# Patient Record
Sex: Female | Born: 1935 | Race: White | Hispanic: No | Marital: Married | Smoking: Never smoker
Health system: Southern US, Community
[De-identification: ages and names within clinical notes are randomized; demographics above are authoritative.]

## PROBLEM LIST (undated history)

## (undated) DIAGNOSIS — I82409 Acute embolism and thrombosis of unspecified deep veins of unspecified lower extremity: Secondary | ICD-10-CM

## (undated) DIAGNOSIS — D649 Anemia, unspecified: Secondary | ICD-10-CM

## (undated) DIAGNOSIS — I509 Heart failure, unspecified: Secondary | ICD-10-CM

## (undated) DIAGNOSIS — I10 Essential (primary) hypertension: Secondary | ICD-10-CM

## (undated) DIAGNOSIS — I4891 Unspecified atrial fibrillation: Secondary | ICD-10-CM

## (undated) DIAGNOSIS — I499 Cardiac arrhythmia, unspecified: Secondary | ICD-10-CM

## (undated) DIAGNOSIS — E785 Hyperlipidemia, unspecified: Secondary | ICD-10-CM

## (undated) DIAGNOSIS — E079 Disorder of thyroid, unspecified: Secondary | ICD-10-CM

## (undated) DIAGNOSIS — E039 Hypothyroidism, unspecified: Secondary | ICD-10-CM

## (undated) HISTORY — DX: Essential (primary) hypertension: I10

## (undated) HISTORY — PX: VAGINAL HYSTERECTOMY: SUR661

## (undated) HISTORY — DX: Acute embolism and thrombosis of unspecified deep veins of unspecified lower extremity: I82.409

## (undated) HISTORY — PX: APPENDECTOMY: SHX54

## (undated) HISTORY — DX: Hyperlipidemia, unspecified: E78.5

## (undated) HISTORY — DX: Anemia, unspecified: D64.9

## (undated) HISTORY — DX: Unspecified atrial fibrillation: I48.91

---

## 1997-08-03 ENCOUNTER — Ambulatory Visit (HOSPITAL_COMMUNITY): Admission: RE | Admit: 1997-08-03 | Discharge: 1997-08-03 | Payer: Self-pay | Admitting: Internal Medicine

## 1998-08-09 ENCOUNTER — Encounter: Payer: Self-pay | Admitting: Internal Medicine

## 1998-08-09 ENCOUNTER — Ambulatory Visit (HOSPITAL_COMMUNITY): Admission: RE | Admit: 1998-08-09 | Discharge: 1998-08-09 | Payer: Self-pay | Admitting: Internal Medicine

## 1999-08-10 ENCOUNTER — Encounter: Payer: Self-pay | Admitting: Internal Medicine

## 1999-08-10 ENCOUNTER — Ambulatory Visit (HOSPITAL_COMMUNITY): Admission: RE | Admit: 1999-08-10 | Discharge: 1999-08-10 | Payer: Self-pay | Admitting: Internal Medicine

## 1999-09-10 ENCOUNTER — Other Ambulatory Visit: Admission: RE | Admit: 1999-09-10 | Discharge: 1999-09-10 | Payer: Self-pay | Admitting: Otolaryngology

## 2000-08-22 ENCOUNTER — Ambulatory Visit (HOSPITAL_COMMUNITY): Admission: RE | Admit: 2000-08-22 | Discharge: 2000-08-22 | Payer: Self-pay | Admitting: Internal Medicine

## 2000-08-22 ENCOUNTER — Encounter: Payer: Self-pay | Admitting: Internal Medicine

## 2001-08-28 ENCOUNTER — Ambulatory Visit (HOSPITAL_COMMUNITY): Admission: RE | Admit: 2001-08-28 | Discharge: 2001-08-28 | Payer: Self-pay | Admitting: Internal Medicine

## 2001-08-28 ENCOUNTER — Encounter: Payer: Self-pay | Admitting: Internal Medicine

## 2002-09-14 ENCOUNTER — Ambulatory Visit (HOSPITAL_COMMUNITY): Admission: RE | Admit: 2002-09-14 | Discharge: 2002-09-14 | Payer: Self-pay | Admitting: Internal Medicine

## 2002-09-14 ENCOUNTER — Encounter: Payer: Self-pay | Admitting: Internal Medicine

## 2002-12-31 ENCOUNTER — Ambulatory Visit (HOSPITAL_COMMUNITY): Admission: RE | Admit: 2002-12-31 | Discharge: 2002-12-31 | Payer: Self-pay | Admitting: Gastroenterology

## 2003-09-16 ENCOUNTER — Ambulatory Visit (HOSPITAL_COMMUNITY): Admission: RE | Admit: 2003-09-16 | Discharge: 2003-09-16 | Payer: Self-pay | Admitting: Internal Medicine

## 2003-11-10 ENCOUNTER — Ambulatory Visit (HOSPITAL_COMMUNITY): Admission: RE | Admit: 2003-11-10 | Discharge: 2003-11-10 | Payer: Self-pay | Admitting: Internal Medicine

## 2004-09-21 ENCOUNTER — Ambulatory Visit (HOSPITAL_COMMUNITY): Admission: RE | Admit: 2004-09-21 | Discharge: 2004-09-21 | Payer: Self-pay | Admitting: Geriatric Medicine

## 2005-09-11 ENCOUNTER — Ambulatory Visit: Payer: Self-pay | Admitting: Cardiology

## 2005-09-13 ENCOUNTER — Ambulatory Visit: Payer: Self-pay | Admitting: Cardiology

## 2005-09-27 ENCOUNTER — Ambulatory Visit (HOSPITAL_COMMUNITY): Admission: RE | Admit: 2005-09-27 | Discharge: 2005-09-27 | Payer: Self-pay | Admitting: Urology

## 2005-10-09 ENCOUNTER — Ambulatory Visit: Payer: Self-pay | Admitting: Cardiology

## 2006-10-03 ENCOUNTER — Ambulatory Visit (HOSPITAL_COMMUNITY): Admission: RE | Admit: 2006-10-03 | Discharge: 2006-10-03 | Payer: Self-pay | Admitting: Geriatric Medicine

## 2007-10-09 ENCOUNTER — Ambulatory Visit (HOSPITAL_COMMUNITY): Admission: RE | Admit: 2007-10-09 | Discharge: 2007-10-09 | Payer: Self-pay | Admitting: Geriatric Medicine

## 2007-10-26 ENCOUNTER — Other Ambulatory Visit: Admission: RE | Admit: 2007-10-26 | Discharge: 2007-10-26 | Payer: Self-pay | Admitting: Obstetrics and Gynecology

## 2008-10-14 ENCOUNTER — Ambulatory Visit (HOSPITAL_COMMUNITY): Admission: RE | Admit: 2008-10-14 | Discharge: 2008-10-14 | Payer: Self-pay | Admitting: Geriatric Medicine

## 2008-10-27 ENCOUNTER — Other Ambulatory Visit: Admission: RE | Admit: 2008-10-27 | Discharge: 2008-10-27 | Payer: Self-pay | Admitting: Obstetrics and Gynecology

## 2009-10-20 ENCOUNTER — Ambulatory Visit (HOSPITAL_COMMUNITY): Admission: RE | Admit: 2009-10-20 | Discharge: 2009-10-20 | Payer: Self-pay | Admitting: Geriatric Medicine

## 2010-01-21 HISTORY — PX: OTHER SURGICAL HISTORY: SHX169

## 2010-06-08 NOTE — Assessment & Plan Note (Signed)
[facility]                              CARDIOLOGY OFFICE NOTE   Paula [name], Paula [name]                    MRN:          [id number]  DATE:09/11/2005                            DOB:          [birth year]    PRIMARY CARE PHYSICIAN:  Dr. Merlene Laughter.   REASON FOR PRESENTATION:  Patient with hypertension.   HISTORY OF PRESENT ILLNESS:  Patient called to add herself to the schedule  as a new patient today.  She is 75 years old.  She has not had any cardiac  problems in the past.  However, she just did not feel well yesterday.  She  did not go to the [facility] to exercise because she was fatigued.  She felt like  maybe she had a head cold.  She has had a cough for 2 or 3 days, it had been  non-productive.  She has not had any new shortness of breath.  She has had  no PND, or orthopnea.  She has not noticed any palpitations.  She has no  chest discomfort, neck discomfort, arm discomfort, activity induced nausea,  vomiting, excessive diaphoresis.   However, because she did not feel well she took her blood pressure.  She  noted it to be in the 190s/100 range.  She came in today with her mother-in-  law who I see frequently.  She had her blood pressure verified by our  nurses.  She is now added to my schedule.  She had no other symptoms as  above.   PAST MEDICAL HISTORY:  Hypothyroidism.   PAST SURGICAL HISTORY:  Appendectomy, hysterectomy.   ALLERGIES:  ADHESIVES.   CURRENT MEDICATIONS:  1. Synthroid 0.05 mg q. day.  2. Fish oil 1200 mg q. day.  3. Flax-seed oil.  4. Calcium.  5. Aspirin 81 mg q. day.  6. Omeprazole.   SOCIAL HISTORY:  This patient is retired.  She is married with just one  child.  She does not drink alcohol except for a glass of wine occasionally.  She does not smoke cigarettes.   FAMILY HISTORY:  Non-contributory for early coronary artery disease.  Her  mother did die of a probable myocardial infarction at age 49.   REVIEW OF  SYSTEMS:  As stated in the HPI.  Positive for reflux, ulcer,  hiatal hernia.  Negative for other systems.   PHYSICAL EXAMINATION:  GENERAL:  The patient is in no distress.  VITAL SIGNS:  Blood pressure 192/92, heart beat 89 and regular.  Weight 148  pounds.  Body mass index 25.  HEENT:  Eyes unremarkable, pupils equal, round, reactive to light.  Fundi  within normal limits.  Oral mucosa unremarkable.  NECK:  No jugular venous distention.  Waveform within normal limits, carotid  upstroke brisk and symmetric.  No bruits, thyromegaly.  LYMPHATICS:  No cervical, axillary, or inguinal adenopathy.  LUNGS:  Clear to auscultation bilaterally.  BACK:  No costovertebral midline tenderness.  CHEST:  Unremarkable.  HEART:  PMI not displaced or sustained.  S1 and S2 within normal limits.  No  S3, no S4.  No  murmurs.  ABDOMEN:  Flat, positive bowel sounds, normal in frequency and pitch, no  bruits, no rebound, guarding or midline pulsatile mass.  No hepatomegaly or  splenomegaly.  SKIN:  No rashes, no nodules.  EXTREMITIES:  2+ pulses throughout.  No edema.  No cyanosis, no clubbing.  NEURO:  Oriented to person, place, time.  Cranial nerves II-XII grossly  intact, motor grossly intact.   EKG:  Sinus rhythm, rate 89, short P-R interval, nonspecific T-wave  flattening.   ASSESSMENT/PLAN:  1. Hypertension.  Patient has new-onset hypertension.  She is otherwise      doing relatively well except for some mild congestion in her head and a      very slight cough.  Her physical exam is unremarkable.  She does not      appear to have any acute secondary causes.  Her blood pressure is equal      in both arms.  However, it has been verified several times today as she      came here this morning with her mother-in-law.  It is still elevated.      She might be somewhat anxious about this which may be driving the      pressure.  However, I do think that at this point I am obligated to      treat it even  though it is a first time reading.  I am going to add      Norvasc 2.5 mg q. day.  She is going to keep a blood pressure diary and      let me know how this responds.  I am also going to get blood work to      include a CMET, TSH and urinalysis.  2. Followup.  I would like to see her back in four weeks.                               Rollene Rotunda, MD, [facility]    JH/MedQ  DD:  09/11/2005  DT:  09/12/2005  Job #:  [id number]   cc:   Hal T. Pete Glatter, MD

## 2010-06-08 NOTE — Op Note (Signed)
NAME:  Paula [name], ARRAMBIDE                       ACCOUNT NO.:  [id number]   MEDICAL RECORD NO.:  [id number]                   PATIENT TYPE:  AMB   LOCATION:  ENDO                                 FACILITY:  [facility]   PHYSICIAN:  Danise Edge, M.D.                DATE OF BIRTH:  23-Jun-1935   DATE OF PROCEDURE:  12/31/2002  DATE OF DISCHARGE:                                 OPERATIVE REPORT   PROCEDURE:  Screening colonoscopy.   INDICATIONS FOR PROCEDURE:  [name] is a 75 year old female born  01-21-1936.  Ms. Chauca is scheduled to undergo her first screening  colonoscopy with polypectomy to prevent colon cancer.   ENDOSCOPIST:  Danise Edge, M.D.   PREMEDICATION:  Versed 5 mg, Demerol 50 mg.   DESCRIPTION OF PROCEDURE:  After obtaining informed consent, Mr. Eagles was  placed in the left lateral decubitus position. I administered intravenous  Demerol and intravenous Versed to achieve conscious sedation for the  procedure. The patient's blood pressure, oxygen saturation and cardiac  rhythm were monitored throughout the procedure and documented in the medical  record.   Anal inspection was normal. Digital rectal exam was normal. The Olympus  adjustable pediatric colonoscope was introduced into the rectum and advanced  to the cecum. Colonic preparation for the exam today was satisfactory.   RECTUM:  Normal.   SIGMOID COLON AND DESCENDING COLON:  Normal.   SPLENIC FLEXURE:  Normal.   TRANSVERSE COLON:  Normal.   HEPATIC FLEXURE:  Normal.   ASCENDING COLON:  Normal.   CECUM AND ILEOCECAL VALVE:  Normal.   ASSESSMENT:  Normal screening proctocolonoscopy to the cecum.  No endoscopic  evidence for the presence of colorectal neoplasia.                                               Danise Edge, M.D.    MJ/MEDQ  D:  12/31/2002  T:  01/01/2003  Job:  [id number]   cc:   Ike Bene, M.D.  [address]  [place]  [place] [address]  Fax:  [phone]

## 2010-06-08 NOTE — Assessment & Plan Note (Signed)
[facility]                              CARDIOLOGY OFFICE NOTE   Paula, [name]                    MRN:          [id number]  DATE:10/09/2005                            DOB:          [birth year]    PRIMARY CARE PHYSICIAN:  Hal T. Stoneking, M.D.   REASON FOR PRESENTATION:  Evaluate patient with hypertension.   HISTORY OF PRESENT ILLNESS:  Patient presents for followup.  I saw her with  exacerbation of blood pressure that was new to her.  This was on August  22nd.  I started her on Norvasc 2.5 mg daily.  She has tolerated this  medicine.  She has done well with this and has had no recurrent symptoms.  In particular, she has had no headaches or dizziness.  She did have blood  work that included a normal chemistry and urinalysis.  Her TSH was also  normal.   PAST MEDICAL HISTORY:  1. Hypothyroidism.  2. New hypertension.  3. Appendectomy.  4. Hysterectomy.   ALLERGIES:  ADHESIVES.   MEDICATIONS:  1. Synthroid 0.05 mg daily.  2. Fish oil.  3. Flaxseed oil.  4. Calcium plus.  5. Aspirin 81 mg daily.  6. Norvasc 2.5 mg daily.  7. Omeprazole.   REVIEW OF SYSTEMS:  As stated in the HPI, otherwise negative for other  systems.   PHYSICAL EXAMINATION:  GENERAL:  Patient is in no distress.  VITAL SIGNS:  Blood pressure 140/78, heart rate 67 and regular.  HEENT:  Eyelids unremarkable.  Pupils are equal, round and reactive to  light.  Fundi no visualized.  Oral mucosa unremarkable.  NECK:  No jugular venous distention.  Wave form within normal limits.  Carotid upstroke brisk and symmetric.  No bruits, no thyromegaly.  LYMPHATICS:  No cervical, axillary, or inguinal adenopathy.  LUNGS:  Clear to auscultation.  HEART:  PMI not displaced or sustained.  S1 and S2 within normal limits.  No  S3, no S4, no murmurs.  ABDOMEN:  Flat, positive bowel sounds.  Normal in frequency and pitch.  No  bruits, rebound, guarding.  There are no midline  pulsatile masses.  No  organomegaly.  SKIN:  No rashes, no nodules.  EXTREMITIES:  Pulses 2+ throughout.  No cyanosis, no clubbing, no edema.  NEUROLOGIC:  Grossly intact.   ASSESSMENT/PLAN:  1. Hypertension:  The patient does not appear to have a secondary cause.      Her blood pressure seems to be well controlled, though at the upper      limits of normal today.  She is tolerating the Norvasc.  At this point,      she will continue this with no further cardiovascular testing.  We      discussed therapeutic lifestyle changes (TLC) that can aid with blood      pressure control.  2. Followup:  She is due to see Dr. Pete Glatter soon.  She can come back to      this clinic as needed.  Rollene Rotunda, MD, [facility]    JH/MedQ  DD:  10/09/2005  DT:  10/10/2005  Job #:  [id number]   cc:   Hal T. Stoneking, M.D.

## 2010-09-27 ENCOUNTER — Other Ambulatory Visit (HOSPITAL_COMMUNITY): Payer: Self-pay | Admitting: Geriatric Medicine

## 2010-09-27 DIAGNOSIS — Z1231 Encounter for screening mammogram for malignant neoplasm of breast: Secondary | ICD-10-CM

## 2010-09-28 ENCOUNTER — Other Ambulatory Visit (HOSPITAL_COMMUNITY)
Admission: RE | Admit: 2010-09-28 | Discharge: 2010-09-28 | Disposition: A | Discharge status: Home / Self Care | Payer: PRIVATE HEALTH INSURANCE | Source: Ambulatory Visit | Attending: Otolaryngology | Admitting: Otolaryngology

## 2010-09-28 DIAGNOSIS — R220 Localized swelling, mass and lump, head: Secondary | ICD-10-CM | POA: Insufficient documentation

## 2010-10-11 ENCOUNTER — Other Ambulatory Visit: Payer: Self-pay | Admitting: Otolaryngology

## 2010-10-11 DIAGNOSIS — D37039 Neoplasm of uncertain behavior of the major salivary glands, unspecified: Secondary | ICD-10-CM

## 2010-10-15 ENCOUNTER — Ambulatory Visit
Admission: RE | Admit: 2010-10-15 | Discharge: 2010-10-15 | Disposition: A | Discharge status: Home / Self Care | Payer: PRIVATE HEALTH INSURANCE | Source: Ambulatory Visit | Attending: Otolaryngology | Admitting: Otolaryngology

## 2010-10-15 DIAGNOSIS — D37039 Neoplasm of uncertain behavior of the major salivary glands, unspecified: Secondary | ICD-10-CM

## 2010-10-15 MED ORDER — IOHEXOL 300 MG/ML  SOLN
75.0000 mL | Freq: Once | INTRAMUSCULAR | Status: AC | PRN
  Administered 2010-10-15: 75 mL via INTRAVENOUS

## 2010-10-22 ENCOUNTER — Ambulatory Visit (HOSPITAL_COMMUNITY)
Admission: RE | Admit: 2010-10-22 | Discharge: 2010-10-22 | Disposition: A | Discharge status: Home / Self Care | Payer: PRIVATE HEALTH INSURANCE | Source: Ambulatory Visit | Attending: Geriatric Medicine | Admitting: Geriatric Medicine

## 2010-10-22 DIAGNOSIS — Z1231 Encounter for screening mammogram for malignant neoplasm of breast: Secondary | ICD-10-CM | POA: Insufficient documentation

## 2011-01-31 ENCOUNTER — Other Ambulatory Visit: Payer: Self-pay | Admitting: Orthopedic Surgery

## 2011-01-31 DIAGNOSIS — M25562 Pain in left knee: Secondary | ICD-10-CM

## 2011-02-01 ENCOUNTER — Ambulatory Visit
Admission: RE | Admit: 2011-02-01 | Discharge: 2011-02-01 | Disposition: A | Discharge status: Home / Self Care | Payer: PRIVATE HEALTH INSURANCE | Source: Ambulatory Visit | Attending: Orthopedic Surgery | Admitting: Orthopedic Surgery

## 2011-02-01 DIAGNOSIS — M25562 Pain in left knee: Secondary | ICD-10-CM

## 2011-09-26 ENCOUNTER — Other Ambulatory Visit (HOSPITAL_COMMUNITY): Payer: Self-pay | Admitting: Geriatric Medicine

## 2011-09-26 DIAGNOSIS — Z1231 Encounter for screening mammogram for malignant neoplasm of breast: Secondary | ICD-10-CM

## 2011-10-24 ENCOUNTER — Ambulatory Visit (HOSPITAL_COMMUNITY)
Admission: RE | Admit: 2011-10-24 | Discharge: 2011-10-24 | Disposition: A | Discharge status: Home / Self Care | Payer: Medicare Other | Source: Ambulatory Visit | Attending: Geriatric Medicine | Admitting: Geriatric Medicine

## 2011-10-24 DIAGNOSIS — Z1231 Encounter for screening mammogram for malignant neoplasm of breast: Secondary | ICD-10-CM | POA: Insufficient documentation

## 2012-09-23 ENCOUNTER — Other Ambulatory Visit (HOSPITAL_COMMUNITY): Payer: Self-pay | Admitting: Geriatric Medicine

## 2012-09-23 DIAGNOSIS — Z1231 Encounter for screening mammogram for malignant neoplasm of breast: Secondary | ICD-10-CM

## 2012-10-28 ENCOUNTER — Ambulatory Visit (HOSPITAL_COMMUNITY)
Admission: RE | Admit: 2012-10-28 | Discharge: 2012-10-28 | Disposition: A | Discharge status: Home / Self Care | Payer: Medicare Other | Source: Ambulatory Visit | Attending: Geriatric Medicine | Admitting: Geriatric Medicine

## 2012-10-28 DIAGNOSIS — Z1231 Encounter for screening mammogram for malignant neoplasm of breast: Secondary | ICD-10-CM | POA: Insufficient documentation

## 2013-09-21 ENCOUNTER — Other Ambulatory Visit (HOSPITAL_COMMUNITY): Payer: Self-pay | Admitting: Geriatric Medicine

## 2013-09-21 DIAGNOSIS — Z803 Family history of malignant neoplasm of breast: Secondary | ICD-10-CM

## 2013-11-08 ENCOUNTER — Other Ambulatory Visit (HOSPITAL_COMMUNITY): Payer: Self-pay | Admitting: Geriatric Medicine

## 2013-11-08 ENCOUNTER — Ambulatory Visit (HOSPITAL_COMMUNITY)
Admission: RE | Admit: 2013-11-08 | Discharge: 2013-11-08 | Disposition: A | Discharge status: Home / Self Care | Payer: Medicare HMO | Source: Ambulatory Visit | Attending: Geriatric Medicine | Admitting: Geriatric Medicine

## 2013-11-08 DIAGNOSIS — Z1231 Encounter for screening mammogram for malignant neoplasm of breast: Secondary | ICD-10-CM

## 2013-11-08 DIAGNOSIS — Z803 Family history of malignant neoplasm of breast: Secondary | ICD-10-CM

## 2014-05-16 DIAGNOSIS — Z79899 Other long term (current) drug therapy: Secondary | ICD-10-CM | POA: Diagnosis not present

## 2014-05-16 DIAGNOSIS — E780 Pure hypercholesterolemia: Secondary | ICD-10-CM | POA: Diagnosis not present

## 2014-07-15 DIAGNOSIS — I10 Essential (primary) hypertension: Secondary | ICD-10-CM | POA: Diagnosis not present

## 2014-07-15 DIAGNOSIS — M8589 Other specified disorders of bone density and structure, multiple sites: Secondary | ICD-10-CM | POA: Diagnosis not present

## 2014-07-15 DIAGNOSIS — M720 Palmar fascial fibromatosis [Dupuytren]: Secondary | ICD-10-CM | POA: Diagnosis not present

## 2014-07-18 DIAGNOSIS — Z961 Presence of intraocular lens: Secondary | ICD-10-CM | POA: Diagnosis not present

## 2014-07-18 DIAGNOSIS — H52222 Regular astigmatism, left eye: Secondary | ICD-10-CM | POA: Diagnosis not present

## 2014-07-18 DIAGNOSIS — H524 Presbyopia: Secondary | ICD-10-CM | POA: Diagnosis not present

## 2014-07-18 DIAGNOSIS — H5210 Myopia, unspecified eye: Secondary | ICD-10-CM | POA: Diagnosis not present

## 2014-08-09 DIAGNOSIS — L570 Actinic keratosis: Secondary | ICD-10-CM | POA: Diagnosis not present

## 2014-08-15 DIAGNOSIS — M8589 Other specified disorders of bone density and structure, multiple sites: Secondary | ICD-10-CM | POA: Diagnosis not present

## 2014-08-15 DIAGNOSIS — M899 Disorder of bone, unspecified: Secondary | ICD-10-CM | POA: Diagnosis not present

## 2014-08-22 DIAGNOSIS — M65331 Trigger finger, right middle finger: Secondary | ICD-10-CM | POA: Diagnosis not present

## 2014-08-22 DIAGNOSIS — M65332 Trigger finger, left middle finger: Secondary | ICD-10-CM | POA: Diagnosis not present

## 2014-09-22 DIAGNOSIS — M65331 Trigger finger, right middle finger: Secondary | ICD-10-CM | POA: Diagnosis not present

## 2014-09-22 DIAGNOSIS — M65332 Trigger finger, left middle finger: Secondary | ICD-10-CM | POA: Diagnosis not present

## 2014-10-17 ENCOUNTER — Other Ambulatory Visit: Payer: Self-pay

## 2014-10-17 DIAGNOSIS — Z1231 Encounter for screening mammogram for malignant neoplasm of breast: Secondary | ICD-10-CM

## 2014-11-10 DIAGNOSIS — L900 Lichen sclerosus et atrophicus: Secondary | ICD-10-CM | POA: Diagnosis not present

## 2014-11-17 ENCOUNTER — Ambulatory Visit
Admission: RE | Admit: 2014-11-17 | Discharge: 2014-11-17 | Disposition: A | Discharge status: Home / Self Care | Payer: Commercial Managed Care - HMO | Source: Ambulatory Visit

## 2014-11-17 DIAGNOSIS — Z1231 Encounter for screening mammogram for malignant neoplasm of breast: Secondary | ICD-10-CM | POA: Diagnosis not present

## 2015-01-25 DIAGNOSIS — M65331 Trigger finger, right middle finger: Secondary | ICD-10-CM | POA: Diagnosis not present

## 2015-01-25 DIAGNOSIS — M65332 Trigger finger, left middle finger: Secondary | ICD-10-CM | POA: Diagnosis not present

## 2015-01-30 DIAGNOSIS — E039 Hypothyroidism, unspecified: Secondary | ICD-10-CM | POA: Diagnosis not present

## 2015-01-30 DIAGNOSIS — Z0000 Encounter for general adult medical examination without abnormal findings: Secondary | ICD-10-CM | POA: Diagnosis not present

## 2015-01-30 DIAGNOSIS — Z79899 Other long term (current) drug therapy: Secondary | ICD-10-CM | POA: Diagnosis not present

## 2015-01-30 DIAGNOSIS — Z23 Encounter for immunization: Secondary | ICD-10-CM | POA: Diagnosis not present

## 2015-01-30 DIAGNOSIS — Z1389 Encounter for screening for other disorder: Secondary | ICD-10-CM | POA: Diagnosis not present

## 2015-01-30 DIAGNOSIS — I10 Essential (primary) hypertension: Secondary | ICD-10-CM | POA: Diagnosis not present

## 2015-01-30 DIAGNOSIS — E7800 Pure hypercholesterolemia, unspecified: Secondary | ICD-10-CM | POA: Diagnosis not present

## 2015-02-07 DIAGNOSIS — E039 Hypothyroidism, unspecified: Secondary | ICD-10-CM | POA: Diagnosis not present

## 2015-02-28 DIAGNOSIS — M65332 Trigger finger, left middle finger: Secondary | ICD-10-CM | POA: Diagnosis not present

## 2015-02-28 DIAGNOSIS — M65331 Trigger finger, right middle finger: Secondary | ICD-10-CM | POA: Diagnosis not present

## 2015-03-06 ENCOUNTER — Encounter (HOSPITAL_COMMUNITY): Payer: Self-pay | Admitting: Emergency Medicine

## 2015-03-06 ENCOUNTER — Emergency Department (HOSPITAL_COMMUNITY)
Admission: EM | Admit: 2015-03-06 | Discharge: 2015-03-06 | Disposition: A | Discharge status: Home / Self Care | Payer: Commercial Managed Care - HMO | Attending: Emergency Medicine | Admitting: Emergency Medicine

## 2015-03-06 ENCOUNTER — Emergency Department (HOSPITAL_COMMUNITY): Payer: Commercial Managed Care - HMO

## 2015-03-06 DIAGNOSIS — E079 Disorder of thyroid, unspecified: Secondary | ICD-10-CM | POA: Insufficient documentation

## 2015-03-06 DIAGNOSIS — Z7982 Long term (current) use of aspirin: Secondary | ICD-10-CM | POA: Insufficient documentation

## 2015-03-06 DIAGNOSIS — Z79899 Other long term (current) drug therapy: Secondary | ICD-10-CM | POA: Insufficient documentation

## 2015-03-06 DIAGNOSIS — R55 Syncope and collapse: Secondary | ICD-10-CM | POA: Insufficient documentation

## 2015-03-06 DIAGNOSIS — R61 Generalized hyperhidrosis: Secondary | ICD-10-CM | POA: Insufficient documentation

## 2015-03-06 DIAGNOSIS — R531 Weakness: Secondary | ICD-10-CM | POA: Diagnosis not present

## 2015-03-06 DIAGNOSIS — Z8679 Personal history of other diseases of the circulatory system: Secondary | ICD-10-CM | POA: Insufficient documentation

## 2015-03-06 DIAGNOSIS — R05 Cough: Secondary | ICD-10-CM | POA: Diagnosis not present

## 2015-03-06 HISTORY — DX: Disorder of thyroid, unspecified: E07.9

## 2015-03-06 HISTORY — DX: Cardiac arrhythmia, unspecified: I49.9

## 2015-03-06 LAB — URINE MICROSCOPIC-ADD ON

## 2015-03-06 LAB — BASIC METABOLIC PANEL
Anion gap: 13 (ref 5–15)
BUN: 11 mg/dL (ref 6–20)
CALCIUM: 8.7 mg/dL — AB (ref 8.9–10.3)
CHLORIDE: 96 mmol/L — AB (ref 101–111)
CO2: 22 mmol/L (ref 22–32)
CREATININE: 0.9 mg/dL (ref 0.44–1.00)
GFR calc Af Amer: >60 mL/min (ref >60)
GFR calc non Af Amer: >60 mL/min (ref >60)
GLUCOSE: 120 mg/dL — AB (ref 65–99)
Potassium: 4.4 mmol/L (ref 3.5–5.1)
Sodium: 131 mmol/L — ABNORMAL LOW (ref 135–145)

## 2015-03-06 LAB — URINALYSIS, ROUTINE W REFLEX MICROSCOPIC
BILIRUBIN URINE: NEGATIVE
GLUCOSE, UA: NEGATIVE mg/dL
HGB URINE DIPSTICK: NEGATIVE
Ketones, ur: 15 mg/dL — AB
Leukocytes, UA: NEGATIVE
Nitrite: NEGATIVE
Protein, ur: 30 mg/dL — AB
SPECIFIC GRAVITY, URINE: 1.023 (ref 1.005–1.030)
pH: 7 (ref 5.0–8.0)

## 2015-03-06 LAB — CBC
HCT: 42.8 % (ref 36.0–46.0)
HEMOGLOBIN: 13.8 g/dL (ref 12.0–15.0)
MCH: 30.9 pg (ref 26.0–34.0)
MCHC: 32.2 g/dL (ref 30.0–36.0)
MCV: 95.7 fL (ref 78.0–100.0)
PLATELETS: 201 10*3/uL (ref 150–400)
RBC: 4.47 MIL/uL (ref 3.87–5.11)
RDW: 13.3 % (ref 11.5–15.5)
WBC: 10.2 10*3/uL (ref 4.0–10.5)

## 2015-03-06 LAB — CBG MONITORING, ED: GLUCOSE-CAPILLARY: 106 mg/dL — AB (ref 65–99)

## 2015-03-06 LAB — I-STAT TROPONIN, ED: Troponin i, poc: 0 ng/mL (ref 0.00–0.08)

## 2015-03-06 MED ORDER — SODIUM CHLORIDE 0.9 % IV BOLUS (SEPSIS)
1000.0000 mL | Freq: Once | INTRAVENOUS | Status: AC
  Administered 2015-03-06: 1000 mL via INTRAVENOUS

## 2015-03-06 NOTE — ED Notes (Signed)
EKG completed given to EDP.  

## 2015-03-06 NOTE — ED Notes (Signed)
Onset today syncope x2 standing both times witnessed by husband syncope lasting 1 minute each time reported by husband to EMS. Denies any trauma alert answering and following commands appropriate upon arrival.

## 2015-03-06 NOTE — ED Provider Notes (Signed)
CSN: [id number]     Arrival date & time 03/06/15  0930 History   First MD Initiated Contact with Patient 03/06/15 1006     Chief Complaint  Patient presents with  . Loss of Consciousness     (Consider location/radiation/quality/duration/timing/severity/associated sxs/prior Treatment) Patient is a 80 y.o. female presenting with syncope.  Loss of Consciousness  Paula [name] is a 80 y.o. female with PMH significant for thyroid disease who presents with 2 syncopal episodes this morning each lasting approximately 1 minute. Asian states that she was standing in her kitchen this morning when she suddenly came dizzy, diaphoretic, and generally weak and then remembers waking up on the kitchen floor. The first episode was unwitnessed. The second episode occurred when she was standing and she became dizzy, diaphoretic and generally weak again and her husband assisted her to the floor. He reports she was slightly disoriented upon wakening. Denies bowel or bladder incontinence or shaking. She denies any recent medication changes. No history of seizures. She states that she has been sick with a "bug" this past weekend with symptoms including sore throat, cough, low-grade fever, generalized headache, chest tightness, and nausea. She reports decreased by mouth intake. She denies shortness of breath, vomiting, urinary symptoms, or bloody stools.   Past Medical History  Diagnosis Date  . Irregular heart rate   . Thyroid disease    History reviewed. No pertinent past surgical history. No family history on file. Social History  Substance Use Topics  . Smoking status: Never Smoker   . Smokeless tobacco: None  . Alcohol Use: Yes   OB History    No data available     Review of Systems  Cardiovascular: Positive for syncope.  All other systems negative unless otherwise stated in HPI     Allergies  Review of patient's allergies indicates no known allergies.  Home Medications   Prior to  Admission medications   Medication Sig Start Date End Date Taking? Authorizing Provider  amLODipine (NORVASC) 5 MG tablet Take 5 mg by mouth daily.   Yes Historical Provider, MD  aspirin EC 81 MG tablet Take 81 mg by mouth daily.   Yes Historical Provider, MD  atorvastatin (LIPITOR) 10 MG tablet Take 10 mg by mouth daily at 6 PM.   Yes Historical Provider, MD  calcium carbonate (OS-CAL - DOSED IN MG OF ELEMENTAL CALCIUM) 1250 (500 Ca) MG tablet Take 1 tablet by mouth daily with breakfast.   Yes Historical Provider, MD  levothyroxine (SYNTHROID, LEVOTHROID) 88 MCG tablet Take 88 mcg by mouth daily before breakfast.   Yes Historical Provider, MD  Multiple Vitamin (MULTIVITAMIN WITH MINERALS) TABS tablet Take 1 tablet by mouth daily.   Yes Historical Provider, MD   BP 90/62 mmHg  Pulse 90  Temp(Src) 98.8 F (37.1 C) (Oral)  Resp 18  Ht 5\' 4"  (1.626 m)  Wt 63.05 kg  BMI 23.85 kg/m2  SpO2 98% Physical Exam  Constitutional: She is oriented to person, place, and time. She appears well-developed and well-nourished.  Non-toxic appearance. She does not have a sickly appearance. She does not appear ill.  HENT:  Head: Normocephalic and atraumatic.  Mouth/Throat: Oropharynx is clear and moist.  Eyes: Conjunctivae are normal. Pupils are equal, round, and reactive to light.  Neck: Normal range of motion. Neck supple.  No cervical midline tenderness.   Cardiovascular: Normal rate, regular rhythm and normal heart sounds.   No murmur heard. Pulmonary/Chest: Effort normal and breath sounds normal. No accessory muscle  usage or stridor. No respiratory distress. She has no wheezes. She has no rhonchi. She has no rales.  Abdominal: Soft. Bowel sounds are normal. She exhibits no distension. There is no tenderness.  Musculoskeletal: Normal range of motion.  Lymphadenopathy:    She has no cervical adenopathy.  Neurological: She is alert and oriented to person, place, and time.  Mental Status:   AOx3.   Speech clear without dysarthria. Cranial Nerves:  I-not tested  II-PERRLA  III, IV, VI-EOMs intact  V-temporal and masseter strength intact  VII-symmetrical facial movements intact, no facial droop  VIII-hearing grossly intact bilaterally  IX, X-gag intact  XI-strength of sternomastoid and trapezius muscles 5/5  XII-tongue midline Motor:   Good muscle bulk and tone  Strength 5/5 bilaterally in upper and lower extremities   Cerebellar--intact RAMs, finger to nose intact bilaterally.  Gait normal  No pronator drift Sensory:  Intact in upper and lower extremities   Skin: Skin is warm and dry.  No signs of trauma.   Psychiatric: She has a normal mood and affect. Her behavior is normal.    ED Course  Procedures (including critical care time) Labs Review Labs Reviewed  BASIC METABOLIC PANEL - Abnormal; Notable for the following:    Sodium 131 (*)    Chloride 96 (*)    Glucose, Bld 120 (*)    Calcium 8.7 (*)    All other components within normal limits  CBG MONITORING, ED - Abnormal; Notable for the following:    Glucose-Capillary 106 (*)    All other components within normal limits  CBC  URINALYSIS, ROUTINE W REFLEX MICROSCOPIC (NOT AT [facility]  Randolm Idol, ED    Imaging Review Dg Chest 2 View  03/06/2015  CLINICAL DATA:  Cough and syncope EXAM: CHEST  2 VIEW COMPARISON:  None. FINDINGS: Normal heart size and mediastinal contours. No acute infiltrate or edema. No effusion or pneumothorax. Upper thoracic compression fracture with moderate height loss, likely T5. History favors chronicity. IMPRESSION: No evidence of acute cardiopulmonary disease. Electronically Signed   By: Monte Fantasia M.D.   On: 03/06/2015 10:59   I have personally reviewed and evaluated these images and lab results as part of my medical decision-making.   EKG Interpretation None      MDM   Final diagnoses:  Syncope, unspecified syncope type    Patient presents with 2 syncopal episodes this  morning. VSS, NAD. Normal neuro exam. Lantus obtain chest x-ray, troponin, BNP, CBC, UA, CBG, and EKG.  CXR normal. EKG without acute changes.  Labs without acute abnormalities. I suspect this is related to decreased PO intake over the past couple of days.  Doubt ACS, arrhythmia, or aortic dissection.  Doubt stroke/TIA.  Doubt PE.  Low risk syncope per [facility] Syncope Rule. Will administer IVF and ambualte.  Anticipate discharge home with PCP follow up.  Discussed return precautions.  Patient agrees and acknowledges the above plan for discharge.  Case has been discussed with and seen Dr. Venora Maples who agrees with the above plan for discharge.        Gloriann Loan, PA-C 03/06/15 1342  Gloriann Loan, PA-C 03/06/15 [place], MD 03/06/15 [phone]

## 2015-03-06 NOTE — Discharge Instructions (Signed)

## 2015-03-06 NOTE — ED Notes (Signed)
While ambulating pt, pt's O2 was between 98% and 100%. Pt stated that "she feels much better". Informed Hannah - RN.

## 2015-03-09 DIAGNOSIS — I10 Essential (primary) hypertension: Secondary | ICD-10-CM | POA: Diagnosis not present

## 2015-03-09 DIAGNOSIS — R55 Syncope and collapse: Secondary | ICD-10-CM | POA: Diagnosis not present

## 2015-03-09 DIAGNOSIS — E871 Hypo-osmolality and hyponatremia: Secondary | ICD-10-CM | POA: Diagnosis not present

## 2015-07-28 DIAGNOSIS — R69 Illness, unspecified: Secondary | ICD-10-CM | POA: Diagnosis not present

## 2015-07-28 DIAGNOSIS — H3589 Other specified retinal disorders: Secondary | ICD-10-CM | POA: Diagnosis not present

## 2015-07-28 DIAGNOSIS — Z961 Presence of intraocular lens: Secondary | ICD-10-CM | POA: Diagnosis not present

## 2015-07-31 DIAGNOSIS — I10 Essential (primary) hypertension: Secondary | ICD-10-CM | POA: Diagnosis not present

## 2015-08-08 DIAGNOSIS — L570 Actinic keratosis: Secondary | ICD-10-CM | POA: Diagnosis not present

## 2015-08-08 DIAGNOSIS — Z85828 Personal history of other malignant neoplasm of skin: Secondary | ICD-10-CM | POA: Diagnosis not present

## 2015-10-23 ENCOUNTER — Other Ambulatory Visit: Payer: Self-pay | Admitting: Geriatric Medicine

## 2015-10-23 DIAGNOSIS — Z1231 Encounter for screening mammogram for malignant neoplasm of breast: Secondary | ICD-10-CM

## 2015-10-23 DIAGNOSIS — Z803 Family history of malignant neoplasm of breast: Secondary | ICD-10-CM

## 2015-11-14 DIAGNOSIS — L900 Lichen sclerosus et atrophicus: Secondary | ICD-10-CM | POA: Diagnosis not present

## 2015-11-14 DIAGNOSIS — Z01419 Encounter for gynecological examination (general) (routine) without abnormal findings: Secondary | ICD-10-CM | POA: Diagnosis not present

## 2015-11-20 ENCOUNTER — Ambulatory Visit
Admission: RE | Admit: 2015-11-20 | Discharge: 2015-11-20 | Disposition: A | Discharge status: Home / Self Care | Payer: Commercial Managed Care - HMO | Source: Ambulatory Visit | Attending: Geriatric Medicine | Admitting: Geriatric Medicine

## 2015-11-20 DIAGNOSIS — Z1231 Encounter for screening mammogram for malignant neoplasm of breast: Secondary | ICD-10-CM | POA: Diagnosis not present

## 2015-11-20 DIAGNOSIS — Z803 Family history of malignant neoplasm of breast: Secondary | ICD-10-CM

## 2016-03-14 DIAGNOSIS — Z0000 Encounter for general adult medical examination without abnormal findings: Secondary | ICD-10-CM | POA: Diagnosis not present

## 2016-03-14 DIAGNOSIS — E7800 Pure hypercholesterolemia, unspecified: Secondary | ICD-10-CM | POA: Diagnosis not present

## 2016-03-14 DIAGNOSIS — Z1389 Encounter for screening for other disorder: Secondary | ICD-10-CM | POA: Diagnosis not present

## 2016-03-14 DIAGNOSIS — Z23 Encounter for immunization: Secondary | ICD-10-CM | POA: Diagnosis not present

## 2016-03-14 DIAGNOSIS — K219 Gastro-esophageal reflux disease without esophagitis: Secondary | ICD-10-CM | POA: Diagnosis not present

## 2016-03-14 DIAGNOSIS — I10 Essential (primary) hypertension: Secondary | ICD-10-CM | POA: Diagnosis not present

## 2016-03-14 DIAGNOSIS — M8580 Other specified disorders of bone density and structure, unspecified site: Secondary | ICD-10-CM | POA: Diagnosis not present

## 2016-03-14 DIAGNOSIS — E039 Hypothyroidism, unspecified: Secondary | ICD-10-CM | POA: Diagnosis not present

## 2016-03-15 DIAGNOSIS — I10 Essential (primary) hypertension: Secondary | ICD-10-CM | POA: Diagnosis not present

## 2016-03-15 DIAGNOSIS — E039 Hypothyroidism, unspecified: Secondary | ICD-10-CM | POA: Diagnosis not present

## 2016-03-15 DIAGNOSIS — E7800 Pure hypercholesterolemia, unspecified: Secondary | ICD-10-CM | POA: Diagnosis not present

## 2016-03-15 DIAGNOSIS — Z79899 Other long term (current) drug therapy: Secondary | ICD-10-CM | POA: Diagnosis not present

## 2016-07-05 DIAGNOSIS — R0789 Other chest pain: Secondary | ICD-10-CM | POA: Diagnosis not present

## 2016-07-05 DIAGNOSIS — K219 Gastro-esophageal reflux disease without esophagitis: Secondary | ICD-10-CM | POA: Diagnosis not present

## 2016-07-05 DIAGNOSIS — I10 Essential (primary) hypertension: Secondary | ICD-10-CM | POA: Diagnosis not present

## 2016-07-08 ENCOUNTER — Other Ambulatory Visit: Payer: Self-pay | Admitting: Geriatric Medicine

## 2016-07-08 DIAGNOSIS — R0789 Other chest pain: Secondary | ICD-10-CM

## 2016-07-10 ENCOUNTER — Telehealth (HOSPITAL_COMMUNITY): Payer: Self-pay | Admitting: *Deleted

## 2016-07-10 NOTE — Telephone Encounter (Signed)
Left message on voicemail in reference to upcoming appointment scheduled for 07/12/16. Phone number given for a call back so details instructions can be given.  Paula [name]

## 2016-07-12 ENCOUNTER — Ambulatory Visit (HOSPITAL_COMMUNITY): Payer: Medicare HMO | Attending: Cardiovascular Disease

## 2016-07-12 DIAGNOSIS — R0789 Other chest pain: Secondary | ICD-10-CM | POA: Diagnosis not present

## 2016-07-12 DIAGNOSIS — I2510 Atherosclerotic heart disease of native coronary artery without angina pectoris: Secondary | ICD-10-CM | POA: Insufficient documentation

## 2016-07-12 DIAGNOSIS — R079 Chest pain, unspecified: Secondary | ICD-10-CM | POA: Insufficient documentation

## 2016-07-12 DIAGNOSIS — I10 Essential (primary) hypertension: Secondary | ICD-10-CM | POA: Diagnosis not present

## 2016-07-12 LAB — MYOCARDIAL PERFUSION IMAGING
CHL CUP NUCLEAR SRS: 2
CHL CUP NUCLEAR SSS: 2
CSEPEDS: 0 s
CSEPEW: 7 METS
CSEPHR: 94 %
CSEPPHR: 134 {beats}/min
Exercise duration (min): 6 min
LV dias vol: 80 mL (ref 46–106)
LV sys vol: 29 mL
MPHR: 142 {beats}/min
RATE: 0.25
Rest HR: 73 {beats}/min
SDS: 0
TID: 1.19

## 2016-07-12 MED ORDER — TECHNETIUM TC 99M TETROFOSMIN IV KIT
10.2000 | PACK | Freq: Once | INTRAVENOUS | Status: AC | PRN
  Administered 2016-07-12: 10.2 via INTRAVENOUS
  Filled 2016-07-12: qty 11

## 2016-07-12 MED ORDER — TECHNETIUM TC 99M TETROFOSMIN IV KIT
30.8000 | PACK | Freq: Once | INTRAVENOUS | Status: AC | PRN
  Administered 2016-07-12: 30.8 via INTRAVENOUS
  Filled 2016-07-12: qty 31

## 2016-08-06 DIAGNOSIS — Z85828 Personal history of other malignant neoplasm of skin: Secondary | ICD-10-CM | POA: Diagnosis not present

## 2016-08-06 DIAGNOSIS — L570 Actinic keratosis: Secondary | ICD-10-CM | POA: Diagnosis not present

## 2016-08-08 DIAGNOSIS — H5210 Myopia, unspecified eye: Secondary | ICD-10-CM | POA: Diagnosis not present

## 2016-08-08 DIAGNOSIS — I10 Essential (primary) hypertension: Secondary | ICD-10-CM | POA: Diagnosis not present

## 2016-09-10 DIAGNOSIS — K219 Gastro-esophageal reflux disease without esophagitis: Secondary | ICD-10-CM | POA: Diagnosis not present

## 2016-09-10 DIAGNOSIS — I10 Essential (primary) hypertension: Secondary | ICD-10-CM | POA: Diagnosis not present

## 2016-09-12 DIAGNOSIS — M8588 Other specified disorders of bone density and structure, other site: Secondary | ICD-10-CM | POA: Diagnosis not present

## 2016-10-07 DIAGNOSIS — Z23 Encounter for immunization: Secondary | ICD-10-CM | POA: Diagnosis not present

## 2016-10-07 DIAGNOSIS — I10 Essential (primary) hypertension: Secondary | ICD-10-CM | POA: Diagnosis not present

## 2016-10-07 DIAGNOSIS — M810 Age-related osteoporosis without current pathological fracture: Secondary | ICD-10-CM | POA: Diagnosis not present

## 2016-10-21 ENCOUNTER — Other Ambulatory Visit: Payer: Self-pay | Admitting: Geriatric Medicine

## 2016-10-21 DIAGNOSIS — Z1231 Encounter for screening mammogram for malignant neoplasm of breast: Secondary | ICD-10-CM

## 2016-11-14 DIAGNOSIS — L900 Lichen sclerosus et atrophicus: Secondary | ICD-10-CM | POA: Diagnosis not present

## 2016-11-22 ENCOUNTER — Ambulatory Visit
Admission: RE | Admit: 2016-11-22 | Discharge: 2016-11-22 | Disposition: A | Discharge status: Home / Self Care | Payer: Medicare HMO | Source: Ambulatory Visit | Attending: Geriatric Medicine | Admitting: Geriatric Medicine

## 2016-11-22 DIAGNOSIS — Z1231 Encounter for screening mammogram for malignant neoplasm of breast: Secondary | ICD-10-CM

## 2016-12-06 DIAGNOSIS — L299 Pruritus, unspecified: Secondary | ICD-10-CM | POA: Diagnosis not present

## 2016-12-06 DIAGNOSIS — M8580 Other specified disorders of bone density and structure, unspecified site: Secondary | ICD-10-CM | POA: Diagnosis not present

## 2016-12-06 DIAGNOSIS — I10 Essential (primary) hypertension: Secondary | ICD-10-CM | POA: Diagnosis not present

## 2017-03-19 DIAGNOSIS — J309 Allergic rhinitis, unspecified: Secondary | ICD-10-CM | POA: Diagnosis not present

## 2017-03-19 DIAGNOSIS — Z0000 Encounter for general adult medical examination without abnormal findings: Secondary | ICD-10-CM | POA: Diagnosis not present

## 2017-03-19 DIAGNOSIS — Z1389 Encounter for screening for other disorder: Secondary | ICD-10-CM | POA: Diagnosis not present

## 2017-03-19 DIAGNOSIS — E7800 Pure hypercholesterolemia, unspecified: Secondary | ICD-10-CM | POA: Diagnosis not present

## 2017-03-19 DIAGNOSIS — E039 Hypothyroidism, unspecified: Secondary | ICD-10-CM | POA: Diagnosis not present

## 2017-03-19 DIAGNOSIS — M8580 Other specified disorders of bone density and structure, unspecified site: Secondary | ICD-10-CM | POA: Diagnosis not present

## 2017-03-19 DIAGNOSIS — Z1211 Encounter for screening for malignant neoplasm of colon: Secondary | ICD-10-CM | POA: Diagnosis not present

## 2017-03-19 DIAGNOSIS — I10 Essential (primary) hypertension: Secondary | ICD-10-CM | POA: Diagnosis not present

## 2017-03-19 DIAGNOSIS — Z79899 Other long term (current) drug therapy: Secondary | ICD-10-CM | POA: Diagnosis not present

## 2017-04-29 DIAGNOSIS — M1711 Unilateral primary osteoarthritis, right knee: Secondary | ICD-10-CM | POA: Diagnosis not present

## 2017-05-27 DIAGNOSIS — M25561 Pain in right knee: Secondary | ICD-10-CM | POA: Diagnosis not present

## 2017-05-27 DIAGNOSIS — M1711 Unilateral primary osteoarthritis, right knee: Secondary | ICD-10-CM | POA: Diagnosis not present

## 2017-07-30 DIAGNOSIS — M25561 Pain in right knee: Secondary | ICD-10-CM | POA: Diagnosis not present

## 2017-08-04 DIAGNOSIS — D1801 Hemangioma of skin and subcutaneous tissue: Secondary | ICD-10-CM | POA: Diagnosis not present

## 2017-08-04 DIAGNOSIS — Z85828 Personal history of other malignant neoplasm of skin: Secondary | ICD-10-CM | POA: Diagnosis not present

## 2017-08-04 DIAGNOSIS — L570 Actinic keratosis: Secondary | ICD-10-CM | POA: Diagnosis not present

## 2017-08-12 DIAGNOSIS — Z961 Presence of intraocular lens: Secondary | ICD-10-CM | POA: Diagnosis not present

## 2017-08-12 DIAGNOSIS — H35363 Drusen (degenerative) of macula, bilateral: Secondary | ICD-10-CM | POA: Diagnosis not present

## 2017-09-17 DIAGNOSIS — Z79899 Other long term (current) drug therapy: Secondary | ICD-10-CM | POA: Diagnosis not present

## 2017-09-17 DIAGNOSIS — I10 Essential (primary) hypertension: Secondary | ICD-10-CM | POA: Diagnosis not present

## 2017-10-16 ENCOUNTER — Other Ambulatory Visit: Payer: Self-pay | Admitting: Geriatric Medicine

## 2017-10-16 DIAGNOSIS — Z1231 Encounter for screening mammogram for malignant neoplasm of breast: Secondary | ICD-10-CM

## 2017-10-31 ENCOUNTER — Ambulatory Visit: Payer: Medicare HMO

## 2017-12-02 ENCOUNTER — Ambulatory Visit
Admission: RE | Admit: 2017-12-02 | Discharge: 2017-12-02 | Disposition: A | Discharge status: Home / Self Care | Payer: Medicare HMO | Source: Ambulatory Visit | Attending: Geriatric Medicine | Admitting: Geriatric Medicine

## 2017-12-02 DIAGNOSIS — Z1231 Encounter for screening mammogram for malignant neoplasm of breast: Secondary | ICD-10-CM

## 2017-12-24 DIAGNOSIS — Z01419 Encounter for gynecological examination (general) (routine) without abnormal findings: Secondary | ICD-10-CM | POA: Diagnosis not present

## 2017-12-24 DIAGNOSIS — L900 Lichen sclerosus et atrophicus: Secondary | ICD-10-CM | POA: Diagnosis not present

## 2018-01-03 DIAGNOSIS — N390 Urinary tract infection, site not specified: Secondary | ICD-10-CM | POA: Diagnosis not present

## 2018-01-03 DIAGNOSIS — R319 Hematuria, unspecified: Secondary | ICD-10-CM | POA: Diagnosis not present

## 2018-01-03 DIAGNOSIS — E860 Dehydration: Secondary | ICD-10-CM | POA: Diagnosis not present

## 2018-01-03 DIAGNOSIS — R42 Dizziness and giddiness: Secondary | ICD-10-CM | POA: Diagnosis not present

## 2018-03-23 ENCOUNTER — Other Ambulatory Visit: Payer: Self-pay | Admitting: Geriatric Medicine

## 2018-03-23 ENCOUNTER — Ambulatory Visit
Admission: RE | Admit: 2018-03-23 | Discharge: 2018-03-23 | Disposition: A | Discharge status: Home / Self Care | Payer: Medicare HMO | Source: Ambulatory Visit | Attending: Geriatric Medicine | Admitting: Geriatric Medicine

## 2018-03-23 DIAGNOSIS — Z1389 Encounter for screening for other disorder: Secondary | ICD-10-CM | POA: Diagnosis not present

## 2018-03-23 DIAGNOSIS — K219 Gastro-esophageal reflux disease without esophagitis: Secondary | ICD-10-CM | POA: Diagnosis not present

## 2018-03-23 DIAGNOSIS — R062 Wheezing: Secondary | ICD-10-CM | POA: Diagnosis not present

## 2018-03-23 DIAGNOSIS — I700 Atherosclerosis of aorta: Secondary | ICD-10-CM | POA: Diagnosis not present

## 2018-03-23 DIAGNOSIS — E039 Hypothyroidism, unspecified: Secondary | ICD-10-CM | POA: Diagnosis not present

## 2018-03-23 DIAGNOSIS — Z79899 Other long term (current) drug therapy: Secondary | ICD-10-CM | POA: Diagnosis not present

## 2018-03-23 DIAGNOSIS — R05 Cough: Secondary | ICD-10-CM | POA: Diagnosis not present

## 2018-03-23 DIAGNOSIS — Z0000 Encounter for general adult medical examination without abnormal findings: Secondary | ICD-10-CM | POA: Diagnosis not present

## 2018-03-23 DIAGNOSIS — I10 Essential (primary) hypertension: Secondary | ICD-10-CM | POA: Diagnosis not present

## 2018-03-23 DIAGNOSIS — E7800 Pure hypercholesterolemia, unspecified: Secondary | ICD-10-CM | POA: Diagnosis not present

## 2018-06-09 DIAGNOSIS — M1711 Unilateral primary osteoarthritis, right knee: Secondary | ICD-10-CM | POA: Diagnosis not present

## 2018-08-05 DIAGNOSIS — Z85828 Personal history of other malignant neoplasm of skin: Secondary | ICD-10-CM | POA: Diagnosis not present

## 2018-08-05 DIAGNOSIS — L570 Actinic keratosis: Secondary | ICD-10-CM | POA: Diagnosis not present

## 2018-08-05 DIAGNOSIS — D1801 Hemangioma of skin and subcutaneous tissue: Secondary | ICD-10-CM | POA: Diagnosis not present

## 2018-08-21 DIAGNOSIS — R05 Cough: Secondary | ICD-10-CM | POA: Diagnosis not present

## 2018-09-08 DIAGNOSIS — R55 Syncope and collapse: Secondary | ICD-10-CM | POA: Diagnosis not present

## 2018-09-08 DIAGNOSIS — I10 Essential (primary) hypertension: Secondary | ICD-10-CM | POA: Diagnosis not present

## 2018-09-16 DIAGNOSIS — Z79899 Other long term (current) drug therapy: Secondary | ICD-10-CM | POA: Diagnosis not present

## 2018-09-16 DIAGNOSIS — R42 Dizziness and giddiness: Secondary | ICD-10-CM | POA: Diagnosis not present

## 2018-09-16 DIAGNOSIS — R55 Syncope and collapse: Secondary | ICD-10-CM | POA: Diagnosis not present

## 2018-09-16 DIAGNOSIS — I10 Essential (primary) hypertension: Secondary | ICD-10-CM | POA: Diagnosis not present

## 2018-10-12 DIAGNOSIS — R55 Syncope and collapse: Secondary | ICD-10-CM | POA: Diagnosis not present

## 2018-10-23 ENCOUNTER — Other Ambulatory Visit: Payer: Self-pay | Admitting: Geriatric Medicine

## 2018-10-23 DIAGNOSIS — Z1231 Encounter for screening mammogram for malignant neoplasm of breast: Secondary | ICD-10-CM

## 2018-10-28 DIAGNOSIS — I472 Ventricular tachycardia: Secondary | ICD-10-CM | POA: Diagnosis not present

## 2018-10-28 DIAGNOSIS — R55 Syncope and collapse: Secondary | ICD-10-CM | POA: Diagnosis not present

## 2018-12-09 ENCOUNTER — Ambulatory Visit
Admission: RE | Admit: 2018-12-09 | Discharge: 2018-12-09 | Disposition: A | Discharge status: Home / Self Care | Payer: Medicare HMO | Source: Ambulatory Visit | Attending: Geriatric Medicine | Admitting: Geriatric Medicine

## 2018-12-09 ENCOUNTER — Other Ambulatory Visit: Payer: Self-pay

## 2018-12-09 DIAGNOSIS — Z1231 Encounter for screening mammogram for malignant neoplasm of breast: Secondary | ICD-10-CM

## 2018-12-29 DIAGNOSIS — L900 Lichen sclerosus et atrophicus: Secondary | ICD-10-CM | POA: Diagnosis not present

## 2019-01-26 DIAGNOSIS — L570 Actinic keratosis: Secondary | ICD-10-CM | POA: Diagnosis not present

## 2019-01-26 DIAGNOSIS — Z85828 Personal history of other malignant neoplasm of skin: Secondary | ICD-10-CM | POA: Diagnosis not present

## 2019-05-05 DIAGNOSIS — K219 Gastro-esophageal reflux disease without esophagitis: Secondary | ICD-10-CM | POA: Diagnosis not present

## 2019-05-05 DIAGNOSIS — Z0000 Encounter for general adult medical examination without abnormal findings: Secondary | ICD-10-CM | POA: Diagnosis not present

## 2019-05-05 DIAGNOSIS — M8580 Other specified disorders of bone density and structure, unspecified site: Secondary | ICD-10-CM | POA: Diagnosis not present

## 2019-05-05 DIAGNOSIS — I480 Paroxysmal atrial fibrillation: Secondary | ICD-10-CM | POA: Diagnosis not present

## 2019-05-05 DIAGNOSIS — E7800 Pure hypercholesterolemia, unspecified: Secondary | ICD-10-CM | POA: Diagnosis not present

## 2019-05-05 DIAGNOSIS — Z1389 Encounter for screening for other disorder: Secondary | ICD-10-CM | POA: Diagnosis not present

## 2019-05-05 DIAGNOSIS — Z79899 Other long term (current) drug therapy: Secondary | ICD-10-CM | POA: Diagnosis not present

## 2019-05-05 DIAGNOSIS — I10 Essential (primary) hypertension: Secondary | ICD-10-CM | POA: Diagnosis not present

## 2019-05-05 DIAGNOSIS — E039 Hypothyroidism, unspecified: Secondary | ICD-10-CM | POA: Diagnosis not present

## 2019-05-05 DIAGNOSIS — I498 Other specified cardiac arrhythmias: Secondary | ICD-10-CM | POA: Diagnosis not present

## 2019-05-11 ENCOUNTER — Encounter (HOSPITAL_COMMUNITY): Payer: Self-pay | Admitting: Physician Assistant

## 2019-05-11 ENCOUNTER — Other Ambulatory Visit: Payer: Self-pay

## 2019-05-11 ENCOUNTER — Ambulatory Visit (HOSPITAL_COMMUNITY)
Admission: RE | Admit: 2019-05-11 | Discharge: 2019-05-11 | Disposition: A | Discharge status: Home / Self Care | Payer: Medicare HMO | Source: Ambulatory Visit | Attending: Physician Assistant | Admitting: Physician Assistant

## 2019-05-11 VITALS — BP 130/70 | HR 74 | Ht 64.0 in | Wt 140.8 lb

## 2019-05-11 DIAGNOSIS — I480 Paroxysmal atrial fibrillation: Secondary | ICD-10-CM | POA: Diagnosis not present

## 2019-05-11 DIAGNOSIS — I10 Essential (primary) hypertension: Secondary | ICD-10-CM | POA: Diagnosis not present

## 2019-05-11 DIAGNOSIS — D6869 Other thrombophilia: Secondary | ICD-10-CM

## 2019-05-11 DIAGNOSIS — I700 Atherosclerosis of aorta: Secondary | ICD-10-CM | POA: Insufficient documentation

## 2019-05-11 DIAGNOSIS — E039 Hypothyroidism, unspecified: Secondary | ICD-10-CM | POA: Diagnosis not present

## 2019-05-11 DIAGNOSIS — Z79899 Other long term (current) drug therapy: Secondary | ICD-10-CM | POA: Diagnosis not present

## 2019-05-11 NOTE — Progress Notes (Signed)
Primary Care Physician: Lajean Manes, MD Primary Cardiologist: none Primary Electrophysiologist: none Referring Physician: Dr Elmer Picker Paula [name] is a 84 y.o. female with a history of HTN, hypothyroidism, aortic atherosclerosis, and paroxysmal atrial fibrillation who presents for consultation in the [facility].  The patient was initially diagnosed with atrial fibrillation 05/05/19 after presenting with symptoms of palpitations to her PCP office. ECG showed afib with RVR. Patient was started on Eliquis for a CHADS2VASC score of 5 and diltiazem for rate control. Patient has reported palpitations in the past and has worn cardiac monitors but this was the first captured afib episode. She has not had any further heart racing since starting diltiazem. She denies bleeding issues on anticoagulation.   Today, she denies symptoms of palpitations, chest pain, shortness of breath, orthopnea, PND, lower extremity edema, dizziness, presyncope, syncope, snoring, daytime somnolence, bleeding, or neurologic sequela. The patient is tolerating medications without difficulties and is otherwise without complaint today.    Atrial Fibrillation Risk Factors:  she does not have symptoms or diagnosis of sleep apnea. she does not have a history of rheumatic fever. she does not have a history of alcohol use. The patient does not have a history of early familial atrial fibrillation or other arrhythmias.  she has a BMI of Body mass index is 24.17 kg/m.Marland Kitchen Filed Weights   05/11/19 1005  Weight: 63.9 kg    Family History  Problem Relation Age of Onset  . Breast cancer Sister 44     Atrial Fibrillation Management history:  Previous antiarrhythmic drugs: none Previous cardioversions: none Previous ablations: none CHADS2VASC score: 5 Anticoagulation history: Eliquis   Past Medical History:  Diagnosis Date  . Irregular heart rate   . Thyroid disease    No past  surgical history on file.  Current Outpatient Medications  Medication Sig Dispense Refill  . alendronate (FOSAMAX) 70 MG tablet     . atorvastatin (LIPITOR) 10 MG tablet Take 10 mg by mouth daily at 6 PM.    . calcium carbonate (OS-CAL - DOSED IN MG OF ELEMENTAL CALCIUM) 1250 (500 Ca) MG tablet Take 1 tablet by mouth daily with breakfast.    . diltiazem (CARDIZEM CD) 180 MG 24 hr capsule Take 180 mg by mouth daily.    . famotidine (PEPCID) 10 MG tablet Take 10 mg by mouth at bedtime.    . hydrochlorothiazide (MICROZIDE) 12.5 MG capsule     . levothyroxine (SYNTHROID, LEVOTHROID) 88 MCG tablet Take 88 mcg by mouth daily before breakfast.    . Multiple Vitamin (MULTIVITAMIN WITH MINERALS) TABS tablet Take 1 tablet by mouth daily.    . sodium chloride (OCEAN) 0.65 % SOLN nasal spray Place 1 spray into both nostrils as needed for congestion.    . triamcinolone cream (KENALOG) 0.5 %      No current facility-administered medications for this encounter.    No Known Allergies  Social History   Socioeconomic History  . Marital status: Married    Spouse name: Not on file  . Number of children: Not on file  . Years of education: Not on file  . Highest education level: Not on file  Occupational History  . Not on file  Tobacco Use  . Smoking status: Never Smoker  . Smokeless tobacco: Never Used  Substance and Sexual Activity  . Alcohol use: Yes    Alcohol/week: 1.0 standard drinks    Types: 1 Glasses of wine per week  . Drug  use: No  . Sexual activity: Not on file  Other Topics Concern  . Not on file  Social History Narrative  . Not on file   Social Determinants of Health   Financial Resource Strain:   . Difficulty of Paying Living Expenses:   Food Insecurity:   . Worried About Charity fundraiser in the Last Year:   . Arboriculturist in the Last Year:   Transportation Needs:   . Film/video editor (Medical):   Marland Kitchen Lack of Transportation (Non-Medical):   Physical Activity:    . Days of Exercise per Week:   . Minutes of Exercise per Session:   Stress:   . Feeling of Stress :   Social Connections:   . Frequency of Communication with Friends and Family:   . Frequency of Social Gatherings with Friends and Family:   . Attends Religious Services:   . Active Member of Clubs or Organizations:   . Attends Archivist Meetings:   Marland Kitchen Marital Status:   Intimate Partner Violence:   . Fear of Current or Ex-Partner:   . Emotionally Abused:   Marland Kitchen Physically Abused:   . Sexually Abused:      ROS- All systems are reviewed and negative except as per the HPI above.  Physical Exam: Vitals:   05/11/19 1005  BP: 130/70  Pulse: 74  Weight: 63.9 kg  Height: 5\' 4"  (1.626 m)    GEN- The patient is well appearing elderly female, alert and oriented x 3 today.   Head- normocephalic, atraumatic Eyes-  Sclera clear, conjunctiva pink Ears- hearing intact Oropharynx- clear Neck- supple  Lungs- Clear to ausculation bilaterally, normal work of breathing Heart- Regular rate and rhythm, no murmurs, rubs or gallops  GI- soft, NT, ND, + BS Extremities- no clubbing, cyanosis, or edema MS- no significant deformity or atrophy Skin- no rash or lesion Psych- euthymic mood, full affect Neuro- strength and sensation are intact  Wt Readings from Last 3 Encounters:  05/11/19 63.9 kg  03/06/15 63 kg    EKG today demonstrates SR HR 74, NST (baseline), PR 148, QRS 78, QTc 468  Epic records are reviewed at length today  Labs from PCP office 05/05/19 TSH 0.59 Cr 0.90, K+ 4.0, AST 25, ALT 20 WBC 7.7, Hgb 14.2, HCT 42.0  CHA2DS2-VASc Score = 5 The patient's score is based upon: CHF History: No HTN History: Yes Age : 84 + Diabetes History: No Stroke History: No Vascular Disease History: Yes Gender: Female      ASSESSMENT AND PLAN: 1. Paroxysmal Atrial Fibrillation (ICD10:  I48.0) The patient's CHA2DS2-VASc score is 5, indicating a 7.2% annual risk of stroke.     General education about afib provided and questions answered. We also discussed her stroke risk and the risks and benefits of anticoagulation.  Check echocardiogram Continue Eliquis 5 mg BID. Check bmet/CBC at follow up. Continue diltiazem 180 mg daily  2. Secondary Hypercoagulable State (ICD10:  D68.69) The patient is at significant risk for stroke/thromboembolism based upon her CHA2DS2-VASc Score of 5.  Continue Apixaban (Eliquis).   3. HTN Stable, no changes today.   Follow up in the AF clinic in one month.    [facility] [address] [place], [place] [address] [phone] 05/11/2019 10:38 AM

## 2019-05-20 DIAGNOSIS — Z0100 Encounter for examination of eyes and vision without abnormal findings: Secondary | ICD-10-CM | POA: Diagnosis not present

## 2019-05-20 DIAGNOSIS — H35363 Drusen (degenerative) of macula, bilateral: Secondary | ICD-10-CM | POA: Diagnosis not present

## 2019-05-20 DIAGNOSIS — Z961 Presence of intraocular lens: Secondary | ICD-10-CM | POA: Diagnosis not present

## 2019-05-20 DIAGNOSIS — H26491 Other secondary cataract, right eye: Secondary | ICD-10-CM | POA: Diagnosis not present

## 2019-05-20 DIAGNOSIS — H524 Presbyopia: Secondary | ICD-10-CM | POA: Diagnosis not present

## 2019-05-20 DIAGNOSIS — H35031 Hypertensive retinopathy, right eye: Secondary | ICD-10-CM | POA: Diagnosis not present

## 2019-05-20 DIAGNOSIS — E7800 Pure hypercholesterolemia, unspecified: Secondary | ICD-10-CM | POA: Diagnosis not present

## 2019-05-27 ENCOUNTER — Other Ambulatory Visit: Payer: Self-pay

## 2019-05-27 ENCOUNTER — Other Ambulatory Visit (HOSPITAL_COMMUNITY): Payer: Self-pay | Admitting: *Deleted

## 2019-05-27 ENCOUNTER — Ambulatory Visit (HOSPITAL_COMMUNITY)
Admission: RE | Admit: 2019-05-27 | Discharge: 2019-05-27 | Disposition: A | Discharge status: Home / Self Care | Payer: Medicare HMO | Source: Ambulatory Visit | Attending: Physician Assistant | Admitting: Physician Assistant

## 2019-05-27 DIAGNOSIS — I083 Combined rheumatic disorders of mitral, aortic and tricuspid valves: Secondary | ICD-10-CM | POA: Diagnosis not present

## 2019-05-27 DIAGNOSIS — I422 Other hypertrophic cardiomyopathy: Secondary | ICD-10-CM | POA: Insufficient documentation

## 2019-05-27 DIAGNOSIS — I480 Paroxysmal atrial fibrillation: Secondary | ICD-10-CM | POA: Insufficient documentation

## 2019-05-27 MED ORDER — PERFLUTREN LIPID MICROSPHERE
1.0000 mL | INTRAVENOUS | Status: AC | PRN
  Administered 2019-05-27: 3 mL via INTRAVENOUS
  Filled 2019-05-27: qty 10

## 2019-05-27 NOTE — Progress Notes (Signed)
  Echocardiogram 2D Echocardiogram has been performed.  Paula [name] 05/27/2019, 12:12 PM

## 2019-06-08 ENCOUNTER — Ambulatory Visit (HOSPITAL_COMMUNITY): Payer: Medicare HMO | Admitting: Physician Assistant

## 2019-06-11 ENCOUNTER — Telehealth: Payer: Self-pay | Admitting: Cardiology

## 2019-06-11 NOTE — Telephone Encounter (Signed)
Pt states that her husband has accompany her to her appointment in order to assist her with mobility especially since they are driving from home which is about 40 miles away. I let her know that I would make a note in the chart that Paula [name] would be able to attend.

## 2019-06-11 NOTE — Telephone Encounter (Signed)
Patient states that she lives out of town and is requesting to have her husband come with her to her appt on 06/14/19 with Dr. Gardiner Rhyme.

## 2019-06-13 NOTE — Progress Notes (Signed)
Cardiology Office Note:    Date:  06/14/2019   ID:  Paula [name], DOB 11/25/1935, MRN [id number]  PCP:  Lajean Manes, MD  Cardiologist:  No primary care provider on file.  Electrophysiologist:  None   Referring MD: Oliver Barre, PA   Chief Complaint  Patient presents with  . Cardiomyopathy    History of Present Illness:    Paula [name] is a 84 y.o. female with a hx of paroxysmal atrial fibrillation, hypertension, hypothyroidism who is referred by [name], PA for evaluation of hypertrophic cardiomyopathy.    Diagnosed with atrial fibrillation after presenting with palpitations to PCP office in April. EKG showed A. fib with RVR. Started on Eliquis given CHA2DS2-VASc score 5.  Also started on diltiazem for rate control. She was seen in AF clinic on 05/11/2019, she was noted to be in normal sinus rhythm. She was continued on Eliquis 5 mg twice daily and diltiazem 180 mg daily. TTE on 05/27/19 was concerning for apical variant hypertrophic cardiomyopathy, EF 60 to 65%, normal RV function, moderate elevation in RVSP (48 mmHg), mild mitral regurgitation, mild to moderate tricuspid regurgitation.  Reports symptoms were vague when in atrial fibrillation but felt like she could not get comfortable and felt fatigued.  She does report intermittent palpitations.  Also states that he has had intermittent episodes of lightheadedness.  Had 2 syncopal episodes that occurred 3 years ago, she went to the hospital and was told she was dehydrated.  No syncopal episodes since that time.  She denies any chest pain.  Reports over the last few days has noticed she has had resting heart rates up to the 110s, but states it will also be in the 60s to 70s.  She has been taking Eliquis 5 mg twice daily, denies any bleeding issues.  Mother died suddenly in 40s, unclear cause.  No other family history.  No smoking history.    Past Medical History:  Diagnosis Date  . Irregular heart rate   .  Thyroid disease     No past surgical history on file.  Current Medications: Current Meds  Medication Sig  . alendronate (FOSAMAX) 70 MG tablet   . atorvastatin (LIPITOR) 10 MG tablet Take 10 mg by mouth daily at 6 PM.  . calcium carbonate (OS-CAL - DOSED IN MG OF ELEMENTAL CALCIUM) 1250 (500 Ca) MG tablet Take 1 tablet by mouth daily with breakfast.  . diltiazem (CARDIZEM CD) 240 MG 24 hr capsule Take 1 capsule (240 mg total) by mouth daily.  . famotidine (PEPCID) 10 MG tablet Take 10 mg by mouth at bedtime.  . hydrochlorothiazide (MICROZIDE) 12.5 MG capsule   . levothyroxine (SYNTHROID, LEVOTHROID) 88 MCG tablet Take 88 mcg by mouth daily before breakfast.  . Multiple Vitamin (MULTIVITAMIN WITH MINERALS) TABS tablet Take 1 tablet by mouth daily.  . sodium chloride (OCEAN) 0.65 % SOLN nasal spray Place 1 spray into both nostrils as needed for congestion.  . triamcinolone cream (KENALOG) 0.5 %   . [DISCONTINUED] diltiazem (CARDIZEM CD) 180 MG 24 hr capsule Take 180 mg by mouth daily.     Allergies:   Patient has no known allergies.   Social History   Socioeconomic History  . Marital status: Married    Spouse name: Not on file  . Number of children: Not on file  . Years of education: Not on file  . Highest education level: Not on file  Occupational History  . Not on file  Tobacco Use  .  Smoking status: Never Smoker  . Smokeless tobacco: Never Used  Substance and Sexual Activity  . Alcohol use: Yes    Alcohol/week: 1.0 standard drinks    Types: 1 Glasses of wine per week  . Drug use: No  . Sexual activity: Not on file  Other Topics Concern  . Not on file  Social History Narrative  . Not on file   Social Determinants of Health   Financial Resource Strain:   . Difficulty of Paying Living Expenses:   Food Insecurity:   . Worried About Charity fundraiser in the Last Year:   . Arboriculturist in the Last Year:   Transportation Needs:   . Film/video editor  (Medical):   Marland Kitchen Lack of Transportation (Non-Medical):   Physical Activity:   . Days of Exercise per Week:   . Minutes of Exercise per Session:   Stress:   . Feeling of Stress :   Social Connections:   . Frequency of Communication with Friends and Family:   . Frequency of Social Gatherings with Friends and Family:   . Attends Religious Services:   . Active Member of Clubs or Organizations:   . Attends Archivist Meetings:   Marland Kitchen Marital Status:      Family History: The patient's family history includes Breast cancer (age of onset: 54) in her sister.  ROS:   Please see the history of present illness.     All other systems reviewed and are negative.  EKGs/Labs/Other Studies Reviewed:    The following studies were reviewed today:   EKG:  EKG is ordered today.  The ekg ordered today demonstrates atrial fibrillation, rate 114, T wave inversions in leads V4-6  Recent Labs: No results found for requested labs within last 8760 hours.  Recent Lipid Panel No results found for: CHOL, TRIG, HDL, CHOLHDL, VLDL, LDLCALC, LDLDIRECT  Physical Exam:    VS:  BP 124/90   Pulse (!) 114   Ht 5\' 4"  (1.626 m)   Wt 142 lb (64.4 kg)   SpO2 94%   BMI 24.37 kg/m     Wt Readings from Last 3 Encounters:  06/14/19 142 lb (64.4 kg)  05/11/19 140 lb 12.8 oz (63.9 kg)  03/06/15 139 lb (63 kg)     GEN: in no acute distress HEENT: Normal NECK: No JVD; No carotid bruits CARDIAC: tachycardic, irregular, no murmurs, rubs, gallops RESPIRATORY:  Clear to auscultation without rales, wheezing or rhonchi  ABDOMEN: Soft, non-tender, non-distended MUSCULOSKELETAL:  No edema; No deformity  SKIN: Warm and dry NEUROLOGIC:  Alert and oriented x 3 PSYCHIATRIC:  Normal affect   ASSESSMENT:    1. Hypertrophic cardiomyopathy (HCC)   2. Paroxysmal atrial fibrillation ([place])   3. Essential hypertension   4. Hyperlipidemia, unspecified hyperlipidemia type    PLAN:     LVH: Echocardiogram  concerning for apical variant HCM -Recommend cardiac MRI -If HCM confirmed on MRI, would recommend her son be screened for HCM  Paroxysmal atrial fibrillation: Diagnosed 04/2019 after presenting to PCPs office with palpitations. CHA2DS2-VASc score 5 -Continue Eliquis 5 mg twice daily -In AF with RVR in clinic today, and reports heart rates have been intermittently up to 110s at home.  Will increase diltiazem to 240 mg daily.  Will check Zio patch x7 days to monitor rate control and determine AF burden  Hypertension: On hydrochlorothiazide 12.5 mg daily and diltiazem 180 mg daily.  Appears controlled  Hyperlipidemia: On atorvastatin 10 mg daily  RTC in 2 months  Medication Adjustments/Labs and Tests Ordered: Current medicines are reviewed at length with the patient today.  Concerns regarding medicines are outlined above.  Orders Placed This Encounter  Procedures  . MR CARDIAC MORPHOLOGY W WO CONTRAST  . LONG TERM MONITOR (3-14 DAYS)  . EKG 12-Lead   Meds ordered this encounter  Medications  . diltiazem (CARDIZEM CD) 240 MG 24 hr capsule    Sig: Take 1 capsule (240 mg total) by mouth daily.    Dispense:  90 capsule    Refill:  3    Dose increase    Patient Instructions  Medication Instructions:  INCREASE diltiazem (Cardizem CD) to 240 mg daily  *If you need a refill on your cardiac medications before your next appointment, please call your pharmacy*  Testing/Procedures: Your physician has requested that you have a cardiac MRI. Cardiac MRI uses a computer to create images of your heart as its beating, producing both still and moving pictures of your heart and major blood vessels. For further information please visit [email or web address] Please follow the instruction sheet given to you today for more information.   ZIO XT- Long Term Monitor Instructions   Your physician has requested you wear your ZIO patch monitor 7 days.   This is a single patch monitor.  [employer] supplies one  patch monitor per enrollment.  Additional stickers are not available.   Please do not apply patch if you will be having a Nuclear Stress Test, Echocardiogram, Cardiac CT, MRI, or Chest Xray during the time frame you would be wearing the monitor. The patch cannot be worn during these tests.  You cannot remove and re-apply the ZIO XT patch monitor.   Your ZIO patch monitor will be sent USPS Priority mail from [facility] directly to your home address. The monitor may also be mailed to a PO BOX if home delivery is not available.   It may take 3-5 days to receive your monitor after you have been enrolled.   Once you have received you monitor, please review enclosed instructions.  Your monitor has already been registered assigning a specific monitor serial # to you.   Applying the monitor   Shave hair from upper left chest.   Hold abrader disc by orange tab.  Rub abrader in 40 strokes over left upper chest as indicated in your monitor instructions.   Clean area with 4 enclosed alcohol pads .  Use all pads to assure are is cleaned thoroughly.  Let dry.   Apply patch as indicated in monitor instructions.  Patch will be place under collarbone on left side of chest with arrow pointing upward.   Rub patch adhesive wings for 2 minutes.Remove white label marked "1".  Remove white label marked "2".  Rub patch adhesive wings for 2 additional minutes.   While looking in a mirror, press and release button in center of patch.  A small green light will flash 3-4 times .  This will be your only indicator the monitor has been turned on.     Do not shower for the first 24 hours.  You may shower after the first 24 hours.   Press button if you feel a symptom. You will hear a small click.  Record Date, Time and Symptom in the Patient Log Book.   When you are ready to remove patch, follow instructions on last 2 pages of Patient Log Book.  Stick patch monitor onto last page of Patient Log Book.  Place  Patient Log Book in [place] box.  Use locking tab on box and tape box closed securely.  The [employer] has [employer] on it.  Please place in mailbox as soon as possible.  Your physician should have your test results approximately 7 days after the monitor has been mailed back to [facility].   Call [place] at [phone] if you have questions regarding your ZIO XT patch monitor.  Call them immediately if you see an orange light blinking on your monitor.   If your monitor falls off in less than 4 days contact our Monitor department at [phone].  If your monitor becomes loose or falls off after 4 days call [employer] at [phone] for suggestions on securing your monitor.    Follow-Up: At [facility], you and your health needs are our priority.  As part of our continuing mission to provide you with exceptional heart care, we have created designated Provider Care Teams.  These Care Teams include your primary Cardiologist (physician) and Advanced Practice Providers (APPs -  Physician Assistants and Nurse Practitioners) who all work together to provide you with the care you need, when you need it.  We recommend signing up for the patient portal called "MyChart".  Sign up information is provided on this After Visit Summary.  MyChart is used to connect with patients for Virtual Visits (Telemedicine).  Patients are able to view lab/test results, encounter notes, upcoming appointments, etc.  Non-urgent messages can be sent to your provider as well.   To learn more about what you can do with MyChart, go to [email or web address].    Your next appointment:   2 month(s)  The format for your next appointment:   In Person  Provider:   Oswaldo Milian, MD   Other Instructions  Hypertrophic Cardiomyopathy  Hypertrophic cardiomyopathy (HCM) is a heart condition in which part of the heart muscle gets too thick. The condition can cause a dangerous and  abnormal heart rhythm. It can also weaken the heart over time. The heart is divided into four chambers. The thickening usually affects the pumping chamber on the lower left (left ventricle). HCM may also affect the valve that lets blood flow into the left ventricle (mitral valve). Symptoms often begin when a person is about 82 years old. What are the causes? This condition is usually caused by abnormal genes that control heart muscle growth. These abnormal genes are passed down through families (are inherited). What increases the risk? You are more likely to develop this condition if you have a family history of HCM. What are the signs or symptoms? Symptoms of this condition include:  Shortness of breath, especially after exercising or lying down.  Chest pain.  Dizziness.  Fatigue.  Irregular or fast heart rate.  Fainting, especially after physical activity. How is this diagnosed? This condition is diagnosed based on:  A physical exam that involves checking for an abnormal heart sound (heart murmur).  Tests, such as: ? An electrocardiogram (ECG). This is a test that records your heart's electrical activity. ? An echocardiogram. This test can show whether your left ventricle is enlarged and whether it fills slowly. ? An exercise stress test. This test is done to collect information about how your heart functions during exercise. ? A Doppler test. This is a test that shows irregular blood flow and pressure differences inside the heart. ? A chest X-ray to see whether your heart is enlarged. ? An MRI. ? Genetic testing done  on a blood sample. How is this treated? Treatment for HCM depends on how severe your symptoms are. There are several options for treatment, including:  Medicine. Medicines can be given to: ? Reduce the workload of your heart. ? Lower your blood pressure. ? Thin your blood and prevent clots.  A device. Devices that can be used to treat this condition  include: ? A pacemaker. This device helps to control your heartbeat. ? A defibrillator. This device restores a normal heart rhythm.  Surgery. This may include a procedure to: ? Inject alcohol into the small blood vessels that supply your heart muscle (alcohol septal ablation).This is done during a procedure called cardiac catheterization. The goal is to cause the muscle to become thinner. ? Remove part of the wall that divides the right and left sides of the heart (septum) with a procedure called surgical myectomy. ? Replace the mitral valve. Follow these instructions at home: Activity  Avoid strenuous exercise and activities, such as shoveling snow. Do not participate in high intensity activities, such as competitive sports.  Get regular physical activity. Most patients can participate in low to moderate intensity exercise such as walking. Ask your health care provider what activity level is safe for you.  Do not lift anything that is heavier than 10 lb (4.5 kg), or the limit that you are told, until your health care provider says that it is safe. Lifestyle  Maintain a healthy weight. If you need help with losing weight, ask your health care provider.  Eat a heart-healthy diet.  Do not drink alcohol if: ? Your health care provider tells you not to drink. ? You are pregnant, may be pregnant, or are planning to become pregnant.  If you drink alcohol: ? Limit how much you use to:  0-1 drink a day for women.  0-2 drinks a day for men. ? Be aware of how much alcohol is in your drink. In the [place] one drink equals one 12 oz bottle of beer (355 mL), one 5 oz glass of wine (148 mL), or one 1 oz glass of hard liquor (44 mL).  Do not use any products that contain nicotine or tobacco, such as cigarettes, e-cigarettes, and chewing tobacco. If you need help quitting, ask your health care provider. General instructions  Make sure the members of your household know how to do CPR in case of an  emergency.  Take over-the-counter and prescription medicines only as told by your health care provider.  Keep all follow-up visits as told by your health care provider. This is important. Contact a health care provider if:  You have new symptoms.  Your symptoms get worse. Get help right away if:  You have chest pain or shortness of breath, especially during or after sports.  You feel faint or you pass out.  You have trouble breathing even at rest.  Your feet or ankles swell.  Your heartbeat seems irregular or seems faster than normal (palpitations). These symptoms may represent a serious problem that is an emergency. Do not wait to see if the symptoms will go away. Get medical help right away. Call your local emergency services (911 in the [place] Do not drive yourself to the hospital. Summary  Hypertrophic cardiomyopathy (HCM) is a heart condition in which part of the heart muscle gets too thick.  The condition can cause a dangerous and abnormal heart rhythm, and it can weaken the heart over time.  Avoid strenuous exercise and activities, such as shoveling snow.  Make sure the members of your household know how to do CPR in case of an emergency.  Keep all follow-up visits as told by your health care provider. This is important. This information is not intended to replace advice given to you by your health care provider. Make sure you discuss any questions you have with your health care provider. Document Revised: 09/30/2017 Document Reviewed: 09/30/2017 Elsevier Patient Education  2020 [place], Donato Heinz, MD  06/14/2019 12:52 PM    [place]

## 2019-06-14 ENCOUNTER — Other Ambulatory Visit: Payer: Self-pay

## 2019-06-14 ENCOUNTER — Ambulatory Visit: Payer: Medicare HMO | Admitting: Cardiology

## 2019-06-14 ENCOUNTER — Encounter: Payer: Self-pay | Admitting: *Deleted

## 2019-06-14 VITALS — BP 124/90 | HR 114 | Ht 64.0 in | Wt 142.0 lb

## 2019-06-14 DIAGNOSIS — I10 Essential (primary) hypertension: Secondary | ICD-10-CM | POA: Diagnosis not present

## 2019-06-14 DIAGNOSIS — I422 Other hypertrophic cardiomyopathy: Secondary | ICD-10-CM | POA: Diagnosis not present

## 2019-06-14 DIAGNOSIS — E785 Hyperlipidemia, unspecified: Secondary | ICD-10-CM

## 2019-06-14 DIAGNOSIS — I480 Paroxysmal atrial fibrillation: Secondary | ICD-10-CM | POA: Diagnosis not present

## 2019-06-14 MED ORDER — DILTIAZEM HCL ER COATED BEADS 240 MG PO CP24: 240.0000 mg | ORAL_CAPSULE | Freq: Every day | ORAL | 3 refills | Status: DC

## 2019-06-14 NOTE — Progress Notes (Signed)
Patient ID: Paula [name], female   DOB: [birth year], 84 y.o.   MRN: [id number] 7 day ZIO XT long term holter monitor shipped to patients home.

## 2019-06-14 NOTE — Patient Instructions (Signed)
Medication Instructions:  INCREASE diltiazem (Cardizem CD) to 240 mg daily  *If you need a refill on your cardiac medications before your next appointment, please call your pharmacy*  Testing/Procedures: Your physician has requested that you have a cardiac MRI. Cardiac MRI uses a computer to create images of your heart as its beating, producing both still and moving pictures of your heart and major blood vessels. For further information please visit [email or web address] Please follow the instruction sheet given to you today for more information.   ZIO XT- Long Term Monitor Instructions   Your physician has requested you wear your ZIO patch monitor 7 days.   This is a single patch monitor.  [employer] supplies one patch monitor per enrollment.  Additional stickers are not available.   Please do not apply patch if you will be having a Nuclear Stress Test, Echocardiogram, Cardiac CT, MRI, or Chest Xray during the time frame you would be wearing the monitor. The patch cannot be worn during these tests.  You cannot remove and re-apply the ZIO XT patch monitor.   Your ZIO patch monitor will be sent USPS Priority mail from [facility] directly to your home address. The monitor may also be mailed to a PO BOX if home delivery is not available.   It may take 3-5 days to receive your monitor after you have been enrolled.   Once you have received you monitor, please review enclosed instructions.  Your monitor has already been registered assigning a specific monitor serial # to you.   Applying the monitor   Shave hair from upper left chest.   Hold abrader disc by orange tab.  Rub abrader in 40 strokes over left upper chest as indicated in your monitor instructions.   Clean area with 4 enclosed alcohol pads .  Use all pads to assure are is cleaned thoroughly.  Let dry.   Apply patch as indicated in monitor instructions.  Patch will be place under collarbone on left side of chest with arrow  pointing upward.   Rub patch adhesive wings for 2 minutes.Remove white label marked "1".  Remove white label marked "2".  Rub patch adhesive wings for 2 additional minutes.   While looking in a mirror, press and release button in center of patch.  A small green light will flash 3-4 times .  This will be your only indicator the monitor has been turned on.     Do not shower for the first 24 hours.  You may shower after the first 24 hours.   Press button if you feel a symptom. You will hear a small click.  Record Date, Time and Symptom in the Patient Log Book.   When you are ready to remove patch, follow instructions on last 2 pages of Patient Log Book.  Stick patch monitor onto last page of Patient Log Book.   Place Patient Log Book in [place] box.  Use locking tab on box and tape box closed securely.  The [employer] has [employer] on it.  Please place in mailbox as soon as possible.  Your physician should have your test results approximately 7 days after the monitor has been mailed back to [facility].   Call [place] at [phone] if you have questions regarding your ZIO XT patch monitor.  Call them immediately if you see an orange light blinking on your monitor.   If your monitor falls off in less than 4 days contact our Monitor department at  [phone].  If your monitor becomes loose or falls off after 4 days call [employer] at [phone] for suggestions on securing your monitor.    Follow-Up: At [facility], you and your health needs are our priority.  As part of our continuing mission to provide you with exceptional heart care, we have created designated Provider Care Teams.  These Care Teams include your primary Cardiologist (physician) and Advanced Practice Providers (APPs -  Physician Assistants and Nurse Practitioners) who all work together to provide you with the care you need, when you need it.  We recommend signing up for the patient portal  called "MyChart".  Sign up information is provided on this After Visit Summary.  MyChart is used to connect with patients for Virtual Visits (Telemedicine).  Patients are able to view lab/test results, encounter notes, upcoming appointments, etc.  Non-urgent messages can be sent to your provider as well.   To learn more about what you can do with MyChart, go to [email or web address].    Your next appointment:   2 month(s)  The format for your next appointment:   In Person  Provider:   Oswaldo Milian, MD   Other Instructions  Hypertrophic Cardiomyopathy  Hypertrophic cardiomyopathy (HCM) is a heart condition in which part of the heart muscle gets too thick. The condition can cause a dangerous and abnormal heart rhythm. It can also weaken the heart over time. The heart is divided into four chambers. The thickening usually affects the pumping chamber on the lower left (left ventricle). HCM may also affect the valve that lets blood flow into the left ventricle (mitral valve). Symptoms often begin when a person is about 82 years old. What are the causes? This condition is usually caused by abnormal genes that control heart muscle growth. These abnormal genes are passed down through families (are inherited). What increases the risk? You are more likely to develop this condition if you have a family history of HCM. What are the signs or symptoms? Symptoms of this condition include:  Shortness of breath, especially after exercising or lying down.  Chest pain.  Dizziness.  Fatigue.  Irregular or fast heart rate.  Fainting, especially after physical activity. How is this diagnosed? This condition is diagnosed based on:  A physical exam that involves checking for an abnormal heart sound (heart murmur).  Tests, such as: ? An electrocardiogram (ECG). This is a test that records your heart's electrical activity. ? An echocardiogram. This test can show whether your left ventricle  is enlarged and whether it fills slowly. ? An exercise stress test. This test is done to collect information about how your heart functions during exercise. ? A Doppler test. This is a test that shows irregular blood flow and pressure differences inside the heart. ? A chest X-ray to see whether your heart is enlarged. ? An MRI. ? Genetic testing done on a blood sample. How is this treated? Treatment for HCM depends on how severe your symptoms are. There are several options for treatment, including:  Medicine. Medicines can be given to: ? Reduce the workload of your heart. ? Lower your blood pressure. ? Thin your blood and prevent clots.  A device. Devices that can be used to treat this condition include: ? A pacemaker. This device helps to control your heartbeat. ? A defibrillator. This device restores a normal heart rhythm.  Surgery. This may include a procedure to: ? Inject alcohol into the small blood vessels that supply your heart muscle (alcohol septal ablation).This  is done during a procedure called cardiac catheterization. The goal is to cause the muscle to become thinner. ? Remove part of the wall that divides the right and left sides of the heart (septum) with a procedure called surgical myectomy. ? Replace the mitral valve. Follow these instructions at home: Activity  Avoid strenuous exercise and activities, such as shoveling snow. Do not participate in high intensity activities, such as competitive sports.  Get regular physical activity. Most patients can participate in low to moderate intensity exercise such as walking. Ask your health care provider what activity level is safe for you.  Do not lift anything that is heavier than 10 lb (4.5 kg), or the limit that you are told, until your health care provider says that it is safe. Lifestyle  Maintain a healthy weight. If you need help with losing weight, ask your health care provider.  Eat a heart-healthy diet.  Do not  drink alcohol if: ? Your health care provider tells you not to drink. ? You are pregnant, may be pregnant, or are planning to become pregnant.  If you drink alcohol: ? Limit how much you use to:  0-1 drink a day for women.  0-2 drinks a day for men. ? Be aware of how much alcohol is in your drink. In the [place] one drink equals one 12 oz bottle of beer (355 mL), one 5 oz glass of wine (148 mL), or one 1 oz glass of hard liquor (44 mL).  Do not use any products that contain nicotine or tobacco, such as cigarettes, e-cigarettes, and chewing tobacco. If you need help quitting, ask your health care provider. General instructions  Make sure the members of your household know how to do CPR in case of an emergency.  Take over-the-counter and prescription medicines only as told by your health care provider.  Keep all follow-up visits as told by your health care provider. This is important. Contact a health care provider if:  You have new symptoms.  Your symptoms get worse. Get help right away if:  You have chest pain or shortness of breath, especially during or after sports.  You feel faint or you pass out.  You have trouble breathing even at rest.  Your feet or ankles swell.  Your heartbeat seems irregular or seems faster than normal (palpitations). These symptoms may represent a serious problem that is an emergency. Do not wait to see if the symptoms will go away. Get medical help right away. Call your local emergency services (911 in the [place] Do not drive yourself to the hospital. Summary  Hypertrophic cardiomyopathy (HCM) is a heart condition in which part of the heart muscle gets too thick.  The condition can cause a dangerous and abnormal heart rhythm, and it can weaken the heart over time.  Avoid strenuous exercise and activities, such as shoveling snow.  Make sure the members of your household know how to do CPR in case of an emergency.  Keep all follow-up visits as  told by your health care provider. This is important. This information is not intended to replace advice given to you by your health care provider. Make sure you discuss any questions you have with your health care provider. Document Revised: 09/30/2017 Document Reviewed: 09/30/2017 Elsevier Patient Education  [place].

## 2019-06-15 ENCOUNTER — Ambulatory Visit (HOSPITAL_COMMUNITY): Payer: Medicare HMO | Admitting: Physician Assistant

## 2019-06-18 ENCOUNTER — Ambulatory Visit (INDEPENDENT_AMBULATORY_CARE_PROVIDER_SITE_OTHER): Payer: Medicare HMO

## 2019-06-18 DIAGNOSIS — I480 Paroxysmal atrial fibrillation: Secondary | ICD-10-CM

## 2019-06-25 ENCOUNTER — Encounter: Payer: Self-pay | Admitting: Cardiology

## 2019-06-25 ENCOUNTER — Telehealth: Payer: Self-pay | Admitting: Cardiology

## 2019-06-25 NOTE — Telephone Encounter (Signed)
Spoke with patient regarding appointment for Cardiac MRI scheduled Monday 07/05/19 at 11:00 am at [facility]---arrival time is 10:15 am 1st floor admissions office----will mail informatoin to patient and it is also available in My Chart.  Patient voiced her understanding.

## 2019-07-02 ENCOUNTER — Telehealth (HOSPITAL_COMMUNITY): Payer: Self-pay | Admitting: *Deleted

## 2019-07-02 NOTE — Telephone Encounter (Signed)
Reaching out to patient to offer assistance regarding upcoming cardiac imaging study; pt verbalizes understanding of appt date/time, parking situation and where to check in, and verified current allergies; name and call back number provided for further questions should they arise  Findley Vi Tai RN Navigator Cardiac Imaging [facility] [phone] office [phone] cell 

## 2019-07-05 ENCOUNTER — Ambulatory Visit (HOSPITAL_COMMUNITY)
Admission: RE | Admit: 2019-07-05 | Discharge: 2019-07-05 | Disposition: A | Discharge status: Home / Self Care | Payer: Medicare HMO | Source: Ambulatory Visit | Attending: Cardiology | Admitting: Cardiology

## 2019-07-05 ENCOUNTER — Other Ambulatory Visit: Payer: Self-pay

## 2019-07-05 DIAGNOSIS — I422 Other hypertrophic cardiomyopathy: Secondary | ICD-10-CM | POA: Insufficient documentation

## 2019-07-05 MED ORDER — GADOBUTROL 1 MMOL/ML IV SOLN
10.0000 mL | Freq: Once | INTRAVENOUS | Status: AC | PRN
  Administered 2019-07-05: 10 mL via INTRAVENOUS

## 2019-07-13 DIAGNOSIS — I480 Paroxysmal atrial fibrillation: Secondary | ICD-10-CM | POA: Diagnosis not present

## 2019-07-19 ENCOUNTER — Telehealth: Payer: Self-pay | Admitting: Cardiology

## 2019-07-19 ENCOUNTER — Other Ambulatory Visit: Payer: Self-pay | Admitting: *Deleted

## 2019-07-19 MED ORDER — DILTIAZEM HCL ER COATED BEADS 300 MG PO CP24: 300.0000 mg | ORAL_CAPSULE | Freq: Every day | ORAL | 0 refills | Status: DC

## 2019-07-19 NOTE — Telephone Encounter (Signed)
Pt c/o medication issue:  1. Name of Medication: diltiazem (CARDIZEM CD) 240 MG 24 hr capsule  2. How are you currently taking this medication (dosage and times per day)? 240 mg  3. Are you having a reaction (difficulty breathing--STAT)?  no 4. What is your medication issue? PT is OK with the change in her medications. She got a message from [place] earlier and wanted to call to let her know

## 2019-07-19 NOTE — Telephone Encounter (Signed)
Returned call to patient, she is aware of results and medication recommendations.  rx sent to pharmacy.

## 2019-07-27 DIAGNOSIS — Z85828 Personal history of other malignant neoplasm of skin: Secondary | ICD-10-CM | POA: Diagnosis not present

## 2019-07-27 DIAGNOSIS — L814 Other melanin hyperpigmentation: Secondary | ICD-10-CM | POA: Diagnosis not present

## 2019-07-27 DIAGNOSIS — D1801 Hemangioma of skin and subcutaneous tissue: Secondary | ICD-10-CM | POA: Diagnosis not present

## 2019-07-27 DIAGNOSIS — B078 Other viral warts: Secondary | ICD-10-CM | POA: Diagnosis not present

## 2019-07-27 DIAGNOSIS — L570 Actinic keratosis: Secondary | ICD-10-CM | POA: Diagnosis not present

## 2019-07-27 DIAGNOSIS — D485 Neoplasm of uncertain behavior of skin: Secondary | ICD-10-CM | POA: Diagnosis not present

## 2019-08-11 ENCOUNTER — Ambulatory Visit: Payer: Medicare HMO | Admitting: Cardiology

## 2019-08-11 ENCOUNTER — Other Ambulatory Visit: Payer: Self-pay

## 2019-08-11 ENCOUNTER — Encounter: Payer: Self-pay | Admitting: Cardiology

## 2019-08-11 VITALS — BP 171/90 | HR 80 | Ht 64.0 in | Wt 143.4 lb

## 2019-08-11 DIAGNOSIS — I480 Paroxysmal atrial fibrillation: Secondary | ICD-10-CM | POA: Diagnosis not present

## 2019-08-11 DIAGNOSIS — I422 Other hypertrophic cardiomyopathy: Secondary | ICD-10-CM | POA: Diagnosis not present

## 2019-08-11 DIAGNOSIS — I471 Supraventricular tachycardia: Secondary | ICD-10-CM | POA: Diagnosis not present

## 2019-08-11 DIAGNOSIS — E785 Hyperlipidemia, unspecified: Secondary | ICD-10-CM

## 2019-08-11 DIAGNOSIS — I10 Essential (primary) hypertension: Secondary | ICD-10-CM

## 2019-08-11 MED ORDER — DILTIAZEM HCL ER COATED BEADS 360 MG PO CP24: 360.0000 mg | ORAL_CAPSULE | Freq: Every day | ORAL | 3 refills | Status: DC

## 2019-08-11 NOTE — Patient Instructions (Signed)
Medication Instructions:  INCREASE diltiazem to 360 mg daily   *If you need a refill on your cardiac medications before your next appointment, please call your pharmacy*  Follow-Up: At [facility], you and your health needs are our priority.  As part of our continuing mission to provide you with exceptional heart care, we have created designated Provider Care Teams.  These Care Teams include your primary Cardiologist (physician) and Advanced Practice Providers (APPs -  Physician Assistants and Nurse Practitioners) who all work together to provide you with the care you need, when you need it.  We recommend signing up for the patient portal called "MyChart".  Sign up information is provided on this After Visit Summary.  MyChart is used to connect with patients for Virtual Visits (Telemedicine).  Patients are able to view lab/test results, encounter notes, upcoming appointments, etc.  Non-urgent messages can be sent to your provider as well.   To learn more about what you can do with MyChart, go to [email or web address].    Your next appointment:    Atrial fib clinic in 2-3 weeks 3 months with Dr. Gardiner Rhyme  Other Instructions Please check your blood pressure at home daily, write it down.  Call the office or send message via Mychart with the readings in 2 weeks for Dr. Gardiner Rhyme to review.

## 2019-08-11 NOTE — Progress Notes (Signed)
Cardiology Office Note:    Date:  08/14/2019   ID:  Paula [name], DOB 1936/01/21, MRN [id number]  PCP:  Lajean Manes, MD  Cardiologist:  No primary care provider on file.  Electrophysiologist:  None   Referring MD: Lajean Manes, MD   Chief Complaint  Patient presents with  . Cardiomyopathy    History of Present Illness:    Paula [name] is a 84 y.o. female with a hx of paroxysmal atrial fibrillation, hypertension, hypothyroidism who is referred by [name], PA for evaluation of hypertrophic cardiomyopathy.    Diagnosed with atrial fibrillation after presenting with palpitations to PCP office in April. EKG showed A. fib with RVR. Started on Eliquis given CHA2DS2-VASc score 5.  Also started on diltiazem for rate control. She was seen in AF clinic on 05/11/2019, she was noted to be in normal sinus rhythm. She was continued on Eliquis 5 mg twice daily and diltiazem 180 mg daily. TTE on 05/27/19 was concerning for apical variant hypertrophic cardiomyopathy, EF 60 to 65%, normal RV function, moderate elevation in RVSP (48 mmHg), mild mitral regurgitation, mild to moderate tricuspid regurgitation.  Cardiac MRI on 07/05/2019 showed LV apical hypertrophy measuring up to 15 mm, consistent with apical hypertrophic cardiomyopathy, small apical aneurysm, patchy apical LGE accounting for 1% of total myocardial mass, hyperdynamic LV systolic function, normal RV function, mild MR (regurgitant fraction 24%).  Zio patch x7 days on 07/14/2019 showed AF burden 47%, with episode lasting 3 days and average rate 118 bpm, no NSVT, frequent episodes of SVT, longest lasting 19 seconds.  Since last clinic visit, she reports that she continues to have intermittent atrial fibrillation.  States that she was in AF from Thursday through Sunday last week, with rates 120s to 140s.  She felt fatigued but otherwise no symptoms.  Denies any recent syncope, does report intermittent lightheadedness.  Denies  any chest pain or dyspnea.  Reports on 7/11 she woke up feeling weak and diaphoretic.  States that vital signs were normal.  Lasted for hours that day and resolved.   Past Medical History:  Diagnosis Date  . Irregular heart rate   . Thyroid disease     No past surgical history on file.  Current Medications: Current Meds  Medication Sig  . alendronate (FOSAMAX) 70 MG tablet   . atorvastatin (LIPITOR) 10 MG tablet Take 10 mg by mouth daily at 6 PM.  . calcium carbonate (OS-CAL - DOSED IN MG OF ELEMENTAL CALCIUM) 1250 (500 Ca) MG tablet Take 1 tablet by mouth daily with breakfast.  . diltiazem (CARDIZEM CD) 360 MG 24 hr capsule Take 1 capsule (360 mg total) by mouth daily.  Marland Kitchen ELIQUIS 5 MG TABS tablet Take 5 mg by mouth in the morning and at bedtime.  . famotidine (PEPCID) 10 MG tablet Take 10 mg by mouth at bedtime.  . hydrochlorothiazide (MICROZIDE) 12.5 MG capsule   . levothyroxine (SYNTHROID, LEVOTHROID) 88 MCG tablet Take 88 mcg by mouth daily before breakfast.  . Multiple Vitamin (MULTIVITAMIN WITH MINERALS) TABS tablet Take 1 tablet by mouth daily.  . sodium chloride (OCEAN) 0.65 % SOLN nasal spray Place 1 spray into both nostrils as needed for congestion.  . [DISCONTINUED] diltiazem (CARDIZEM CD) 300 MG 24 hr capsule Take 1 capsule (300 mg total) by mouth daily.     Allergies:   Patient has no known allergies.   Social History   Socioeconomic History  . Marital status: Married    Spouse name: Not  on file  . Number of children: Not on file  . Years of education: Not on file  . Highest education level: Not on file  Occupational History  . Not on file  Tobacco Use  . Smoking status: Never Smoker  . Smokeless tobacco: Never Used  Substance and Sexual Activity  . Alcohol use: Yes    Alcohol/week: 1.0 standard drink    Types: 1 Glasses of wine per week  . Drug use: No  . Sexual activity: Not on file  Other Topics Concern  . Not on file  Social History Narrative  . Not  on file   Social Determinants of Health   Financial Resource Strain:   . Difficulty of Paying Living Expenses:   Food Insecurity:   . Worried About Charity fundraiser in the Last Year:   . Arboriculturist in the Last Year:   Transportation Needs:   . Film/video editor (Medical):   Marland Kitchen Lack of Transportation (Non-Medical):   Physical Activity:   . Days of Exercise per Week:   . Minutes of Exercise per Session:   Stress:   . Feeling of Stress :   Social Connections:   . Frequency of Communication with Friends and Family:   . Frequency of Social Gatherings with Friends and Family:   . Attends Religious Services:   . Active Member of Clubs or Organizations:   . Attends Archivist Meetings:   Marland Kitchen Marital Status:      Family History: The patient's family history includes Breast cancer (age of onset: 50) in her sister.  ROS:   Please see the history of present illness.     All other systems reviewed and are negative.  EKGs/Labs/Other Studies Reviewed:    The following studies were reviewed today:   EKG:  EKG is ordered today.  The ekg ordered today demonstrates sinus rhythm, rate 80, LVH with repolarization abnormalities  Recent Labs: No results found for requested labs within last 8760 hours.  Recent Lipid Panel No results found for: CHOL, TRIG, HDL, CHOLHDL, VLDL, LDLCALC, LDLDIRECT  Physical Exam:    VS:  BP (!) 171/90   Pulse 80   Ht 5\' 4"  (1.626 m)   Wt 143 lb 6.4 oz (65 kg)   SpO2 98%   BMI 24.61 kg/m     Wt Readings from Last 3 Encounters:  08/11/19 143 lb 6.4 oz (65 kg)  06/14/19 142 lb (64.4 kg)  05/11/19 140 lb 12.8 oz (63.9 kg)     GEN: in no acute distress HEENT: Normal NECK: No JVD; No carotid bruits CARDIAC: tachycardic, irregular, no murmurs, rubs, gallops RESPIRATORY:  Clear to auscultation without rales, wheezing or rhonchi  ABDOMEN: Soft, non-tender, non-distended MUSCULOSKELETAL:  No edema; No deformity  SKIN: Warm and  dry NEUROLOGIC:  Alert and oriented x 3 PSYCHIATRIC:  Normal affect   ASSESSMENT:    1. Hypertrophic cardiomyopathy (HCC)   2. Paroxysmal atrial fibrillation ([place])   3. Essential hypertension   4. Hyperlipidemia, unspecified hyperlipidemia type   5. SVT (supraventricular tachycardia) (HCC)    PLAN:    Hypertrophic cardiomyopathy: cardiac MRI on 07/05/2019 showed LV apical hypertrophy measuring up to 15 mm, consistent with apical hypertrophic cardiomyopathy, small apical aneurysm, patchy apical LGE accounting for 1% of total myocardial mass.  No NSVT on Zio patch x 7 days -Would recommend her son and sister be screened for HCM  Paroxysmal atrial fibrillation: Diagnosed 04/2019 after presenting to PCPs  office with palpitations. CHA2DS2-VASc score 5 -Continue Eliquis 5 mg twice daily -Zio patch x7 days on 07/14/2019 showed AF burden 47%, with episode lasting 3 days and average rate 118 bpm.  Diltiazem increased to 300 mg daily.  Reports rates continue to be elevated, will increase to 360 mg daily.  Follow-up with AF clinic  SVT: Frequent episodes on recent ZIO monitoring, longest lasting 19 seconds.  Continue diltiazem  Hypertension: On hydrochlorothiazide 12.5 mg daily and diltiazem 300 mg daily.  Elevated in clinic today.  Increase diltiazem to 360 mg daily as above.  Asked patient to check BP daily for next 2 weeks and call with results.  Hyperlipidemia: On atorvastatin 10 mg daily  RTC in 3 months  Medication Adjustments/Labs and Tests Ordered: Current medicines are reviewed at length with the patient today.  Concerns regarding medicines are outlined above.  Orders Placed This Encounter  Procedures  . EKG 12-Lead   Meds ordered this encounter  Medications  . diltiazem (CARDIZEM CD) 360 MG 24 hr capsule    Sig: Take 1 capsule (360 mg total) by mouth daily.    Dispense:  90 capsule    Refill:  3    Dose increase    Patient Instructions  Medication Instructions:  INCREASE  diltiazem to 360 mg daily   *If you need a refill on your cardiac medications before your next appointment, please call your pharmacy*  Follow-Up: At [facility], you and your health needs are our priority.  As part of our continuing mission to provide you with exceptional heart care, we have created designated Provider Care Teams.  These Care Teams include your primary Cardiologist (physician) and Advanced Practice Providers (APPs -  Physician Assistants and Nurse Practitioners) who all work together to provide you with the care you need, when you need it.  We recommend signing up for the patient portal called "MyChart".  Sign up information is provided on this After Visit Summary.  MyChart is used to connect with patients for Virtual Visits (Telemedicine).  Patients are able to view lab/test results, encounter notes, upcoming appointments, etc.  Non-urgent messages can be sent to your provider as well.   To learn more about what you can do with MyChart, go to [email or web address].    Your next appointment:    Atrial fib clinic in 2-3 weeks 3 months with Dr. Gardiner Rhyme  Other Instructions Please check your blood pressure at home daily, write it down.  Call the office or send message via Mychart with the readings in 2 weeks for Dr. Gardiner Rhyme to review.      Signed, Donato Heinz, MD  08/14/2019 11:40 AM    [place]

## 2019-08-13 ENCOUNTER — Other Ambulatory Visit: Payer: Self-pay | Admitting: Cardiology

## 2019-08-18 DIAGNOSIS — I10 Essential (primary) hypertension: Secondary | ICD-10-CM

## 2019-08-31 ENCOUNTER — Encounter (HOSPITAL_COMMUNITY): Payer: Self-pay | Admitting: Physician Assistant

## 2019-08-31 ENCOUNTER — Ambulatory Visit (HOSPITAL_COMMUNITY)
Admission: RE | Admit: 2019-08-31 | Discharge: 2019-08-31 | Disposition: A | Discharge status: Home / Self Care | Payer: Medicare HMO | Source: Ambulatory Visit | Attending: Physician Assistant | Admitting: Physician Assistant

## 2019-08-31 ENCOUNTER — Other Ambulatory Visit: Payer: Self-pay

## 2019-08-31 VITALS — BP 132/80 | HR 110 | Ht 64.0 in | Wt 142.4 lb

## 2019-08-31 DIAGNOSIS — Z79890 Hormone replacement therapy (postmenopausal): Secondary | ICD-10-CM | POA: Diagnosis not present

## 2019-08-31 DIAGNOSIS — I422 Other hypertrophic cardiomyopathy: Secondary | ICD-10-CM | POA: Diagnosis not present

## 2019-08-31 DIAGNOSIS — I10 Essential (primary) hypertension: Secondary | ICD-10-CM | POA: Insufficient documentation

## 2019-08-31 DIAGNOSIS — Z7901 Long term (current) use of anticoagulants: Secondary | ICD-10-CM | POA: Diagnosis not present

## 2019-08-31 DIAGNOSIS — D6869 Other thrombophilia: Secondary | ICD-10-CM | POA: Diagnosis not present

## 2019-08-31 DIAGNOSIS — I700 Atherosclerosis of aorta: Secondary | ICD-10-CM | POA: Insufficient documentation

## 2019-08-31 DIAGNOSIS — E039 Hypothyroidism, unspecified: Secondary | ICD-10-CM | POA: Insufficient documentation

## 2019-08-31 DIAGNOSIS — I4819 Other persistent atrial fibrillation: Secondary | ICD-10-CM | POA: Insufficient documentation

## 2019-08-31 DIAGNOSIS — Z79899 Other long term (current) drug therapy: Secondary | ICD-10-CM | POA: Diagnosis not present

## 2019-08-31 MED ORDER — AMIODARONE HCL 200 MG PO TABS: ORAL_TABLET | ORAL | 0 refills | Status: DC

## 2019-08-31 NOTE — Progress Notes (Signed)
Primary Care Physician: Lajean Manes, MD Primary Cardiologist: Dr Gardiner Rhyme Primary Electrophysiologist: none Referring Physician: Dr Elmer Picker Paula [name] is a 84 y.o. female with a history of HTN, hypothyroidism, aortic atherosclerosis, and paroxysmal atrial fibrillation who presents for follow up in the [facility].  The patient was initially diagnosed with atrial fibrillation 05/05/19 after presenting with symptoms of palpitations to her PCP office. ECG showed afib with RVR. Patient was started on Eliquis for a CHADS2VASC score of 5 and diltiazem for rate control. Patient has reported palpitations in the past and has worn cardiac monitors but this was the first captured afib episode. Echocardiogram showed EF 60-65% with LV hypertrophy of apical segment. MRI 07/05/19 confirmed HCM.  On follow up today, patient has continued to have very frequent episodes of afib, she appears persistent now. Her diltiazem has been up titrated. She does report dizziness with elevated heart rates. She denies bleeding issues on anticoagulation.   Today, she denies symptoms of palpitations, chest pain, shortness of breath, orthopnea, PND, lower extremity edema, dizziness, presyncope, syncope, snoring, daytime somnolence, bleeding, or neurologic sequela. The patient is tolerating medications without difficulties and is otherwise without complaint today.    Atrial Fibrillation Risk Factors:  she does not have symptoms or diagnosis of sleep apnea. she does not have a history of rheumatic fever. she does not have a history of alcohol use. The patient does not have a history of early familial atrial fibrillation or other arrhythmias.  she has a BMI of Body mass index is 24.44 kg/m.Marland Kitchen Filed Weights   08/31/19 1003  Weight: 64.6 kg    Family History  Problem Relation Age of Onset  . Breast cancer Sister 56     Atrial Fibrillation Management history:  Previous  antiarrhythmic drugs: none Previous cardioversions: none Previous ablations: none CHADS2VASC score: 5 Anticoagulation history: Eliquis   Past Medical History:  Diagnosis Date  . Irregular heart rate   . Thyroid disease    No past surgical history on file.  Current Outpatient Medications  Medication Sig Dispense Refill  . alendronate (FOSAMAX) 70 MG tablet     . atorvastatin (LIPITOR) 10 MG tablet Take 10 mg by mouth daily at 6 PM.    . calcium carbonate (OS-CAL - DOSED IN MG OF ELEMENTAL CALCIUM) 1250 (500 Ca) MG tablet Take 1 tablet by mouth daily with breakfast.    . diltiazem (CARDIZEM CD) 360 MG 24 hr capsule Take 1 capsule (360 mg total) by mouth daily. 90 capsule 3  . ELIQUIS 5 MG TABS tablet Take 5 mg by mouth in the morning and at bedtime.    . famotidine (PEPCID) 10 MG tablet Take 10 mg by mouth at bedtime.    . hydrochlorothiazide (MICROZIDE) 12.5 MG capsule Take 25 mg by mouth daily.    Marland Kitchen levothyroxine (SYNTHROID, LEVOTHROID) 88 MCG tablet Take 88 mcg by mouth daily before breakfast.    . Multiple Vitamin (MULTIVITAMIN WITH MINERALS) TABS tablet Take 1 tablet by mouth daily.    . sodium chloride (OCEAN) 0.65 % SOLN nasal spray Place 1 spray into both nostrils as needed for congestion.    Marland Kitchen amiodarone (PACERONE) 200 MG tablet Take one tablet twice daily for 1 month and then decrease to 1 tablet by mouth daily 60 tablet 0   No current facility-administered medications for this encounter.    No Known Allergies  Social History   Socioeconomic History  . Marital status: Married  Spouse name: Not on file  . Number of children: Not on file  . Years of education: Not on file  . Highest education level: Not on file  Occupational History  . Not on file  Tobacco Use  . Smoking status: Never Smoker  . Smokeless tobacco: Never Used  Substance and Sexual Activity  . Alcohol use: Yes    Alcohol/week: 1.0 standard drink    Types: 1 Glasses of wine per week  . Drug use:  No  . Sexual activity: Not on file  Other Topics Concern  . Not on file  Social History Narrative  . Not on file   Social Determinants of Health   Financial Resource Strain:   . Difficulty of Paying Living Expenses:   Food Insecurity:   . Worried About Charity fundraiser in the Last Year:   . Arboriculturist in the Last Year:   Transportation Needs:   . Film/video editor (Medical):   Marland Kitchen Lack of Transportation (Non-Medical):   Physical Activity:   . Days of Exercise per Week:   . Minutes of Exercise per Session:   Stress:   . Feeling of Stress :   Social Connections:   . Frequency of Communication with Friends and Family:   . Frequency of Social Gatherings with Friends and Family:   . Attends Religious Services:   . Active Member of Clubs or Organizations:   . Attends Archivist Meetings:   Marland Kitchen Marital Status:   Intimate Partner Violence:   . Fear of Current or Ex-Partner:   . Emotionally Abused:   Marland Kitchen Physically Abused:   . Sexually Abused:      ROS- All systems are reviewed and negative except as per the HPI above.  Physical Exam: Vitals:   08/31/19 1003  BP: 132/80  Pulse: (!) 110  Weight: 64.6 kg  Height: 5\' 4"  (1.626 m)    GEN- The patient is well appearing elderly female, alert and oriented x 3 today.   HEENT-head normocephalic, atraumatic, sclera clear, conjunctiva pink, hearing intact, trachea midline. Lungs- Clear to ausculation bilaterally, normal work of breathing Heart- irregular rate and rhythm, no murmurs, rubs or gallops  GI- soft, NT, ND, + BS Extremities- no clubbing, cyanosis, or edema MS- no significant deformity or atrophy Skin- no rash or lesion Psych- euthymic mood, full affect Neuro- strength and sensation are intact   Wt Readings from Last 3 Encounters:  08/31/19 64.6 kg  08/11/19 65 kg  06/14/19 64.4 kg    EKG today demonstrates afib HR 110, QRS 88, QTc 508  Echo 05/27/19 demonstrated 1. Apical variant hypertrophic  cardiomyopathy with no evidence of apical  thrombus. Left ventricular ejection fraction, by estimation, is 60 to 65%.  The left ventricle has normal function. The left ventricle demonstrates  regional wall motion  abnormalities (see scoring diagram/findings for description). There is  severe left ventricular hypertrophy of the apical segment. Left  ventricular diastolic parameters are consistent with Grade I diastolic  dysfunction (impaired relaxation).  2. Right ventricular systolic function is normal. The right ventricular  size is normal. There is moderately elevated pulmonary artery systolic  pressure. The estimated right ventricular systolic pressure is 81.4 mmHg.  3. Left atrial size was mildly dilated.  4. The mitral valve is normal in structure. Mild mitral valve  regurgitation. No evidence of mitral stenosis.  5. Tricuspid valve regurgitation is mild to moderate.  6. Possible fusion of right and non coronary cusp.  The aortic valve is  normal in structure. Aortic valve regurgitation is not visualized. Mild to  moderate aortic valve sclerosis/calcification is present, without any  evidence of aortic stenosis.  7. The inferior vena cava is normal in size with greater than 50%  respiratory variability, suggesting right atrial pressure of 3 mmHg.   Epic records are reviewed at length today  Labs from PCP office 05/05/19 TSH 0.59 Cr 0.90, K+ 4.0, AST 25, ALT 20 WBC 7.7, Hgb 14.2, HCT 42.0   CHA2DS2-VASc Score = 5  The patient's score is based upon: CHF History: 0 HTN History: 1 Age : 2 Diabetes History: 0 Stroke History: 0 Vascular Disease History: 1 Gender: 1      ASSESSMENT AND PLAN: 1. Persistent Atrial Fibrillation  The patient's CHA2DS2-VASc score is 5, indicating a 7.2% annual risk of stroke.   Patient having frequent episodes of afib with elevated heart rates at times. We discussed therapeutic options today including AAD therapy. Will plan to start  amiodarone 200 mg BID for 4 weeks then decrease to 200 mg daily. If she is persistently out of rhythm will consider DCCV. Recheck TSH/Cmet at follow up. Continue diltiazem 360 mg daily.  2. Secondary Hypercoagulable State (ICD10:  D68.69) The patient is at significant risk for stroke/thromboembolism based upon her CHA2DS2-VASc Score of 5.  Continue Apixaban (Eliquis).   3. HTN Stable, no changes today.   Follow up in the AF clinic in 3 weeks.    [facility] [address] [place], [place] [address] [phone] 08/31/2019 6:04 PM

## 2019-08-31 NOTE — Patient Instructions (Signed)
Start Amiodarone 200mg  - take 1 tablet twice daily for one month and then decrease to one tablet by mouth daily

## 2019-09-08 ENCOUNTER — Other Ambulatory Visit: Payer: Self-pay

## 2019-09-08 DIAGNOSIS — I480 Paroxysmal atrial fibrillation: Secondary | ICD-10-CM | POA: Diagnosis not present

## 2019-09-08 DIAGNOSIS — E039 Hypothyroidism, unspecified: Secondary | ICD-10-CM | POA: Diagnosis not present

## 2019-09-08 DIAGNOSIS — I10 Essential (primary) hypertension: Secondary | ICD-10-CM

## 2019-09-08 DIAGNOSIS — D6869 Other thrombophilia: Secondary | ICD-10-CM | POA: Diagnosis not present

## 2019-09-08 LAB — BASIC METABOLIC PANEL
BUN/Creatinine Ratio: 9 — ABNORMAL LOW (ref 12–28)
BUN: 9 mg/dL (ref 8–27)
CO2: 24 mmol/L (ref 20–29)
Calcium: 9.7 mg/dL (ref 8.7–10.3)
Chloride: 95 mmol/L — ABNORMAL LOW (ref 96–106)
Creatinine, Ser: 1.02 mg/dL — ABNORMAL HIGH (ref 0.57–1.00)
GFR calc Af Amer: 60 mL/min/{1.73_m2} (ref >59)
GFR calc non Af Amer: 52 mL/min/{1.73_m2} — ABNORMAL LOW (ref >59)
Glucose: 79 mg/dL (ref 65–99)
Potassium: 3.9 mmol/L (ref 3.5–5.2)
Sodium: 136 mmol/L (ref 134–144)

## 2019-09-14 ENCOUNTER — Telehealth: Payer: Self-pay | Admitting: Cardiology

## 2019-09-14 NOTE — Telephone Encounter (Signed)
Spoke to patient she stated she sent B/P readings for Dr.Schumaan to review.Advised I will send message to him.

## 2019-09-14 NOTE — Telephone Encounter (Signed)
BP readings:  8/11 am 110/73 HR 99, pm 122/85 HR 114 8/12 am 121/65 HR 70, pm 134/77 HR 81 8/13 am 121/68 HR 67, pm 133/70 HR 81 8/14 am 110/59 HR 63, pm 130/74 HR 77 8/15 am 115/64 HR 64, pm 134/72 HR 80 8/16 am 120/67 HR 67, pm 116/64 HR 62 8/17 am 116/61 HR 61, pm 120/64 HR 63  8/18 am 127/65 HR 60, pm 133/70 HR 69 8/19 am 113/59 HR 57, pm 135/69 HR 66 8/20 am 119/63 HR 60, pm 120/67 HR 73 8/21  am 115/59 HR 57, pm 123/70 HR 68 8/22 am 116/54 HR 56, pm 136/72 HR 63 8/23 am 122/67 HR 57, pm 132/69 HR 72 8/24 am 116/59 HR 52, pm 124/63 HR 76

## 2019-09-14 NOTE — Telephone Encounter (Signed)
Attempted to return call to patient. Left message for patient to call back.  

## 2019-09-14 NOTE — Telephone Encounter (Signed)
    Pt returning call from [place]

## 2019-09-15 ENCOUNTER — Telehealth: Payer: Self-pay | Admitting: Cardiology

## 2019-09-15 ENCOUNTER — Telehealth (HOSPITAL_COMMUNITY): Payer: Self-pay

## 2019-09-15 NOTE — Telephone Encounter (Signed)
BP appears well controlled, continue current meds

## 2019-09-15 NOTE — Telephone Encounter (Signed)
Returned call to patient she stated she ate barbeque for lunch today.Stated she has not felt good,fullness in head she checked B/P and it is elevated.Readings listed below.Stated she is taking medications as prescribed and has not missed any doses.Advised to rest and check B/P again tonight and in the morning.Advised to call back if continues to be elevated.Advised I will send message to [place].

## 2019-09-15 NOTE — Telephone Encounter (Signed)
Pt c/o BP issue: STAT if pt c/o blurred vision, one-sided weakness or slurred speech  1. What are your last 5 BP readings?  8/25 305pm 175/85 HR 74  8/25 400pm 183/90 HR 79  2. Are you having any other symptoms (ex. Dizziness, headache, blurred vision, passed out)? She felt kind of dizzy most of the day, as well as tired.   3. What is your BP issue? BP was high today.

## 2019-09-15 NOTE — Telephone Encounter (Signed)
Patient states that she has not felt good all day. Took her BP at 3:05pm 175/85 then at 3:56pm 183/90 and pulse 79 with no Afib symptoms. I ask Marzetta Board about this she stated that the patient needs to reach out to her Cardiologist he has made adjustments to her HTN medications.

## 2019-09-17 ENCOUNTER — Other Ambulatory Visit: Payer: Self-pay

## 2019-09-17 MED ORDER — DILTIAZEM HCL ER COATED BEADS 360 MG PO CP24: 360.0000 mg | ORAL_CAPSULE | Freq: Every day | ORAL | 3 refills | Status: DC

## 2019-09-17 NOTE — Telephone Encounter (Signed)
See other phone notes.

## 2019-09-17 NOTE — Telephone Encounter (Signed)
Spoke to patient-she states she if feeling much better today.  She states she has not checked her BP today but she can tell it is much better.  She states she ate bbq and corn on the cob with salt.   Advised to monitor salt intake, continue to monitor BP.   OV 8/31 with Afib clinic.  Patient aware.

## 2019-09-17 NOTE — Telephone Encounter (Signed)
Agree with plan, can we check in to see how she is doing today?

## 2019-09-20 ENCOUNTER — Other Ambulatory Visit: Payer: Self-pay

## 2019-09-21 ENCOUNTER — Ambulatory Visit (HOSPITAL_COMMUNITY)
Admission: RE | Admit: 2019-09-21 | Discharge: 2019-09-21 | Disposition: A | Discharge status: Home / Self Care | Payer: Medicare HMO | Source: Ambulatory Visit | Attending: Physician Assistant | Admitting: Physician Assistant

## 2019-09-21 ENCOUNTER — Other Ambulatory Visit: Payer: Self-pay

## 2019-09-21 ENCOUNTER — Encounter (HOSPITAL_COMMUNITY): Payer: Self-pay | Admitting: Physician Assistant

## 2019-09-21 VITALS — BP 146/64 | HR 62 | Ht 64.0 in | Wt 141.8 lb

## 2019-09-21 DIAGNOSIS — I10 Essential (primary) hypertension: Secondary | ICD-10-CM | POA: Diagnosis not present

## 2019-09-21 DIAGNOSIS — E039 Hypothyroidism, unspecified: Secondary | ICD-10-CM | POA: Diagnosis not present

## 2019-09-21 DIAGNOSIS — I700 Atherosclerosis of aorta: Secondary | ICD-10-CM | POA: Insufficient documentation

## 2019-09-21 DIAGNOSIS — Z7901 Long term (current) use of anticoagulants: Secondary | ICD-10-CM | POA: Insufficient documentation

## 2019-09-21 DIAGNOSIS — Z79899 Other long term (current) drug therapy: Secondary | ICD-10-CM | POA: Insufficient documentation

## 2019-09-21 DIAGNOSIS — I4819 Other persistent atrial fibrillation: Secondary | ICD-10-CM | POA: Diagnosis not present

## 2019-09-21 DIAGNOSIS — D6869 Other thrombophilia: Secondary | ICD-10-CM | POA: Diagnosis not present

## 2019-09-21 LAB — COMPREHENSIVE METABOLIC PANEL
ALT: 25 U/L (ref 0–44)
AST: 31 U/L (ref 15–41)
Albumin: 4.3 g/dL (ref 3.5–5.0)
Alkaline Phosphatase: 67 U/L (ref 38–126)
Anion gap: 12 (ref 5–15)
BUN: 10 mg/dL (ref 8–23)
CO2: 28 mmol/L (ref 22–32)
Calcium: 9.9 mg/dL (ref 8.9–10.3)
Chloride: 93 mmol/L — ABNORMAL LOW (ref 98–111)
Creatinine, Ser: 1.1 mg/dL — ABNORMAL HIGH (ref 0.44–1.00)
GFR calc Af Amer: 55 mL/min — ABNORMAL LOW (ref >60)
GFR calc non Af Amer: 47 mL/min — ABNORMAL LOW (ref >60)
Glucose, Bld: 93 mg/dL (ref 70–99)
Potassium: 3.6 mmol/L (ref 3.5–5.1)
Sodium: 133 mmol/L — ABNORMAL LOW (ref 135–145)
Total Bilirubin: 0.9 mg/dL (ref 0.3–1.2)
Total Protein: 7.6 g/dL (ref 6.5–8.1)

## 2019-09-21 LAB — TSH: TSH: 4.624 u[IU]/mL — ABNORMAL HIGH (ref 0.350–4.500)

## 2019-09-21 MED ORDER — AMIODARONE HCL 200 MG PO TABS: 200.0000 mg | ORAL_TABLET | Freq: Every day | ORAL | 1 refills | Status: DC

## 2019-09-21 NOTE — Progress Notes (Signed)
Primary Care Physician: Lajean Manes, MD Primary Cardiologist: Dr Gardiner Rhyme Primary Electrophysiologist: none Referring Physician: Dr Elmer Picker Paula [name] is a 84 y.o. female with a history of HTN, hypothyroidism, aortic atherosclerosis, and paroxysmal atrial fibrillation who presents for follow up in the [facility].  The patient was initially diagnosed with atrial fibrillation 05/05/19 after presenting with symptoms of palpitations to her PCP office. ECG showed afib with RVR. Patient was started on Eliquis for a CHADS2VASC score of 5 and diltiazem for rate control. Patient has reported palpitations in the past and has worn cardiac monitors but this was the first captured afib episode. Echocardiogram showed EF 60-65% with LV hypertrophy of apical segment. MRI 07/05/19 confirmed HCM.  On follow up today, patient reports she had done well since her last visit. She converted to SR on amiodarone. She is tolerating the medication without difficulty. She denies bleeding issues on anticoagulation.   Today, she denies symptoms of palpitations, chest pain, shortness of breath, orthopnea, PND, lower extremity edema, dizziness, presyncope, syncope, snoring, daytime somnolence, bleeding, or neurologic sequela. The patient is tolerating medications without difficulties and is otherwise without complaint today.    Atrial Fibrillation Risk Factors:  she does not have symptoms or diagnosis of sleep apnea. she does not have a history of rheumatic fever. she does not have a history of alcohol use. The patient does not have a history of early familial atrial fibrillation or other arrhythmias.  she has a BMI of Body mass index is 24.34 kg/m.Marland Kitchen Filed Weights   09/21/19 1057  Weight: 64.3 kg    Family History  Problem Relation Age of Onset  . Breast cancer Sister 3     Atrial Fibrillation Management history:  Previous antiarrhythmic drugs:  amiodarone Previous cardioversions: none Previous ablations: none CHADS2VASC score: 5 Anticoagulation history: Eliquis   Past Medical History:  Diagnosis Date  . Irregular heart rate   . Thyroid disease    No past surgical history on file.  Current Outpatient Medications  Medication Sig Dispense Refill  . alendronate (FOSAMAX) 70 MG tablet     . amiodarone (PACERONE) 200 MG tablet Take 1 tablet (200 mg total) by mouth daily. 90 tablet 1  . atorvastatin (LIPITOR) 10 MG tablet Take 10 mg by mouth daily at 6 PM.    . calcium carbonate (OS-CAL - DOSED IN MG OF ELEMENTAL CALCIUM) 1250 (500 Ca) MG tablet Take 1 tablet by mouth daily with breakfast.    . diltiazem (CARDIZEM CD) 360 MG 24 hr capsule Take 1 capsule (360 mg total) by mouth daily. 90 capsule 3  . ELIQUIS 5 MG TABS tablet Take 5 mg by mouth in the morning and at bedtime.    . famotidine (PEPCID) 10 MG tablet Take 10 mg by mouth at bedtime.    . hydrochlorothiazide (HYDRODIURIL) 25 MG tablet Take 1 tablet (25 mg total) by mouth daily. 90 tablet 3  . levothyroxine (SYNTHROID, LEVOTHROID) 88 MCG tablet Take 88 mcg by mouth daily before breakfast.    . Multiple Vitamin (MULTIVITAMIN WITH MINERALS) TABS tablet Take 1 tablet by mouth daily.    . sodium chloride (OCEAN) 0.65 % SOLN nasal spray Place 1 spray into both nostrils as needed for congestion.     No current facility-administered medications for this encounter.    No Known Allergies  Social History   Socioeconomic History  . Marital status: Married    Spouse name: Not on file  .  Number of children: Not on file  . Years of education: Not on file  . Highest education level: Not on file  Occupational History  . Not on file  Tobacco Use  . Smoking status: Never Smoker  . Smokeless tobacco: Never Used  Substance and Sexual Activity  . Alcohol use: Yes    Alcohol/week: 1.0 standard drink    Types: 1 Glasses of wine per week  . Drug use: No  . Sexual activity: Not on  file  Other Topics Concern  . Not on file  Social History Narrative  . Not on file   Social Determinants of Health   Financial Resource Strain:   . Difficulty of Paying Living Expenses: Not on file  Food Insecurity:   . Worried About Charity fundraiser in the Last Year: Not on file  . Ran Out of Food in the Last Year: Not on file  Transportation Needs:   . Lack of Transportation (Medical): Not on file  . Lack of Transportation (Non-Medical): Not on file  Physical Activity:   . Days of Exercise per Week: Not on file  . Minutes of Exercise per Session: Not on file  Stress:   . Feeling of Stress : Not on file  Social Connections:   . Frequency of Communication with Friends and Family: Not on file  . Frequency of Social Gatherings with Friends and Family: Not on file  . Attends Religious Services: Not on file  . Active Member of Clubs or Organizations: Not on file  . Attends Archivist Meetings: Not on file  . Marital Status: Not on file  Intimate Partner Violence:   . Fear of Current or Ex-Partner: Not on file  . Emotionally Abused: Not on file  . Physically Abused: Not on file  . Sexually Abused: Not on file     ROS- All systems are reviewed and negative except as per the HPI above.  Physical Exam: Vitals:   09/21/19 1057  BP: (!) 146/64  Pulse: 62  Weight: 64.3 kg  Height: 5\' 4"  (1.626 m)    GEN- The patient is well appearing elderly female, alert and oriented x 3 today.   HEENT-head normocephalic, atraumatic, sclera clear, conjunctiva pink, hearing intact, trachea midline. Lungs- Clear to ausculation bilaterally, normal work of breathing Heart- Regular rate and rhythm, no murmurs, rubs or gallops  GI- soft, NT, ND, + BS Extremities- no clubbing, cyanosis, or edema MS- no significant deformity or atrophy Skin- no rash or lesion Psych- euthymic mood, full affect Neuro- strength and sensation are intact   Wt Readings from Last 3 Encounters:   09/21/19 64.3 kg  08/31/19 64.6 kg  08/11/19 65 kg    EKG today demonstrates SR HR 62, PR 168, QRS 86, QTc 564 (suspect U wave contributing)  Echo 05/27/19 demonstrated 1. Apical variant hypertrophic cardiomyopathy with no evidence of apical  thrombus. Left ventricular ejection fraction, by estimation, is 60 to 65%.  The left ventricle has normal function. The left ventricle demonstrates  regional wall motion  abnormalities (see scoring diagram/findings for description). There is  severe left ventricular hypertrophy of the apical segment. Left  ventricular diastolic parameters are consistent with Grade I diastolic  dysfunction (impaired relaxation).  2. Right ventricular systolic function is normal. The right ventricular  size is normal. There is moderately elevated pulmonary artery systolic  pressure. The estimated right ventricular systolic pressure is 32.3 mmHg.  3. Left atrial size was mildly dilated.  4. The  mitral valve is normal in structure. Mild mitral valve  regurgitation. No evidence of mitral stenosis.  5. Tricuspid valve regurgitation is mild to moderate.  6. Possible fusion of right and non coronary cusp. The aortic valve is  normal in structure. Aortic valve regurgitation is not visualized. Mild to  moderate aortic valve sclerosis/calcification is present, without any  evidence of aortic stenosis.  7. The inferior vena cava is normal in size with greater than 50%  respiratory variability, suggesting right atrial pressure of 3 mmHg.   Epic records are reviewed at length today  Labs from PCP office 05/05/19 TSH 0.59 Cr 0.90, K+ 4.0, AST 25, ALT 20 WBC 7.7, Hgb 14.2, HCT 42.0   CHA2DS2-VASc Score = 5  The patient's score is based upon: CHF History: 0 HTN History: 1 Age : 2 Diabetes History: 0 Stroke History: 0 Vascular Disease History: 1 Gender: 1      ASSESSMENT AND PLAN: 1. Persistent Atrial Fibrillation  The patient's CHA2DS2-VASc score is 5,  indicating a 7.2% annual risk of stroke. Patient converted to SR on medication.  Decrease amiodarone to 200 mg daily. Recheck TSH/Cmet today. Continue diltiazem 360 mg daily.  2. Secondary Hypercoagulable State (ICD10:  D68.69) The patient is at significant risk for stroke/thromboembolism based upon her CHA2DS2-VASc Score of 5.  Continue Apixaban (Eliquis).   3. HTN Stable, no changes today.   Follow up with Dr Gardiner Rhyme as scheduled. AF clinic in 4 months.    [facility] [address] [place], [place] [address] [phone] 09/21/2019 11:27 AM

## 2019-09-22 ENCOUNTER — Ambulatory Visit (HOSPITAL_COMMUNITY)
Admission: RE | Admit: 2019-09-22 | Discharge: 2019-09-22 | Disposition: A | Discharge status: Home / Self Care | Payer: Medicare HMO | Source: Ambulatory Visit | Attending: Physician Assistant | Admitting: Physician Assistant

## 2019-09-22 ENCOUNTER — Other Ambulatory Visit (HOSPITAL_COMMUNITY): Payer: Self-pay | Admitting: *Deleted

## 2019-09-22 DIAGNOSIS — I4819 Other persistent atrial fibrillation: Secondary | ICD-10-CM

## 2019-09-22 LAB — T4: T4, Total: 10.4 ug/dL (ref 4.5–12.0)

## 2019-10-27 ENCOUNTER — Other Ambulatory Visit: Payer: Self-pay

## 2019-11-03 ENCOUNTER — Other Ambulatory Visit: Payer: Self-pay

## 2019-11-03 MED ORDER — HYDROCHLOROTHIAZIDE 25 MG PO TABS: 25.0000 mg | ORAL_TABLET | Freq: Every day | ORAL | 3 refills | Status: DC

## 2019-11-03 NOTE — Telephone Encounter (Signed)
Rx request sent to pharmacy.  

## 2019-11-03 NOTE — Telephone Encounter (Signed)
This is Dr. Schumann's pt 

## 2019-11-08 NOTE — Progress Notes (Signed)
Cardiology Office Note:    Date:  11/12/2019   ID:  Paula [name], DOB Oct 10, 1935, MRN [id number]  PCP:  Lajean Manes, MD  Cardiologist:  No primary care provider on file.  Electrophysiologist:  None   Referring MD: Lajean Manes, MD   Chief Complaint  Patient presents with  . Cardiomyopathy    History of Present Illness:    Paula [name] is a 84 y.o. female with a hx of paroxysmal atrial fibrillation, hypertension, hypothyroidism who is referred by [name], PA for evaluation of hypertrophic cardiomyopathy.    Diagnosed with atrial fibrillation after presenting with palpitations to PCP office in April. EKG showed A. fib with RVR. Started on Eliquis given CHA2DS2-VASc score 5.  Also started on diltiazem for rate control. She was seen in AF clinic on 05/11/2019, she was noted to be in normal sinus rhythm. She was continued on Eliquis 5 mg twice daily and diltiazem 180 mg daily. TTE on 05/27/19 was concerning for apical variant hypertrophic cardiomyopathy, EF 60 to 65%, normal RV function, moderate elevation in RVSP (48 mmHg), mild mitral regurgitation, mild to moderate tricuspid regurgitation.  Cardiac MRI on 07/05/2019 showed LV apical hypertrophy measuring up to 15 mm, consistent with apical hypertrophic cardiomyopathy, small apical aneurysm, patchy apical LGE accounting for 1% of total myocardial mass, hyperdynamic LV systolic function, normal RV function, mild MR (regurgitant fraction 24%).  Zio patch x7 days on 07/14/2019 showed AF burden 47%, with episode lasting 3 days and average rate 118 bpm, no NSVT, frequent episodes of SVT, longest lasting 19 seconds.  Since last clinic visit, she reports that she has been doing well.  States that she has had no further A. fib since starting amiodarone.  Reports heart rate has been in 50s to 60s.  She denies any chest pain, dyspnea, lightheadedness, syncope.  Has been taking Eliquis, denies any bleeding issues.  Past Medical  History:  Diagnosis Date  . Irregular heart rate   . Thyroid disease     No past surgical history on file.  Current Medications: Current Meds  Medication Sig  . alendronate (FOSAMAX) 70 MG tablet   . amiodarone (PACERONE) 200 MG tablet Take 1 tablet (200 mg total) by mouth daily.  Marland Kitchen atorvastatin (LIPITOR) 10 MG tablet Take 10 mg by mouth daily at 6 PM.  . calcium carbonate (OS-CAL - DOSED IN MG OF ELEMENTAL CALCIUM) 1250 (500 Ca) MG tablet Take 1 tablet by mouth daily with breakfast.  . diltiazem (CARDIZEM CD) 360 MG 24 hr capsule Take 1 capsule (360 mg total) by mouth daily.  Marland Kitchen ELIQUIS 5 MG TABS tablet Take 5 mg by mouth in the morning and at bedtime.  . famotidine (PEPCID) 10 MG tablet Take 10 mg by mouth at bedtime.  Marland Kitchen levothyroxine (SYNTHROID, LEVOTHROID) 88 MCG tablet Take 88 mcg by mouth daily before breakfast.  . Multiple Vitamin (MULTIVITAMIN WITH MINERALS) TABS tablet Take 1 tablet by mouth daily.  . sodium chloride (OCEAN) 0.65 % SOLN nasal spray Place 1 spray into both nostrils as needed for congestion.  . [DISCONTINUED] hydrochlorothiazide (HYDRODIURIL) 25 MG tablet Take 1 tablet (25 mg total) by mouth daily.     Allergies:   Patient has no known allergies.   Social History   Socioeconomic History  . Marital status: Married    Spouse name: Not on file  . Number of children: Not on file  . Years of education: Not on file  . Highest education level: Not on  file  Occupational History  . Not on file  Tobacco Use  . Smoking status: Never Smoker  . Smokeless tobacco: Never Used  Substance and Sexual Activity  . Alcohol use: Yes    Alcohol/week: 1.0 standard drink    Types: 1 Glasses of wine per week  . Drug use: No  . Sexual activity: Not on file  Other Topics Concern  . Not on file  Social History Narrative  . Not on file   Social Determinants of Health   Financial Resource Strain:   . Difficulty of Paying Living Expenses: Not on file  Food Insecurity:    . Worried About Charity fundraiser in the Last Year: Not on file  . Ran Out of Food in the Last Year: Not on file  Transportation Needs:   . Lack of Transportation (Medical): Not on file  . Lack of Transportation (Non-Medical): Not on file  Physical Activity:   . Days of Exercise per Week: Not on file  . Minutes of Exercise per Session: Not on file  Stress:   . Feeling of Stress : Not on file  Social Connections:   . Frequency of Communication with Friends and Family: Not on file  . Frequency of Social Gatherings with Friends and Family: Not on file  . Attends Religious Services: Not on file  . Active Member of Clubs or Organizations: Not on file  . Attends Archivist Meetings: Not on file  . Marital Status: Not on file     Family History: The patient's family history includes Breast cancer (age of onset: 76) in her sister.  ROS:   Please see the history of present illness.     All other systems reviewed and are negative.  EKGs/Labs/Other Studies Reviewed:    The following studies were reviewed today:   EKG:  EKG is ordered today.  The ekg ordered today demonstrates sinus rhythm, rate 60, LVH with repolarization abnormalities  Recent Labs: 09/21/2019: ALT 25; BUN 10; Creatinine, Ser 1.10; Potassium 3.6; Sodium 133; TSH 4.624  Recent Lipid Panel No results found for: CHOL, TRIG, HDL, CHOLHDL, VLDL, LDLCALC, LDLDIRECT  Physical Exam:    VS:  BP 140/76   Pulse 60   Temp (!) 95 F (35 C)   Ht 5\' 4"  (1.626 m)   Wt 142 lb 12.8 oz (64.8 kg)   SpO2 97%   BMI 24.51 kg/m     Wt Readings from Last 3 Encounters:  11/12/19 142 lb 12.8 oz (64.8 kg)  09/21/19 141 lb 12.8 oz (64.3 kg)  08/31/19 142 lb 6.4 oz (64.6 kg)     GEN: in no acute distress HEENT: Normal NECK: No JVD; No carotid bruits CARDIAC: RRR, no murmurs, rubs, gallops RESPIRATORY:  Clear to auscultation without rales, wheezing or rhonchi  ABDOMEN: Soft, non-tender, non-distended MUSCULOSKELETAL:   No edema; No deformity  SKIN: Warm and dry NEUROLOGIC:  Alert and oriented x 3 PSYCHIATRIC:  Normal affect   ASSESSMENT:    1. Hypertrophic cardiomyopathy (HCC)   2. Paroxysmal atrial fibrillation ([place])   3. SVT (supraventricular tachycardia) ([place])   4. Essential hypertension    PLAN:    Hypertrophic cardiomyopathy: cardiac MRI on 07/05/2019 showed LV apical hypertrophy measuring up to 15 mm, consistent with apical hypertrophic cardiomyopathy, small apical aneurysm, patchy apical LGE accounting for 1% of total myocardial mass.  No NSVT on Zio patch x 7 days -Would recommend her son and sister be screened for HCM  Paroxysmal  atrial fibrillation: Diagnosed 04/2019 after presenting to PCPs office with palpitations. CHA2DS2-VASc score 5.  Zio patch x7 days on 07/14/2019 showed AF burden 47%, with episode lasting 3 days and average rate 118 bpm. -Continue Eliquis 5 mg twice daily -Continue diltiazem 360 mg daily -Follows with AF clinic, started on amiodarone 200 mg daily  SVT: Frequent episodes on recent ZIO monitoring, longest lasting 19 seconds.  Continue diltiazem  Hypertension: On hydrochlorothiazide 25 mg daily and diltiazem 360 mg daily.  Given her HCM, would favor avoiding diuretic to avoid dehydration causing cavitary obstruction.  Will switch from HCTZ to losartan 50 mg daily.  Check BMP in 1 week.  Asked patient call with results.  Hyperlipidemia: On atorvastatin 10 mg daily  RTC in 3 months  Medication Adjustments/Labs and Tests Ordered: Current medicines are reviewed at length with the patient today.  Concerns regarding medicines are outlined above.  Orders Placed This Encounter  Procedures  . Basic metabolic panel  . EKG 12-Lead   Meds ordered this encounter  Medications  . DISCONTD: losartan (COZAAR) 50 MG tablet    Sig: Take 1 tablet (50 mg total) by mouth daily.    Dispense:  90 tablet    Refill:  3    STOP HCTZ  . losartan (COZAAR) 50 MG tablet    Sig: Take 1  tablet (50 mg total) by mouth daily.    Dispense:  90 tablet    Refill:  0    STOP HCTZ    Patient Instructions  Medication Instructions:  STOP hydrochlorothiazide (HCTZ) START Losartan 50 mg daily  *If you need a refill on your cardiac medications before your next appointment, please call your pharmacy*   Lab Work: Please return for labs in 1 week (BMET)  Our in office lab hours are Monday-Friday 8:00-4:00, closed for lunch 12:45-1:45 pm.  No appointment needed.  Follow-Up: At [facility], you and your health needs are our priority.  As part of our continuing mission to provide you with exceptional heart care, we have created designated Provider Care Teams.  These Care Teams include your primary Cardiologist (physician) and Advanced Practice Providers (APPs -  Physician Assistants and Nurse Practitioners) who all work together to provide you with the care you need, when you need it.  We recommend signing up for the patient portal called "MyChart".  Sign up information is provided on this After Visit Summary.  MyChart is used to connect with patients for Virtual Visits (Telemedicine).  Patients are able to view lab/test results, encounter notes, upcoming appointments, etc.  Non-urgent messages can be sent to your provider as well.   To learn more about what you can do with MyChart, go to [email or web address].    Your next appointment:   3 month(s)  The format for your next appointment:   In Person  Provider:   Oswaldo Milian, MD   Other Instructions Please check your blood pressure at home daily, write it down.  Call the office or send message via Mychart with the readings in 2 weeks for Dr. Gardiner Rhyme to review.       Signed, Donato Heinz, MD  11/12/2019 5:41 PM    [place]

## 2019-11-12 ENCOUNTER — Other Ambulatory Visit: Payer: Self-pay

## 2019-11-12 ENCOUNTER — Ambulatory Visit: Payer: Medicare HMO | Admitting: Cardiology

## 2019-11-12 ENCOUNTER — Encounter: Payer: Self-pay | Admitting: Cardiology

## 2019-11-12 VITALS — BP 140/76 | HR 60 | Temp 95.0°F | Ht 64.0 in | Wt 142.8 lb

## 2019-11-12 DIAGNOSIS — I471 Supraventricular tachycardia, unspecified: Secondary | ICD-10-CM

## 2019-11-12 DIAGNOSIS — I422 Other hypertrophic cardiomyopathy: Secondary | ICD-10-CM

## 2019-11-12 DIAGNOSIS — I10 Essential (primary) hypertension: Secondary | ICD-10-CM | POA: Diagnosis not present

## 2019-11-12 DIAGNOSIS — I480 Paroxysmal atrial fibrillation: Secondary | ICD-10-CM

## 2019-11-12 MED ORDER — LOSARTAN POTASSIUM 50 MG PO TABS: 50.0000 mg | ORAL_TABLET | Freq: Every day | ORAL | 3 refills | Status: DC

## 2019-11-12 MED ORDER — LOSARTAN POTASSIUM 50 MG PO TABS: 50.0000 mg | ORAL_TABLET | Freq: Every day | ORAL | 0 refills | Status: DC

## 2019-11-12 NOTE — Patient Instructions (Signed)
Medication Instructions:  STOP hydrochlorothiazide (HCTZ) START Losartan 50 mg daily  *If you need a refill on your cardiac medications before your next appointment, please call your pharmacy*   Lab Work: Please return for labs in 1 week (BMET)  Our in office lab hours are Monday-Friday 8:00-4:00, closed for lunch 12:45-1:45 pm.  No appointment needed.  Follow-Up: At [facility], you and your health needs are our priority.  As part of our continuing mission to provide you with exceptional heart care, we have created designated Provider Care Teams.  These Care Teams include your primary Cardiologist (physician) and Advanced Practice Providers (APPs -  Physician Assistants and Nurse Practitioners) who all work together to provide you with the care you need, when you need it.  We recommend signing up for the patient portal called "MyChart".  Sign up information is provided on this After Visit Summary.  MyChart is used to connect with patients for Virtual Visits (Telemedicine).  Patients are able to view lab/test results, encounter notes, upcoming appointments, etc.  Non-urgent messages can be sent to your provider as well.   To learn more about what you can do with MyChart, go to [email or web address].    Your next appointment:   3 month(s)  The format for your next appointment:   In Person  Provider:   Oswaldo Milian, MD   Other Instructions Please check your blood pressure at home daily, write it down.  Call the office or send message via Mychart with the readings in 2 weeks for Dr. Gardiner Rhyme to review.

## 2019-11-19 DIAGNOSIS — I10 Essential (primary) hypertension: Secondary | ICD-10-CM | POA: Diagnosis not present

## 2019-11-19 DIAGNOSIS — I422 Other hypertrophic cardiomyopathy: Secondary | ICD-10-CM | POA: Diagnosis not present

## 2019-11-19 DIAGNOSIS — I471 Supraventricular tachycardia: Secondary | ICD-10-CM | POA: Diagnosis not present

## 2019-11-19 DIAGNOSIS — I480 Paroxysmal atrial fibrillation: Secondary | ICD-10-CM | POA: Diagnosis not present

## 2019-11-19 LAB — BASIC METABOLIC PANEL
BUN/Creatinine Ratio: 10 — ABNORMAL LOW (ref 12–28)
BUN: 11 mg/dL (ref 8–27)
CO2: 25 mmol/L (ref 20–29)
Calcium: 9.6 mg/dL (ref 8.7–10.3)
Chloride: 94 mmol/L — ABNORMAL LOW (ref 96–106)
Creatinine, Ser: 1.08 mg/dL — ABNORMAL HIGH (ref 0.57–1.00)
GFR calc Af Amer: 55 mL/min/{1.73_m2} — ABNORMAL LOW (ref >59)
GFR calc non Af Amer: 48 mL/min/{1.73_m2} — ABNORMAL LOW (ref >59)
Glucose: 91 mg/dL (ref 65–99)
Potassium: 4.7 mmol/L (ref 3.5–5.2)
Sodium: 130 mmol/L — ABNORMAL LOW (ref 134–144)

## 2019-11-29 ENCOUNTER — Telehealth: Payer: Self-pay | Admitting: Cardiology

## 2019-11-29 NOTE — Telephone Encounter (Signed)
Patient calling with BP readings:  11/15/2019 am 114/57 pm 180/78 11/16/2019 am 130/65 pm 120/66 11/17/2019 am 109/53 pm 145/69 11/18/2019 am 139/64  pm 134/64 11/19/2019 am 121/60 pm 147/65 11/20/2019 am 97/49  pm 129/62 11/21/2019 am 124/63 pm 138/73 11/22/2019 am 122/55 pm 128/62 11/23/2019 am 124/53 pm 127/70 11/24/2019 am 114/59 pm 143/70 11/25/2019 am 127/57 pm 174/77 11/26/2019 am 122/62 pm 123/62 11/27/2019 am 120/59 pm 134/64 11/28/2019 am 182/82 pm 131/66

## 2019-11-29 NOTE — Telephone Encounter (Signed)
Pt submitted her blood pressure readings, which note a few isolated hypertensive episodes. Pt describes being unsure of what brings these episodes on. States sometimes they have occurred when she has been sleeping. Denies chest pain or shortness of breath. States she can sense a pounding in her ears when her BP is elevated. Records her heart rates along with her BPs and reports that HR stays in 60s-70s. She denies feeling as though she's in afib at any point since seeing the Afib Clinic. Pt states she is feeling well this morning. Pt states she is compliant with her medications. Will forward to MD for review.

## 2019-12-03 ENCOUNTER — Other Ambulatory Visit: Payer: Self-pay | Admitting: Physician Assistant

## 2019-12-03 DIAGNOSIS — Z79899 Other long term (current) drug therapy: Secondary | ICD-10-CM

## 2019-12-03 MED ORDER — LOSARTAN POTASSIUM 100 MG PO TABS: 100.0000 mg | ORAL_TABLET | Freq: Every day | ORAL | 1 refills | Status: DC

## 2019-12-03 NOTE — Telephone Encounter (Signed)
Recommend increase losartan to 100 mg daily, check BMET in 1 week

## 2019-12-03 NOTE — Telephone Encounter (Signed)
Pt c/o BP issue: STAT if pt c/o blurred vision, one-sided weakness or slurred speech  1. What are your last 5 BP readings?  180/82 Pulse 82  2. Are you having any other symptoms (ex. Dizziness, headache, blurred vision, passed out)? No .  Slightly light headed   3. What is your BP issue? Pt stated she had another episode last night .  She heard her heart beat in ears .  She stated it was about 11:40.  He woke her up.  She woke up and got some water and it lasted about 2 hours.  She stated she took it this morning about 6am 150/71.    She would like see if the nurse could call her   Best number [phone]

## 2019-12-03 NOTE — Telephone Encounter (Signed)
Pt is calling back to report another incident of elevated BP late at night after she has gone to bed. Last night around 11:30 pt was awoken feeling as though she had pounding in her head that she could hear. BP at that time was 180/80. This persisted for about 2 hours and then came back down. After taking her morning medcations at around 6 a.m., pt says her BP has come down to 150/71 and the auditory pounding sensation has abated. Pt states these episodes have happened 3 times since her medications were adjusted on 10/24. It has happened late in the evenings with the pounding sounds each time. Pt monitors her BP during the daytime and is usually well controlled. Will seek provider recommendations.

## 2019-12-03 NOTE — Telephone Encounter (Signed)
I have spoken with Paula [name] and relayed Dr. Newman Nickels recommendation. New rx sent to [facility] mail in pharmacy. She will need a BMET lab either next Thurs or Friday. Will place order.

## 2019-12-07 ENCOUNTER — Telehealth: Payer: Self-pay | Admitting: Cardiology

## 2019-12-07 MED ORDER — LOSARTAN POTASSIUM 100 MG PO TABS: 50.0000 mg | ORAL_TABLET | Freq: Two times a day (BID) | ORAL | 1 refills | Status: DC

## 2019-12-07 NOTE — Telephone Encounter (Signed)
Spoke to patient, she states she takes her PM medication around 6pm.   BP around 430pm was 174/83.  She states she has been going to bed and wakes up around 11-11:30 with a flushed feeling in her face and "pounding" in her ears.  This is worse when she lies down.  She has been sitting up all night due to this.      Spoke with Dr. Gardiner Rhyme, patient will recheck BP in ~1 hour and if >673 systolic will take 50 mg losartan.   Starting tomorrow she will take losartan 50 mg BID and continue to monitor BP.   Aware to call on call provider overnight if needed, if symptoms return and/or worsen to proceed to ER to be evaluated.

## 2019-12-07 NOTE — Telephone Encounter (Signed)
Let's try splitting up losartan into 50 mg BID so takes a dose in the evening, and can see if that helps with these evening episodes

## 2019-12-07 NOTE — Telephone Encounter (Signed)
Patient called and stated that she needs to talk with Dr. Gardiner Rhyme or nurse about last night. BP was 191/82; 87. Seems like each time BP goes up, it is around night time and take even meds at 6:30. At 9:00 this morning, it was 147/66; 67. Please call

## 2019-12-07 NOTE — Addendum Note (Signed)
Addended by: Patria Mane A on: 12/07/2019 06:33 PM   Modules accepted: Orders

## 2019-12-07 NOTE — Telephone Encounter (Signed)
Returned call to patient. Medications changed on 11/14/19 since then, she has had 4 "attacks" in which she goes to bed and suffers pounding in her ears. She gets up and takes her BP. D\uring these attacks, her BP is elevated.  Last night, BP was 191/82 HR 87. She is concerned that BP is elevated at night. She reports headache but thinks this is related to sitting in her recliner. She did not wake up with a headache.  This morning BP was 147/68 after her morning medications. She only takes 360 mg diltiazem in the evening.   She takes amiodarone and 100 mg losartan in the morning.   I will send a message to Dr. Gardiner Rhyme.

## 2019-12-08 NOTE — Telephone Encounter (Signed)
Spoke to patient-she states BP last night on recheck was 165/70, did not take additional losartan.   Was able to rest last night and did not have to sit up or have pounding in her ears    BP this AM 128/63  215 pm 109/53   She is coming in for labs tomorrow and agreed to schedule appt with pharmD at that time.  She will continue to monitor and notify us with any significant change

## 2019-12-08 NOTE — Telephone Encounter (Signed)
Would also recommend we schedule in pharmacy HTN clinic in next few weeks

## 2019-12-09 ENCOUNTER — Other Ambulatory Visit: Payer: Self-pay

## 2019-12-09 DIAGNOSIS — Z79899 Other long term (current) drug therapy: Secondary | ICD-10-CM

## 2019-12-09 LAB — BASIC METABOLIC PANEL
BUN/Creatinine Ratio: 8 — ABNORMAL LOW (ref 12–28)
BUN: 9 mg/dL (ref 8–27)
CO2: 23 mmol/L (ref 20–29)
Calcium: 9.6 mg/dL (ref 8.7–10.3)
Chloride: 96 mmol/L (ref 96–106)
Creatinine, Ser: 1.12 mg/dL — ABNORMAL HIGH (ref 0.57–1.00)
GFR calc Af Amer: 53 mL/min/{1.73_m2} — ABNORMAL LOW (ref >59)
GFR calc non Af Amer: 46 mL/min/{1.73_m2} — ABNORMAL LOW (ref >59)
Glucose: 90 mg/dL (ref 65–99)
Potassium: 4.7 mmol/L (ref 3.5–5.2)
Sodium: 133 mmol/L — ABNORMAL LOW (ref 134–144)

## 2019-12-13 NOTE — Telephone Encounter (Signed)
Spoke with patient. Patient calling to follow up. Patient reports her medications were changed on 11/17 by Dr. Gardiner Rhyme and she's calling to report there have been no changes in her higher BP readings at night.   On 11/19: 180/77 11/20: 192/82 11/21: 151/75 and 161/77  She had to sleep somewhat upright due to a pulsating in her ears. She reports no headache but flushing of the face and head when this occurs.   Blood pressure right now is 131/60, HR 68. She has a Pharm D appointment on 12/23/19 at [facility]. She is taking 50mg  of Losartan BID. Will route to MD and primary nurse [facility] for review.

## 2019-12-13 NOTE — Telephone Encounter (Signed)
Patient states this weekend her BP started going up again this weekend. She states Saturday morning it went up to 191. She states the top number was ranging 154 , 157, 147, 148, 151.

## 2019-12-14 NOTE — Telephone Encounter (Signed)
Recommend switch from losartan to valsartan 160 mg twice daily.  Check BMET in 1 week.  Check BP twice daily and bring log and home BP monitor to hypertension clinic appointment

## 2019-12-14 NOTE — Telephone Encounter (Signed)
Spoke to patient, opening tomorrow with pharmD at [facility]-appt scheduled (ok with Dr. Gardiner Rhyme) to come in to discuss.    Patient will bring BP cuff, medications and BP log.

## 2019-12-15 ENCOUNTER — Ambulatory Visit (INDEPENDENT_AMBULATORY_CARE_PROVIDER_SITE_OTHER): Payer: Medicare HMO | Admitting: Pharmacist Clinician (PhC)/ Clinical Pharmacy Specialist

## 2019-12-15 ENCOUNTER — Other Ambulatory Visit: Payer: Self-pay

## 2019-12-15 DIAGNOSIS — I10 Essential (primary) hypertension: Secondary | ICD-10-CM | POA: Insufficient documentation

## 2019-12-15 MED ORDER — PRAZOSIN HCL 1 MG PO CAPS: 1.0000 mg | ORAL_CAPSULE | Freq: Every day | ORAL | 1 refills | Status: DC

## 2019-12-15 NOTE — Telephone Encounter (Addendum)
Spoke to patient yesterday evening and no med changes were made.    Scheduled to come in today to discuss (ok per Dr. Gardiner Rhyme) before making any additional medication changes. pharmD aware

## 2019-12-15 NOTE — Patient Instructions (Signed)
Call in 2 weeks to let us know how you are doing.  Paula [name]/Raquel at [phone]  Check your blood pressure at home daily and keep record of the readings.  Take your BP meds as follows:  Start prazosin 1 mg once daily at bedtime  Continue with all other medications  Bring all of your meds, your BP cuff and your record of home blood pressures to your next appointment.  Exercise as youre able, try to walk approximately 30 minutes per day.  Keep salt intake to a minimum, especially watch canned and prepared boxed foods.  Eat more fresh fruits and vegetables and fewer canned items.  Avoid eating in fast food restaurants.    HOW TO TAKE YOUR BLOOD PRESSURE:  Rest 5 minutes before taking your blood pressure.   Dont smoke or drink caffeinated beverages for at least 30 minutes before.  Take your blood pressure before (not after) you eat.  Sit comfortably with your back supported and both feet on the floor (dont cross your legs).  Elevate your arm to heart level on a table or a desk.  Use the proper sized cuff. It should fit smoothly and snugly around your bare upper arm. There should be enough room to slip a fingertip under the cuff. The bottom edge of the cuff should be 1 inch above the crease of the elbow.  Ideally, take 3 measurements at one sitting and record the average.

## 2019-12-15 NOTE — Telephone Encounter (Signed)
Will recommend to follow Dr Gardiner Rhyme instructions and schedule follow up with HTN clinic in 1-2 weeks after initing new medication, unless patient wants to see the pharmacist before making any additional medication changes.

## 2019-12-15 NOTE — Progress Notes (Signed)
12/15/2019 [name] 1935/10/23 [id number]   HPI:  Paula [name] is a 84 y.o. female patient of Dr Gardiner Rhyme, with a PMH below who presents today for hypertension clinic evaluation.  She was last seen by Dr. Gardiner Rhyme about a month ago, at which time her pressure was good at 140/76.  At that time she was taking hydrochlorothiazide 25 and diltiazem 360.  Because of the hypertrophic cardiomyopathy he switched her off the diuretic and to losartan 50 mg once daily.  This was then increased to 100 mg after 2 weeks when she first reported higher evening numbers.  Patient then called another week later to report having 4 "attacks" - waking up in the night because of pounding in her ears.  It was recommended that she divide her losartan to 50 mg twice daily.  This has not seemed to resolve the issue, so she was scheduled to see CVRR today.   She is here today with her husband.  They report that she has had 5 or 6 "episodes" in the past several weeks.  They all are very similar in nature.  She goes to bed around 10:30 and falls asleep fairly easily.  Then will wake up about 30-90 minutes later with the following symptoms: facial flushing, pounding sensation in her ears, feeling her heart pound in her chest and tremors.  She will get up from bed, drink some water and put on soothing music.  Within 2-3 hours her pressure is back down and she is feeling better, however she stays  awake most of the night, worried that it will happen again.   Then she will sleep well for 2-3 nights before it happens again.    Past Medical History: Atrial fibrillation CHADS2-VASc score  - on Eliquis; patient cannot tell when she is in AF  Hypertrophic cardiomyopathy   hypothyroidism Last TSH slightly elevated at 4.624; currently on levothyroxine 88 mcg     Blood Pressure Goal:  130/80  Current Medications: losartan 50 mg bid, diltiazem 360 mg qd  Social Hx: no tobacco, rare alcohol, drinks 1 cup of  half-caf coffee most days  Diet: does eat takeout regularly, restaurant up the street is a good place to get steamed vegetables  Exercise: activities of daily living  Home BP readings: am readings range 970-263 systolic, evenings 785-885 systolic; diastolic all WNL.  Episodes in question all happened around 11 pm - midnight with range 175-190.  Intolerances: nkda  Labs: 8/21: Na 133, K 3.6, Glu 93, BUN 10, SCr 1.10 (on hctz 25 mg)  Wt Readings from Last 3 Encounters:  12/15/19 142 lb (64.4 kg)  11/12/19 142 lb 12.8 oz (64.8 kg)  09/21/19 141 lb 12.8 oz (64.3 kg)   BP Readings from Last 3 Encounters:  12/15/19 (!) 152/84  11/12/19 140/76  09/21/19 (!) 146/64   Pulse Readings from Last 3 Encounters:  12/15/19 72  11/12/19 60  09/21/19 62    Current Outpatient Medications  Medication Sig Dispense Refill   alendronate (FOSAMAX) 70 MG tablet      amiodarone (PACERONE) 200 MG tablet Take 1 tablet (200 mg total) by mouth daily. 90 tablet 1   atorvastatin (LIPITOR) 10 MG tablet Take 10 mg by mouth daily at 6 PM.     calcium carbonate (OS-CAL - DOSED IN MG OF ELEMENTAL CALCIUM) 1250 (500 Ca) MG tablet Take 1 tablet by mouth daily with breakfast.     diltiazem (CARDIZEM CD) 360 MG 24 hr  capsule Take 1 capsule (360 mg total) by mouth daily. 90 capsule 3   ELIQUIS 5 MG TABS tablet Take 5 mg by mouth in the morning and at bedtime.     famotidine (PEPCID) 10 MG tablet Take 10 mg by mouth at bedtime.     levothyroxine (SYNTHROID, LEVOTHROID) 88 MCG tablet Take 88 mcg by mouth daily before breakfast.     losartan (COZAAR) 100 MG tablet Take 0.5 tablets (50 mg total) by mouth 2 (two) times daily. 90 tablet 1   Multiple Vitamin (MULTIVITAMIN WITH MINERALS) TABS tablet Take 1 tablet by mouth daily.     sodium chloride (OCEAN) 0.65 % SOLN nasal spray Place 1 spray into both nostrils as needed for congestion.     prazosin (MINIPRESS) 1 MG capsule Take 1 capsule (1 mg total) by mouth  at bedtime. 30 capsule 1   No current facility-administered medications for this visit.    No Known Allergies  Past Medical History:  Diagnosis Date   Irregular heart rate    Thyroid disease     Blood pressure (!) 152/84, pulse 72, resp. rate 15, height 5\' 4"  (1.626 m), weight 142 lb (64.4 kg), SpO2 98 %.  Hypertension Patient with essential hypertension, having problems with isolated spikes in her pressure.  These are all mostly identical in nature and happen only every few nights at the most.  We reviewed her diagnosis of atrial fibrillation, but as her heart rate is not increasing greatly (it goes up 10-20 bpm) and the events are taking place at roughly the same time at night, I doubt this would be related in any way.  She did mention that she will have some vivid dreams on these nights, but can never recall what they are about when she wakes.   For now I will have her continue with the losartan twice daily and add prazosin 1 mg each night at bedtime.  I explained that she may be having some type of night terror or anxiety, and if that is the case, she would be best to see her PCP.  She is going to call back in 2 weeks with some home readings and we can then assess further.  I did stress to patient and husband that if her pressure goes to > 200 or she seems more out of focus (or any other questionable symptoms) to call 911.     Tommy Medal PharmD CPP [facility] [address] [place], [place] [address] [phone]

## 2019-12-15 NOTE — Assessment & Plan Note (Addendum)
Patient with essential hypertension, having problems with isolated spikes in her pressure.  These are all mostly identical in nature and happen only every few nights at the most.  We reviewed her diagnosis of atrial fibrillation, but as her heart rate is not increasing greatly (it goes up 10-20 bpm) and the events are taking place at roughly the same time at night, I doubt this would be related in any way.  She did mention that she will have some vivid dreams on these nights, but can never recall what they are about when she wakes.   For now I will have her continue with the losartan twice daily and add prazosin 1 mg each night at bedtime.  I explained that she may be having some type of night terror or anxiety, and if that is the case, she would be best to see her PCP.  She is going to call back in 2 weeks with some home readings and we can then assess further.  I did stress to patient and husband that if her pressure goes to > 200 or she seems more out of focus (or any other questionable symptoms) to call 911.

## 2019-12-20 ENCOUNTER — Telehealth: Payer: Self-pay | Admitting: Cardiology

## 2019-12-20 MED ORDER — LOSARTAN POTASSIUM 50 MG PO TABS
50.0000 mg | ORAL_TABLET | Freq: Two times a day (BID) | ORAL | 2 refills | Status: DC
Start: 2019-12-20 — End: 2019-12-28

## 2019-12-20 NOTE — Telephone Encounter (Signed)
Spoke with patient who states that she attempted to cut her 100mg  Losartan tablet in half to take her prescribed 50mg  twice daily but it broke. Patient spoke with pharmacy who stated that patient would need new refill with 50mg  tablets so she wont have to split the pill in half.  Advised patient that a refill for Losartan 50mg  (1 tablet) twice daily has been sent in to [facility].   Patient verbalized understanding of all instructions.

## 2019-12-20 NOTE — Telephone Encounter (Signed)
    Pt c/o medication issue:  1. Name of Medication: losartan (COZAAR) 100 MG tablet  2. How are you currently taking this medication (dosage and times per day)? Take 0.5 tablets (50 mg total) by mouth 2 (two) times daily.  3. Are you having a reaction (difficulty breathing--STAT)?   4. What is your medication issue? Pt said, she was told by her pharmacy not to cut this medication because there's no line and not easy to cut. They recommend Dr. Gardiner Rhyme send a new prescription for 50 mg twice a day. To send it to [place], [place]

## 2019-12-23 ENCOUNTER — Ambulatory Visit: Payer: Medicare HMO

## 2019-12-28 ENCOUNTER — Other Ambulatory Visit: Payer: Self-pay

## 2019-12-28 MED ORDER — LOSARTAN POTASSIUM 50 MG PO TABS
50.0000 mg | ORAL_TABLET | Freq: Two times a day (BID) | ORAL | 3 refills | Status: DC
Start: 2019-12-28 — End: 2020-01-27

## 2019-12-30 ENCOUNTER — Other Ambulatory Visit: Payer: Self-pay | Admitting: Geriatric Medicine

## 2019-12-30 DIAGNOSIS — Z1231 Encounter for screening mammogram for malignant neoplasm of breast: Secondary | ICD-10-CM

## 2019-12-30 MED ORDER — PRAZOSIN HCL 1 MG PO CAPS
1.0000 mg | ORAL_CAPSULE | Freq: Every day | ORAL | 1 refills | Status: DC
Start: 2019-12-30 — End: 2020-02-24

## 2019-12-30 MED ORDER — HYDROCHLOROTHIAZIDE 25 MG PO TABS: 25.0000 mg | ORAL_TABLET | Freq: Every day | ORAL | 3 refills | Status: DC

## 2019-12-31 DIAGNOSIS — Z01419 Encounter for gynecological examination (general) (routine) without abnormal findings: Secondary | ICD-10-CM | POA: Diagnosis not present

## 2019-12-31 DIAGNOSIS — L900 Lichen sclerosus et atrophicus: Secondary | ICD-10-CM | POA: Diagnosis not present

## 2020-01-05 ENCOUNTER — Ambulatory Visit
Admission: RE | Admit: 2020-01-05 | Discharge: 2020-01-05 | Disposition: A | Discharge status: Home / Self Care | Payer: Medicare HMO | Source: Ambulatory Visit | Attending: Geriatric Medicine | Admitting: Geriatric Medicine

## 2020-01-05 ENCOUNTER — Other Ambulatory Visit: Payer: Self-pay

## 2020-01-05 DIAGNOSIS — Z1231 Encounter for screening mammogram for malignant neoplasm of breast: Secondary | ICD-10-CM

## 2020-01-19 NOTE — Progress Notes (Signed)
Primary Care Physician: Merlene Laughter, MD Primary Cardiologist: Dr Bjorn Pippin Primary Electrophysiologist: none Referring Physician: Dr Caryl Bis Paula [name] is a 84 y.o. female with a history of HTN, hypothyroidism, aortic atherosclerosis, and paroxysmal atrial fibrillation who presents for follow up in the [facility].  The patient was initially diagnosed with atrial fibrillation 05/05/19 after presenting with symptoms of palpitations to her PCP office. ECG showed afib with RVR. Patient was started on Eliquis for a CHADS2VASC score of 5 and diltiazem for rate control. Patient has reported palpitations in the past and has worn cardiac monitors but this was the first captured afib episode. Echocardiogram showed EF 60-65% with LV hypertrophy of apical segment. MRI 07/05/19 confirmed HCM.  On follow up today, patient reports she has done very well since her last visit. She denies any heart racing or palpitations. She denies any bleeding issues on anticoagulation. Her blood pressure has been well controlled with the recent medication adjustments.   Today, she denies symptoms of palpitations, chest pain, shortness of breath, orthopnea, PND, lower extremity edema, dizziness, presyncope, syncope, snoring, daytime somnolence, bleeding, or neurologic sequela. The patient is tolerating medications without difficulties and is otherwise without complaint today.    Atrial Fibrillation Risk Factors:  she does not have symptoms or diagnosis of sleep apnea. she does not have a history of rheumatic fever. she does not have a history of alcohol use. The patient does not have a history of early familial atrial fibrillation or other arrhythmias.  she has a BMI of Body mass index is 24.41 kg/m.Marland Kitchen Filed Weights   01/20/20 1103  Weight: 64.5 kg    Family History  Problem Relation Age of Onset   Breast cancer Sister 35     Atrial Fibrillation Management  history:  Previous antiarrhythmic drugs: amiodarone Previous cardioversions: none Previous ablations: none CHADS2VASC score: 5 Anticoagulation history: Eliquis   Past Medical History:  Diagnosis Date   Irregular heart rate    Thyroid disease    No past surgical history on file.  Current Outpatient Medications  Medication Sig Dispense Refill   alendronate (FOSAMAX) 70 MG tablet      amiodarone (PACERONE) 200 MG tablet Take 1 tablet (200 mg total) by mouth daily. 90 tablet 1   atorvastatin (LIPITOR) 10 MG tablet Take 10 mg by mouth daily at 6 PM.     calcium carbonate (OS-CAL - DOSED IN MG OF ELEMENTAL CALCIUM) 1250 (500 Ca) MG tablet Take 1 tablet by mouth daily with breakfast.     diltiazem (CARDIZEM CD) 360 MG 24 hr capsule Take 1 capsule (360 mg total) by mouth daily. 90 capsule 3   ELIQUIS 5 MG TABS tablet Take 5 mg by mouth in the morning and at bedtime.     famotidine (PEPCID) 10 MG tablet Take 10 mg by mouth at bedtime.     hydrochlorothiazide (HYDRODIURIL) 25 MG tablet Take 1 tablet (25 mg total) by mouth daily. 90 tablet 3   levothyroxine (SYNTHROID, LEVOTHROID) 88 MCG tablet Take 88 mcg by mouth daily before breakfast.     losartan (COZAAR) 50 MG tablet Take 1 tablet (50 mg total) by mouth 2 (two) times daily. 90 tablet 3   Multiple Vitamin (MULTIVITAMIN WITH MINERALS) TABS tablet Take 1 tablet by mouth daily.     prazosin (MINIPRESS) 1 MG capsule Take 1 capsule (1 mg total) by mouth at bedtime. 90 capsule 1   sodium chloride (OCEAN) 0.65 % SOLN nasal  spray Place 1 spray into both nostrils as needed for congestion.     No current facility-administered medications for this encounter.    Allergies  Allergen Reactions   Wound Dressing Adhesive     Other reaction(s): rash    Social History   Socioeconomic History   Marital status: Married    Spouse name: Not on file   Number of children: Not on file   Years of education: Not on file   Highest  education level: Not on file  Occupational History   Not on file  Tobacco Use   Smoking status: Never Smoker   Smokeless tobacco: Never Used  Substance and Sexual Activity   Alcohol use: Yes    Alcohol/week: 1.0 standard drink    Types: 1 Glasses of wine per week   Drug use: No   Sexual activity: Not on file  Other Topics Concern   Not on file  Social History Narrative   Not on file   Social Determinants of Health   Financial Resource Strain: Not on file  Food Insecurity: Not on file  Transportation Needs: Not on file  Physical Activity: Not on file  Stress: Not on file  Social Connections: Not on file  Intimate Partner Violence: Not on file     ROS- All systems are reviewed and negative except as per the HPI above.  Physical Exam: Vitals:   01/20/20 1103  BP: 138/62  Pulse: 67  Weight: 64.5 kg  Height: 5\' 4"  (1.626 m)    GEN- The patient is well appearing elderly female, alert and oriented x 3 today.   HEENT-head normocephalic, atraumatic, sclera clear, conjunctiva pink, hearing intact, trachea midline. Lungs- Clear to ausculation bilaterally, normal work of breathing Heart- Regular rate and rhythm, no murmurs, rubs or gallops  GI- soft, NT, ND, + BS Extremities- no clubbing, cyanosis, or edema MS- no significant deformity or atrophy Skin- no rash or lesion Psych- euthymic mood, full affect Neuro- strength and sensation are intact   Wt Readings from Last 3 Encounters:  01/20/20 64.5 kg  12/15/19 64.4 kg  11/12/19 64.8 kg    EKG today demonstrates  SR, NST Vent. rate 67 BPM PR interval 176 ms QRS duration 92 ms QT/QTc 318/336 ms  Echo 05/27/19 demonstrated 1. Apical variant hypertrophic cardiomyopathy with no evidence of apical  thrombus. Left ventricular ejection fraction, by estimation, is 60 to 65%.  The left ventricle has normal function. The left ventricle demonstrates  regional wall motion  abnormalities (see scoring diagram/findings  for description). There is  severe left ventricular hypertrophy of the apical segment. Left  ventricular diastolic parameters are consistent with Grade I diastolic  dysfunction (impaired relaxation).  2. Right ventricular systolic function is normal. The right ventricular  size is normal. There is moderately elevated pulmonary artery systolic  pressure. The estimated right ventricular systolic pressure is [id number] mmHg.  3. Left atrial size was mildly dilated.  4. The mitral valve is normal in structure. Mild mitral valve  regurgitation. No evidence of mitral stenosis.  5. Tricuspid valve regurgitation is mild to moderate.  6. Possible fusion of right and non coronary cusp. The aortic valve is  normal in structure. Aortic valve regurgitation is not visualized. Mild to  moderate aortic valve sclerosis/calcification is present, without any  evidence of aortic stenosis.  7. The inferior vena cava is normal in size with greater than 50%  respiratory variability, suggesting right atrial pressure of 3 mmHg.   Epic records are  reviewed at length today  Labs from PCP office 05/05/19 TSH 0.59 Cr 0.90, K+ 4.0, AST 25, ALT 20 WBC 7.7, Hgb 14.2, HCT 42.0   CHA2DS2-VASc Score = 5  The patient's score is based upon: CHF History: No HTN History: Yes Diabetes History: No Stroke History: No Vascular Disease History: Yes      ASSESSMENT AND PLAN: 1. Persistent Atrial Fibrillation  The patient's CHA2DS2-VASc score is 5, indicating a 7.2% annual risk of stroke. Patient appears to be maintaining SR. Continue amiodarone 200 mg daily. Continue diltiazem 360 mg daily.  2. Secondary Hypercoagulable State (ICD10:  D68.69) The patient is at significant risk for stroke/thromboembolism based upon her CHA2DS2-VASc Score of 5.  Continue Apixaban (Eliquis).   3. HTN Followed in HTN Clinic.   Follow up with Dr Bjorn Pippin as scheduled. AF clinic in 6 months.    Jorja Loa PA-C Afib Clinic [facility] [address] [place], [place] [address] [phone] 01/20/2020 11:11 AM

## 2020-01-20 ENCOUNTER — Other Ambulatory Visit: Payer: Self-pay

## 2020-01-20 ENCOUNTER — Other Ambulatory Visit: Payer: Self-pay | Admitting: *Deleted

## 2020-01-20 ENCOUNTER — Ambulatory Visit (HOSPITAL_COMMUNITY)
Admission: RE | Admit: 2020-01-20 | Discharge: 2020-01-20 | Disposition: A | Discharge status: Home / Self Care | Payer: Medicare HMO | Source: Ambulatory Visit | Attending: Physician Assistant | Admitting: Physician Assistant

## 2020-01-20 ENCOUNTER — Encounter (HOSPITAL_COMMUNITY): Payer: Self-pay | Admitting: Physician Assistant

## 2020-01-20 VITALS — BP 138/62 | HR 67 | Ht 64.0 in | Wt 142.2 lb

## 2020-01-20 DIAGNOSIS — I10 Essential (primary) hypertension: Secondary | ICD-10-CM | POA: Diagnosis not present

## 2020-01-20 DIAGNOSIS — D6869 Other thrombophilia: Secondary | ICD-10-CM | POA: Diagnosis not present

## 2020-01-20 DIAGNOSIS — Z7901 Long term (current) use of anticoagulants: Secondary | ICD-10-CM | POA: Diagnosis not present

## 2020-01-20 DIAGNOSIS — I700 Atherosclerosis of aorta: Secondary | ICD-10-CM | POA: Diagnosis not present

## 2020-01-20 DIAGNOSIS — Z79899 Other long term (current) drug therapy: Secondary | ICD-10-CM

## 2020-01-20 DIAGNOSIS — E039 Hypothyroidism, unspecified: Secondary | ICD-10-CM | POA: Diagnosis not present

## 2020-01-20 DIAGNOSIS — I4819 Other persistent atrial fibrillation: Secondary | ICD-10-CM

## 2020-01-20 LAB — BASIC METABOLIC PANEL
BUN/Creatinine Ratio: 9 — ABNORMAL LOW (ref 12–28)
BUN: 11 mg/dL (ref 8–27)
CO2: 26 mmol/L (ref 20–29)
Calcium: 9.5 mg/dL (ref 8.7–10.3)
Chloride: 91 mmol/L — ABNORMAL LOW (ref 96–106)
Creatinine, Ser: 1.18 mg/dL — ABNORMAL HIGH (ref 0.57–1.00)
GFR calc Af Amer: 50 mL/min/{1.73_m2} — ABNORMAL LOW (ref >59)
GFR calc non Af Amer: 43 mL/min/{1.73_m2} — ABNORMAL LOW (ref >59)
Glucose: 95 mg/dL (ref 65–99)
Potassium: 3.8 mmol/L (ref 3.5–5.2)
Sodium: 132 mmol/L — ABNORMAL LOW (ref 134–144)

## 2020-01-27 ENCOUNTER — Other Ambulatory Visit: Payer: Self-pay | Admitting: *Deleted

## 2020-01-27 DIAGNOSIS — I422 Other hypertrophic cardiomyopathy: Secondary | ICD-10-CM

## 2020-01-27 DIAGNOSIS — E871 Hypo-osmolality and hyponatremia: Secondary | ICD-10-CM

## 2020-01-27 DIAGNOSIS — Z79899 Other long term (current) drug therapy: Secondary | ICD-10-CM

## 2020-01-27 MED ORDER — LOSARTAN POTASSIUM 100 MG PO TABS: ORAL_TABLET | ORAL | 3 refills | Status: DC

## 2020-01-27 NOTE — Addendum Note (Signed)
Addended by: Johney Frame A on: 01/27/2020 04:01 PM   Modules accepted: Orders

## 2020-02-03 ENCOUNTER — Other Ambulatory Visit: Payer: Self-pay

## 2020-02-03 DIAGNOSIS — I422 Other hypertrophic cardiomyopathy: Secondary | ICD-10-CM | POA: Diagnosis not present

## 2020-02-03 DIAGNOSIS — Z79899 Other long term (current) drug therapy: Secondary | ICD-10-CM | POA: Diagnosis not present

## 2020-02-03 DIAGNOSIS — E871 Hypo-osmolality and hyponatremia: Secondary | ICD-10-CM | POA: Diagnosis not present

## 2020-02-04 LAB — BASIC METABOLIC PANEL
BUN/Creatinine Ratio: 9 — ABNORMAL LOW (ref 12–28)
BUN: 11 mg/dL (ref 8–27)
CO2: 22 mmol/L (ref 20–29)
Calcium: 9.7 mg/dL (ref 8.7–10.3)
Chloride: 98 mmol/L (ref 96–106)
Creatinine, Ser: 1.27 mg/dL — ABNORMAL HIGH (ref 0.57–1.00)
GFR calc Af Amer: 45 mL/min/{1.73_m2} — ABNORMAL LOW (ref >59)
GFR calc non Af Amer: 39 mL/min/{1.73_m2} — ABNORMAL LOW (ref >59)
Glucose: 93 mg/dL (ref 65–99)
Potassium: 4.9 mmol/L (ref 3.5–5.2)
Sodium: 135 mmol/L (ref 134–144)

## 2020-02-12 NOTE — Progress Notes (Signed)
Cardiology Office Note:    Date:  02/14/2020   ID:  Paula [name], DOB 1935-07-29, MRN [id number]  PCP:  Lajean Manes, MD  Cardiologist:  No primary care provider on file.  Electrophysiologist:  None   Referring MD: Lajean Manes, MD   Chief Complaint  Patient presents with  . Hypertension    History of Present Illness:    Paula [name] is a 85 y.o. female with a hx of paroxysmal atrial fibrillation, hypertension, hypothyroidism who is referred by Paula So, PA for evaluation of hypertrophic cardiomyopathy.    Diagnosed with atrial fibrillation after presenting with palpitations to PCP office in April. EKG showed A. fib with RVR. Started on Eliquis given CHA2DS2-VASc score 5.  Also started on diltiazem for rate control. She was seen in AF clinic on 05/11/2019, she was noted to be in normal sinus rhythm. She was continued on Eliquis 5 mg twice daily and diltiazem 180 mg daily. TTE on 05/27/19 was concerning for apical variant hypertrophic cardiomyopathy, EF 60 to 65%, normal RV function, moderate elevation in RVSP (48 mmHg), mild mitral regurgitation, mild to moderate tricuspid regurgitation.  Cardiac MRI on 07/05/2019 showed LV apical hypertrophy measuring up to 15 mm, consistent with apical hypertrophic cardiomyopathy, small apical aneurysm, patchy apical LGE accounting for 1% of total myocardial mass, hyperdynamic LV systolic function, normal RV function, mild MR (regurgitant fraction 24%).  Zio patch x7 days on 07/14/2019 showed AF burden 47%, with episode lasting 3 days and average rate 118 bpm, no NSVT, frequent episodes of SVT, longest lasting 19 seconds.  Since last clinic visit, she reports that she has been doing well.  She does note some dizziness after taking her medications in the morning.  Has not checked blood pressure during these episodes but has noted heart rate will sometimes be down to 40s.  She denies any chest pain or dyspnea.  Denies any syncope.   Taking Eliquis, denies any bleeding issues.  No recent palpitations.  Brought BP log with her heart rate has been in 50s to 60s,  BP 120s to 130s in AM, but has been up to 150s to 160s in the evening.  Past Medical History:  Diagnosis Date  . Irregular heart rate   . Thyroid disease     No past surgical history on file.  Current Medications: Current Meds  Medication Sig  . alendronate (FOSAMAX) 70 MG tablet   . amiodarone (PACERONE) 200 MG tablet Take 1 tablet (200 mg total) by mouth daily.  Marland Kitchen atorvastatin (LIPITOR) 10 MG tablet Take 10 mg by mouth daily at 6 PM.  . calcium carbonate (OS-CAL - DOSED IN MG OF ELEMENTAL CALCIUM) 1250 (500 Ca) MG tablet Take 1 tablet by mouth daily with breakfast.  . diltiazem (CARDIZEM CD) 360 MG 24 hr capsule Take 1 capsule (360 mg total) by mouth daily.  Marland Kitchen ELIQUIS 5 MG TABS tablet Take 5 mg by mouth in the morning and at bedtime.  . famotidine (PEPCID) 10 MG tablet Take 10 mg by mouth at bedtime.  Marland Kitchen levothyroxine (SYNTHROID, LEVOTHROID) 88 MCG tablet Take 88 mcg by mouth daily before breakfast.  . losartan (COZAAR) 100 MG tablet Take 1 tablet (100 mg total) by mouth every morning AND 0.5 tablets (50 mg total) every evening.  . Multiple Vitamin (MULTIVITAMIN WITH MINERALS) TABS tablet Take 1 tablet by mouth daily.  . prazosin (MINIPRESS) 1 MG capsule Take 1 capsule (1 mg total) by mouth at bedtime.  . sodium chloride (  OCEAN) 0.65 % SOLN nasal spray Place 1 spray into both nostrils as needed for congestion.     Allergies:   Wound dressing adhesive   Social History   Socioeconomic History  . Marital status: Married    Spouse name: Not on file  . Number of children: Not on file  . Years of education: Not on file  . Highest education level: Not on file  Occupational History  . Not on file  Tobacco Use  . Smoking status: Never Smoker  . Smokeless tobacco: Never Used  Substance and Sexual Activity  . Alcohol use: Yes    Alcohol/week: 1.0 standard  drink    Types: 1 Glasses of wine per week  . Drug use: No  . Sexual activity: Not on file  Other Topics Concern  . Not on file  Social History Narrative  . Not on file   Social Determinants of Health   Financial Resource Strain: Not on file  Food Insecurity: Not on file  Transportation Needs: Not on file  Physical Activity: Not on file  Stress: Not on file  Social Connections: Not on file     Family History: The patient's family history includes Breast cancer (age of onset: 36) in her sister.  ROS:   Please see the history of present illness.     All other systems reviewed and are negative.  EKGs/Labs/Other Studies Reviewed:    The following studies were reviewed today:   EKG:  EKG is ordered today.  The ekg ordered today demonstrates sinus rhythm, rate 70, LVH with repolarization abnormalities  Recent Labs: 09/21/2019: ALT 25; TSH 4.624 02/03/2020: BUN 11; Creatinine, Ser 1.27; Potassium 4.9; Sodium 135  Recent Lipid Panel No results found for: CHOL, TRIG, HDL, CHOLHDL, VLDL, LDLCALC, LDLDIRECT  Physical Exam:    VS:  BP 132/70   Pulse 70   Ht 5\' 4"  (1.626 m)   Wt 140 lb 9.6 oz (63.8 kg)   SpO2 98%   BMI 24.13 kg/m     Wt Readings from Last 3 Encounters:  02/14/20 140 lb 9.6 oz (63.8 kg)  01/20/20 142 lb 3.2 oz (64.5 kg)  12/15/19 142 lb (64.4 kg)     GEN: in no acute distress HEENT: Normal NECK: No JVD; No carotid bruits CARDIAC: RRR, 2/6 systolic murmur RESPIRATORY:  Clear to auscultation without rales, wheezing or rhonchi  ABDOMEN: Soft, non-tender, non-distended MUSCULOSKELETAL:  No edema; No deformity  SKIN: Warm and dry NEUROLOGIC:  Alert and oriented x 3 PSYCHIATRIC:  Normal affect   ASSESSMENT:    1. Hypertrophic cardiomyopathy (HCC)   2. Paroxysmal atrial fibrillation ([place])   3. SVT (supraventricular tachycardia) ([place])   4. Essential hypertension   5. Hyperlipidemia, unspecified hyperlipidemia type    PLAN:    Hypertrophic  cardiomyopathy: cardiac MRI on 07/05/2019 showed LV apical hypertrophy measuring up to 15 mm, consistent with apical hypertrophic cardiomyopathy, small apical aneurysm, patchy apical LGE accounting for 1% of total myocardial mass.  No NSVT on Zio patch x 7 days -Would recommend her son be screened for HCM, reports he has an appointment for an echocardiogram  Paroxysmal atrial fibrillation: Diagnosed 04/2019 after presenting to PCPs office with palpitations. CHA2DS2-VASc score 5.  Zio patch x7 days on 07/14/2019 showed AF burden 47%, with episode lasting 3 days and average rate 118 bpm. -Continue Eliquis 5 mg twice daily -Continue diltiazem 360 mg daily -Follows with AF clinic, started on amiodarone 200 mg daily  SVT: Frequent episodes on  recent ZIO monitoring, longest lasting 19 seconds.  Continue diltiazem  Hypertension: Follows in hypertension clinic, on losartan 100 mg every morning/50 mg nightly, diltiazem 360 mg daily, and prazosin 1 mg nightly.  Appears controlled  Hyperlipidemia: On atorvastatin 10 mg daily  RTC in 3 months  Medication Adjustments/Labs and Tests Ordered: Current medicines are reviewed at length with the patient today.  Concerns regarding medicines are outlined above.  Orders Placed This Encounter  Procedures  . EKG 12-Lead   No orders of the defined types were placed in this encounter.   Patient Instructions  Medication Instructions:  Your physician recommends that you continue on your current medications as directed. Please refer to the Current Medication list given to you today.  *If you need a refill on your cardiac medications before your next appointment, please call your pharmacy*  Follow-Up: At [facility], you and your health needs are our priority.  As part of our continuing mission to provide you with exceptional heart care, we have created designated Provider Care Teams.  These Care Teams include your primary Cardiologist (physician) and Advanced  Practice Providers (APPs -  Physician Assistants and Nurse Practitioners) who all work together to provide you with the care you need, when you need it.  We recommend signing up for the patient portal called "MyChart".  Sign up information is provided on this After Visit Summary.  MyChart is used to connect with patients for Virtual Visits (Telemedicine).  Patients are able to view lab/test results, encounter notes, upcoming appointments, etc.  Non-urgent messages can be sent to your provider as well.   To learn more about what you can do with MyChart, go to [email or web address].    Your next appointment:   3 month(s)  The format for your next appointment:   In Person  Provider:   Oswaldo Milian, MD   Other Instructions When you have an episode of lightheadedness, try to check your blood pressure and heart rate and let us know.      Signed, Donato Heinz, MD  02/14/2020 5:56 PM    [place]

## 2020-02-14 ENCOUNTER — Ambulatory Visit: Payer: Medicare HMO | Admitting: Cardiology

## 2020-02-14 ENCOUNTER — Encounter: Payer: Self-pay | Admitting: Cardiology

## 2020-02-14 ENCOUNTER — Other Ambulatory Visit: Payer: Self-pay

## 2020-02-14 VITALS — BP 132/70 | HR 70 | Ht 64.0 in | Wt 140.6 lb

## 2020-02-14 DIAGNOSIS — E785 Hyperlipidemia, unspecified: Secondary | ICD-10-CM

## 2020-02-14 DIAGNOSIS — I480 Paroxysmal atrial fibrillation: Secondary | ICD-10-CM | POA: Diagnosis not present

## 2020-02-14 DIAGNOSIS — I422 Other hypertrophic cardiomyopathy: Secondary | ICD-10-CM | POA: Diagnosis not present

## 2020-02-14 DIAGNOSIS — I10 Essential (primary) hypertension: Secondary | ICD-10-CM | POA: Diagnosis not present

## 2020-02-14 DIAGNOSIS — I471 Supraventricular tachycardia: Secondary | ICD-10-CM | POA: Diagnosis not present

## 2020-02-14 NOTE — Patient Instructions (Signed)
Medication Instructions:  Your physician recommends that you continue on your current medications as directed. Please refer to the Current Medication list given to you today.  *If you need a refill on your cardiac medications before your next appointment, please call your pharmacy*  Follow-Up: At [facility], you and your health needs are our priority.  As part of our continuing mission to provide you with exceptional heart care, we have created designated Provider Care Teams.  These Care Teams include your primary Cardiologist (physician) and Advanced Practice Providers (APPs -  Physician Assistants and Nurse Practitioners) who all work together to provide you with the care you need, when you need it.  We recommend signing up for the patient portal called "MyChart".  Sign up information is provided on this After Visit Summary.  MyChart is used to connect with patients for Virtual Visits (Telemedicine).  Patients are able to view lab/test results, encounter notes, upcoming appointments, etc.  Non-urgent messages can be sent to your provider as well.   To learn more about what you can do with MyChart, go to [email or web address].    Your next appointment:   3 month(s)  The format for your next appointment:   In Person  Provider:   Oswaldo Milian, MD   Other Instructions When you have an episode of lightheadedness, try to check your blood pressure and heart rate and let us know.

## 2020-02-17 ENCOUNTER — Other Ambulatory Visit (HOSPITAL_COMMUNITY): Payer: Self-pay | Admitting: Physician Assistant

## 2020-02-24 MED ORDER — PRAZOSIN HCL 1 MG PO CAPS: 1.0000 mg | ORAL_CAPSULE | Freq: Every day | ORAL | 1 refills | Status: DC

## 2020-02-24 NOTE — Addendum Note (Signed)
Addended by: Cristopher Estimable on: 02/24/2020 02:12 PM   Modules accepted: Orders

## 2020-03-21 NOTE — Addendum Note (Signed)
Addended by: Patria Mane A on: 03/21/2020 08:14 AM   Modules accepted: Orders

## 2020-03-31 ENCOUNTER — Encounter: Payer: Self-pay | Admitting: Cardiology

## 2020-03-31 NOTE — Telephone Encounter (Signed)
Error

## 2020-04-13 ENCOUNTER — Ambulatory Visit (INDEPENDENT_AMBULATORY_CARE_PROVIDER_SITE_OTHER): Payer: Medicare HMO | Admitting: Otolaryngology

## 2020-04-13 ENCOUNTER — Other Ambulatory Visit: Payer: Self-pay

## 2020-04-13 VITALS — Temp 97.3°F

## 2020-04-13 DIAGNOSIS — H6123 Impacted cerumen, bilateral: Secondary | ICD-10-CM | POA: Diagnosis not present

## 2020-04-13 NOTE — Progress Notes (Signed)
HPI: Paula [name] is a 85 y.o. female who presents for evaluation of wax buildup in her ears especially on the left side.  She had a little bit of bleeding and had complaints of crusty wax.  I see her husband for wax buildup.  She is not having any hearing problems.  Past Medical History:  Diagnosis Date  . Irregular heart rate   . Thyroid disease    No past surgical history on file. Social History   Socioeconomic History  . Marital status: Married    Spouse name: Not on file  . Number of children: Not on file  . Years of education: Not on file  . Highest education level: Not on file  Occupational History  . Not on file  Tobacco Use  . Smoking status: Never Smoker  . Smokeless tobacco: Never Used  Substance and Sexual Activity  . Alcohol use: Yes    Alcohol/week: 1.0 standard drink    Types: 1 Glasses of wine per week  . Drug use: No  . Sexual activity: Not on file  Other Topics Concern  . Not on file  Social History Narrative  . Not on file   Social Determinants of Health   Financial Resource Strain: Not on file  Food Insecurity: Not on file  Transportation Needs: Not on file  Physical Activity: Not on file  Stress: Not on file  Social Connections: Not on file   Family History  Problem Relation Age of Onset  . Breast cancer Sister 80   Allergies  Allergen Reactions  . Wound Dressing Adhesive     Other reaction(s): rash   Prior to Admission medications   Medication Sig Start Date End Date Taking? Authorizing Provider  alendronate (FOSAMAX) 70 MG tablet  04/29/19   [provider]  amiodarone (PACERONE) 200 MG tablet TAKE 1 TABLET EVERY DAY 02/17/20   Fenton, Clint R, PA  atorvastatin (LIPITOR) 10 MG tablet Take 10 mg by mouth daily at 6 PM.    [provider]  calcium carbonate (OS-CAL - DOSED IN MG OF ELEMENTAL CALCIUM) 1250 (500 Ca) MG tablet Take 1 tablet by mouth daily with breakfast.    [provider]  diltiazem  (CARDIZEM CD) 360 MG 24 hr capsule Take 1 capsule (360 mg total) by mouth daily. 09/17/19   Donato Heinz, MD  ELIQUIS 5 MG TABS tablet Take 5 mg by mouth in the morning and at bedtime. 05/13/19   [provider]  famotidine (PEPCID) 10 MG tablet Take 10 mg by mouth at bedtime.    [provider]  levothyroxine (SYNTHROID, LEVOTHROID) 88 MCG tablet Take 88 mcg by mouth daily before breakfast.    [provider]  losartan (COZAAR) 100 MG tablet Take 1 tablet (100 mg total) by mouth every morning AND 0.5 tablets (50 mg total) every evening. 01/27/20   Donato Heinz, MD  Multiple Vitamin (MULTIVITAMIN WITH MINERALS) TABS tablet Take 1 tablet by mouth daily.    [provider]  prazosin (MINIPRESS) 1 MG capsule Take 1 capsule (1 mg total) by mouth at bedtime. 02/24/20   Donato Heinz, MD  sodium chloride (OCEAN) 0.65 % SOLN nasal spray Place 1 spray into both nostrils as needed for congestion.    [provider]     Positive ROS: Otherwise negative  All other systems have been reviewed and were otherwise negative with the exception of those mentioned in the HPI and as above.  Physical  Exam: Constitutional: Alert, well-appearing, no acute distress Ears: External ears without lesions or tenderness.  She had minimal wax buildup in both ear canals with some dried blood in the left ear canal that was cleaned with curettes and forceps.  TMs are clear bilaterally.  She is not a significant wax producer. Nasal: External nose without lesions.. Clear nasal passages Oral: Lips and gums without lesions. Tongue and palate mucosa without lesions. Posterior oropharynx clear. Neck: No palpable adenopathy or masses Respiratory: Breathing comfortably  Skin: No facial/neck lesions or rash noted.  Cerumen impaction removal  Date/Time: 04/13/2020 12:59 PM Performed by: Rozetta Nunnery, MD Authorized by: Rozetta Nunnery, MD    Consent:    Consent obtained:  Verbal   Consent given by:  Patient   Risks discussed:  Pain and bleeding Procedure details:    Location:  L ear and R ear   Procedure type: curette and forceps   Post-procedure details:    Inspection:  TM intact and canal normal   Hearing quality:  Improved   Patient tolerance of procedure:  Tolerated well, no immediate complications Comments:     TMs are clear bilaterally.    Assessment: Minimal wax buildup.  Plan: Cautioned her about not using Q-tips.  Suggested use of skin lotion and/or baby oil as needed crusting or scabbing within the ear.  TMs are otherwise clear.  Radene Journey, MD

## 2020-04-14 ENCOUNTER — Other Ambulatory Visit: Payer: Self-pay

## 2020-04-14 ENCOUNTER — Encounter: Payer: Self-pay | Admitting: Cardiology

## 2020-04-14 ENCOUNTER — Ambulatory Visit: Payer: Medicare HMO | Admitting: Cardiology

## 2020-04-14 ENCOUNTER — Ambulatory Visit (INDEPENDENT_AMBULATORY_CARE_PROVIDER_SITE_OTHER): Payer: Medicare HMO

## 2020-04-14 ENCOUNTER — Encounter: Payer: Self-pay | Admitting: Radiology

## 2020-04-14 VITALS — BP 185/79 | HR 74 | Ht 64.0 in | Wt 142.8 lb

## 2020-04-14 DIAGNOSIS — I480 Paroxysmal atrial fibrillation: Secondary | ICD-10-CM

## 2020-04-14 DIAGNOSIS — R062 Wheezing: Secondary | ICD-10-CM | POA: Diagnosis not present

## 2020-04-14 DIAGNOSIS — I422 Other hypertrophic cardiomyopathy: Secondary | ICD-10-CM | POA: Diagnosis not present

## 2020-04-14 DIAGNOSIS — R001 Bradycardia, unspecified: Secondary | ICD-10-CM

## 2020-04-14 DIAGNOSIS — R059 Cough, unspecified: Secondary | ICD-10-CM

## 2020-04-14 DIAGNOSIS — R42 Dizziness and giddiness: Secondary | ICD-10-CM

## 2020-04-14 MED ORDER — DILTIAZEM HCL ER COATED BEADS 300 MG PO CP24: 300.0000 mg | ORAL_CAPSULE | Freq: Every day | ORAL | 0 refills | Status: DC

## 2020-04-14 MED ORDER — AMIODARONE HCL 100 MG PO TABS: 100.0000 mg | ORAL_TABLET | Freq: Every day | ORAL | 3 refills | Status: DC

## 2020-04-14 NOTE — Progress Notes (Signed)
Cardiology Office Note:    Date:  04/16/2020   ID:  Paula [name], DOB 22-Jun-1935, MRN [id number]  PCP:  Lajean Manes, MD  Cardiologist:  No primary care provider on file.  Electrophysiologist:  None   Referring MD: Lajean Manes, MD   Chief Complaint  Patient presents with  . Dizziness    History of Present Illness:    Paula [name] is a 85 y.o. female with a hx of paroxysmal atrial fibrillation, hypertension, hypothyroidism who presents for follow-up.  She was referred by [name], PA for evaluation of hypertrophic cardiomyopathy, initially seen on 06/14/2019.  Diagnosed with atrial fibrillation after presenting with palpitations to PCP office in April. EKG showed A. fib with RVR. Started on Eliquis given CHA2DS2-VASc score 5.  Also started on diltiazem for rate control. She was seen in AF clinic on 05/11/2019, she was noted to be in normal sinus rhythm. She was continued on Eliquis 5 mg twice daily and diltiazem 180 mg daily. TTE on 05/27/19 was concerning for apical variant hypertrophic cardiomyopathy, EF 60 to 65%, normal RV function, moderate elevation in RVSP (48 mmHg), mild mitral regurgitation, mild to moderate tricuspid regurgitation.  Cardiac MRI on 07/05/2019 showed LV apical hypertrophy measuring up to 15 mm, consistent with apical hypertrophic cardiomyopathy, small apical aneurysm, patchy apical LGE accounting for 1% of total myocardial mass, hyperdynamic LV systolic function, normal RV function, mild MR (regurgitant fraction 24%).  Zio patch x7 days on 07/14/2019 showed AF burden 47%, with episode lasting 3 days and average rate 118 bpm, no NSVT, frequent episodes of SVT, longest lasting 19 seconds.  Since last clinic visit, she reports that she has been doing okay.  She does report that she has been having some dizziness and lightheadedness and noted pulse as low as 30s.  She denies any chest pain or dyspnea.  Does report she has been having coughing and  wheezing recently.  Denies any syncope.  She brought her BP log, which shows in the a.m. BP 97- 139/46-60 with heart rate 54-73.  Her p.m. BP has been 133-172/64-83 and heart rate 62-71.   Past Medical History:  Diagnosis Date  . Irregular heart rate   . Thyroid disease     No past surgical history on file.  Current Medications: Current Meds  Medication Sig  . alendronate (FOSAMAX) 70 MG tablet   . atorvastatin (LIPITOR) 10 MG tablet Take 10 mg by mouth daily at 6 PM.  . calcium carbonate (OS-CAL - DOSED IN MG OF ELEMENTAL CALCIUM) 1250 (500 Ca) MG tablet Take 1 tablet by mouth daily with breakfast.  . ELIQUIS 5 MG TABS tablet Take 5 mg by mouth in the morning and at bedtime.  . famotidine (PEPCID) 10 MG tablet Take 10 mg by mouth at bedtime.  Marland Kitchen levothyroxine (SYNTHROID, LEVOTHROID) 88 MCG tablet Take 88 mcg by mouth daily before breakfast.  . losartan (COZAAR) 100 MG tablet Take 1 tablet (100 mg total) by mouth every morning AND 0.5 tablets (50 mg total) every evening.  . Multiple Vitamin (MULTIVITAMIN WITH MINERALS) TABS tablet Take 1 tablet by mouth daily.  . prazosin (MINIPRESS) 1 MG capsule Take 1 capsule (1 mg total) by mouth at bedtime.  . sodium chloride (OCEAN) 0.65 % SOLN nasal spray Place 1 spray into both nostrils as needed for congestion.  . [DISCONTINUED] amiodarone (PACERONE) 200 MG tablet TAKE 1 TABLET EVERY DAY  . [DISCONTINUED] diltiazem (CARDIZEM CD) 360 MG 24 hr capsule Take 1 capsule (  360 mg total) by mouth daily.     Allergies:   Wound dressing adhesive   Social History   Socioeconomic History  . Marital status: Married    Spouse name: Not on file  . Number of children: Not on file  . Years of education: Not on file  . Highest education level: Not on file  Occupational History  . Not on file  Tobacco Use  . Smoking status: Never Smoker  . Smokeless tobacco: Never Used  Substance and Sexual Activity  . Alcohol use: Yes    Alcohol/week: 1.0 standard  drink    Types: 1 Glasses of wine per week  . Drug use: No  . Sexual activity: Not on file  Other Topics Concern  . Not on file  Social History Narrative  . Not on file   Social Determinants of Health   Financial Resource Strain: Not on file  Food Insecurity: Not on file  Transportation Needs: Not on file  Physical Activity: Not on file  Stress: Not on file  Social Connections: Not on file     Family History: The patient's family history includes Breast cancer (age of onset: 32) in her sister.  ROS:   Please see the history of present illness.     All other systems reviewed and are negative.  EKGs/Labs/Other Studies Reviewed:    The following studies were reviewed today:   EKG:  EKG is ordered today.  The ekg ordered today demonstrates sinus rhythm, rate 74, LVH with repolarization abnormalities  Recent Labs: 09/21/2019: ALT 25; TSH 4.624 02/03/2020: BUN 11; Creatinine, Ser 1.27; Potassium 4.9; Sodium 135  Recent Lipid Panel No results found for: CHOL, TRIG, HDL, CHOLHDL, VLDL, LDLCALC, LDLDIRECT  Physical Exam:    VS:  BP (!) 185/79   Pulse 74   Ht 5\' 4"  (1.626 m)   Wt 142 lb 12.8 oz (64.8 kg)   SpO2 97%   BMI 24.51 kg/m     Wt Readings from Last 3 Encounters:  04/14/20 142 lb 12.8 oz (64.8 kg)  02/14/20 140 lb 9.6 oz (63.8 kg)  01/20/20 142 lb 3.2 oz (64.5 kg)     GEN: in no acute distress HEENT: Normal NECK: No JVD; No carotid bruits CARDIAC: RRR, 2/6 systolic murmur RESPIRATORY:  Clear to auscultation without rales, wheezing or rhonchi  ABDOMEN: Soft, non-tender, non-distended MUSCULOSKELETAL:  No edema; No deformity  SKIN: Warm and dry NEUROLOGIC:  Alert and oriented x 3 PSYCHIATRIC:  Normal affect   ASSESSMENT:    1. Hypertrophic cardiomyopathy (HCC)   2. Paroxysmal atrial fibrillation ([place])   3. Dizziness   4. Cough   5. Wheezing   6. Bradycardia    PLAN:    Hypertrophic cardiomyopathy: cardiac MRI on 07/05/2019 showed LV apical  hypertrophy measuring up to 15 mm, consistent with apical hypertrophic cardiomyopathy, small apical aneurysm, patchy apical LGE accounting for 1% of total myocardial mass.  No NSVT on Zio patch x 7 days -Would recommend her son be screened for HCM, reports he has an appointment for an echocardiogram  Paroxysmal atrial fibrillation: Diagnosed 04/2019 after presenting to PCPs office with palpitations. CHA2DS2-VASc score 5.  Zio patch x7 days on 07/14/2019 showed AF burden 47%, with episode lasting 3 days and average rate 118 bpm. -Continue Eliquis 5 mg twice daily -Follows with AF clinic, started on amiodarone 200 mg daily -Currently on diltiazem 360 mg daily.  Heart rate appears okay on her BP log, but she reports intermittent dizziness  and has noted BP as low as 30s when checks at home.  Will decrease amiodarone to 100 mg daily and decrease diltiazem to 300 mg daily.  Will check Zio patch x3 days.  She is also reporting coughing/wheezing, will check chest x-ray and PFTs given amiodarone use  SVT: Frequent episodes on recent ZIO monitoring, longest lasting 19 seconds.  Continue diltiazem  Hypertension: Follows in hypertension clinic, on losartan 100 mg every morning/50 mg nightly, diltiazem 360 mg daily, and prazosin 1 mg nightly.  Appears fairly well controlled on her home BP log with elevated in clinic today.  Will decrease diltiazem due to bradycardia as above, may need to further adjust regimen, last to continue to check BP twice daily at home  Hyperlipidemia: On atorvastatin 10 mg daily  RTC in 3 months  Medication Adjustments/Labs and Tests Ordered: Current medicines are reviewed at length with the patient today.  Concerns regarding medicines are outlined above.  Orders Placed This Encounter  Procedures  . DG Chest 2 View  . LONG TERM MONITOR (3-14 DAYS)  . EKG 12-Lead  . Pulmonary function test   Meds ordered this encounter  Medications  . amiodarone (PACERONE) 100 MG tablet    Sig:  Take 1 tablet (100 mg total) by mouth daily.    Dispense:  90 tablet    Refill:  3    Dose decrease  . DISCONTD: diltiazem (CARDIZEM CD) 300 MG 24 hr capsule    Sig: Take 1 capsule (300 mg total) by mouth daily.    Dispense:  30 capsule    Refill:  0    Dose decrease  . diltiazem (CARDIZEM CD) 300 MG 24 hr capsule    Sig: Take 1 capsule (300 mg total) by mouth daily.    Dispense:  30 capsule    Refill:  0    Dose decrease    Patient Instructions  Medication Instructions:  DECREASE amiodarone to 100 mg daily DECREASE diltiazem 300 daily  *If you need a refill on your cardiac medications before your next appointment, please call your pharmacy*  Testing/Procedures: A chest x-ray takes a picture of the organs and structures inside the chest, including the heart, lungs, and blood vessels. This test can show several things, including, whether the heart is enlarges; whether fluid is building up in the lungs; and whether pacemaker / defibrillator leads are still in place.  Your physician has recommended that you have a pulmonary function test. Pulmonary Function Tests are a group of tests that measure how well air moves in and out of your lungs. --this requires a covid screening 3 days prior  ZIO XT- Long Term Monitor Instructions   Your physician has requested you wear your ZIO patch monitor___3____days.   This is a single patch monitor.  [employer] supplies one patch monitor per enrollment.  Additional stickers are not available.   Please do not apply patch if you will be having a Nuclear Stress Test, Echocardiogram, Cardiac CT, MRI, or Chest Xray during the time frame you would be wearing the monitor. The patch cannot be worn during these tests.  You cannot remove and re-apply the ZIO XT patch monitor.   Your ZIO patch monitor will be sent USPS Priority mail from [facility] directly to your home address. The monitor may also be mailed to a PO BOX if home delivery is not  available.   It may take 3-5 days to receive your monitor after you have been enrolled.   Once  you have received you monitor, please review enclosed instructions.  Your monitor has already been registered assigning a specific monitor serial # to you.   Applying the monitor   Shave hair from upper left chest.   Hold abrader disc by orange tab.  Rub abrader in 40 strokes over left upper chest as indicated in your monitor instructions.   Clean area with 4 enclosed alcohol pads .  Use all pads to assure are is cleaned thoroughly.  Let dry.   Apply patch as indicated in monitor instructions.  Patch will be place under collarbone on left side of chest with arrow pointing upward.   Rub patch adhesive wings for 2 minutes.Remove white label marked "1".  Remove white label marked "2".  Rub patch adhesive wings for 2 additional minutes.   While looking in a mirror, press and release button in center of patch.  A small green light will flash 3-4 times .  This will be your only indicator the monitor has been turned on.     Do not shower for the first 24 hours.  You may shower after the first 24 hours.   Press button if you feel a symptom. You will hear a small click.  Record Date, Time and Symptom in the Patient Log Book.   When you are ready to remove patch, follow instructions on last 2 pages of Patient Log Book.  Stick patch monitor onto last page of Patient Log Book.   Place Patient Log Book in [place] box.  Use locking tab on box and tape box closed securely.  The [employer] and [employer] has [employer] on it.  Please place in mailbox as soon as possible.  Your physician should have your test results approximately 7 days after the monitor has been mailed back to [facility].   Call [place] at [phone] if you have questions regarding your ZIO XT patch monitor.  Call them immediately if you see an orange light blinking on your monitor.   If your monitor falls off in less  than 4 days contact our Monitor department at [phone].  If your monitor becomes loose or falls off after 4 days call [employer] at [phone] for suggestions on securing your monitor.   Follow-Up: At [facility], you and your health needs are our priority.  As part of our continuing mission to provide you with exceptional heart care, we have created designated Provider Care Teams.  These Care Teams include your primary Cardiologist (physician) and Advanced Practice Providers (APPs -  Physician Assistants and Nurse Practitioners) who all work together to provide you with the care you need, when you need it.  We recommend signing up for the patient portal called "MyChart".  Sign up information is provided on this After Visit Summary.  MyChart is used to connect with patients for Virtual Visits (Telemedicine).  Patients are able to view lab/test results, encounter notes, upcoming appointments, etc.  Non-urgent messages can be sent to your provider as well.   To learn more about what you can do with MyChart, go to [email or web address].    Your next appointment:   3 month(s)  The format for your next appointment:   In Person  Provider:   Oswaldo Milian, MD        Signed, Donato Heinz, MD  04/16/2020 1:17 PM    [place]

## 2020-04-14 NOTE — Patient Instructions (Signed)
Medication Instructions:  DECREASE amiodarone to 100 mg daily DECREASE diltiazem 300 daily  *If you need a refill on your cardiac medications before your next appointment, please call your pharmacy*  Testing/Procedures: A chest x-ray takes a picture of the organs and structures inside the chest, including the heart, lungs, and blood vessels. This test can show several things, including, whether the heart is enlarges; whether fluid is building up in the lungs; and whether pacemaker / defibrillator leads are still in place.  Your physician has recommended that you have a pulmonary function test. Pulmonary Function Tests are a group of tests that measure how well air moves in and out of your lungs. --this requires a covid screening 3 days prior  ZIO XT- Long Term Monitor Instructions   Your physician has requested you wear your ZIO patch monitor___3____days.   This is a single patch monitor.  [employer] supplies one patch monitor per enrollment.  Additional stickers are not available.   Please do not apply patch if you will be having a Nuclear Stress Test, Echocardiogram, Cardiac CT, MRI, or Chest Xray during the time frame you would be wearing the monitor. The patch cannot be worn during these tests.  You cannot remove and re-apply the ZIO XT patch monitor.   Your ZIO patch monitor will be sent USPS Priority mail from [facility] directly to your home address. The monitor may also be mailed to a PO BOX if home delivery is not available.   It may take 3-5 days to receive your monitor after you have been enrolled.   Once you have received you monitor, please review enclosed instructions.  Your monitor has already been registered assigning a specific monitor serial # to you.   Applying the monitor   Shave hair from upper left chest.   Hold abrader disc by orange tab.  Rub abrader in 40 strokes over left upper chest as indicated in your monitor instructions.   Clean area with 4 enclosed  alcohol pads .  Use all pads to assure are is cleaned thoroughly.  Let dry.   Apply patch as indicated in monitor instructions.  Patch will be place under collarbone on left side of chest with arrow pointing upward.   Rub patch adhesive wings for 2 minutes.Remove white label marked "1".  Remove white label marked "2".  Rub patch adhesive wings for 2 additional minutes.   While looking in a mirror, press and release button in center of patch.  A small green light will flash 3-4 times .  This will be your only indicator the monitor has been turned on.     Do not shower for the first 24 hours.  You may shower after the first 24 hours.   Press button if you feel a symptom. You will hear a small click.  Record Date, Time and Symptom in the Patient Log Book.   When you are ready to remove patch, follow instructions on last 2 pages of Patient Log Book.  Stick patch monitor onto last page of Patient Log Book.   Place Patient Log Book in [place] box.  Use locking tab on box and tape box closed securely.  The [employer] has [employer] on it.  Please place in mailbox as soon as possible.  Your physician should have your test results approximately 7 days after the monitor has been mailed back to [facility].   Call [place] at [phone] if you have questions regarding your  ZIO XT patch monitor.  Call them immediately if you see an orange light blinking on your monitor.   If your monitor falls off in less than 4 days contact our Monitor department at [phone].  If your monitor becomes loose or falls off after 4 days call [employer] at [phone] for suggestions on securing your monitor.   Follow-Up: At [facility], you and your health needs are our priority.  As part of our continuing mission to provide you with exceptional heart care, we have created designated Provider Care Teams.  These Care Teams include your primary Cardiologist (physician) and Advanced  Practice Providers (APPs -  Physician Assistants and Nurse Practitioners) who all work together to provide you with the care you need, when you need it.  We recommend signing up for the patient portal called "MyChart".  Sign up information is provided on this After Visit Summary.  MyChart is used to connect with patients for Virtual Visits (Telemedicine).  Patients are able to view lab/test results, encounter notes, upcoming appointments, etc.  Non-urgent messages can be sent to your provider as well.   To learn more about what you can do with MyChart, go to [email or web address].    Your next appointment:   3 month(s)  The format for your next appointment:   In Person  Provider:   Oswaldo Milian, MD

## 2020-04-14 NOTE — Progress Notes (Signed)
Enrolled patient for a 3 day Zio XT Monitor to be mailed to patients home.  °

## 2020-04-17 ENCOUNTER — Other Ambulatory Visit: Payer: Self-pay

## 2020-04-17 ENCOUNTER — Telehealth: Payer: Self-pay | Admitting: Cardiology

## 2020-04-17 ENCOUNTER — Ambulatory Visit
Admission: RE | Admit: 2020-04-17 | Discharge: 2020-04-17 | Disposition: A | Discharge status: Home / Self Care | Payer: Medicare HMO | Source: Ambulatory Visit | Attending: Cardiology | Admitting: Cardiology

## 2020-04-17 DIAGNOSIS — R062 Wheezing: Secondary | ICD-10-CM

## 2020-04-17 DIAGNOSIS — R059 Cough, unspecified: Secondary | ICD-10-CM

## 2020-04-17 NOTE — Telephone Encounter (Signed)
Spoke with patient regarding 04/21/20 1:00pm PFT appointment at [facility]---arrival time is 12:30 1st floor admissions office for check in.  Patient scheduled Wednesday 04/19/20 at 11:35 am for COVID prescreening.  Patient voiced her understanding.

## 2020-04-17 NOTE — Telephone Encounter (Signed)
Spoke with patient concerning preferred weekdays and times for scheduling the PFT ordered by Dr. Truett Perna to schedule with respiratory therapy--had to leave voice mail---will continue to call to schedule.

## 2020-04-19 ENCOUNTER — Other Ambulatory Visit (HOSPITAL_COMMUNITY)
Admission: RE | Admit: 2020-04-19 | Discharge: 2020-04-19 | Disposition: A | Discharge status: Home / Self Care | Payer: Medicare HMO | Source: Ambulatory Visit | Attending: Cardiology | Admitting: Cardiology

## 2020-04-19 DIAGNOSIS — Z01812 Encounter for preprocedural laboratory examination: Secondary | ICD-10-CM | POA: Diagnosis not present

## 2020-04-19 DIAGNOSIS — Z20822 Contact with and (suspected) exposure to covid-19: Secondary | ICD-10-CM | POA: Insufficient documentation

## 2020-04-19 LAB — SARS CORONAVIRUS 2 (TAT 6-24 HRS): SARS Coronavirus 2: NEGATIVE

## 2020-04-21 ENCOUNTER — Other Ambulatory Visit: Payer: Self-pay

## 2020-04-21 ENCOUNTER — Ambulatory Visit (HOSPITAL_COMMUNITY)
Admission: RE | Admit: 2020-04-21 | Discharge: 2020-04-21 | Disposition: A | Discharge status: Home / Self Care | Payer: Medicare HMO | Source: Ambulatory Visit | Attending: Cardiology | Admitting: Cardiology

## 2020-04-21 ENCOUNTER — Other Ambulatory Visit: Payer: Self-pay | Admitting: Cardiology

## 2020-04-21 DIAGNOSIS — R001 Bradycardia, unspecified: Secondary | ICD-10-CM

## 2020-04-21 DIAGNOSIS — R062 Wheezing: Secondary | ICD-10-CM | POA: Insufficient documentation

## 2020-04-21 DIAGNOSIS — R059 Cough, unspecified: Secondary | ICD-10-CM | POA: Insufficient documentation

## 2020-04-21 LAB — PULMONARY FUNCTION TEST
DL/VA % pred: 117 %
DL/VA: 4.76 ml/min/mmHg/L
DLCO unc % pred: 89 %
DLCO unc: 16.79 ml/min/mmHg
FEF 25-75 Post: 2.4 L/sec
FEF 25-75 Pre: 1.83 L/sec
FEF2575-%Change-Post: 31 %
FEF2575-%Pred-Post: 183 %
FEF2575-%Pred-Pre: 139 %
FEV1-%Change-Post: 11 %
FEV1-%Pred-Post: 97 %
FEV1-%Pred-Pre: 87 %
FEV1-Post: 1.82 L
FEV1-Pre: 1.64 L
FEV1FVC-%Change-Post: 0 %
FEV1FVC-%Pred-Pre: 112 %
FEV6-%Change-Post: 11 %
FEV6-%Pred-Post: 92 %
FEV6-%Pred-Pre: 83 %
FEV6-Post: 2.21 L
FEV6-Pre: 1.98 L
FEV6FVC-%Change-Post: 0 %
FEV6FVC-%Pred-Post: 105 %
FEV6FVC-%Pred-Pre: 106 %
FVC-%Change-Post: 12 %
FVC-%Pred-Post: 87 %
FVC-%Pred-Pre: 78 %
FVC-Post: 2.22 L
Post FEV1/FVC ratio: 82 %
Post FEV6/FVC ratio: 100 %
Pre FEV1/FVC ratio: 83 %
Pre FEV6/FVC Ratio: 100 %
RV % pred: 73 %
RV: 1.78 L
TLC % pred: 80 %
TLC: 4.08 L

## 2020-04-21 MED ORDER — ALBUTEROL SULFATE (2.5 MG/3ML) 0.083% IN NEBU
2.5000 mg | INHALATION_SOLUTION | Freq: Once | RESPIRATORY_TRACT | Status: AC
  Administered 2020-04-21: 2.5 mg via RESPIRATORY_TRACT

## 2020-04-25 ENCOUNTER — Other Ambulatory Visit: Payer: Self-pay

## 2020-04-25 ENCOUNTER — Ambulatory Visit: Payer: Medicare HMO | Admitting: Genetic Counselor

## 2020-04-28 DIAGNOSIS — R001 Bradycardia, unspecified: Secondary | ICD-10-CM | POA: Diagnosis not present

## 2020-05-04 ENCOUNTER — Encounter: Payer: Medicare HMO | Admitting: Genetic Counselor

## 2020-05-05 NOTE — Progress Notes (Signed)
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 She reports feeling uncoordinated and light headed while driving. Went to her doctor and was referred for cardiac screening. Her cardiac MRI detected apical thickening of 1.5 cm with a small aneurysm and minimal scar burden of 1%.   Traditional Risk Factors Paula [name] was diagnosed with hypertension at age 58 and states that it is well controlled by medication.   Family history Paula [name] (III.1) has a 8 y.o. son who is in good health and has not yet undergone screening for HCM. She is the eldest of three girls, her middle sister (III.2) died of cancer at age 7 and youngest sister, age 71 is currently in good health.  [name]'s father (II.1) died at 92 from cancer. His siblings (II.1-II.2) died of old age. His mother (I.2) died of CHF in her 67s. Her mother died of a cardiac arrest at age 74. Her siblings died of Alzheimer's disease (II.5, II.7) and cancer (II.8). Maternal grandfather (I.3) died of stroke in his 74s and grandmother (I.4) died of ALS.   Genetics [name] was counseled on the genetics of hypertrophic cardiomyopathy (HCM). I explained to her that this is an autosomal dominant condition and hence her son and sister have a 50% chance of inheriting HCM. Clinical screening involves echocardiogram and EKG at regular intervals, frequency is typically determined by age, with those over the age of 13 getting screened every 5 years. She verbalized understanding of this.   I discussed penetrance of HCM being incomplete i.e. not all individuals harboring a HCM mutation will present clinically with HCM, and age-related penetrance where clinical presentation of HCM increases with advanced age. Based on her family  history it is not clear if she inherited this condition as there are no first-degree relatives with HCM or sudden death. It is likely that she is de novo for HCM in her family.  I also reviewed variable expression of HCM and emphasized that this condition can express at any age at level of severity in the family informed her that some patients - about 8-10% can have compound and digenic mutations for HCM. Also briefly discussed the inheritance pattern and treatment /management plans for the infiltrative cardiomyopathies that present as HCM phenocopies.   We walked through the process of genetic testing. I explained to her that genetic testing is a probabilistic test dependent upon her age and severity of presentation, presence of risk factors for HCM and importantly family history of HCM or sudden death in first-degree relatives. She verbalized understanding of this.   The potential outcomes of genetic testing and subsequent management of at-risk family members were discussed so as to manage expectations-  I explained to her that if a mutation is not identified, then all first-degree relatives should regular screening for HCM. I emphasized that even if the genetic test is negative, it does not mean that she does not have HCM. A negative test result can be due to limitations of the genetic test. She verbalized understanding of this.  There is also the likelihood of identifying a "Variant of unknown significance". This result means that the variant has not been detected in a statistically significant number of HCM patients and functional studies have not been performed to verify its pathogenicity. This VUS can be tested in the  family to see if it segregates with disease. If a VUS is found, first-degree relatives should undergo regular clinical screening for HCM.  If a pathogenic variant is reported, then her first-degree family members can get tested for this variant. If they test positive, there is a high  likelihood that they can develop HCM. In light of variable expression and incomplete penetrance associated with HCM, it is not possible to predict when they will manifest clinically with HCM. It is recommended that family members that test positive for the familial pathogenic variant pursue clinical screening for HCM. Family members that test negative for the familial mutation need not pursue periodic screening for HCM, but seek care if the symptoms develop.   Impression and Plan  [name] was diagnosed with HCM at age 25. There is no significant family history of HCM or sudden death amongst her first-degree relatives. Her late age of presentation and apical presentation, in the absence of a family history of HCM is associated with very low yield of detecting a mutation. Additionally, her medical insurance considers genetic testing for HCM investigational and will not cover this test. It is recommended that her son undergo regular screening for HCM. If he is found to have cardiac wall thickness indicative of HCM, then he can pursue genetic testing. She verbalized understanding of this.  Please note that the patient has not been counseled in this visit on personal, cultural or ethical issues that she may face due to her heart condition.   Paula [name], Ph.D, [facility] Clinical Molecular Geneticist

## 2020-05-08 ENCOUNTER — Other Ambulatory Visit: Payer: Self-pay | Admitting: Cardiology

## 2020-05-08 NOTE — Telephone Encounter (Signed)
This is Dr. Schumann's pt 

## 2020-05-08 NOTE — Telephone Encounter (Signed)
Rx has been sent to the pharmacy electronically. ° °

## 2020-05-10 DIAGNOSIS — I700 Atherosclerosis of aorta: Secondary | ICD-10-CM | POA: Diagnosis not present

## 2020-05-10 DIAGNOSIS — Z1389 Encounter for screening for other disorder: Secondary | ICD-10-CM | POA: Diagnosis not present

## 2020-05-10 DIAGNOSIS — Z0000 Encounter for general adult medical examination without abnormal findings: Secondary | ICD-10-CM | POA: Diagnosis not present

## 2020-05-10 DIAGNOSIS — E039 Hypothyroidism, unspecified: Secondary | ICD-10-CM | POA: Diagnosis not present

## 2020-05-10 DIAGNOSIS — I480 Paroxysmal atrial fibrillation: Secondary | ICD-10-CM | POA: Diagnosis not present

## 2020-05-10 DIAGNOSIS — I10 Essential (primary) hypertension: Secondary | ICD-10-CM | POA: Diagnosis not present

## 2020-05-10 DIAGNOSIS — D6869 Other thrombophilia: Secondary | ICD-10-CM | POA: Diagnosis not present

## 2020-05-10 DIAGNOSIS — Z79899 Other long term (current) drug therapy: Secondary | ICD-10-CM | POA: Diagnosis not present

## 2020-05-10 DIAGNOSIS — K219 Gastro-esophageal reflux disease without esophagitis: Secondary | ICD-10-CM | POA: Diagnosis not present

## 2020-05-10 DIAGNOSIS — E7800 Pure hypercholesterolemia, unspecified: Secondary | ICD-10-CM | POA: Diagnosis not present

## 2020-05-23 ENCOUNTER — Ambulatory Visit: Payer: Medicare HMO | Admitting: Cardiology

## 2020-06-28 DIAGNOSIS — Z23 Encounter for immunization: Secondary | ICD-10-CM | POA: Diagnosis not present

## 2020-07-08 NOTE — Progress Notes (Deleted)
Cardiology Office Note:    Date:  07/08/2020   ID:  Paula [name], DOB 29-Sep-1935, MRN [id number]  PCP:  Lajean Manes, MD  Cardiologist:  None  Electrophysiologist:  None   Referring MD: Lajean Manes, MD   No chief complaint on file.   History of Present Illness:    Paula [name] is a 85 y.o. female with a hx of paroxysmal atrial fibrillation, hypertension, hypothyroidism who presents for follow-up.  She was referred by [name], PA for evaluation of hypertrophic cardiomyopathy, initially seen on 06/14/2019.  Diagnosed with atrial fibrillation after presenting with palpitations to PCP office in April. EKG showed A. fib with RVR. Started on Eliquis given CHA2DS2-VASc score 5.  Also started on diltiazem for rate control. She was seen in AF clinic on 05/11/2019, she was noted to be in normal sinus rhythm. She was continued on Eliquis 5 mg twice daily and diltiazem 180 mg daily. TTE on 05/27/19 was concerning for apical variant hypertrophic cardiomyopathy, EF 60 to 65%, normal RV function, moderate elevation in RVSP (48 mmHg), mild mitral regurgitation, mild to moderate tricuspid regurgitation.  Cardiac MRI on 07/05/2019 showed LV apical hypertrophy measuring up to 15 mm, consistent with apical hypertrophic cardiomyopathy, small apical aneurysm, patchy apical LGE accounting for 1% of total myocardial mass, hyperdynamic LV systolic function, normal RV function, mild MR (regurgitant fraction 24%).  Zio patch x7 days on 07/14/2019 showed AF burden 47%, with episode lasting 3 days and average rate 118 bpm, no NSVT, frequent episodes of SVT, longest lasting 19 seconds.  Since last clinic visit, she reports that she has been doing okay.  She does report that she has been having some dizziness and lightheadedness and noted pulse as low as 30s.  She denies any chest pain or dyspnea.  Does report she has been having coughing and wheezing recently.  Denies any syncope.  She brought her BP  log, which shows in the a.m. BP 97- 139/46-60 with heart rate 54-73.  Her p.m. BP has been 133-172/64-83 and heart rate 62-71.   Past Medical History:  Diagnosis Date   Irregular heart rate    Thyroid disease     No past surgical history on file.  Current Medications: No outpatient medications have been marked as taking for the 07/10/20 encounter (Appointment) with Donato Heinz, MD.     Allergies:   Wound dressing adhesive   Social History   Socioeconomic History   Marital status: Married    Spouse name: Not on file   Number of children: Not on file   Years of education: Not on file   Highest education level: Not on file  Occupational History   Not on file  Tobacco Use   Smoking status: Never   Smokeless tobacco: Never  Substance and Sexual Activity   Alcohol use: Yes    Alcohol/week: 1.0 standard drink    Types: 1 Glasses of wine per week   Drug use: No   Sexual activity: Not on file  Other Topics Concern   Not on file  Social History Narrative   Not on file   Social Determinants of Health   Financial Resource Strain: Not on file  Food Insecurity: Not on file  Transportation Needs: Not on file  Physical Activity: Not on file  Stress: Not on file  Social Connections: Not on file     Family History: The patient's family history includes Breast cancer (age of onset: 62) in her sister.  ROS:  Please see the history of present illness.     All other systems reviewed and are negative.  EKGs/Labs/Other Studies Reviewed:    The following studies were reviewed today:   EKG:  EKG is ordered today.  The ekg ordered today demonstrates sinus rhythm, rate 74, LVH with repolarization abnormalities  Recent Labs: 09/21/2019: ALT 25; TSH 4.624 02/03/2020: BUN 11; Creatinine, Ser 1.27; Potassium 4.9; Sodium 135  Recent Lipid Panel No results found for: CHOL, TRIG, HDL, CHOLHDL, VLDL, LDLCALC, LDLDIRECT  Physical Exam:    VS:  There were no vitals taken  for this visit.    Wt Readings from Last 3 Encounters:  04/14/20 142 lb 12.8 oz (64.8 kg)  02/14/20 140 lb 9.6 oz (63.8 kg)  01/20/20 142 lb 3.2 oz (64.5 kg)     GEN: in no acute distress HEENT: Normal NECK: No JVD; No carotid bruits CARDIAC: RRR, 2/6 systolic murmur RESPIRATORY:  Clear to auscultation without rales, wheezing or rhonchi  ABDOMEN: Soft, non-tender, non-distended MUSCULOSKELETAL:  No edema; No deformity  SKIN: Warm and dry NEUROLOGIC:  Alert and oriented x 3 PSYCHIATRIC:  Normal affect   ASSESSMENT:    No diagnosis found.  PLAN:    Hypertrophic cardiomyopathy: cardiac MRI on 07/05/2019 showed LV apical hypertrophy measuring up to 15 mm, consistent with apical hypertrophic cardiomyopathy, small apical aneurysm, patchy apical LGE accounting for 1% of total myocardial mass.  No NSVT on Zio patch x 7 days -Would recommend her son be screened for HCM, reports he has an appointment for an echocardiogram  Paroxysmal atrial fibrillation: Diagnosed 04/2019 after presenting to PCPs office with palpitations. CHA2DS2-VASc score 5.  Zio patch x7 days on 07/14/2019 showed AF burden 47%, with episode lasting 3 days and average rate 118 bpm. -Continue Eliquis 5 mg twice daily -Follows with AF clinic, started on amiodarone 200 mg daily -Amiodarone dose reduced to 100 mg daily and diltiazem dose decreased to 300 mg daily given lightheadedness/bradycardia reported at home -Also reported having coughing/wheezing, PFT showed normal lung function and chest x-ray unremarkable  SVT: Frequent episodes on recent ZIO monitoring, longest lasting 19 seconds.  Continue diltiazem  Hypertension: Follows in hypertension clinic, on losartan 100 mg every morning/50 mg nightly, diltiazem 360 mg daily, and prazosin 1 mg nightly.  Appears fairly well controlled on her home BP log with elevated in clinic today.  Will decrease diltiazem due to bradycardia as above, may need to further adjust regimen, last to  continue to check BP twice daily at home  Hyperlipidemia: On atorvastatin 10 mg daily  RTC in ***  Medication Adjustments/Labs and Tests Ordered: Current medicines are reviewed at length with the patient today.  Concerns regarding medicines are outlined above.  No orders of the defined types were placed in this encounter.  No orders of the defined types were placed in this encounter.   There are no Patient Instructions on file for this visit.   Signed, Donato Heinz, MD  07/08/2020 3:14 PM    Paula [name]

## 2020-07-10 ENCOUNTER — Other Ambulatory Visit: Payer: Self-pay

## 2020-07-10 ENCOUNTER — Ambulatory Visit (INDEPENDENT_AMBULATORY_CARE_PROVIDER_SITE_OTHER): Payer: Medicare HMO | Admitting: Cardiology

## 2020-07-10 VITALS — BP 150/70 | HR 65 | Ht 64.0 in | Wt 140.2 lb

## 2020-07-10 DIAGNOSIS — I471 Supraventricular tachycardia: Secondary | ICD-10-CM

## 2020-07-10 DIAGNOSIS — I480 Paroxysmal atrial fibrillation: Secondary | ICD-10-CM

## 2020-07-10 DIAGNOSIS — I10 Essential (primary) hypertension: Secondary | ICD-10-CM | POA: Diagnosis not present

## 2020-07-10 DIAGNOSIS — I422 Other hypertrophic cardiomyopathy: Secondary | ICD-10-CM | POA: Diagnosis not present

## 2020-07-10 DIAGNOSIS — E785 Hyperlipidemia, unspecified: Secondary | ICD-10-CM | POA: Diagnosis not present

## 2020-07-10 MED ORDER — LOSARTAN POTASSIUM 100 MG PO TABS: 100.0000 mg | ORAL_TABLET | Freq: Every morning | ORAL | 3 refills | Status: DC

## 2020-07-10 NOTE — Patient Instructions (Addendum)
Medication Instructions:  STOP evening dose of Losartan --only taking Losartan 100 mg once in the AM  *If you need a refill on your cardiac medications before your next appointment, please call your pharmacy*  Follow-Up: At [facility], you and your health needs are our priority.  As part of our continuing mission to provide you with exceptional heart care, we have created designated Provider Care Teams.  These Care Teams include your primary Cardiologist (physician) and Advanced Practice Providers (APPs -  Physician Assistants and Nurse Practitioners) who all work together to provide you with the care you need, when you need it.  We recommend signing up for the patient portal called "MyChart".  Sign up information is provided on this After Visit Summary.  MyChart is used to connect with patients for Virtual Visits (Telemedicine).  Patients are able to view lab/test results, encounter notes, upcoming appointments, etc.  Non-urgent messages can be sent to your provider as well.   To learn more about what you can do with MyChart, go to [email or web address].    Your next appointment:   2-3 weeks with pharmacy 6 months with Dr. Gardiner Rhyme  Other Instructions Omron upper arm cuff reocmmended

## 2020-07-10 NOTE — Progress Notes (Signed)
Cardiology Office Note:    Date:  07/10/2020   ID:  Paula [name], DOB October 14, 1935, MRN [id number]  PCP:  Paula Manes, MD  Cardiologist:  None  Electrophysiologist:  None   Referring MD: Paula Manes, MD   Chief Complaint  Patient presents with   Cardiomyopathy     History of Present Illness:    Paula [name] is a 85 y.o. female with a hx of paroxysmal atrial fibrillation, hypertension, hypothyroidism who presents for follow-up.  She was referred by Paula So, PA for evaluation of hypertrophic cardiomyopathy, initially seen on 06/14/2019.  Diagnosed with atrial fibrillation after presenting with palpitations to PCP office in April. EKG showed A. fib with RVR. Started on Eliquis given CHA2DS2-VASc score 5.  Also started on diltiazem for rate control. She was seen in AF clinic on 05/11/2019, she was noted to be in normal sinus rhythm. She was continued on Eliquis 5 mg twice daily and diltiazem 180 mg daily. TTE on 05/27/19 was concerning for apical variant hypertrophic cardiomyopathy, EF 60 to 65%, normal RV function, moderate elevation in RVSP (48 mmHg), mild mitral regurgitation, mild to moderate tricuspid regurgitation.  Cardiac MRI on 07/05/2019 showed LV apical hypertrophy measuring up to 15 mm, consistent with apical hypertrophic cardiomyopathy, small apical aneurysm, patchy apical LGE accounting for 1% of total myocardial mass, hyperdynamic LV systolic function, normal RV function, mild MR (regurgitant fraction 24%).  Zio patch x7 days on 07/14/2019 showed AF burden 47%, with episode lasting 3 days and average rate 118 bpm, no NSVT, frequent episodes of SVT, longest lasting 19 seconds.  Since last clinic visit, her dizzy spells have improved significantly. However, she still does have these episodes of lightheadedness occasionally that require her to sit and rest. She has not had any syncopal episodes. Overall, she is feeling better. At home, her blood pressure has  been fluctuating. On 6/14, her blood pressure was 113/59 in the morning, 133/63 in the afternoon and then 96/49 two hours later. Typically, her blood pressure is ranging in the 90-130s/40-60s per her bp log. She believes her blood pressure machine may not be accurate recently. She denies any chest pain, shortness of breath, palpitations, or exertional symptoms. No headaches, orthopnea or PND.   Past Medical History:  Diagnosis Date   Irregular heart rate    Thyroid disease     No past surgical history on file.  Current Medications: Current Meds  Medication Sig   alendronate (FOSAMAX) 70 MG tablet    amiodarone (PACERONE) 100 MG tablet Take 1 tablet (100 mg total) by mouth daily.   atorvastatin (LIPITOR) 10 MG tablet Take 10 mg by mouth daily at 6 PM.   calcium carbonate (OS-CAL - DOSED IN MG OF ELEMENTAL CALCIUM) 1250 (500 Ca) MG tablet Take 1 tablet by mouth daily with breakfast.   diltiazem (CARDIZEM CD) 300 MG 24 hr capsule Take 1 capsule (300 mg total) by mouth daily.   ELIQUIS 5 MG TABS tablet Take 5 mg by mouth in the morning and at bedtime.   famotidine (PEPCID) 10 MG tablet Take 10 mg by mouth at bedtime.   levothyroxine (SYNTHROID, LEVOTHROID) 88 MCG tablet Take 88 mcg by mouth daily before breakfast.   Multiple Vitamin (MULTIVITAMIN WITH MINERALS) TABS tablet Take 1 tablet by mouth daily.   prazosin (MINIPRESS) 1 MG capsule Take 1 capsule (1 mg total) by mouth at bedtime.   sodium chloride (OCEAN) 0.65 % SOLN nasal spray Place 1 spray into both nostrils as  needed for congestion.   [DISCONTINUED] losartan (COZAAR) 100 MG tablet Take 1 tablet (100 mg total) by mouth every morning AND 0.5 tablets (50 mg total) every evening.     Allergies:   Wound dressing adhesive   Social History   Socioeconomic History   Marital status: Married    Spouse name: Not on file   Number of children: Not on file   Years of education: Not on file   Highest education level: Not on file   Occupational History   Not on file  Tobacco Use   Smoking status: Never   Smokeless tobacco: Never  Substance and Sexual Activity   Alcohol use: Yes    Alcohol/week: 1.0 standard drink    Types: 1 Glasses of wine per week   Drug use: No   Sexual activity: Not on file  Other Topics Concern   Not on file  Social History Narrative   Not on file   Social Determinants of Health   Financial Resource Strain: Not on file  Food Insecurity: Not on file  Transportation Needs: Not on file  Physical Activity: Not on file  Stress: Not on file  Social Connections: Not on file     Family History: The patient's family history includes Breast cancer (age of onset: 23) in her sister.  ROS:   Please see the history of present illness.    (+) Lightheadedness All other systems reviewed and are negative.  EKGs/Labs/Other Studies Reviewed:    The following studies were reviewed today:  Monitor 05/01/2020: No significant abnormalities   Patch Wear Time:  2 days and 23 hours (2022-04-01T16:38:40-0400 to 2022-04-04T16:10:58-0400)   Patient had a min HR of 35 bpm, max HR of 119 bpm, and avg HR of 61 bpm. Predominant underlying rhythm was Sinus Rhythm. 2 Supraventricular Tachycardia runs occurred, the run with the fastest interval lasting 4 beats with a max rate of 119 bpm, the longest lasting 4 beats with an avg rate of 98 bpm. Isolated SVEs were rare (<1.0%), SVE Couplets were rare (<1.0%), and SVE Triplets were rare (<1.0%). Isolated VEs were rare (<1.0%), and no VE Couplets or VE Triplets were present.  No patient triggered events  Monitor 07/14/2019: Episode of atrial fibrillation lasting 3 days 6 hours, average rate 118 bpm. AF burden 47%. 141 episodes of SVT, longest lasting 18.5 seconds.   7 days of data recorded on Zio monitor. Patient had a min HR of 55 bpm, max HR of 207 bpm, and avg HR of 95 bpm. Predominant underlying rhythm was Sinus Rhythm. No VT, high degree block, or pauses  noted.  141 episodes of SVT, longest lasting 18.5 seconds.  Episode of atrial fibrillation lasting 3 days 6 hours, average rate 118 bpm.  AF burden 47%.   Isolated atrial and ventricular ectopy was rare (<1%). There were 0 triggered events.  CMR 07/05/2019: 1. LV hypertrophy at apex measuring up to 65mm, consistent with apical hypertrophic cardiomyopathy   2.  Small apical aneurysm   3. Patchy LGE at apex, consistent with apical HCM. LGE accounts for 1% of total myocardial mass   4.  Normal LV size with hyperdynamic systolic function (EF 37%)   5.  Normal RV size and systolic function (EF 90%)   6. Mild mitral regurgitation (regurgitant volume 16cc, regurgitant fraction 24%)  Echo 05/27/2019: 1. Apical variant hypertrophic cardiomyopathy with no evidence of apical  thrombus. Left ventricular ejection fraction, by estimation, is 60 to 65%.  The left ventricle has  normal function. The left ventricle demonstrates  regional wall motion  abnormalities (see scoring diagram/findings for description). There is  severe left ventricular hypertrophy of the apical segment. Left  ventricular diastolic parameters are consistent with Grade I diastolic  dysfunction (impaired relaxation).   2. Right ventricular systolic function is normal. The right ventricular  size is normal. There is moderately elevated pulmonary artery systolic  pressure. The estimated right ventricular systolic pressure is 02.7 mmHg.   3. Left atrial size was mildly dilated.   4. The mitral valve is normal in structure. Mild mitral valve  regurgitation. No evidence of mitral stenosis.   5. Tricuspid valve regurgitation is mild to moderate.   6. Possible fusion of right and non coronary cusp. The aortic valve is  normal in structure. Aortic valve regurgitation is not visualized. Mild to  moderate aortic valve sclerosis/calcification is present, without any  evidence of aortic stenosis.   7. The inferior vena cava is normal in  size with greater than 50%  respiratory variability, suggesting right atrial pressure of 3 mmHg.  Exercise Myoview 07/12/2016: Nuclear stress EF: 64%. Blood pressure demonstrated a normal response to exercise. Horizontal ST segment depression ST segment depression was noted during stress in the III, aVF, V4, V5 and V6 leads, and returning to baseline after 1-5 minutes of recovery. Findings are nonspecific given baseline T wave inversions. The study is normal. This is a low risk study. The left ventricular ejection fraction is normal (55-65%).   EKG:   07/10/2020: EKG is not ordered today. 04/14/2020: sinus rhythm, rate 74, LVH with repolarization abnormalities  Recent Labs: 09/21/2019: ALT 25; TSH 4.624 02/03/2020: BUN 11; Creatinine, Ser 1.27; Potassium 4.9; Sodium 135  Recent Lipid Panel No results found for: CHOL, TRIG, HDL, CHOLHDL, VLDL, LDLCALC, LDLDIRECT  Physical Exam:    VS:  BP (!) 150/70   Pulse 65   Ht 5\' 4"  (1.626 m)   Wt 140 lb 3.2 oz (63.6 kg)   SpO2 98%   BMI 24.07 kg/m     Wt Readings from Last 3 Encounters:  07/10/20 140 lb 3.2 oz (63.6 kg)  04/14/20 142 lb 12.8 oz (64.8 kg)  02/14/20 140 lb 9.6 oz (63.8 kg)     GEN: in no acute distress HEENT: Normal NECK: No JVD; No carotid bruits CARDIAC: RRR, 2/6 systolic murmur RESPIRATORY:  Clear to auscultation without rales, wheezing or rhonchi  ABDOMEN: Soft, non-tender, non-distended MUSCULOSKELETAL:  No edema; No deformity  SKIN: Warm and dry NEUROLOGIC:  Alert and oriented x 3 PSYCHIATRIC:  Normal affect   ASSESSMENT:    1. Hypertrophic cardiomyopathy (HCC)   2. Paroxysmal atrial fibrillation ([place])   3. SVT (supraventricular tachycardia) ([place])   4. Essential hypertension   5. Hyperlipidemia, unspecified hyperlipidemia type     PLAN:    Hypertrophic cardiomyopathy: cardiac MRI on 07/05/2019 showed LV apical hypertrophy measuring up to 15 mm, consistent with apical hypertrophic cardiomyopathy, small  apical aneurysm, patchy apical LGE accounting for 1% of total myocardial mass.  No NSVT on Zio patch x 7 days -Would recommend her son be screened for HCM, reports he has an appointment for an echocardiogram  Paroxysmal atrial fibrillation: Diagnosed 04/2019 after presenting to PCPs office with palpitations. CHA2DS2-VASc score 5.  Zio patch x7 days on 07/14/2019 showed AF burden 47%, with episode lasting 3 days and average rate 118 bpm. -Continue Eliquis 5 mg twice daily -Follows with AF clinic, started on amiodarone 200 mg daily -Amiodarone dose reduced to  100 mg daily and diltiazem dose decreased to 300 mg daily given lightheadedness/bradycardia.  Reports symptoms have significantly improved. -Also reported having coughing/wheezing, PFT showed normal lung function and chest x-ray unremarkable  SVT: Frequent episodes on recent ZIO monitoring, longest lasting 19 seconds.  Continue diltiazem  Hypertension: Follows in hypertension clinic, on losartan 100 mg every morning/50 mg nightly, diltiazem 300 mg daily, and prazosin 1 mg nightly.  BP mildly elevated in clinic today, but she reports BP as low as 90/48 when she checks at home, and has been symptomatic with lightheadedness.  Recommend stopping evening dose of losartan and continuing 100 mg daily.  Will schedule follow-up in hypertension clinic in 2 weeks.  She is planning to get a new BP monitor, asked to bring to the appointment to calibrate.  Asked to continue to check BP twice daily over the next 2 weeks.  Hyperlipidemia: On atorvastatin 10 mg daily   RTC in 6 months   Medication Adjustments/Labs and Tests Ordered: Current medicines are reviewed at length with the patient today.  Concerns regarding medicines are outlined above.  No orders of the defined types were placed in this encounter.  Meds ordered this encounter  Medications   losartan (COZAAR) 100 MG tablet    Sig: Take 1 tablet (100 mg total) by mouth every morning.    Dispense:   90 tablet    Refill:  3    Stop PM dose     There are no Patient Instructions on file for this visit.   I,Mathew Stumpf,acting as a Education administrator for Donato Heinz, MD.,have documented all relevant documentation on the behalf of Donato Heinz, MD,as directed by  Donato Heinz, MD while in the presence of Donato Heinz, MD.   I, Donato Heinz, MD, have reviewed all documentation for this visit. The documentation on 07/10/20 for the exam, diagnosis, procedures, and orders are all accurate and complete.   Signed, Donato Heinz, MD  07/10/2020 2:39 PM    [place]

## 2020-07-15 ENCOUNTER — Other Ambulatory Visit: Payer: Self-pay

## 2020-07-15 ENCOUNTER — Encounter (HOSPITAL_COMMUNITY): Payer: Self-pay | Admitting: Emergency Medicine

## 2020-07-15 ENCOUNTER — Emergency Department (HOSPITAL_COMMUNITY): Payer: Medicare HMO

## 2020-07-15 ENCOUNTER — Inpatient Hospital Stay (HOSPITAL_COMMUNITY)
Admission: EM | Admit: 2020-07-15 | Discharge: 2020-07-18 | DRG: 243 | Disposition: A | Discharge status: Home / Self Care | Payer: Medicare HMO | Attending: Cardiology | Admitting: Cardiology

## 2020-07-15 DIAGNOSIS — Z7901 Long term (current) use of anticoagulants: Secondary | ICD-10-CM

## 2020-07-15 DIAGNOSIS — Z950 Presence of cardiac pacemaker: Secondary | ICD-10-CM

## 2020-07-15 DIAGNOSIS — I422 Other hypertrophic cardiomyopathy: Secondary | ICD-10-CM | POA: Diagnosis present

## 2020-07-15 DIAGNOSIS — I4891 Unspecified atrial fibrillation: Secondary | ICD-10-CM | POA: Diagnosis not present

## 2020-07-15 DIAGNOSIS — Z91048 Other nonmedicinal substance allergy status: Secondary | ICD-10-CM

## 2020-07-15 DIAGNOSIS — R55 Syncope and collapse: Secondary | ICD-10-CM | POA: Diagnosis not present

## 2020-07-15 DIAGNOSIS — Z7983 Long term (current) use of bisphosphonates: Secondary | ICD-10-CM

## 2020-07-15 DIAGNOSIS — R001 Bradycardia, unspecified: Secondary | ICD-10-CM | POA: Diagnosis present

## 2020-07-15 DIAGNOSIS — Z20822 Contact with and (suspected) exposure to covid-19: Secondary | ICD-10-CM | POA: Diagnosis present

## 2020-07-15 DIAGNOSIS — R531 Weakness: Secondary | ICD-10-CM | POA: Diagnosis not present

## 2020-07-15 DIAGNOSIS — I10 Essential (primary) hypertension: Secondary | ICD-10-CM | POA: Diagnosis not present

## 2020-07-15 DIAGNOSIS — Z79899 Other long term (current) drug therapy: Secondary | ICD-10-CM | POA: Diagnosis not present

## 2020-07-15 DIAGNOSIS — I4819 Other persistent atrial fibrillation: Secondary | ICD-10-CM | POA: Diagnosis present

## 2020-07-15 DIAGNOSIS — E039 Hypothyroidism, unspecified: Secondary | ICD-10-CM | POA: Diagnosis present

## 2020-07-15 DIAGNOSIS — J90 Pleural effusion, not elsewhere classified: Secondary | ICD-10-CM | POA: Diagnosis not present

## 2020-07-15 DIAGNOSIS — R519 Headache, unspecified: Secondary | ICD-10-CM | POA: Diagnosis not present

## 2020-07-15 DIAGNOSIS — I495 Sick sinus syndrome: Secondary | ICD-10-CM | POA: Diagnosis not present

## 2020-07-15 DIAGNOSIS — R112 Nausea with vomiting, unspecified: Secondary | ICD-10-CM | POA: Diagnosis not present

## 2020-07-15 DIAGNOSIS — I517 Cardiomegaly: Secondary | ICD-10-CM | POA: Diagnosis not present

## 2020-07-15 DIAGNOSIS — Z79890 Hormone replacement therapy (postmenopausal): Secondary | ICD-10-CM | POA: Diagnosis not present

## 2020-07-15 HISTORY — DX: Hypothyroidism, unspecified: E03.9

## 2020-07-15 HISTORY — DX: Heart failure, unspecified: I50.9

## 2020-07-15 LAB — BASIC METABOLIC PANEL
Anion gap: 13 (ref 5–15)
BUN: 9 mg/dL (ref 8–23)
CO2: 17 mmol/L — ABNORMAL LOW (ref 22–32)
Calcium: 8.8 mg/dL — ABNORMAL LOW (ref 8.9–10.3)
Chloride: 103 mmol/L (ref 98–111)
Creatinine, Ser: 1.27 mg/dL — ABNORMAL HIGH (ref 0.44–1.00)
GFR, Estimated: 42 mL/min — ABNORMAL LOW (ref >60)
Glucose, Bld: 117 mg/dL — ABNORMAL HIGH (ref 70–99)
Potassium: 4.8 mmol/L (ref 3.5–5.1)
Sodium: 133 mmol/L — ABNORMAL LOW (ref 135–145)

## 2020-07-15 LAB — RESP PANEL BY RT-PCR (FLU A&B, COVID) ARPGX2
Influenza A by PCR: NEGATIVE
Influenza B by PCR: NEGATIVE
SARS Coronavirus 2 by RT PCR: NEGATIVE

## 2020-07-15 LAB — CBC
HCT: 41.2 % (ref 36.0–46.0)
Hemoglobin: 12.5 g/dL (ref 12.0–15.0)
MCH: 31.5 pg (ref 26.0–34.0)
MCHC: 30.3 g/dL (ref 30.0–36.0)
MCV: 103.8 fL — ABNORMAL HIGH (ref 80.0–100.0)
Platelets: 319 10*3/uL (ref 150–400)
RBC: 3.97 MIL/uL (ref 3.87–5.11)
RDW: 14.2 % (ref 11.5–15.5)
WBC: 8.1 10*3/uL (ref 4.0–10.5)
nRBC: 0 % (ref 0.0–0.2)

## 2020-07-15 LAB — HEPATIC FUNCTION PANEL
ALT: 25 U/L (ref 0–44)
AST: 33 U/L (ref 15–41)
Albumin: 3.1 g/dL — ABNORMAL LOW (ref 3.5–5.0)
Alkaline Phosphatase: 103 U/L (ref 38–126)
Bilirubin, Direct: 0.3 mg/dL — ABNORMAL HIGH (ref 0.0–0.2)
Indirect Bilirubin: 0.4 mg/dL (ref 0.3–0.9)
Total Bilirubin: 0.7 mg/dL (ref 0.3–1.2)
Total Protein: 6.7 g/dL (ref 6.5–8.1)

## 2020-07-15 LAB — URINALYSIS, ROUTINE W REFLEX MICROSCOPIC
Bilirubin Urine: NEGATIVE
Glucose, UA: NEGATIVE mg/dL
Hgb urine dipstick: NEGATIVE
Ketones, ur: NEGATIVE mg/dL
Leukocytes,Ua: NEGATIVE
Nitrite: NEGATIVE
Protein, ur: NEGATIVE mg/dL
Specific Gravity, Urine: 1.009 (ref 1.005–1.030)
pH: 7 (ref 5.0–8.0)

## 2020-07-15 LAB — TROPONIN I (HIGH SENSITIVITY)
Troponin I (High Sensitivity): 7 ng/L (ref <18)
Troponin I (High Sensitivity): 8 ng/L (ref <18)

## 2020-07-15 LAB — T4, FREE: Free T4: 1.43 ng/dL — ABNORMAL HIGH (ref 0.61–1.12)

## 2020-07-15 LAB — CBG MONITORING, ED: Glucose-Capillary: 101 mg/dL — ABNORMAL HIGH (ref 70–99)

## 2020-07-15 LAB — TSH: TSH: 3.153 u[IU]/mL (ref 0.350–4.500)

## 2020-07-15 MED ORDER — CALCIUM CARBONATE 1250 (500 CA) MG PO TABS
1.0000 | ORAL_TABLET | Freq: Every day | ORAL | Status: DC
  Administered 2020-07-16 – 2020-07-18 (×3): 500 mg via ORAL
  Filled 2020-07-15 (×3): qty 1

## 2020-07-15 MED ORDER — SODIUM CHLORIDE 0.9 % IV SOLN
250.0000 mL | INTRAVENOUS | Status: DC | PRN
  Administered 2020-07-15: 250 mL via INTRAVENOUS

## 2020-07-15 MED ORDER — SODIUM CHLORIDE 0.9 % IV BOLUS
500.0000 mL | Freq: Once | INTRAVENOUS | Status: AC
  Administered 2020-07-15: 500 mL via INTRAVENOUS

## 2020-07-15 MED ORDER — ACETAMINOPHEN 325 MG PO TABS
650.0000 mg | ORAL_TABLET | ORAL | Status: DC | PRN
  Administered 2020-07-15 – 2020-07-17 (×2): 650 mg via ORAL
  Filled 2020-07-15 (×2): qty 2

## 2020-07-15 MED ORDER — SODIUM CHLORIDE 0.9% FLUSH
3.0000 mL | Freq: Once | INTRAVENOUS | Status: AC
  Administered 2020-07-15: 3 mL via INTRAVENOUS

## 2020-07-15 MED ORDER — HEPARIN (PORCINE) 25000 UT/250ML-% IV SOLN
1100.0000 [IU]/h | INTRAVENOUS | Status: AC
  Administered 2020-07-15: 750 [IU]/h via INTRAVENOUS
  Filled 2020-07-15 (×2): qty 250

## 2020-07-15 MED ORDER — ATORVASTATIN CALCIUM 10 MG PO TABS
10.0000 mg | ORAL_TABLET | Freq: Every day | ORAL | Status: DC
  Administered 2020-07-15 – 2020-07-17 (×3): 10 mg via ORAL
  Filled 2020-07-15 (×3): qty 1

## 2020-07-15 MED ORDER — NITROGLYCERIN 0.4 MG SL SUBL: 0.4000 mg | SUBLINGUAL_TABLET | SUBLINGUAL | Status: DC | PRN

## 2020-07-15 MED ORDER — PRAZOSIN HCL 1 MG PO CAPS
1.0000 mg | ORAL_CAPSULE | Freq: Every day | ORAL | Status: DC
  Administered 2020-07-15 – 2020-07-17 (×3): 1 mg via ORAL
  Filled 2020-07-15 (×3): qty 1

## 2020-07-15 MED ORDER — SALINE SPRAY 0.65 % NA SOLN
1.0000 | NASAL | Status: DC | PRN
  Filled 2020-07-15: qty 44

## 2020-07-15 MED ORDER — LEVOTHYROXINE SODIUM 88 MCG PO TABS
88.0000 ug | ORAL_TABLET | Freq: Every day | ORAL | Status: DC
  Administered 2020-07-16 – 2020-07-18 (×3): 88 ug via ORAL
  Filled 2020-07-15 (×3): qty 1

## 2020-07-15 MED ORDER — ALENDRONATE SODIUM 70 MG PO TABS: 70.0000 mg | ORAL_TABLET | ORAL | Status: DC

## 2020-07-15 MED ORDER — SODIUM CHLORIDE 0.9% FLUSH
3.0000 mL | Freq: Two times a day (BID) | INTRAVENOUS | Status: DC
  Administered 2020-07-17 – 2020-07-18 (×3): 3 mL via INTRAVENOUS

## 2020-07-15 MED ORDER — AMIODARONE HCL 200 MG PO TABS
100.0000 mg | ORAL_TABLET | Freq: Every day | ORAL | Status: DC
  Administered 2020-07-16 – 2020-07-18 (×3): 100 mg via ORAL
  Filled 2020-07-15 (×3): qty 1

## 2020-07-15 MED ORDER — SODIUM CHLORIDE 0.9% FLUSH: 3.0000 mL | INTRAVENOUS | Status: DC | PRN

## 2020-07-15 MED ORDER — LOSARTAN POTASSIUM 50 MG PO TABS
100.0000 mg | ORAL_TABLET | Freq: Every morning | ORAL | Status: DC
  Administered 2020-07-16 – 2020-07-18 (×3): 100 mg via ORAL
  Filled 2020-07-15 (×3): qty 2

## 2020-07-15 MED ORDER — FAMOTIDINE 20 MG PO TABS
10.0000 mg | ORAL_TABLET | Freq: Every day | ORAL | Status: DC
  Administered 2020-07-15 – 2020-07-17 (×3): 10 mg via ORAL
  Filled 2020-07-15 (×3): qty 1

## 2020-07-15 MED ORDER — ONDANSETRON HCL 4 MG/2ML IJ SOLN: 4.0000 mg | Freq: Four times a day (QID) | INTRAMUSCULAR | Status: DC | PRN

## 2020-07-15 MED ORDER — ADULT MULTIVITAMIN W/MINERALS CH
1.0000 | ORAL_TABLET | Freq: Every day | ORAL | Status: DC
  Administered 2020-07-16 – 2020-07-18 (×3): 1 via ORAL
  Filled 2020-07-15 (×3): qty 1

## 2020-07-15 NOTE — ED Provider Notes (Signed)
[facility] EMERGENCY DEPARTMENT Provider Note   CSN: [id number] Arrival date & time: 07/15/20  0844    History Chief Complaint  Patient presents with   Near Syncope    Paula [name] is a 85 y.o. female with past medical history significant for persistent A. fib, hypertension, chronic anticoagulation who presents for evaluation of near syncope.  Patient states she is having recurrent dizzy and near syncopal episodes.  Was seen by cardiology earlier in the week.  Discontinue one of her blood pressure medications and decreased her Cardizem dose.  Patient states she woke up feeling "okay" today.  She went to make breakfast and felt very weak, dizzy like she was going to pass out.  She stated she started sweating and had an episode of NBNB emesis.  She states she felt too weak to even speak.  States her husband did not notice a facial droop.  She denies any chest pain, shortness of breath.  No numbness or tingling.  She did not actually pass out.  States yesterday she had a "bad day" with her A. fib and her heart rate was elevated yesterday. No current HA. Described as a banding across her forehead. She denies any recent traumas or injuries.  She has not missed any doses of her anticoagulation.  She thinks she takes her Cardizem at night. Feels generally weak currently. No recent fever, chill, CP, SOB, abd pain. Denies additional aggravating or alleviating factors.  History obtained from patient and past medical records. No interpretor was used.  Cardiology>>> Dr. Gardiner Rhyme  07/10/20>> Amiodarone decreased to 100mg  daily and Cardizem decreased to 300 mg daily given recurrent bradycardia/ dizziness on 04/14/20  Per EMS patient HR in low 30's on arrival  HPI     Past Medical History:  Diagnosis Date   Irregular heart rate    Thyroid disease     Patient Active Problem List   Diagnosis Date Noted   Symptomatic bradycardia 07/15/2020   Hypertension 12/15/2019    Persistent atrial fibrillation ([place]) 08/31/2019   Paroxysmal atrial fibrillation ([place]) 05/11/2019   Secondary hypercoagulable state ([place]) 05/11/2019    History reviewed. No pertinent surgical history.   OB History   No obstetric history on file.     Family History  Problem Relation Age of Onset   Breast cancer Sister 47    Social History   Tobacco Use   Smoking status: Never   Smokeless tobacco: Never  Substance Use Topics   Alcohol use: Yes    Alcohol/week: 1.0 standard drink    Types: 1 Glasses of wine per week   Drug use: No    Home Medications Prior to Admission medications   Medication Sig Start Date End Date Taking? Authorizing Provider  alendronate (FOSAMAX) 70 MG tablet Take 70 mg by mouth once a week. Sunday 04/29/19  Yes [provider]  amiodarone (PACERONE) 100 MG tablet Take 1 tablet (100 mg total) by mouth daily. 04/14/20  Yes Donato Heinz, MD  atorvastatin (LIPITOR) 10 MG tablet Take 10 mg by mouth daily at 6 PM.   Yes [provider]  calcium carbonate (OS-CAL - DOSED IN MG OF ELEMENTAL CALCIUM) 1250 (500 Ca) MG tablet Take 1 tablet by mouth daily with breakfast.   Yes [provider]  diltiazem (CARDIZEM CD) 300 MG 24 hr capsule Take 1 capsule (300 mg total) by mouth daily. 05/08/20  Yes Donato Heinz, MD  ELIQUIS 5 MG TABS tablet Take 5 mg  by mouth in the morning and at bedtime. 05/13/19  Yes [provider]  famotidine (PEPCID) 10 MG tablet Take 10 mg by mouth at bedtime.   Yes [provider]  levothyroxine (SYNTHROID, LEVOTHROID) 88 MCG tablet Take 88 mcg by mouth daily before breakfast.   Yes [provider]  losartan (COZAAR) 100 MG tablet Take 1 tablet (100 mg total) by mouth every morning. 07/10/20  Yes Donato Heinz, MD  Multiple Vitamin (MULTIVITAMIN WITH MINERALS) TABS tablet Take 1 tablet by mouth daily.   Yes [provider]  prazosin (MINIPRESS) 1 MG capsule  Take 1 capsule (1 mg total) by mouth at bedtime. 02/24/20  Yes Donato Heinz, MD  sodium chloride (OCEAN) 0.65 % SOLN nasal spray Place 1 spray into both nostrils as needed for congestion.   Yes [provider]    Allergies    Wound dressing adhesive  Review of Systems   Review of Systems  Constitutional:  Positive for diaphoresis and fatigue.  HENT: Negative.    Respiratory: Negative.    Cardiovascular: Negative.  Negative for chest pain, palpitations and leg swelling.  Gastrointestinal:  Positive for nausea and vomiting. Negative for abdominal pain, anal bleeding, blood in stool, constipation, diarrhea and rectal pain.  Genitourinary: Negative.   Musculoskeletal: Negative.   Neurological:  Positive for dizziness, syncope (Near syncope), weakness (Generalized) and light-headedness. Negative for tremors, seizures, speech difficulty and numbness.  All other systems reviewed and are negative.  Physical Exam Updated Vital Signs BP (!) 146/74   Pulse 93   Temp 98 F (36.7 C) (Oral)   Resp 20   SpO2 93%   Physical Exam Vitals and nursing note reviewed.  Constitutional:      General: She is not in acute distress.    Appearance: She is well-developed. She is not ill-appearing, toxic-appearing or diaphoretic.  HENT:     Head: Normocephalic and atraumatic.     Nose: Nose normal.     Mouth/Throat:     Mouth: Mucous membranes are moist.  Eyes:     Pupils: Pupils are equal, round, and reactive to light.  Cardiovascular:     Rate and Rhythm: Bradycardia present. Rhythm irregular.     Pulses: Normal pulses.     Heart sounds: Normal heart sounds.     Comments: HR 50's in room, irregular, in slow Afib Pulmonary:     Effort: No respiratory distress.  Abdominal:     General: Bowel sounds are normal. There is no distension.     Palpations: Abdomen is soft.     Tenderness: There is no abdominal tenderness. There is no right CVA tenderness, left CVA tenderness or  guarding.  Musculoskeletal:        General: No swelling, tenderness, deformity or signs of injury. Normal range of motion.     Cervical back: Normal range of motion.     Right lower leg: No edema.     Left lower leg: No edema.  Skin:    General: Skin is warm and dry.     Capillary Refill: Capillary refill takes less than 2 seconds.  Neurological:     General: No focal deficit present.     Mental Status: She is alert and oriented to person, place, and time.  Psychiatric:        Mood and Affect: Mood normal.   ED Results / Procedures / Treatments   Labs (all labs ordered are listed, but only abnormal results are displayed) Labs  Reviewed  BASIC METABOLIC PANEL - Abnormal; Notable for the following components:      Result Value   Sodium 133 (*)    CO2 17 (*)    Glucose, Bld 117 (*)    Creatinine, Ser 1.27 (*)    Calcium 8.8 (*)    GFR, Estimated 42 (*)    All other components within normal limits  CBC - Abnormal; Notable for the following components:   MCV 103.8 (*)    All other components within normal limits  HEPATIC FUNCTION PANEL - Abnormal; Notable for the following components:   Albumin 3.1 (*)    Bilirubin, Direct 0.3 (*)    All other components within normal limits  CBG MONITORING, ED - Abnormal; Notable for the following components:   Glucose-Capillary 101 (*)    All other components within normal limits  RESP PANEL BY RT-PCR (FLU A&B, COVID) [id number]  URINALYSIS, ROUTINE W REFLEX MICROSCOPIC  TROPONIN I (HIGH SENSITIVITY)  TROPONIN I (HIGH SENSITIVITY)    EKG EKG Interpretation  Date/Time:  Saturday July 15 2020 08:51:31 EDT Ventricular Rate:  45 PR Interval:    QRS Duration: 80 QT Interval:  490 QTC Calculation: 423 R Axis:   46 Text Interpretation: Atrial fibrillation with slow ventricular response with a competing junctional pacemaker Septal infarct , age undetermined ST & T wave abnormality, consider lateral ischemia Abnormal ECG Since last tracing rate  slower and she is in afib. Confirmed by Isla Pence [phone]) on 07/15/2020 9:19:59 AM  Radiology DG Chest Portable 1 View  Result Date: 07/15/2020 CLINICAL DATA:  Near syncope. EXAM: PORTABLE CHEST 1 VIEW COMPARISON:  April 17, 2020. FINDINGS: EKG leads project over the chest. Cardiomediastinal contours remain enlarged. Signs of aortic atherosclerosis as before. Trachea and airways grossly unremarkable. Hilar structures are normal. Lungs are clear. No sign of pleural effusion or pneumothorax. On limited assessment no acute skeletal process IMPRESSION: No acute cardiopulmonary disease. Cardiomegaly. Electronically Signed   By: Zetta Bills M.D.   On: 07/15/2020 10:17    Procedures .Critical Care  Date/Time: 07/15/2020 3:25 PM Performed by: Nettie Elm, PA-C Authorized by: Nettie Elm, PA-C   Critical care provider statement:    Critical care time (minutes):  45   Critical care was necessary to treat or prevent imminent or life-threatening deterioration of the following conditions:  Cardiac failure   Critical care was time spent personally by me on the following activities:  Discussions with consultants, evaluation of patient's response to treatment, examination of patient, ordering and performing treatments and interventions, ordering and review of laboratory studies, ordering and review of radiographic studies, pulse oximetry, re-evaluation of patient's condition, obtaining history from patient or surrogate and review of old charts   Medications Ordered in ED Medications  sodium chloride flush (NS) 0.9 % injection 3 mL (3 mLs Intravenous Given 07/15/20 1010)  sodium chloride 0.9 % bolus 500 mL (0 mLs Intravenous Stopped 07/15/20 1132)   ED Course  I have reviewed the triage vital signs and the nursing notes.  Pertinent labs & imaging results that were available during my care of the patient were reviewed by me and considered in my medical decision making (see chart for  details).  Here for near syncopal episode.  Felt lightheaded, dizzy, diaphoretic, episode of emesis.  Has had frequent episodes of dizziness and cardiology has been titrating her rate controlling medication as well as her blood pressure medication.  She is currently anticoagulated and has not missed a dose.  She has a nonfocal neuro exam without deficits.  No associated chest pain, shortness of breath.  No clinical evidence of DVT on exam.  Her heart and lungs are clear.  Her abdomen soft, nontender.  Was bradycardic into the 30s with EMS.  On arrival here in slow A. fib in the 40s.  On my evaluation patient heart rate into the 50s in the room.  No hypotension.  Plan on labs, imaging, likely consult cardiology. Sx improved now that HR is more consistent with her baseline in the 60's.  Labs and imaging personally reviewed and interpreted:  CBC without leukocytosis BMP sodium 133, glucose 117, creatinine 1.27 similar to prior Hepatic fun wo acute abnormality Trop 7 UA negative for infection DG chest without acute abnormality EKG with slow Afib, bradycardia  Concern for tachy-bradycardia syndrome. Plan on Cardiology consult  CONSULT with Dr. Quentin Ore with Cardiology will assess at bedside. Will need admission for likely pacemaker placement.  The patient appears reasonably stabilized for admission considering the current resources, flow, and capabilities available in the ED at this time, and I doubt any other [facility] requiring further screening and/or treatment in the ED prior to admission.      MDM Rules/Calculators/A&P                           Final Clinical Impression(s) / ED Diagnoses Final diagnoses:  Near syncope  Bradycardia    Rx / DC Orders ED Discharge Orders     None        Zyquan Crotty A, PA-C 07/15/20 1534    Isla Pence, MD 07/16/20 [phone]

## 2020-07-15 NOTE — H&P (Signed)
Cardiology Admission History and Physical:   Patient ID: Yashvi Jasinski MRN: [id number]; DOB: 1935-09-08   Admission date: 07/15/2020  PCP:  Lajean Manes, MD   [facility] Providers Cardiologist:  None        Chief Complaint: Near syncope  Patient Profile:   Paula [name] is a 85 y.o. female with apical hypertrophic cardiomyopathy, paroxysmal atrial fibrillation, hypertension, hypothyroidism who is being seen 07/15/2020 for the evaluation of a near syncopal episode at home this morning.  History of Present Illness:   Paula [name] tells me that this morning she was at home and wanted going to the kitchen to cook some bacon.  She bent over to grab something out of a drawer and felt lightheaded and weak.  She could not stand up.  The symptoms lasted for several minutes.  EMS was called and found her in a slow heart rate in the 30s.  She tells me she has been working with her physicians recently on controlling her heart rates.  She tells me that when her heart rates are in the 50s and 60s she is relatively asymptomatic but when it dips into the 30s and 40s she feels lightheaded and weak.  She has not had a complete syncopal episode.  As she has been down titrating her cardiac medications she is now struggling with episodes of rapid heartbeats.  These are also symptomatic with palpitations.   Past Medical History:  Diagnosis Date   Irregular heart rate    Thyroid disease     History reviewed. No pertinent surgical history.   Medications Prior to Admission: Prior to Admission medications   Medication Sig Start Date End Date Taking? Authorizing Provider  alendronate (FOSAMAX) 70 MG tablet Take 70 mg by mouth once a week. Sunday 04/29/19  Yes [provider]  amiodarone (PACERONE) 100 MG tablet Take 1 tablet (100 mg total) by mouth daily. 04/14/20  Yes Donato Heinz, MD  atorvastatin (LIPITOR) 10 MG tablet Take 10 mg by mouth daily at 6 PM.   Yes  [provider]  calcium carbonate (OS-CAL - DOSED IN MG OF ELEMENTAL CALCIUM) 1250 (500 Ca) MG tablet Take 1 tablet by mouth daily with breakfast.   Yes [provider]  diltiazem (CARDIZEM CD) 300 MG 24 hr capsule Take 1 capsule (300 mg total) by mouth daily. 05/08/20  Yes Donato Heinz, MD  ELIQUIS 5 MG TABS tablet Take 5 mg by mouth in the morning and at bedtime. 05/13/19  Yes [provider]  famotidine (PEPCID) 10 MG tablet Take 10 mg by mouth at bedtime.   Yes [provider]  levothyroxine (SYNTHROID, LEVOTHROID) 88 MCG tablet Take 88 mcg by mouth daily before breakfast.   Yes [provider]  losartan (COZAAR) 100 MG tablet Take 1 tablet (100 mg total) by mouth every morning. 07/10/20  Yes Donato Heinz, MD  Multiple Vitamin (MULTIVITAMIN WITH MINERALS) TABS tablet Take 1 tablet by mouth daily.   Yes [provider]  prazosin (MINIPRESS) 1 MG capsule Take 1 capsule (1 mg total) by mouth at bedtime. 02/24/20  Yes Donato Heinz, MD  sodium chloride (OCEAN) 0.65 % SOLN nasal spray Place 1 spray into both nostrils as needed for congestion.   Yes [provider]     Allergies:    Allergies  Allergen Reactions   Wound Dressing Adhesive     Other reaction(s): rash    Social History:   Social History  Socioeconomic History   Marital status: Married    Spouse name: Not on file   Number of children: Not on file   Years of education: Not on file   Highest education level: Not on file  Occupational History   Not on file  Tobacco Use   Smoking status: Never   Smokeless tobacco: Never  Substance and Sexual Activity   Alcohol use: Yes    Alcohol/week: 1.0 standard drink    Types: 1 Glasses of wine per week   Drug use: No   Sexual activity: Not on file  Other Topics Concern   Not on file  Social History Narrative   Not on file   Social Determinants of Health   Financial Resource Strain: Not  on file  Food Insecurity: Not on file  Transportation Needs: Not on file  Physical Activity: Not on file  Stress: Not on file  Social Connections: Not on file  Intimate Partner Violence: Not on file    Family History:   The patient's family history includes Breast cancer (age of onset: 80) in her sister.    ROS:  Please see the history of present illness.  All other ROS reviewed and negative.     Physical Exam/Data:   Vitals:   07/15/20 1230 07/15/20 1300 07/15/20 1330 07/15/20 1400  BP: 138/69 134/86 129/85 (!) 132/95  Pulse: 68 (!) 103 (!) 122 (!) 127  Resp: 15 16 14 17   Temp:      TempSrc:      SpO2: 99% 99% 99% 98%    Intake/Output Summary (Last 24 hours) at 07/15/2020 1438 Last data filed at 07/15/2020 1132 Gross per 24 hour  Intake 500 ml  Output --  Net 500 ml   Last 3 Weights 07/10/2020 04/14/2020 02/14/2020  Weight (lbs) 140 lb 3.2 oz 142 lb 12.8 oz 140 lb 9.6 oz  Weight (kg) 63.594 kg 64.774 kg 63.776 kg     There is no height or weight on file to calculate BMI.  General:  Well nourished, well developed, in no acute distress HEENT: normal Lymph: no adenopathy Neck: no JVD Endocrine:  No thryomegaly Vascular: No carotid bruits; FA pulses 2+ bilaterally without bruits  Cardiac:  normal S1, S2; regular rhythm, bradycardic; no murmur  Lungs:  clear to auscultation bilaterally, no wheezing, rhonchi or rales  Abd: soft, nontender, no hepatomegaly  Ext: no edema Musculoskeletal:  No deformities, BUE and BLE strength normal and equal Skin: warm and dry  Neuro:  CNs 2-12 intact, no focal abnormalities noted Psych:  Normal affect    EKG:  The ECG that was done July 15, 2020 was personally reviewed and demonstrates atrial fibrillation with slow ventricular response   Relevant CV Studies:   May 01, 2020 3-day ZIO Patient had a min HR of 35 bpm, max HR of 119 bpm, and avg HR of 61 bpm. Predominant underlying rhythm was Sinus Rhythm. 2 Supraventricular  Tachycardia runs occurred, the run with the fastest interval lasting 4 beats with a max rate of 119 bpm, the longest lasting 4 beats with an avg rate of 98 bpm. Isolated SVEs were rare (<1.0%), SVE Couplets were rare (<1.0%), and SVE Triplets were rare (<1.0%). Isolated VEs were rare (<1.0%), and no VE Couplets or VE Triplets were present.  No patient triggered events    May 27, 2019 echo Apical variant hypertrophic cardiomyopathy Left ventricular function 60% Right ventricular function normal Mildly dilated left atrium Mild MR Mild to moderate TR  Possible bicuspid aortic valve      Laboratory Data:  High Sensitivity Troponin:   Recent Labs  Lab 07/15/20 0857 07/15/20 1113  TROPONINIHS 7 8      Chemistry Recent Labs  Lab 07/15/20 0857  NA 133*  K 4.8  CL 103  CO2 17*  GLUCOSE 117*  BUN 9  CREATININE 1.27*  CALCIUM 8.8*  GFRNONAA 42*  ANIONGAP 13    Recent Labs  Lab 07/15/20 0857  PROT 6.7  ALBUMIN 3.1*  AST 33  ALT 25  ALKPHOS 103  BILITOT 0.7   Hematology Recent Labs  Lab 07/15/20 0857  WBC 8.1  RBC 3.97  HGB 12.5  HCT 41.2  MCV 103.8*  MCH 31.5  MCHC 30.3  RDW 14.2  PLT 319   BNPNo results for input(s): BNP, PROBNP in the last 168 hours.  DDimer No results for input(s): DDIMER in the last 168 hours.   Radiology/Studies:  DG Chest Portable 1 View  Result Date: 07/15/2020 CLINICAL DATA:  Near syncope. EXAM: PORTABLE CHEST 1 VIEW COMPARISON:  April 17, 2020. FINDINGS: EKG leads project over the chest. Cardiomediastinal contours remain enlarged. Signs of aortic atherosclerosis as before. Trachea and airways grossly unremarkable. Hilar structures are normal. Lungs are clear. No sign of pleural effusion or pneumothorax. On limited assessment no acute skeletal process IMPRESSION: No acute cardiopulmonary disease. Cardiomegaly. Electronically Signed   By: Zetta Bills M.D.   On: 07/15/2020 10:17     Assessment and Plan:   Near syncope 2.   Atrial fibrillation 3.  Apical variant hypertrophic cardiomyopathy  Paula [name] is an 85 year old woman with tachycardia-bradycardia syndrome.  Her primary cardiologist, Dr. Gardiner Rhyme has attempted to adjust medications but the patient continues to have symptomatic bradycardia and tachycardia episodes.  At this point, I think she would benefit from a permanent pacemaker to allow better regulation of her heart rates.  We discussed the pacemaker procedure in detail during my visit with her today including the risks, expected recovery time and she wishes to proceed.  We briefly discussed the use of ICD therapy in patients with hokum.  We discussed her risk being fairly low for sudden cardiac death given very low scar burden on cardiac MRI, preserved ejection fraction and no history of frank syncope.  Also given her age, I do not think she would be a good candidate for defibrillator therapy.  We mutually decided to proceed with pacemaker implant only.  Risks, benefits, alternatives to PPM implantation were discussed in detail with the patient today. The patient understands that the risks include but are not limited to bleeding, infection, pneumothorax, perforation, tamponade, vascular damage, renal failure, MI, stroke, death, and lead dislodgement and wishes to proceed.    - NPO Sunday night - stop apixaban now, will resume 3-5 days after pacemaker implant - heparin for now for [facility], stop gtt at midnight on Sunday night - stop diltiazem - cont low dose amiodarone   #Hypothyroidism - cont synthroid   For questions or updates, please contact [facility] Please consult [email or web address] for contact info under     Signed, Vickie Epley, MD  07/15/2020 2:38 PM

## 2020-07-15 NOTE — Progress Notes (Signed)
ANTICOAGULATION CONSULT NOTE - Initial Consult  Pharmacy Consult for heparin while apixaban on hold Indication: atrial fibrillation  Allergies  Allergen Reactions   Wound Dressing Adhesive     Other reaction(s): rash    Patient Measurements:   Heparin Dosing Weight: 63kg  Vital Signs: Temp: 98 F (36.7 C) (06/25 0901) Temp Source: Oral (06/25 0901) BP: 146/74 (06/25 1500) Pulse Rate: 93 (06/25 1500)  Labs: Recent Labs    07/15/20 0857 07/15/20 1113  HGB 12.5  --   HCT 41.2  --   PLT 319  --   CREATININE 1.27*  --   TROPONINIHS 7 8    Estimated Creatinine Clearance: 29.5 mL/min (A) (by C-G formula based on SCr of 1.27 mg/dL (H)).   Medical History: Past Medical History:  Diagnosis Date   Irregular heart rate    Thyroid disease     Assessment: 85 year old female with history of afib on apixaban prior to admit, last dose taken this morning. Patient being admitted for heparin bridge in anticipation of pacemaker on 6/27. Will start IV heparin this evening, cbc appears stable and within normal limits. Will monitor and dose adjust based on aptt given recent apixban use.   Goal of Therapy:  Heparin level 0.3-0.7 units/ml aPTT 66-102 seconds Monitor platelets by anticoagulation protocol: Yes   Plan:  Start heparin infusion at 750 units/hr Check anti-Xa level and aptt in 8 hours and daily while on heparin Continue to monitor H&H and platelets  Erin Hearing PharmD., BCPS Clinical Pharmacist 07/15/2020 3:41 PM

## 2020-07-15 NOTE — ED Triage Notes (Addendum)
Pt to triage via [facility] from home.  Reports near syncopal episode this morning with generalized weakness, nausea, vomited x 1, headache, and diaphoresis.  Denies chest pain.  20g IV L FA and NS 1 liter bolus PTA.  PCP recently discontinued her nighttime Losartan.  EMS reports HR 37-38 on their arrival. EKG- Afib.

## 2020-07-16 LAB — BASIC METABOLIC PANEL
Anion gap: 6 (ref 5–15)
Anion gap: 6 (ref 5–15)
BUN: 8 mg/dL (ref 8–23)
BUN: 8 mg/dL (ref 8–23)
CO2: 24 mmol/L (ref 22–32)
CO2: 24 mmol/L (ref 22–32)
Calcium: 8.5 mg/dL — ABNORMAL LOW (ref 8.9–10.3)
Calcium: 8.5 mg/dL — ABNORMAL LOW (ref 8.9–10.3)
Chloride: 104 mmol/L (ref 98–111)
Chloride: 105 mmol/L (ref 98–111)
Creatinine, Ser: 1.01 mg/dL — ABNORMAL HIGH (ref 0.44–1.00)
Creatinine, Ser: 1.03 mg/dL — ABNORMAL HIGH (ref 0.44–1.00)
GFR, Estimated: 56 mL/min — ABNORMAL LOW (ref >60)
GFR, Estimated: 54 mL/min — ABNORMAL LOW (ref >60)
Glucose, Bld: 98 mg/dL (ref 70–99)
Glucose, Bld: 103 mg/dL — ABNORMAL HIGH (ref 70–99)
Potassium: 4.1 mmol/L (ref 3.5–5.1)
Potassium: 4.2 mmol/L (ref 3.5–5.1)
Sodium: 134 mmol/L — ABNORMAL LOW (ref 135–145)
Sodium: 135 mmol/L (ref 135–145)

## 2020-07-16 LAB — CBC
HCT: 34.3 % — ABNORMAL LOW (ref 36.0–46.0)
HCT: 35.8 % — ABNORMAL LOW (ref 36.0–46.0)
Hemoglobin: 11.2 g/dL — ABNORMAL LOW (ref 12.0–15.0)
Hemoglobin: 11.8 g/dL — ABNORMAL LOW (ref 12.0–15.0)
MCH: 31.4 pg (ref 26.0–34.0)
MCH: 31.5 pg (ref 26.0–34.0)
MCHC: 32.7 g/dL (ref 30.0–36.0)
MCHC: 33 g/dL (ref 30.0–36.0)
MCV: 96.1 fL (ref 80.0–100.0)
MCV: 95.5 fL (ref 80.0–100.0)
Platelets: 318 10*3/uL (ref 150–400)
Platelets: 304 10*3/uL (ref 150–400)
RBC: 3.57 MIL/uL — ABNORMAL LOW (ref 3.87–5.11)
RBC: 3.75 MIL/uL — ABNORMAL LOW (ref 3.87–5.11)
RDW: 14 % (ref 11.5–15.5)
RDW: 14 % (ref 11.5–15.5)
WBC: 7.3 10*3/uL (ref 4.0–10.5)
WBC: 7.4 10*3/uL (ref 4.0–10.5)
nRBC: 0 % (ref 0.0–0.2)
nRBC: 0 % (ref 0.0–0.2)

## 2020-07-16 LAB — HEPARIN LEVEL (UNFRACTIONATED)
Heparin Unfractionated: >1.1 IU/mL — ABNORMAL HIGH (ref 0.30–0.70)
Heparin Unfractionated: >1.1 IU/mL — ABNORMAL HIGH (ref 0.30–0.70)

## 2020-07-16 LAB — APTT
aPTT: 37 seconds — ABNORMAL HIGH (ref 24–36)
aPTT: 42 seconds — ABNORMAL HIGH (ref 24–36)
aPTT: 59 seconds — ABNORMAL HIGH (ref 24–36)

## 2020-07-16 LAB — SURGICAL PCR SCREEN
MRSA, PCR: NEGATIVE
Staphylococcus aureus: NEGATIVE

## 2020-07-16 MED ORDER — CEFAZOLIN SODIUM-DEXTROSE 2-4 GM/100ML-% IV SOLN
2.0000 g | INTRAVENOUS | Status: AC
  Administered 2020-07-17: 2 g via INTRAVENOUS

## 2020-07-16 MED ORDER — SODIUM CHLORIDE 0.9 % IV SOLN
80.0000 mg | INTRAVENOUS | Status: AC
  Administered 2020-07-17: 80 mg

## 2020-07-16 MED ORDER — SODIUM CHLORIDE 0.9 % IV SOLN: INTRAVENOUS | Status: DC

## 2020-07-16 MED ORDER — SODIUM CHLORIDE 0.45 % IV SOLN: INTRAVENOUS | Status: DC

## 2020-07-16 MED ORDER — HYDRALAZINE HCL 25 MG PO TABS
25.0000 mg | ORAL_TABLET | Freq: Three times a day (TID) | ORAL | Status: DC | PRN
  Administered 2020-07-16: 25 mg via ORAL
  Filled 2020-07-16: qty 1

## 2020-07-16 NOTE — Progress Notes (Signed)
   07/16/20 0734  Assess: MEWS Score  Temp (!) 97.5 F (36.4 C)  BP 116/60  Pulse Rate (!) 114  ECG Heart Rate (!) 114  Resp 20  Assess: MEWS Score  MEWS Temp 0  MEWS Systolic 0  MEWS Pulse 2  MEWS RR 0  MEWS LOC 0  MEWS Score 2  MEWS Score Color Yellow  Assess: if the MEWS score is Yellow or Red  Were vital signs taken at a resting state? Yes  Focused Assessment No change from prior assessment  Early Detection of Sepsis Score *See Row Information* Low  MEWS guidelines implemented *See Row Information* Yes  Take Vital Signs  Increase Vital Sign Frequency  Yellow: Q 2hr X 2 then Q 4hr X 2, if remains yellow, continue Q 4hrs  Escalate  MEWS: Escalate Yellow: discuss with charge nurse/RN and consider discussing with provider and RRT  Notify: Charge Nurse/RN  Name of Charge Nurse/RN Notified Aerica Rincon rn  Date Charge Nurse/RN Notified 07/16/20  Time Charge Nurse/RN Notified 0734  Notify: Provider  Provider Name/Title dr. Quentin Ore (came to bedside for AM rounding and assessment)  Date Provider Notified 07/16/20  Time Provider Notified 0820  Notification Type Rounds  Notification Reason Other (Comment)  Provider response Other (Comment) (PPM scheduled for 6/27)  Date of Provider Response 07/16/20  Time of Provider Response 0820  Document  Patient Outcome Not stable and remains on department  Progress note created (see row info) Yes

## 2020-07-16 NOTE — Progress Notes (Signed)
Progress Note  Patient Name: Paula [name] Date of Encounter: 07/16/2020  [facility] Cardiologist: None   Subjective   NAEO. Hrs now in the 130s (AF) this AM.   Inpatient Medications    Scheduled Meds:  amiodarone  100 mg Oral Daily   atorvastatin  10 mg Oral q1800   calcium carbonate  1 tablet Oral Q breakfast   famotidine  10 mg Oral QHS   levothyroxine  88 mcg Oral QAC breakfast   losartan  100 mg Oral q morning   multivitamin with minerals  1 tablet Oral Daily   prazosin  1 mg Oral QHS   sodium chloride flush  3 mL Intravenous Q12H   Continuous Infusions:  sodium chloride 250 mL (07/15/20 1812)   heparin 950 Units/hr (07/16/20 0416)   PRN Meds: sodium chloride, acetaminophen, nitroGLYCERIN, ondansetron (ZOFRAN) IV, sodium chloride, sodium chloride flush   Vital Signs    Vitals:   07/16/20 0105 07/16/20 0107 07/16/20 0343 07/16/20 0734  BP: 124/80 124/80 (!) 135/97 116/60  Pulse:   (!) 102 (!) 114  Resp: 18 18 17 20   Temp: 98.2 F (36.8 C) 98.2 F (36.8 C) 98.6 F (37 C) (!) 97.5 F (36.4 C)  TempSrc: Oral Oral Oral Axillary  SpO2: 99% 99% 100%   Weight:   63 kg     Intake/Output Summary (Last 24 hours) at 07/16/2020 0825 Last data filed at 07/16/2020 0600 Gross per 24 hour  Intake 980.13 ml  Output --  Net 980.13 ml   Last 3 Weights 07/16/2020 07/10/2020 04/14/2020  Weight (lbs) 139 lb 140 lb 3.2 oz 142 lb 12.8 oz  Weight (kg) 63.05 kg 63.594 kg 64.774 kg      Telemetry    AF with rapid ventricular rates - Personally Reviewed  ECG    No new - Personally Reviewed  Physical Exam   GEN: No acute distress.   Neck: No JVD Cardiac: irregularly irregular, no murmurs, rubs, or gallops.  Respiratory: Clear to auscultation bilaterally. GI: Soft, nontender, non-distended  MS: No edema; No deformity. Neuro:  Nonfocal  Psych: Normal affect   Labs    High Sensitivity Troponin:   Recent Labs  Lab 07/15/20 0857 07/15/20 1113   TROPONINIHS 7 8      Chemistry Recent Labs  Lab 07/15/20 0857 07/16/20 0203 07/16/20 0304  NA 133* 134* 135  K 4.8 4.1 4.2  CL 103 104 105  CO2 17* 24 24  GLUCOSE 117* 98 103*  BUN 9 8 8   CREATININE 1.27* 1.01* 1.03*  CALCIUM 8.8* 8.5* 8.5*  PROT 6.7  --   --   ALBUMIN 3.1*  --   --   AST 33  --   --   ALT 25  --   --   ALKPHOS 103  --   --   BILITOT 0.7  --   --   GFRNONAA 42* 56* 54*  ANIONGAP 13 6 6      Hematology Recent Labs  Lab 07/15/20 0857 07/16/20 0203 07/16/20 0304  WBC 8.1 7.3 7.4  RBC 3.97 3.57* 3.75*  HGB 12.5 11.2* 11.8*  HCT 41.2 34.3* 35.8*  MCV 103.8* 96.1 95.5  MCH 31.5 31.4 31.5  MCHC 30.3 32.7 33.0  RDW 14.2 14.0 14.0  PLT 319 318 304    BNPNo results for input(s): BNP, PROBNP in the last 168 hours.   DDimer No results for input(s): DDIMER in the last 168 hours.   Radiology  DG Chest Portable 1 View  Result Date: 07/15/2020 CLINICAL DATA:  Near syncope. EXAM: PORTABLE CHEST 1 VIEW COMPARISON:  April 17, 2020. FINDINGS: EKG leads project over the chest. Cardiomediastinal contours remain enlarged. Signs of aortic atherosclerosis as before. Trachea and airways grossly unremarkable. Hilar structures are normal. Lungs are clear. No sign of pleural effusion or pneumothorax. On limited assessment no acute skeletal process IMPRESSION: No acute cardiopulmonary disease. Cardiomegaly. Electronically Signed   By: Zetta Bills M.D.   On: 07/15/2020 10:17    Cardiac Studies   No new   05/27/2019 Echo - EF 60%   Assessment & Plan    Tachy-brady syndrome Will require pacemaker to help regulate heart rates. No ICD. - NPO tonight - stop heparin gtt tonight - plan to restart dilt after PPM implanted - cont low dose amiodarone  Risks, benefits, alternatives to PPM implantation were discussed in detail with the patient today. The patient understands that the risks include but are not limited to bleeding, infection, pneumothorax, perforation,  tamponade, vascular damage, renal failure, MI, stroke, death, and lead dislodgement and wishes to proceed.   2. Hypothyroid - cont synthroid  For questions or updates, please contact [place] Please consult [email or web address] for contact info under        Signed, Vickie Epley, MD  07/16/2020, 8:25 AM

## 2020-07-16 NOTE — Progress Notes (Signed)
ANTICOAGULATION CONSULT NOTE - Follow Up Consult  Pharmacy Consult for heparin Indication: atrial fibrillation  Labs: Recent Labs    07/15/20 0857 07/15/20 1113 07/16/20 0203 07/16/20 0304 07/16/20 1101  HGB 12.5  --  11.2* 11.8*  --   HCT 41.2  --  34.3* 35.8*  --   PLT 319  --  318 304  --   APTT  --   --  37* 42* 59*  HEPARINUNFRC  --   --  >1.10* >1.10*  --   CREATININE 1.27*  --  1.01* 1.03*  --   TROPONINIHS 7 8  --   --   --      Assessment: 85yo female admitted with tachy-brady syndrome. On Eliquis PTA for AFib, last dose 6/25 @0600 . Eliquis on hold for pacemaker placement on 6/27. Pharmacy consulted to dose heparin for bridging.  aPTT subtherapeutic at 59. No issue with bleeding per RN.   Goal of Therapy:  aPTT 66-102 seconds   Plan:  Will increase heparin gtt to 1100 units/hr Heparin infusion to stop at midnight F/u resuming Eliquis post-op    Berenice Bouton, PharmD, MS, BCPS PGY2 Pharmacy Resident  Please check AMION for all [place] phone numbers After 10:00 PM, call [place] [phone]  07/16/2020,12:51 PM

## 2020-07-16 NOTE — Progress Notes (Signed)
BP checked at 1711=148/123 right arm Recheck =133/103 BP checked at 1718=158/106 left arm  Richardson Dopp, PA notified.  PRN hydralazine ordered.

## 2020-07-16 NOTE — Progress Notes (Signed)
ANTICOAGULATION CONSULT NOTE - Follow Up Consult  Pharmacy Consult for heparin Indication: atrial fibrillation   Labs: Recent Labs    07/15/20 0857 07/15/20 1113 07/16/20 0203 07/16/20 0304  HGB 12.5  --  11.2* 11.8*  HCT 41.2  --  34.3* 35.8*  PLT 319  --  318 304  APTT  --   --  37* 42*  HEPARINUNFRC  --   --  >1.10* >1.10*  CREATININE 1.27*  --  1.01* 1.03*  TROPONINIHS 7 8  --   --     Assessment: 85yo female subtherapeutic on heparin with initial dosing while Eliquis on hold; no gtt issues or signs of bleeding per RN.  Goal of Therapy:  aPTT 66-102 seconds   Plan:  Will increase heparin gtt by 3-4 units/kg/hr to 950 units/hr and check PTT in 8 hours.    Wynona Neat, PharmD, BCPS  07/16/2020,4:08 AM

## 2020-07-17 ENCOUNTER — Encounter (HOSPITAL_COMMUNITY)
Admission: EM | Disposition: A | Discharge status: Home / Self Care | Payer: Self-pay | Source: Home / Self Care | Attending: Cardiology

## 2020-07-17 HISTORY — PX: PACEMAKER IMPLANT: EP1218

## 2020-07-17 LAB — BASIC METABOLIC PANEL
Anion gap: 6 (ref 5–15)
BUN: 8 mg/dL (ref 8–23)
CO2: 23 mmol/L (ref 22–32)
Calcium: 8.3 mg/dL — ABNORMAL LOW (ref 8.9–10.3)
Chloride: 105 mmol/L (ref 98–111)
Creatinine, Ser: 1.08 mg/dL — ABNORMAL HIGH (ref 0.44–1.00)
GFR, Estimated: 51 mL/min — ABNORMAL LOW (ref >60)
Glucose, Bld: 96 mg/dL (ref 70–99)
Potassium: 4.1 mmol/L (ref 3.5–5.1)
Sodium: 134 mmol/L — ABNORMAL LOW (ref 135–145)

## 2020-07-17 LAB — CBC
HCT: 34.5 % — ABNORMAL LOW (ref 36.0–46.0)
Hemoglobin: 11.4 g/dL — ABNORMAL LOW (ref 12.0–15.0)
MCH: 32.1 pg (ref 26.0–34.0)
MCHC: 33 g/dL (ref 30.0–36.0)
MCV: 97.2 fL (ref 80.0–100.0)
Platelets: 265 10*3/uL (ref 150–400)
RBC: 3.55 MIL/uL — ABNORMAL LOW (ref 3.87–5.11)
RDW: 14.1 % (ref 11.5–15.5)
WBC: 6.9 10*3/uL (ref 4.0–10.5)
nRBC: 0 % (ref 0.0–0.2)

## 2020-07-17 SURGERY — PACEMAKER IMPLANT
Anesthesia: LOCAL

## 2020-07-17 MED ORDER — ACETAMINOPHEN 325 MG PO TABS: 325.0000 mg | ORAL_TABLET | ORAL | Status: DC | PRN

## 2020-07-17 MED ORDER — ONDANSETRON HCL 4 MG/2ML IJ SOLN: 4.0000 mg | Freq: Four times a day (QID) | INTRAMUSCULAR | Status: DC | PRN

## 2020-07-17 MED ORDER — FENTANYL CITRATE (PF) 100 MCG/2ML IJ SOLN
INTRAMUSCULAR | Status: DC | PRN
  Administered 2020-07-17: 12.5 ug via INTRAVENOUS

## 2020-07-17 MED ORDER — SODIUM CHLORIDE 0.9 % IV SOLN
INTRAVENOUS | Status: AC
  Filled 2020-07-17: qty 2

## 2020-07-17 MED ORDER — LIDOCAINE HCL (PF) 1 % IJ SOLN
INTRAMUSCULAR | Status: AC
  Filled 2020-07-17: qty 60

## 2020-07-17 MED ORDER — LIDOCAINE HCL (PF) 1 % IJ SOLN
INTRAMUSCULAR | Status: DC | PRN
  Administered 2020-07-17: 60 mL

## 2020-07-17 MED ORDER — HEPARIN (PORCINE) IN NACL 1000-0.9 UT/500ML-% IV SOLN
INTRAVENOUS | Status: DC | PRN
  Administered 2020-07-17: 500 mL

## 2020-07-17 MED ORDER — FENTANYL CITRATE (PF) 100 MCG/2ML IJ SOLN
INTRAMUSCULAR | Status: AC
  Filled 2020-07-17: qty 2

## 2020-07-17 MED ORDER — CEFAZOLIN SODIUM-DEXTROSE 1-4 GM/50ML-% IV SOLN
1.0000 g | Freq: Four times a day (QID) | INTRAVENOUS | Status: AC
  Administered 2020-07-17 – 2020-07-18 (×3): 1 g via INTRAVENOUS
  Filled 2020-07-17 (×4): qty 50

## 2020-07-17 MED ORDER — MIDAZOLAM HCL 5 MG/5ML IJ SOLN
INTRAMUSCULAR | Status: DC | PRN
  Administered 2020-07-17: 1 mg via INTRAVENOUS

## 2020-07-17 MED ORDER — MIDAZOLAM HCL 5 MG/5ML IJ SOLN
INTRAMUSCULAR | Status: AC
  Filled 2020-07-17: qty 5

## 2020-07-17 MED ORDER — CEFAZOLIN SODIUM-DEXTROSE 2-4 GM/100ML-% IV SOLN
INTRAVENOUS | Status: AC
  Filled 2020-07-17: qty 100

## 2020-07-17 SURGICAL SUPPLY — 7 items
CABLE SURGICAL S-101-97-12 (CABLE) ×2 IMPLANT
LEAD TENDRIL MRI 46CM LPA1200M (Lead) ×1 IMPLANT
LEAD TENDRIL MRI 52CM LPA1200M (Lead) ×1 IMPLANT
PACEMAKER ASSURITY DR-RF (Pacemaker) ×1 IMPLANT
PAD PRO RADIOLUCENT 2001M-C (PAD) ×2 IMPLANT
SHEATH 8FR PRELUDE SNAP 13 (SHEATH) ×2 IMPLANT
TRAY PACEMAKER INSERTION (PACKS) ×2 IMPLANT

## 2020-07-17 NOTE — Progress Notes (Addendum)
Electrophysiology Rounding Note  Patient Name: Paula [name] Date of Encounter: 07/17/2020  Primary Cardiologist: None Electrophysiologist: Vickie Epley, MD   Subjective   The patient is doing well today.  At this time, the patient denies chest pain, shortness of breath, or any new concerns.  Inpatient Medications    Scheduled Meds:  amiodarone  100 mg Oral Daily   atorvastatin  10 mg Oral q1800   calcium carbonate  1 tablet Oral Q breakfast   famotidine  10 mg Oral QHS   gentamicin irrigation  80 mg Irrigation On Call   levothyroxine  88 mcg Oral QAC breakfast   losartan  100 mg Oral q morning   multivitamin with minerals  1 tablet Oral Daily   prazosin  1 mg Oral QHS   sodium chloride flush  3 mL Intravenous Q12H   Continuous Infusions:  sodium chloride     sodium chloride Stopped (07/17/20 0629)   sodium chloride 50 mL/hr at 07/17/20 0633    ceFAZolin (ANCEF) IV     PRN Meds: sodium chloride, acetaminophen, hydrALAZINE, nitroGLYCERIN, ondansetron (ZOFRAN) IV, sodium chloride, sodium chloride flush   Vital Signs    Vitals:   07/16/20 2345 07/16/20 2346 07/17/20 0318 07/17/20 0323  BP:  104/68  (!) 110/96  Pulse: (!) 122 (!) 122  (!) 44  Resp:  19  18  Temp: 98.3 F (36.8 C) 98.3 F (36.8 C)  97.6 F (36.4 C)  TempSrc: Oral Oral  Oral  SpO2:  99%  99%  Weight:   62.8 kg     Intake/Output Summary (Last 24 hours) at 07/17/2020 0701 Last data filed at 07/17/2020 0634 Gross per 24 hour  Intake 781.24 ml  Output --  Net 781.24 ml   Filed Weights   07/16/20 0343 07/17/20 0318  Weight: 63 kg 62.8 kg    Physical Exam    GEN- The patient is well appearing, alert and oriented x 3 today.   Head- normocephalic, atraumatic Eyes-  Sclera clear, conjunctiva pink Ears- hearing intact Oropharynx- clear Neck- supple Lungs- Clear to ausculation bilaterally, normal work of breathing Heart- Irregularly irregular rate and rhythm, no murmurs, rubs or  gallops GI- soft, NT, ND, + BS Extremities- no clubbing or cyanosis. No edema Skin- no rash or lesion Psych- euthymic mood, full affect Neuro- strength and sensation are intact  Labs    CBC Recent Labs    07/16/20 0304 07/17/20 0306  WBC 7.4 6.9  HGB 11.8* 11.4*  HCT 35.8* 34.5*  MCV 95.5 97.2  PLT 304 937   Basic Metabolic Panel Recent Labs    07/16/20 0304 07/17/20 0306  NA 135 134*  K 4.2 4.1  CL 105 105  CO2 24 23  GLUCOSE 103* 96  BUN 8 8  CREATININE 1.03* 1.08*  CALCIUM 8.5* 8.3*   Liver Function Tests Recent Labs    07/15/20 0857  AST 33  ALT 25  ALKPHOS 103  BILITOT 0.7  PROT 6.7  ALBUMIN 3.1*   No results for input(s): LIPASE, AMYLASE in the last 72 hours. Cardiac Enzymes No results for input(s): CKTOTAL, CKMB, CKMBINDEX, TROPONINI in the last 72 hours.   Telemetry    Atrial fibrillation 140-150s (personally reviewed)  Radiology    DG Chest Portable 1 View  Result Date: 07/15/2020 CLINICAL DATA:  Near syncope. EXAM: PORTABLE CHEST 1 VIEW COMPARISON:  April 17, 2020. FINDINGS: EKG leads project over the chest. Cardiomediastinal contours remain enlarged. Signs of aortic  atherosclerosis as before. Trachea and airways grossly unremarkable. Hilar structures are normal. Lungs are clear. No sign of pleural effusion or pneumothorax. On limited assessment no acute skeletal process IMPRESSION: No acute cardiopulmonary disease. Cardiomegaly. Electronically Signed   By: Zetta Bills M.D.   On: 07/15/2020 10:17    Patient Profile     Paula [name] is a 85 y.o. female with a past medical history significant for apical HCM, PAF, HTN, and hypothyroidism.  she was admitted for near syncope and found to have symptomatic bradycardia. Also noted to have AF with RVR in 150s.   Assessment & Plan    Tachy-brady syndrome Plan for PPM this afternoon with Dr. Lovena Le. Plan to restart dilt post ppm Continue low dose amiodarone Will discuss timing of [place]  resumption with Dr. Lovena Le Explained risks, benefits, and alternatives to PPM implantation, including but not limited to bleeding, infection, pneumothorax, pericardial effusion, lead dislodgement, heart attack, stroke, or death.  Pt verbalized understanding and agrees to proceed.   2. Atrial fibrillation CHA2DS2/VASc of at least 4.   Will discuss resumption of Eliquis timing with Dr. Lovena Le  3. Hypothyroid  Continue synthroid Follow on amio  4. HCM In joint decision making decided to move forward with PPM alone with low scar burden on cMRI, preserved EF, and no frank syncope.    For questions or updates, please contact [place] Please consult [email or web address] for contact info under Cardiology/STEMI.  Signed, Shirley Friar, PA-C  07/17/2020, 7:01 AM   EP Attending  Patient seen and examined. Agree with the findings as noted above. She is doing very well after PPM insertion. She is maintaining NSR. Her PPM interrogation under my direction demonstrates normal DDD PM function. Her CXR demonstrates stable lead position with no PTX. She will be discharged home with usual followup. I asked her to wait 5 days before restarting the Eliquis.   [name],MD

## 2020-07-18 ENCOUNTER — Inpatient Hospital Stay (HOSPITAL_COMMUNITY): Payer: Medicare HMO

## 2020-07-18 ENCOUNTER — Encounter (HOSPITAL_COMMUNITY): Payer: Self-pay | Admitting: Internal Medicine

## 2020-07-18 LAB — CBC
HCT: 32.9 % — ABNORMAL LOW (ref 36.0–46.0)
Hemoglobin: 10.8 g/dL — ABNORMAL LOW (ref 12.0–15.0)
MCH: 31.7 pg (ref 26.0–34.0)
MCHC: 32.8 g/dL (ref 30.0–36.0)
MCV: 96.5 fL (ref 80.0–100.0)
Platelets: 268 K/uL (ref 150–400)
RBC: 3.41 MIL/uL — ABNORMAL LOW (ref 3.87–5.11)
RDW: 14 % (ref 11.5–15.5)
WBC: 8 K/uL (ref 4.0–10.5)
nRBC: 0 % (ref 0.0–0.2)

## 2020-07-18 MED ORDER — AMIODARONE HCL 200 MG PO TABS: 200.0000 mg | ORAL_TABLET | Freq: Every day | ORAL | 3 refills | Status: DC

## 2020-07-18 MED ORDER — ACETAMINOPHEN 325 MG PO TABS: 650.0000 mg | ORAL_TABLET | ORAL | Status: DC | PRN

## 2020-07-18 MED ORDER — DILTIAZEM HCL ER COATED BEADS 180 MG PO CP24
300.0000 mg | ORAL_CAPSULE | Freq: Every day | ORAL | Status: DC
  Administered 2020-07-18: 300 mg via ORAL
  Filled 2020-07-18: qty 1

## 2020-07-18 MED ORDER — ELIQUIS 5 MG PO TABS: 5.0000 mg | ORAL_TABLET | Freq: Two times a day (BID) | ORAL | Status: DC

## 2020-07-18 NOTE — Plan of Care (Deleted)
  Problem: Pain Managment: Goal: General experience of comfort will improve Outcome: Progressing   Problem: Clinical Measurements: Goal: Ability to maintain clinical measurements within normal limits will improve Outcome: Completed/Met Goal: Diagnostic test results will improve Outcome: Completed/Met Goal: Cardiovascular complication will be avoided Outcome: Completed/Met   Problem: Nutrition: Goal: Adequate nutrition will be maintained Outcome: Completed/Met   Problem: Elimination: Goal: Will not experience complications related to urinary retention Outcome: Completed/Met

## 2020-07-18 NOTE — Plan of Care (Signed)
  Problem: Clinical Measurements: Goal: Ability to maintain clinical measurements within normal limits will improve Outcome: Completed/Met Goal: Diagnostic test results will improve Outcome: Completed/Met Goal: Cardiovascular complication will be avoided Outcome: Completed/Met   Problem: Nutrition: Goal: Adequate nutrition will be maintained Outcome: Completed/Met   Problem: Elimination: Goal: Will not experience complications related to urinary retention Outcome: Completed/Met   Problem: Cardiac: Goal: Ability to achieve and maintain adequate cardiopulmonary perfusion will improve Outcome: Completed/Met   Problem: Pain Managment: Goal: General experience of comfort will improve Outcome: Progressing

## 2020-07-18 NOTE — Discharge Summary (Addendum)
ELECTROPHYSIOLOGY PROCEDURE DISCHARGE SUMMARY    Patient ID: Paula [name],  MRN: [id number], DOB/AGE: March 24, 1935 85 y.o.  Admit date: 07/15/2020 Discharge date: 07/18/2020  Primary Care Physician: Lajean Manes, MD  Primary Cardiologist: None  Electrophysiologist: Vickie Epley, MD (Dr. Lovena Le implanted PPM)  Primary Discharge Diagnosis:  Symptomatic bradycardia status post pacemaker implantation this admission  Secondary Discharge Diagnosis:  Atrial fibrillation Tachy-brady syndrome Hypothyroid  Allergies  Allergen Reactions   Wound Dressing Adhesive     Other reaction(s): rash     Procedures This Admission:  1.  Implantation of a St. Jude dual chamber PPM on 07/17/2020 by Dr. Lovena Le. The patient received a St. Jude model number [id number] with model number [id number] right atrial lead and [id number] right ventricular lead. There were no immediate post procedure complications. 2.  CXR on 07/18/20 demonstrated no pneumothorax status post device implantation.   Brief HPI: Paula [name] is a 85 y.o. female was admitted for symptomatic bradycardia on medications for AF RVR and electrophysiology team asked to see for consideration of PPM implantation.  Past medical history includes above.  The patient has had symptomatic bradycardia without reversible causes identified.  Risks, benefits, and alternatives to PPM implantation were reviewed with the patient who wished to proceed.   Hospital Course:  The patient was admitted as above and underwent implantation of a St. Jude dual chamber PPM with details as outlined above.  She was monitored on telemetry overnight which demonstrated NSR after converting on amiodarone at approx 1100 on 07/17/2020.  Left chest was without hematoma or ecchymosis.  The device was interrogated and found to be functioning normally.  CXR was obtained and demonstrated no pneumothorax status post device implantation.  Wound care, arm mobility,  and restrictions were reviewed with the patient.  The patient was examined and considered stable for discharge to home.    Regarding blood thinner therapy, they were instructed to resume their Eliquis (medication name) on Sunday, 07/23/2020 with the morning dose. (date/time).   Physical Exam: Vitals:   07/18/20 0059 07/18/20 0415 07/18/20 0718 07/18/20 0757  BP: (!) 148/66 138/78  (!) 147/65  Pulse: 75 70  77  Resp: 15 15  16   Temp: 97.8 F (36.6 C) 97.8 F (36.6 C)  98.2 F (36.8 C)  TempSrc: Tympanic Oral  Oral  SpO2: 98% 99%  100%  Weight:   63.9 kg   Height:        GEN- The patient is well appearing, alert and oriented x 3 today.   HEENT: normocephalic, atraumatic; sclera clear, conjunctiva pink; hearing intact; oropharynx clear; neck supple, no JVP Lymph- no cervical lymphadenopathy Lungs- Clear to ausculation bilaterally, normal work of breathing.  No wheezes, rales, rhonchi Heart- Regular rate and rhythm, no murmurs, rubs or gallops, PMI not laterally displaced GI- soft, non-tender, non-distended, bowel sounds present, no hepatosplenomegaly Extremities- no clubbing, cyanosis, or edema; DP/PT/radial pulses 2+ bilaterally MS- no significant deformity or atrophy Skin- warm and dry, no rash or lesion, left chest without hematoma/ecchymosis Psych- euthymic mood, full affect Neuro- strength and sensation are intact   Labs:   Lab Results  Component Value Date   WBC 8.0 07/18/2020   HGB 10.8 (L) 07/18/2020   HCT 32.9 (L) 07/18/2020   MCV 96.5 07/18/2020   PLT 268 07/18/2020    Recent Labs  Lab 07/15/20 0857 07/16/20 0203 07/17/20 0306  NA 133*   < > 134*  K 4.8   < > 4.1  CL 103   < > 105  CO2 17*   < > 23  BUN 9   < > 8  CREATININE 1.27*   < > 1.08*  CALCIUM 8.8*   < > 8.3*  PROT 6.7  --   --   BILITOT 0.7  --   --   ALKPHOS 103  --   --   ALT 25  --   --   AST 33  --   --   GLUCOSE 117*   < > 96   < > = values in this interval not displayed.    Discharge  Medications:  Allergies as of 07/18/2020       Reactions   Wound Dressing Adhesive    Other reaction(s): rash        Medication List     TAKE these medications    acetaminophen 325 MG tablet Commonly known as: TYLENOL Take 2 tablets (650 mg total) by mouth every 4 (four) hours as needed for headache or mild pain.   alendronate 70 MG tablet Commonly known as: FOSAMAX Take 70 mg by mouth once a week. Sunday   amiodarone 200 MG tablet Commonly known as: Pacerone Take 1 tablet (200 mg total) by mouth daily. What changed:  medication strength how much to take   atorvastatin 10 MG tablet Commonly known as: LIPITOR Take 10 mg by mouth daily at 6 PM.   calcium carbonate 1250 (500 Ca) MG tablet Commonly known as: OS-CAL - dosed in mg of elemental calcium Take 1 tablet by mouth daily with breakfast.   diltiazem 300 MG 24 hr capsule Commonly known as: CARDIZEM CD Take 1 capsule (300 mg total) by mouth daily.   Eliquis 5 MG Tabs tablet Generic drug: apixaban Take 1 tablet (5 mg total) by mouth in the morning and at bedtime. Start taking on: July 23, 2020 What changed: These instructions start on July 23, 2020. If you are unsure what to do until then, ask your doctor or other care provider.   famotidine 10 MG tablet Commonly known as: PEPCID Take 10 mg by mouth at bedtime.   levothyroxine 88 MCG tablet Commonly known as: SYNTHROID Take 88 mcg by mouth daily before breakfast.   losartan 100 MG tablet Commonly known as: COZAAR Take 1 tablet (100 mg total) by mouth every morning.   multivitamin with minerals Tabs tablet Take 1 tablet by mouth daily.   prazosin 1 MG capsule Commonly known as: MINIPRESS Take 1 capsule (1 mg total) by mouth at bedtime.   sodium chloride 0.65 % Soln nasal spray Commonly known as: OCEAN Place 1 spray into both nostrils as needed for congestion.        Disposition:    Follow-up Information     Shirley Friar, PA-C Follow  up.   Specialty: Physician Assistant Why: on 07/28/2020 at 0800 for post pacemaker check Contact information: [address] [place] [address] [phone]                 Duration of Discharge Encounter: Greater than 30 minutes including physician time.  Jacalyn Lefevre, PA-C  07/18/2020 8:56 AM  EP Attending  Patient seen and examined. Agree with above.  A/P Symptomatic tachy brady syndrome - she is maintaining NSR after PPM insertion. PAF - on low dose amiodarone, she is in rhythm. I would suggest she be placed on 200 mg daily for several months and then slowly wean down  the dose.  Coags - she will restart the eliquis in 5 days.  Carleene Overlie Jakylan Ron,MD

## 2020-07-18 NOTE — Discharge Instructions (Signed)
After Your Pacemaker   You have a St. Jude Pacemaker  ACTIVITY Do not lift your arm above shoulder height for 1 week after your procedure. After 7 days, you may progress as below.  You should remove your sling 24 hours after your procedure, unless otherwise instructed by your provider.     Tuesday July 25, 2020  Wednesday July 26, 2020 Thursday July 27, 2020 Friday July 28, 2020   Do not lift, push, pull, or carry anything over 10 pounds with the affected arm until 6 weeks (Tuesday August 29, 2020 ) after your procedure.   You may drive AFTER your wound check, unless you have been told otherwise by your provider.   Ask your healthcare provider when you can go back to work   INCISION/Dressing If you are on a blood thinner such as Coumadin, Xarelto, Eliquis, Plavix, or Pradaxa please confirm with your provider when this should be resumed.   If large square, outer bandage is left in place, this can be removed after 24 hours from your procedure. Do not remove steri-strips or glue as below.   Monitor your Pacemaker site for redness, swelling, and drainage. Call the device clinic at [phone] if you experience these symptoms or fever/chills.  If your incision is sealed with Steri-strips or staples, you may shower 10 days after your procedure or when told by your provider. Do not remove the steri-strips or let the shower hit directly on your site. You may wash around your site with soap and water.    Avoid lotions, ointments, or perfumes over your incision until it is well-healed.  You may use a hot tub or a pool AFTER your wound check appointment if the incision is completely closed.  [employer] Alerts:  Some alerts are vibratory and others beep. These are NOT emergencies. Please call our office to let us know. If this occurs at night or on weekends, it can wait until the next business day. Send a remote transmission.  If your device is capable of reading fluid status (for heart failure),  you will be offered monthly monitoring to review this with you.   DEVICE MANAGEMENT Remote monitoring is used to monitor your pacemaker from home. This monitoring is scheduled every 91 days by our office. It allows [place] to keep an eye on the functioning of your device to ensure it is working properly. You will routinely see your Electrophysiologist annually (more often if necessary).   You should receive your ID card for your new device in 4-8 weeks. Keep this card with you at all times once received. Consider wearing a medical alert bracelet or necklace.  Your Pacemaker may be MRI compatible. This will be discussed at your next office visit/wound check.  You should avoid contact with strong electric or magnetic fields.   Do not use amateur (ham) radio equipment or electric (arc) welding torches. MP3 player headphones with magnets should not be used. Some devices are safe to use if held at least 12 inches (30 cm) from your Pacemaker. These include power tools, lawn mowers, and speakers. If you are unsure if something is safe to use, ask your health care provider.  When using your cell phone, hold it to the ear that is on the opposite side from the Pacemaker. Do not leave your cell phone in a pocket over the Pacemaker.  You may safely use electric blankets, heating pads, computers, and microwave ovens.  Call the office right away if: You have chest pain. You  feel more short of breath than you have felt before. You feel more light-headed than you have felt before. Your incision starts to open up.  This information is not intended to replace advice given to you by your health care provider. Make sure you discuss any questions you have with your health care provider.

## 2020-07-25 ENCOUNTER — Ambulatory Visit (HOSPITAL_COMMUNITY): Payer: Medicare HMO | Admitting: Physician Assistant

## 2020-07-26 ENCOUNTER — Ambulatory Visit (HOSPITAL_COMMUNITY): Payer: Medicare HMO | Admitting: Physician Assistant

## 2020-07-28 ENCOUNTER — Other Ambulatory Visit: Payer: Self-pay

## 2020-07-28 ENCOUNTER — Ambulatory Visit: Payer: Medicare HMO | Admitting: Student

## 2020-07-28 VITALS — BP 128/70 | HR 62

## 2020-07-28 DIAGNOSIS — R001 Bradycardia, unspecified: Secondary | ICD-10-CM | POA: Diagnosis not present

## 2020-07-28 LAB — CUP PACEART INCLINIC DEVICE CHECK
Battery Remaining Longevity: 109 mo
Battery Voltage: 3.05 V
Brady Statistic RA Percent Paced: 53 %
Brady Statistic RV Percent Paced: 0 %
Date Time Interrogation Session: 20220708081413
Implantable Lead Implant Date: 20220627
Implantable Lead Implant Date: 20220627
Implantable Lead Location: 753859
Implantable Lead Location: 753860
Implantable Pulse Generator Implant Date: 20220627
Lead Channel Impedance Value: 487.5 Ohm
Lead Channel Impedance Value: 550 Ohm
Lead Channel Pacing Threshold Amplitude: 0.75 V
Lead Channel Pacing Threshold Amplitude: 0.75 V
Lead Channel Pacing Threshold Amplitude: 1 V
Lead Channel Pacing Threshold Amplitude: 1 V
Lead Channel Pacing Threshold Pulse Width: 0.5 ms
Lead Channel Pacing Threshold Pulse Width: 0.5 ms
Lead Channel Pacing Threshold Pulse Width: 0.5 ms
Lead Channel Pacing Threshold Pulse Width: 0.5 ms
Lead Channel Sensing Intrinsic Amplitude: 11.1 mV
Lead Channel Sensing Intrinsic Amplitude: 3.5 mV
Lead Channel Setting Pacing Amplitude: 3.5 V
Lead Channel Setting Pacing Amplitude: 3.5 V
Lead Channel Setting Pacing Pulse Width: 0.5 ms
Lead Channel Setting Sensing Sensitivity: 2 mV
Pulse Gen Model: 2272
Pulse Gen Serial Number: 3933448

## 2020-07-28 NOTE — Progress Notes (Signed)
Pacemaker check in clinic. Normal device function. Thresholds, sensing, impedances consistent with previous measurements. Device programmed to maximize longevity. No mode switch or high ventricular rates noted. Device programmed at appropriate safety margins. Histogram distribution appropriate for patient activity level. Estimated longevity 9 yr, 1 mo. Patient enrolled in remote follow-up. Patient education completed.  BPs much improved at home. Low 130s at max. No further dizziness post pacing.

## 2020-08-01 DIAGNOSIS — H52229 Regular astigmatism, unspecified eye: Secondary | ICD-10-CM | POA: Diagnosis not present

## 2020-08-01 DIAGNOSIS — H35363 Drusen (degenerative) of macula, bilateral: Secondary | ICD-10-CM | POA: Diagnosis not present

## 2020-08-01 DIAGNOSIS — E7800 Pure hypercholesterolemia, unspecified: Secondary | ICD-10-CM | POA: Diagnosis not present

## 2020-08-01 DIAGNOSIS — I10 Essential (primary) hypertension: Secondary | ICD-10-CM | POA: Diagnosis not present

## 2020-08-01 DIAGNOSIS — Z0100 Encounter for examination of eyes and vision without abnormal findings: Secondary | ICD-10-CM | POA: Diagnosis not present

## 2020-08-01 DIAGNOSIS — H26491 Other secondary cataract, right eye: Secondary | ICD-10-CM | POA: Diagnosis not present

## 2020-08-08 ENCOUNTER — Ambulatory Visit: Payer: Medicare HMO

## 2020-08-12 ENCOUNTER — Other Ambulatory Visit: Payer: Self-pay | Admitting: Cardiology

## 2020-08-22 DIAGNOSIS — L570 Actinic keratosis: Secondary | ICD-10-CM | POA: Diagnosis not present

## 2020-08-22 DIAGNOSIS — Z1283 Encounter for screening for malignant neoplasm of skin: Secondary | ICD-10-CM | POA: Diagnosis not present

## 2020-08-22 DIAGNOSIS — D1801 Hemangioma of skin and subcutaneous tissue: Secondary | ICD-10-CM | POA: Diagnosis not present

## 2020-08-22 DIAGNOSIS — Z85828 Personal history of other malignant neoplasm of skin: Secondary | ICD-10-CM | POA: Diagnosis not present

## 2020-08-28 MED ORDER — AMIODARONE HCL 200 MG PO TABS: 200.0000 mg | ORAL_TABLET | Freq: Every day | ORAL | 1 refills | Status: DC

## 2020-09-11 ENCOUNTER — Other Ambulatory Visit: Payer: Self-pay

## 2020-09-11 MED ORDER — DILTIAZEM HCL ER COATED BEADS 300 MG PO CP24: 300.0000 mg | ORAL_CAPSULE | Freq: Every day | ORAL | 3 refills | Status: DC

## 2020-10-04 DIAGNOSIS — M1711 Unilateral primary osteoarthritis, right knee: Secondary | ICD-10-CM | POA: Diagnosis not present

## 2020-10-04 DIAGNOSIS — Z23 Encounter for immunization: Secondary | ICD-10-CM | POA: Diagnosis not present

## 2020-10-19 ENCOUNTER — Ambulatory Visit (INDEPENDENT_AMBULATORY_CARE_PROVIDER_SITE_OTHER): Payer: Medicare HMO

## 2020-10-19 DIAGNOSIS — I422 Other hypertrophic cardiomyopathy: Secondary | ICD-10-CM | POA: Diagnosis not present

## 2020-10-19 LAB — CUP PACEART REMOTE DEVICE CHECK
Battery Remaining Longevity: 83 mo
Battery Remaining Percentage: 95.5 %
Battery Voltage: 3.02 V
Brady Statistic AP VP Percent: 1 %
Brady Statistic AP VS Percent: 59 %
Brady Statistic AS VP Percent: 1 %
Brady Statistic AS VS Percent: 41 %
Brady Statistic RA Percent Paced: 59 %
Brady Statistic RV Percent Paced: 1 %
Date Time Interrogation Session: 20220929020019
Implantable Lead Implant Date: 20220627
Implantable Lead Implant Date: 20220627
Implantable Lead Location: 753859
Implantable Lead Location: 753860
Implantable Pulse Generator Implant Date: 20220627
Lead Channel Impedance Value: 490 Ohm
Lead Channel Impedance Value: 550 Ohm
Lead Channel Pacing Threshold Amplitude: 0.75 V
Lead Channel Pacing Threshold Amplitude: 1 V
Lead Channel Pacing Threshold Pulse Width: 0.5 ms
Lead Channel Pacing Threshold Pulse Width: 0.5 ms
Lead Channel Sensing Intrinsic Amplitude: 3.6 mV
Lead Channel Sensing Intrinsic Amplitude: 9.1 mV
Lead Channel Setting Pacing Amplitude: 3.5 V
Lead Channel Setting Pacing Amplitude: 3.5 V
Lead Channel Setting Pacing Pulse Width: 0.5 ms
Lead Channel Setting Sensing Sensitivity: 2 mV
Pulse Gen Model: 2272
Pulse Gen Serial Number: 3933448

## 2020-10-24 ENCOUNTER — Other Ambulatory Visit: Payer: Self-pay

## 2020-10-24 ENCOUNTER — Ambulatory Visit: Payer: Medicare HMO | Admitting: Internal Medicine

## 2020-10-24 ENCOUNTER — Encounter: Payer: Self-pay | Admitting: Internal Medicine

## 2020-10-24 VITALS — BP 150/70 | HR 60 | Ht 64.0 in | Wt 139.0 lb

## 2020-10-24 DIAGNOSIS — I480 Paroxysmal atrial fibrillation: Secondary | ICD-10-CM | POA: Diagnosis not present

## 2020-10-24 DIAGNOSIS — Z950 Presence of cardiac pacemaker: Secondary | ICD-10-CM | POA: Diagnosis not present

## 2020-10-24 DIAGNOSIS — R001 Bradycardia, unspecified: Secondary | ICD-10-CM

## 2020-10-24 MED ORDER — AMIODARONE HCL 200 MG PO TABS: ORAL_TABLET | ORAL | 3 refills | Status: DC

## 2020-10-24 NOTE — Patient Instructions (Addendum)
Medication Instructions:  Your physician has recommended you make the following change in your medication:    REDUCE your amiodarone 200 mg-  Take one tablet by mouth daily EXCEPT for Sunday.  Labwork: None ordered.  Testing/Procedures: None ordered.  Follow-Up: Your physician wants you to follow-up in: one year with Cristopher Peru, MD or one of the following Advanced Practice Providers on your designated Care Team:   Tommye Standard, [place] Legrand Como "Jonni Sanger" Chalmers Cater, [place]  Remote monitoring is used to monitor your Pacemaker from home. This monitoring reduces the number of office visits required to check your device to one time per year. It allows [place] to keep an eye on the functioning of your device to ensure it is working properly. You are scheduled for a device check from home on 01/18/2021. You may send your transmission at any time that day. If you have a wireless device, the transmission will be sent automatically. After your physician reviews your transmission, you will receive a postcard with your next transmission date.  Any Other Special Instructions Will Be Listed Below (If Applicable).  If you need a refill on your cardiac medications before your next appointment, please call your pharmacy.

## 2020-10-24 NOTE — Progress Notes (Signed)
HPI Paula [name] is a 85 y.o. female with apical hypertrophic cardiomyopathy, paroxysmal atrial fibrillation, hypertension, hypothyroidism who was found to have near syncope due to long sinus pauses and underwent DDD PM insertion. She feels much better. No syncope, chest pain or sob.  Allergies  Allergen Reactions   Wound Dressing Adhesive     Other reaction(s): rash     Current Outpatient Medications  Medication Sig Dispense Refill   acetaminophen (TYLENOL) 325 MG tablet Take 2 tablets (650 mg total) by mouth every 4 (four) hours as needed for headache or mild pain.     alendronate (FOSAMAX) 70 MG tablet Take 70 mg by mouth once a week. Sunday     amiodarone (PACERONE) 200 MG tablet Take one tablet by mouth daily EXCEPT Sunday. 90 tablet 3   atorvastatin (LIPITOR) 10 MG tablet Take 10 mg by mouth daily at 6 PM.     calcium carbonate (OS-CAL - DOSED IN MG OF ELEMENTAL CALCIUM) 1250 (500 Ca) MG tablet Take 1 tablet by mouth daily with breakfast.     diltiazem (CARDIZEM CD) 300 MG 24 hr capsule Take 1 capsule (300 mg total) by mouth daily. 90 capsule 3   ELIQUIS 5 MG TABS tablet Take 1 tablet (5 mg total) by mouth in the morning and at bedtime. 60 tablet    famotidine (PEPCID) 10 MG tablet Take 10 mg by mouth at bedtime.     levothyroxine (SYNTHROID, LEVOTHROID) 88 MCG tablet Take 88 mcg by mouth daily before breakfast.     losartan (COZAAR) 100 MG tablet Take 1 tablet (100 mg total) by mouth every morning. 90 tablet 3   Multiple Vitamin (MULTIVITAMIN WITH MINERALS) TABS tablet Take 1 tablet by mouth daily.     prazosin (MINIPRESS) 1 MG capsule TAKE 1 CAPSULE AT BEDTIME 90 capsule 3   sodium chloride (OCEAN) 0.65 % SOLN nasal spray Place 1 spray into both nostrils as needed for congestion.     No current facility-administered medications for this visit.     Past Medical History:  Diagnosis Date   CHF (congestive heart failure) (HCC)    Hypothyroidism    Irregular  heart rate    Thyroid disease     ROS:   All systems reviewed and negative except as noted in the HPI.   Past Surgical History:  Procedure Laterality Date   PACEMAKER IMPLANT N/A 07/17/2020   Procedure: PACEMAKER IMPLANT;  Surgeon: Evans Lance, MD;  Location: [facility];  Service: Cardiovascular;  Laterality: N/A;     Family History  Problem Relation Age of Onset   Breast cancer Sister 37     Social History   Socioeconomic History   Marital status: Married    Spouse name: Not on file   Number of children: Not on file   Years of education: Not on file   Highest education level: Not on file  Occupational History   Not on file  Tobacco Use   Smoking status: Never   Smokeless tobacco: Never  Vaping Use   Vaping Use: Never used  Substance and Sexual Activity   Alcohol use: Yes    Alcohol/week: 1.0 standard drink    Types: 1 Glasses of wine per week   Drug use: No   Sexual activity: Not Currently    Birth control/protection: Post-menopausal  Other Topics Concern   Not on file  Social History Narrative   Not on file   Social Determinants  of Health   Financial Resource Strain: Not on file  Food Insecurity: Not on file  Transportation Needs: Not on file  Physical Activity: Not on file  Stress: Not on file  Social Connections: Not on file  Intimate Partner Violence: Not on file     BP (!) 150/70 (BP Location: Left Arm, Patient Position: Sitting, Cuff Size: Normal)   Pulse 60   Ht 5\' 4"  (1.626 m)   Wt 139 lb (63 kg)   SpO2 97%   BMI 23.86 kg/m   Physical Exam:  Well appearing NAD HEENT: Unremarkable Neck:  No JVD, no thyromegally Lymphatics:  No adenopathy Back:  No CVA tenderness Lungs:  Clear with no wheezes HEART:  Regular rate rhythm, no murmurs, no rubs, no clicks Abd:  soft, positive bowel sounds, no organomegally, no rebound, no guarding Ext:  2 plus pulses, no edema, no cyanosis, no clubbing Skin:  No rashes no nodules Neuro:   CN II through XII intact, motor grossly intact  EKG - nsr with atrial pacing  DEVICE  Normal device function.  See PaceArt for details. Histogram a little flat  Assess/Plan:  Near syncope - this has resolved since her DDD PM was inserted.   2.  Atrial fibrillation - she will remain on amiodarone but as she has not had any additional atrial fib, I would recommend reducing the dose to 200 mg daily, 6 days a week, none on Sunday.  3.  Apical variant hypertrophic cardiomyopathy - she is stable with essentially no symptoms.   4. Coags - she has not had any bleeding on Eliquis. Continue.   Carleene Overlie Romelle Reiley,MD

## 2020-10-30 NOTE — Progress Notes (Signed)
Remote pacemaker transmission.   

## 2020-11-01 NOTE — Addendum Note (Signed)
Addended by: Maren Beach, Kaiyla Stahly A on: 11/01/2020 07:30 AM   Modules accepted: Orders

## 2020-11-08 NOTE — Addendum Note (Signed)
Addended by: Maren Beach, Gerold Sar A on: 11/08/2020 10:01 AM   Modules accepted: Orders

## 2020-11-10 DIAGNOSIS — D6869 Other thrombophilia: Secondary | ICD-10-CM | POA: Diagnosis not present

## 2020-11-10 DIAGNOSIS — I10 Essential (primary) hypertension: Secondary | ICD-10-CM | POA: Diagnosis not present

## 2020-11-10 DIAGNOSIS — Z950 Presence of cardiac pacemaker: Secondary | ICD-10-CM | POA: Diagnosis not present

## 2020-11-10 DIAGNOSIS — R001 Bradycardia, unspecified: Secondary | ICD-10-CM | POA: Diagnosis not present

## 2020-11-10 DIAGNOSIS — I480 Paroxysmal atrial fibrillation: Secondary | ICD-10-CM | POA: Diagnosis not present

## 2020-11-24 ENCOUNTER — Other Ambulatory Visit: Payer: Self-pay | Admitting: Cardiology

## 2020-11-27 ENCOUNTER — Other Ambulatory Visit: Payer: Self-pay | Admitting: Geriatric Medicine

## 2020-11-27 DIAGNOSIS — Z1231 Encounter for screening mammogram for malignant neoplasm of breast: Secondary | ICD-10-CM

## 2020-12-24 NOTE — Progress Notes (Signed)
Cardiology Office Note:    Date:  12/27/2020   ID:  Paula [name], DOB 15-Jun-1935, MRN [id number]  PCP:  Lajean Manes, MD  Cardiologist:  None  Electrophysiologist:  Vickie Epley, MD   Referring MD: Lajean Manes, MD   Chief Complaint  Patient presents with   Cardiomyopathy      History of Present Illness:    Paula [name] is a 85 y.o. female with a hx of paroxysmal atrial fibrillation, hypertension, hypothyroidism who presents for follow-up.  She was referred by [name], PA for evaluation of hypertrophic cardiomyopathy, initially seen on 06/14/2019.  Diagnosed with atrial fibrillation after presenting with palpitations to PCP office in April. EKG showed A. fib with RVR. Started on Eliquis given CHA2DS2-VASc score 5.  Also started on diltiazem for rate control. She was seen in AF clinic on 05/11/2019, she was noted to be in normal sinus rhythm. She was continued on Eliquis 5 mg twice daily and diltiazem 180 mg daily. TTE on 05/27/19 was concerning for apical variant hypertrophic cardiomyopathy, EF 60 to 65%, normal RV function, moderate elevation in RVSP (48 mmHg), mild mitral regurgitation, mild to moderate tricuspid regurgitation.  Cardiac MRI on 07/05/2019 showed LV apical hypertrophy measuring up to 15 mm, consistent with apical hypertrophic cardiomyopathy, small apical aneurysm, patchy apical LGE accounting for 1% of total myocardial mass, hyperdynamic LV systolic function, normal RV function, mild MR (regurgitant fraction 24%).  Zio patch x7 days on 07/14/2019 showed AF burden 47%, with episode lasting 3 days and average rate 118 bpm, no NSVT, frequent episodes of SVT, longest lasting 19 seconds.  She was admitted in June 2022 with near syncope, underwent PPM for tachybrady syndrome.  Since last clinic visit, she reports that she is doing well.  States that she feels much better since her pacemaker was placed.  Lightheadedness/fatigue have nearly resolved, does  report some mild lightheadedness and the morning.  Denies any syncope.  Denies any chest pain, dyspnea, lower extremity edema, or palpitations.  No bleeding issues on Eliquis.  Past Medical History:  Diagnosis Date   CHF (congestive heart failure) (HCC)    Hypothyroidism    Irregular heart rate    Thyroid disease     Past Surgical History:  Procedure Laterality Date   PACEMAKER IMPLANT N/A 07/17/2020   Procedure: PACEMAKER IMPLANT;  Surgeon: Evans Lance, MD;  Location: [facility];  Service: Cardiovascular;  Laterality: N/A;    Current Medications: Current Meds  Medication Sig   acetaminophen (TYLENOL) 325 MG tablet Take 2 tablets (650 mg total) by mouth every 4 (four) hours as needed for headache or mild pain.   alendronate (FOSAMAX) 70 MG tablet Take 70 mg by mouth once a week. Sunday   amiodarone (PACERONE) 200 MG tablet Take one tablet by mouth daily EXCEPT Sunday.   atorvastatin (LIPITOR) 10 MG tablet Take 10 mg by mouth daily at 6 PM.   calcium carbonate (OS-CAL - DOSED IN MG OF ELEMENTAL CALCIUM) 1250 (500 Ca) MG tablet Take 1 tablet by mouth daily with breakfast.   diltiazem (CARDIZEM CD) 300 MG 24 hr capsule Take 1 capsule (300 mg total) by mouth daily.   ELIQUIS 5 MG TABS tablet Take 1 tablet (5 mg total) by mouth in the morning and at bedtime.   famotidine (PEPCID) 10 MG tablet Take 10 mg by mouth at bedtime.   levothyroxine (SYNTHROID, LEVOTHROID) 88 MCG tablet Take 88 mcg by mouth daily before breakfast.  losartan (COZAAR) 100 MG tablet Take 1 tablet (100 mg total) by mouth daily.   Multiple Vitamin (MULTIVITAMIN WITH MINERALS) TABS tablet Take 1 tablet by mouth daily.   prazosin (MINIPRESS) 1 MG capsule TAKE 1 CAPSULE AT BEDTIME   sodium chloride (OCEAN) 0.65 % SOLN nasal spray Place 1 spray into both nostrils as needed for congestion.     Allergies:   Wound dressing adhesive   Social History   Socioeconomic History   Marital status: Married    Spouse  name: Not on file   Number of children: Not on file   Years of education: Not on file   Highest education level: Not on file  Occupational History   Not on file  Tobacco Use   Smoking status: Never   Smokeless tobacco: Never  Vaping Use   Vaping Use: Never used  Substance and Sexual Activity   Alcohol use: Yes    Alcohol/week: 1.0 standard drink    Types: 1 Glasses of wine per week   Drug use: No   Sexual activity: Not Currently    Birth control/protection: Post-menopausal  Other Topics Concern   Not on file  Social History Narrative   Not on file   Social Determinants of Health   Financial Resource Strain: Not on file  Food Insecurity: Not on file  Transportation Needs: Not on file  Physical Activity: Not on file  Stress: Not on file  Social Connections: Not on file     Family History: The patient's family history includes Breast cancer (age of onset: 4) in her sister.  ROS:   Please see the history of present illness.    (+) Lightheadedness All other systems reviewed and are negative.  EKGs/Labs/Other Studies Reviewed:    The following studies were reviewed today:  Monitor 05/01/2020: No significant abnormalities   Patch Wear Time:  2 days and 23 hours (2022-04-01T16:38:40-0400 to 2022-04-04T16:10:58-0400)   Patient had a min HR of 35 bpm, max HR of 119 bpm, and avg HR of 61 bpm. Predominant underlying rhythm was Sinus Rhythm. 2 Supraventricular Tachycardia runs occurred, the run with the fastest interval lasting 4 beats with a max rate of 119 bpm, the longest lasting 4 beats with an avg rate of 98 bpm. Isolated SVEs were rare (<1.0%), SVE Couplets were rare (<1.0%), and SVE Triplets were rare (<1.0%). Isolated VEs were rare (<1.0%), and no VE Couplets or VE Triplets were present.  No patient triggered events  Monitor 07/14/2019: Episode of atrial fibrillation lasting 3 days 6 hours, average rate 118 bpm. AF burden 47%. 141 episodes of SVT, longest lasting 18.5  seconds.   7 days of data recorded on Zio monitor. Patient had a min HR of 55 bpm, max HR of 207 bpm, and avg HR of 95 bpm. Predominant underlying rhythm was Sinus Rhythm. No VT, high degree block, or pauses noted.  141 episodes of SVT, longest lasting 18.5 seconds.  Episode of atrial fibrillation lasting 3 days 6 hours, average rate 118 bpm.  AF burden 47%.   Isolated atrial and ventricular ectopy was rare (<1%). There were 0 triggered events.  CMR 07/05/2019: 1. LV hypertrophy at apex measuring up to 29mm, consistent with apical hypertrophic cardiomyopathy   2.  Small apical aneurysm   3. Patchy LGE at apex, consistent with apical HCM. LGE accounts for 1% of total myocardial mass   4.  Normal LV size with hyperdynamic systolic function (EF 84%)   5.  Normal RV size and  systolic function (EF 50%)   6. Mild mitral regurgitation (regurgitant volume 16cc, regurgitant fraction 24%)  Echo 05/27/2019: 1. Apical variant hypertrophic cardiomyopathy with no evidence of apical  thrombus. Left ventricular ejection fraction, by estimation, is 60 to 65%.  The left ventricle has normal function. The left ventricle demonstrates  regional wall motion  abnormalities (see scoring diagram/findings for description). There is  severe left ventricular hypertrophy of the apical segment. Left  ventricular diastolic parameters are consistent with Grade I diastolic  dysfunction (impaired relaxation).   2. Right ventricular systolic function is normal. The right ventricular  size is normal. There is moderately elevated pulmonary artery systolic  pressure. The estimated right ventricular systolic pressure is 93.2 mmHg.   3. Left atrial size was mildly dilated.   4. The mitral valve is normal in structure. Mild mitral valve  regurgitation. No evidence of mitral stenosis.   5. Tricuspid valve regurgitation is mild to moderate.   6. Possible fusion of right and non coronary cusp. The aortic valve is  normal in  structure. Aortic valve regurgitation is not visualized. Mild to  moderate aortic valve sclerosis/calcification is present, without any  evidence of aortic stenosis.   7. The inferior vena cava is normal in size with greater than 50%  respiratory variability, suggesting right atrial pressure of 3 mmHg.  Exercise Myoview 07/12/2016: Nuclear stress EF: 64%. Blood pressure demonstrated a normal response to exercise. Horizontal ST segment depression ST segment depression was noted during stress in the III, aVF, V4, V5 and V6 leads, and returning to baseline after 1-5 minutes of recovery. Findings are nonspecific given baseline T wave inversions. The study is normal. This is a low risk study. The left ventricular ejection fraction is normal (55-65%).   EKG:   12/27/20: Atrial paced, rate 60, Q waves in V1/2, no ST abnormalities 07/10/2020: EKG is not ordered today. 04/14/2020: sinus rhythm, rate 74, LVH with repolarization abnormalities  Recent Labs: 07/15/2020: ALT 25; TSH 3.153 07/17/2020: BUN 8; Creatinine, Ser 1.08; Potassium 4.1; Sodium 134 07/18/2020: Hemoglobin 10.8; Platelets 268  Recent Lipid Panel No results found for: CHOL, TRIG, HDL, CHOLHDL, VLDL, LDLCALC, LDLDIRECT  Physical Exam:    VS:  BP 120/60   Pulse 60   Ht 5\' 4"  (1.626 m)   Wt 137 lb 6.4 oz (62.3 kg)   SpO2 95%   BMI 23.58 kg/m     Wt Readings from Last 3 Encounters:  12/27/20 137 lb 6.4 oz (62.3 kg)  10/24/20 139 lb (63 kg)  07/18/20 140 lb 12.8 oz (63.9 kg)     GEN: in no acute distress HEENT: Normal NECK: No JVD; No carotid bruits CARDIAC: RRR, 2/6 systolic murmur RESPIRATORY:  Clear to auscultation without rales, wheezing or rhonchi  ABDOMEN: Soft, non-tender, non-distended MUSCULOSKELETAL:  No edema; No deformity  SKIN: Warm and dry NEUROLOGIC:  Alert and oriented x 3 PSYCHIATRIC:  Normal affect   ASSESSMENT:    1. Hypertrophic cardiomyopathy (HCC)   2. Paroxysmal atrial fibrillation ([place])   3.  SVT (supraventricular tachycardia) ([place])   4. Essential hypertension   5. Hyperlipidemia, unspecified hyperlipidemia type      PLAN:    Hypertrophic cardiomyopathy: cardiac MRI on 07/05/2019 showed LV apical hypertrophy measuring up to 15 mm, consistent with apical hypertrophic cardiomyopathy, small apical aneurysm, patchy apical LGE accounting for 1% of total myocardial mass.  No NSVT on Zio patch x 7 days -Recommend first degree relatives be screened, reports her son underwent screening echocardiogram  Paroxysmal atrial fibrillation: Diagnosed 04/2019 after presenting to PCPs office with palpitations. CHA2DS2-VASc score 5.  Zio patch x7 days on 07/14/2019 showed AF burden 47%, with episode lasting 3 days and average rate 118 bpm.  She was admitted in June 2022 with near syncope, underwent PPM for tachybrady syndrome. -Continue Eliquis 5 mg twice daily -Continue amiodarone 200 mg daily.  We will check CMP, TSH -Continue diltiazem 300 mg daily  SVT: Frequent episodes on ZIO monitoring, longest lasting 19 seconds.  Continue diltiazem  Hypertension: On losartan 100 mg daily, prazosin 1 mg nightly, diltiazem 300 mg daily.  Appears controlled  Hyperlipidemia: On atorvastatin 10 mg daily   RTC in 6 months   Medication Adjustments/Labs and Tests Ordered: Current medicines are reviewed at length with the patient today.  Concerns regarding medicines are outlined above.  Orders Placed This Encounter  Procedures   Comprehensive metabolic panel   TSH   EKG 12-Lead    No orders of the defined types were placed in this encounter.    Patient Instructions  .Medication Instructions:  The current medical regimen is effective;  continue present plan and medications.  *If you need a refill on your cardiac medications before your next appointment, please call your pharmacy*   Lab Work: CMET, TSH today   If you have labs (blood work) drawn today and your tests are completely normal, you will  receive your results only by: [place] (if you have MyChart) OR A paper copy in the mail If you have any lab test that is abnormal or we need to change your treatment, we will call you to review the results.  Follow-Up: At [facility], you and your health needs are our priority.  As part of our continuing mission to provide you with exceptional heart care, we have created designated Provider Care Teams.  These Care Teams include your primary Cardiologist (physician) and Advanced Practice Providers (APPs -  Physician Assistants and Nurse Practitioners) who all work together to provide you with the care you need, when you need it.  We recommend signing up for the patient portal called "MyChart".  Sign up information is provided on this After Visit Summary.  MyChart is used to connect with patients for Virtual Visits (Telemedicine).  Patients are able to view lab/test results, encounter notes, upcoming appointments, etc.  Non-urgent messages can be sent to your provider as well.   To learn more about what you can do with MyChart, go to [email or web address].    Your next appointment:   6 month(s)  The format for your next appointment:   In Person  Provider:   Dr.Fanta Wimberley     I,Mathew Nicut as a scribe for Donato Heinz, MD.,have documented all relevant documentation on the behalf of Donato Heinz, MD,as directed by  Donato Heinz, MD while in the presence of Donato Heinz, MD.   I, Donato Heinz, MD, have reviewed all documentation for this visit. The documentation on 12/27/20 for the exam, diagnosis, procedures, and orders are all accurate and complete.   Signed, Donato Heinz, MD  12/27/2020 6:03 PM    [place]

## 2020-12-27 ENCOUNTER — Other Ambulatory Visit: Payer: Self-pay

## 2020-12-27 ENCOUNTER — Encounter: Payer: Self-pay | Admitting: Cardiology

## 2020-12-27 ENCOUNTER — Ambulatory Visit: Payer: Medicare HMO | Admitting: Cardiology

## 2020-12-27 VITALS — BP 120/60 | HR 60 | Ht 64.0 in | Wt 137.4 lb

## 2020-12-27 DIAGNOSIS — E785 Hyperlipidemia, unspecified: Secondary | ICD-10-CM

## 2020-12-27 DIAGNOSIS — I422 Other hypertrophic cardiomyopathy: Secondary | ICD-10-CM | POA: Diagnosis not present

## 2020-12-27 DIAGNOSIS — I480 Paroxysmal atrial fibrillation: Secondary | ICD-10-CM

## 2020-12-27 DIAGNOSIS — I10 Essential (primary) hypertension: Secondary | ICD-10-CM | POA: Diagnosis not present

## 2020-12-27 DIAGNOSIS — I471 Supraventricular tachycardia: Secondary | ICD-10-CM | POA: Diagnosis not present

## 2020-12-27 NOTE — Patient Instructions (Addendum)
.  Medication Instructions:  The current medical regimen is effective;  continue present plan and medications.  *If you need a refill on your cardiac medications before your next appointment, please call your pharmacy*   Lab Work: CMET, TSH today   If you have labs (blood work) drawn today and your tests are completely normal, you will receive your results only by: [place] (if you have MyChart) OR A paper copy in the mail If you have any lab test that is abnormal or we need to change your treatment, we will call you to review the results.  Follow-Up: At [facility], you and your health needs are our priority.  As part of our continuing mission to provide you with exceptional heart care, we have created designated Provider Care Teams.  These Care Teams include your primary Cardiologist (physician) and Advanced Practice Providers (APPs -  Physician Assistants and Nurse Practitioners) who all work together to provide you with the care you need, when you need it.  We recommend signing up for the patient portal called "MyChart".  Sign up information is provided on this After Visit Summary.  MyChart is used to connect with patients for Virtual Visits (Telemedicine).  Patients are able to view lab/test results, encounter notes, upcoming appointments, etc.  Non-urgent messages can be sent to your provider as well.   To learn more about what you can do with MyChart, go to [email or web address].    Your next appointment:   6 month(s)  The format for your next appointment:   In Person  Provider:   Dr.Schumann

## 2020-12-28 ENCOUNTER — Telehealth: Payer: Self-pay | Admitting: *Deleted

## 2020-12-28 DIAGNOSIS — E785 Hyperlipidemia, unspecified: Secondary | ICD-10-CM

## 2020-12-28 DIAGNOSIS — R7989 Other specified abnormal findings of blood chemistry: Secondary | ICD-10-CM

## 2020-12-28 LAB — COMPREHENSIVE METABOLIC PANEL
ALT: 90 IU/L — ABNORMAL HIGH (ref 0–32)
AST: 41 IU/L — ABNORMAL HIGH (ref 0–40)
Albumin/Globulin Ratio: 1.8 (ref 1.2–2.2)
Albumin: 4.2 g/dL (ref 3.6–4.6)
Alkaline Phosphatase: 88 IU/L (ref 44–121)
BUN/Creatinine Ratio: 10 — ABNORMAL LOW (ref 12–28)
BUN: 12 mg/dL (ref 8–27)
Bilirubin Total: 0.3 mg/dL (ref 0.0–1.2)
CO2: 22 mmol/L (ref 20–29)
Calcium: 8.8 mg/dL (ref 8.7–10.3)
Chloride: 97 mmol/L (ref 96–106)
Creatinine, Ser: 1.15 mg/dL — ABNORMAL HIGH (ref 0.57–1.00)
Globulin, Total: 2.3 g/dL (ref 1.5–4.5)
Glucose: 92 mg/dL (ref 70–99)
Potassium: 4.5 mmol/L (ref 3.5–5.2)
Sodium: 133 mmol/L — ABNORMAL LOW (ref 134–144)
Total Protein: 6.5 g/dL (ref 6.0–8.5)
eGFR: 47 mL/min/{1.73_m2} — ABNORMAL LOW (ref >59)

## 2020-12-28 LAB — TSH: TSH: 1.77 u[IU]/mL (ref 0.450–4.500)

## 2020-12-28 NOTE — Telephone Encounter (Signed)
Unable to reach pt or leave a message  ,y chart message sent to patient to have her call to discuss [place] can be done at [employer] as she lives [place]

## 2020-12-28 NOTE — Telephone Encounter (Signed)
-----   Message from Donato Heinz, MD sent at 12/28/2020  6:41 AM EST ----- Sodium mildly reduced but stable.  Stable kidney function.  Normal thyroid level.  Her liver enzymes are mildly elevated.  Could potentially be due to amiodarone or statin use.  Recommend holding statin and repeat LFTs in 1 week, along with lipid panel.  If LFTs remain elevated, may need to discontinue amiodarone, will discuss with EP.  But also recommend checking right upper quadrant ultrasound

## 2021-01-01 NOTE — Telephone Encounter (Signed)
pt aware of results  Lab orders mailed to the pt  Order placed for [place]

## 2021-01-04 ENCOUNTER — Ambulatory Visit
Admission: RE | Admit: 2021-01-04 | Discharge: 2021-01-04 | Disposition: A | Discharge status: Home / Self Care | Payer: Medicare HMO | Source: Ambulatory Visit | Attending: Cardiology | Admitting: Cardiology

## 2021-01-04 DIAGNOSIS — R7989 Other specified abnormal findings of blood chemistry: Secondary | ICD-10-CM | POA: Diagnosis not present

## 2021-01-04 DIAGNOSIS — K8020 Calculus of gallbladder without cholecystitis without obstruction: Secondary | ICD-10-CM | POA: Diagnosis not present

## 2021-01-10 ENCOUNTER — Ambulatory Visit
Admission: RE | Admit: 2021-01-10 | Discharge: 2021-01-10 | Disposition: A | Discharge status: Home / Self Care | Payer: Medicare HMO | Source: Ambulatory Visit | Attending: Geriatric Medicine | Admitting: Geriatric Medicine

## 2021-01-10 DIAGNOSIS — Z1231 Encounter for screening mammogram for malignant neoplasm of breast: Secondary | ICD-10-CM | POA: Diagnosis not present

## 2021-01-10 DIAGNOSIS — E785 Hyperlipidemia, unspecified: Secondary | ICD-10-CM | POA: Diagnosis not present

## 2021-01-10 LAB — HEPATIC FUNCTION PANEL
ALT: 36 IU/L — ABNORMAL HIGH (ref 0–32)
AST: 38 IU/L (ref 0–40)
Albumin: 4.5 g/dL (ref 3.6–4.6)
Alkaline Phosphatase: 80 IU/L (ref 44–121)
Bilirubin Total: 0.6 mg/dL (ref 0.0–1.2)
Bilirubin, Direct: 0.16 mg/dL (ref 0.00–0.40)
Total Protein: 7.5 g/dL (ref 6.0–8.5)

## 2021-01-10 LAB — LIPID PANEL
Chol/HDL Ratio: 2.7 ratio (ref 0.0–4.4)
Cholesterol, Total: 217 mg/dL — ABNORMAL HIGH (ref 100–199)
HDL: 80 mg/dL (ref >39)
LDL Chol Calc (NIH): 123 mg/dL — ABNORMAL HIGH (ref 0–99)
Triglycerides: 81 mg/dL (ref 0–149)
VLDL Cholesterol Cal: 14 mg/dL (ref 5–40)

## 2021-01-11 ENCOUNTER — Telehealth: Payer: Self-pay | Admitting: *Deleted

## 2021-01-11 DIAGNOSIS — E785 Hyperlipidemia, unspecified: Secondary | ICD-10-CM

## 2021-01-11 MED ORDER — ROSUVASTATIN CALCIUM 5 MG PO TABS: 5.0000 mg | ORAL_TABLET | Freq: Every day | ORAL | 3 refills | Status: DC

## 2021-01-11 NOTE — Telephone Encounter (Signed)
-----   Message from Donato Heinz, MD sent at 01/11/2021  6:50 AM EST ----- Liver enzymes have improved with holding atorvastatin.  Recommend switching to rosuvastatin 5 mg daily and repeat lipid panel/LFTs in 2 months

## 2021-01-11 NOTE — Telephone Encounter (Signed)
pt aware of results  New script sent to the pharmacy  Lab orders mailed to the pt  

## 2021-01-18 ENCOUNTER — Ambulatory Visit (INDEPENDENT_AMBULATORY_CARE_PROVIDER_SITE_OTHER): Payer: Medicare HMO

## 2021-01-18 DIAGNOSIS — I422 Other hypertrophic cardiomyopathy: Secondary | ICD-10-CM | POA: Diagnosis not present

## 2021-01-18 LAB — CUP PACEART REMOTE DEVICE CHECK
Battery Remaining Longevity: 112 mo
Battery Remaining Percentage: 95.5 %
Battery Voltage: 3.02 V
Brady Statistic AP VP Percent: 1 %
Brady Statistic AP VS Percent: 66 %
Brady Statistic AS VP Percent: 1 %
Brady Statistic AS VS Percent: 34 %
Brady Statistic RA Percent Paced: 66 %
Brady Statistic RV Percent Paced: 1 %
Date Time Interrogation Session: 20221229020015
Implantable Lead Implant Date: 20220627
Implantable Lead Implant Date: 20220627
Implantable Lead Location: 753859
Implantable Lead Location: 753860
Implantable Pulse Generator Implant Date: 20220627
Lead Channel Impedance Value: 440 Ohm
Lead Channel Impedance Value: 540 Ohm
Lead Channel Pacing Threshold Amplitude: 0.75 V
Lead Channel Pacing Threshold Amplitude: 1.625 V
Lead Channel Pacing Threshold Pulse Width: 0.5 ms
Lead Channel Pacing Threshold Pulse Width: 0.5 ms
Lead Channel Sensing Intrinsic Amplitude: 11.7 mV
Lead Channel Sensing Intrinsic Amplitude: 3.5 mV
Lead Channel Setting Pacing Amplitude: 2.5 V
Lead Channel Setting Pacing Amplitude: 3.125
Lead Channel Setting Pacing Pulse Width: 0.5 ms
Lead Channel Setting Sensing Sensitivity: 2 mV
Pulse Gen Model: 2272
Pulse Gen Serial Number: 3933448

## 2021-01-30 NOTE — Progress Notes (Signed)
Remote pacemaker transmission.   

## 2021-02-07 DIAGNOSIS — Z950 Presence of cardiac pacemaker: Secondary | ICD-10-CM | POA: Diagnosis not present

## 2021-02-07 DIAGNOSIS — D6869 Other thrombophilia: Secondary | ICD-10-CM | POA: Diagnosis not present

## 2021-02-07 DIAGNOSIS — K8020 Calculus of gallbladder without cholecystitis without obstruction: Secondary | ICD-10-CM | POA: Diagnosis not present

## 2021-02-07 DIAGNOSIS — I480 Paroxysmal atrial fibrillation: Secondary | ICD-10-CM | POA: Diagnosis not present

## 2021-02-07 DIAGNOSIS — E7800 Pure hypercholesterolemia, unspecified: Secondary | ICD-10-CM | POA: Diagnosis not present

## 2021-02-07 DIAGNOSIS — I10 Essential (primary) hypertension: Secondary | ICD-10-CM | POA: Diagnosis not present

## 2021-03-15 DIAGNOSIS — M1711 Unilateral primary osteoarthritis, right knee: Secondary | ICD-10-CM | POA: Diagnosis not present

## 2021-03-20 DIAGNOSIS — E785 Hyperlipidemia, unspecified: Secondary | ICD-10-CM | POA: Diagnosis not present

## 2021-03-20 LAB — LIPID PANEL
Chol/HDL Ratio: 1.9 ratio (ref 0.0–4.4)
Cholesterol, Total: 174 mg/dL (ref 100–199)
HDL: 93 mg/dL (ref >39)
LDL Chol Calc (NIH): 66 mg/dL (ref 0–99)
Triglycerides: 79 mg/dL (ref 0–149)
VLDL Cholesterol Cal: 15 mg/dL (ref 5–40)

## 2021-03-20 LAB — HEPATIC FUNCTION PANEL
ALT: 34 IU/L — ABNORMAL HIGH (ref 0–32)
AST: 26 IU/L (ref 0–40)
Albumin: 4.5 g/dL (ref 3.6–4.6)
Alkaline Phosphatase: 77 IU/L (ref 44–121)
Bilirubin Total: 0.5 mg/dL (ref 0.0–1.2)
Bilirubin, Direct: 0.17 mg/dL (ref 0.00–0.40)
Total Protein: 7.1 g/dL (ref 6.0–8.5)

## 2021-04-06 DIAGNOSIS — E039 Hypothyroidism, unspecified: Secondary | ICD-10-CM | POA: Diagnosis not present

## 2021-04-06 DIAGNOSIS — I10 Essential (primary) hypertension: Secondary | ICD-10-CM | POA: Diagnosis not present

## 2021-04-06 DIAGNOSIS — E7800 Pure hypercholesterolemia, unspecified: Secondary | ICD-10-CM | POA: Diagnosis not present

## 2021-04-06 DIAGNOSIS — K219 Gastro-esophageal reflux disease without esophagitis: Secondary | ICD-10-CM | POA: Diagnosis not present

## 2021-04-06 DIAGNOSIS — M8580 Other specified disorders of bone density and structure, unspecified site: Secondary | ICD-10-CM | POA: Diagnosis not present

## 2021-04-06 DIAGNOSIS — I480 Paroxysmal atrial fibrillation: Secondary | ICD-10-CM | POA: Diagnosis not present

## 2021-04-16 DIAGNOSIS — R69 Illness, unspecified: Secondary | ICD-10-CM | POA: Diagnosis not present

## 2021-04-19 ENCOUNTER — Ambulatory Visit (INDEPENDENT_AMBULATORY_CARE_PROVIDER_SITE_OTHER): Payer: Medicare HMO

## 2021-04-19 DIAGNOSIS — I422 Other hypertrophic cardiomyopathy: Secondary | ICD-10-CM | POA: Diagnosis not present

## 2021-04-19 LAB — CUP PACEART REMOTE DEVICE CHECK
Battery Remaining Longevity: 110 mo
Battery Remaining Percentage: 95.5 %
Battery Voltage: 3.02 V
Brady Statistic AP VP Percent: 1 %
Brady Statistic AP VS Percent: 65 %
Brady Statistic AS VP Percent: 1 %
Brady Statistic AS VS Percent: 34 %
Brady Statistic RA Percent Paced: 66 %
Brady Statistic RV Percent Paced: 1 %
Date Time Interrogation Session: 20230330022742
Implantable Lead Implant Date: 20220627
Implantable Lead Implant Date: 20220627
Implantable Lead Location: 753859
Implantable Lead Location: 753860
Implantable Pulse Generator Implant Date: 20220627
Lead Channel Impedance Value: 430 Ohm
Lead Channel Impedance Value: 550 Ohm
Lead Channel Pacing Threshold Amplitude: 0.75 V
Lead Channel Pacing Threshold Amplitude: 1.5 V
Lead Channel Pacing Threshold Pulse Width: 0.5 ms
Lead Channel Pacing Threshold Pulse Width: 0.5 ms
Lead Channel Sensing Intrinsic Amplitude: 4 mV
Lead Channel Sensing Intrinsic Amplitude: 8.6 mV
Lead Channel Setting Pacing Amplitude: 2.5 V
Lead Channel Setting Pacing Amplitude: 2.5 V
Lead Channel Setting Pacing Pulse Width: 0.5 ms
Lead Channel Setting Sensing Sensitivity: 2 mV
Pulse Gen Model: 2272
Pulse Gen Serial Number: 3933448

## 2021-04-25 DIAGNOSIS — H52229 Regular astigmatism, unspecified eye: Secondary | ICD-10-CM | POA: Diagnosis not present

## 2021-04-25 DIAGNOSIS — H35363 Drusen (degenerative) of macula, bilateral: Secondary | ICD-10-CM | POA: Diagnosis not present

## 2021-04-25 DIAGNOSIS — H26491 Other secondary cataract, right eye: Secondary | ICD-10-CM | POA: Diagnosis not present

## 2021-04-25 DIAGNOSIS — Z0100 Encounter for examination of eyes and vision without abnormal findings: Secondary | ICD-10-CM | POA: Diagnosis not present

## 2021-04-25 DIAGNOSIS — E7800 Pure hypercholesterolemia, unspecified: Secondary | ICD-10-CM | POA: Diagnosis not present

## 2021-05-01 ENCOUNTER — Encounter: Payer: Self-pay | Admitting: Cardiology

## 2021-05-01 NOTE — Progress Notes (Signed)
Remote pacemaker transmission.   

## 2021-05-15 DIAGNOSIS — I480 Paroxysmal atrial fibrillation: Secondary | ICD-10-CM | POA: Diagnosis not present

## 2021-05-15 DIAGNOSIS — K219 Gastro-esophageal reflux disease without esophagitis: Secondary | ICD-10-CM | POA: Diagnosis not present

## 2021-05-15 DIAGNOSIS — E039 Hypothyroidism, unspecified: Secondary | ICD-10-CM | POA: Diagnosis not present

## 2021-05-15 DIAGNOSIS — Z950 Presence of cardiac pacemaker: Secondary | ICD-10-CM | POA: Diagnosis not present

## 2021-05-15 DIAGNOSIS — R5383 Other fatigue: Secondary | ICD-10-CM | POA: Diagnosis not present

## 2021-05-15 DIAGNOSIS — Z1389 Encounter for screening for other disorder: Secondary | ICD-10-CM | POA: Diagnosis not present

## 2021-05-15 DIAGNOSIS — Z0000 Encounter for general adult medical examination without abnormal findings: Secondary | ICD-10-CM | POA: Diagnosis not present

## 2021-05-15 DIAGNOSIS — I10 Essential (primary) hypertension: Secondary | ICD-10-CM | POA: Diagnosis not present

## 2021-05-15 DIAGNOSIS — D6869 Other thrombophilia: Secondary | ICD-10-CM | POA: Diagnosis not present

## 2021-05-15 DIAGNOSIS — Z79899 Other long term (current) drug therapy: Secondary | ICD-10-CM | POA: Diagnosis not present

## 2021-06-14 DIAGNOSIS — Z79899 Other long term (current) drug therapy: Secondary | ICD-10-CM | POA: Diagnosis not present

## 2021-07-03 ENCOUNTER — Other Ambulatory Visit: Payer: Self-pay | Admitting: Cardiology

## 2021-07-03 DIAGNOSIS — I10 Essential (primary) hypertension: Secondary | ICD-10-CM | POA: Diagnosis not present

## 2021-07-03 DIAGNOSIS — Z79899 Other long term (current) drug therapy: Secondary | ICD-10-CM | POA: Diagnosis not present

## 2021-07-03 DIAGNOSIS — E7800 Pure hypercholesterolemia, unspecified: Secondary | ICD-10-CM | POA: Diagnosis not present

## 2021-07-03 DIAGNOSIS — E039 Hypothyroidism, unspecified: Secondary | ICD-10-CM | POA: Diagnosis not present

## 2021-07-03 DIAGNOSIS — K219 Gastro-esophageal reflux disease without esophagitis: Secondary | ICD-10-CM | POA: Diagnosis not present

## 2021-07-03 DIAGNOSIS — M8580 Other specified disorders of bone density and structure, unspecified site: Secondary | ICD-10-CM | POA: Diagnosis not present

## 2021-07-03 DIAGNOSIS — I480 Paroxysmal atrial fibrillation: Secondary | ICD-10-CM | POA: Diagnosis not present

## 2021-07-04 ENCOUNTER — Encounter: Payer: Self-pay | Admitting: Cardiology

## 2021-07-04 ENCOUNTER — Ambulatory Visit (INDEPENDENT_AMBULATORY_CARE_PROVIDER_SITE_OTHER): Payer: Medicare HMO | Admitting: Cardiology

## 2021-07-04 VITALS — BP 152/66 | HR 60 | Ht 64.0 in | Wt 138.4 lb

## 2021-07-04 DIAGNOSIS — I471 Supraventricular tachycardia: Secondary | ICD-10-CM

## 2021-07-04 DIAGNOSIS — I480 Paroxysmal atrial fibrillation: Secondary | ICD-10-CM | POA: Diagnosis not present

## 2021-07-04 DIAGNOSIS — I10 Essential (primary) hypertension: Secondary | ICD-10-CM | POA: Diagnosis not present

## 2021-07-04 DIAGNOSIS — E785 Hyperlipidemia, unspecified: Secondary | ICD-10-CM | POA: Diagnosis not present

## 2021-07-04 DIAGNOSIS — I422 Other hypertrophic cardiomyopathy: Secondary | ICD-10-CM

## 2021-07-04 NOTE — Patient Instructions (Signed)
Medication Instructions:  Your physician recommends that you continue on your current medications as directed. Please refer to the Current Medication list given to you today.  *If you need a refill on your cardiac medications before your next appointment, please call your pharmacy*  Follow-Up: At [facility], you and your health needs are our priority.  As part of our continuing mission to provide you with exceptional heart care, we have created designated Provider Care Teams.  These Care Teams include your primary Cardiologist (physician) and Advanced Practice Providers (APPs -  Physician Assistants and Nurse Practitioners) who all work together to provide you with the care you need, when you need it.  We recommend signing up for the patient portal called "MyChart".  Sign up information is provided on this After Visit Summary.  MyChart is used to connect with patients for Virtual Visits (Telemedicine).  Patients are able to view lab/test results, encounter notes, upcoming appointments, etc.  Non-urgent messages can be sent to your provider as well.   To learn more about what you can do with MyChart, go to [email or web address].    Your next appointment:   6 month(s)  The format for your next appointment:   In Person  Provider:   Dr. Schumann  Important Information About Sugar       

## 2021-07-04 NOTE — Progress Notes (Signed)
Cardiology Office Note:    Date:  07/04/2021   ID:  Paula [name], DOB [birth year], MRN [id number]  PCP:  Lajean Manes, MD  Cardiologist:  None  Electrophysiologist:  Vickie Epley, MD   Referring MD: Lajean Manes, MD   Chief Complaint  Patient presents with   Cardiomyopathy    History of Present Illness:    Paula [name] is a 86 y.o. female with a hx of paroxysmal atrial fibrillation, tachybrady syndrome status post PPM, hypertension, hypothyroidism who presents for follow-up.  She was referred by [name], PA for evaluation of hypertrophic cardiomyopathy, initially seen on 06/14/2019.  Diagnosed with atrial fibrillation after presenting with palpitations to PCP office in April. EKG showed A. fib with RVR. Started on Eliquis given CHA2DS2-VASc score 5.  Also started on diltiazem for rate control. She was seen in AF clinic on 05/11/2019, she was noted to be in normal sinus rhythm. She was continued on Eliquis 5 mg twice daily and diltiazem 180 mg daily. TTE on 05/27/19 was concerning for apical variant hypertrophic cardiomyopathy, EF 60 to 65%, normal RV function, moderate elevation in RVSP (48 mmHg), mild mitral regurgitation, mild to moderate tricuspid regurgitation.  Cardiac MRI on 07/05/2019 showed LV apical hypertrophy measuring up to 15 mm, consistent with apical hypertrophic cardiomyopathy, small apical aneurysm, patchy apical LGE accounting for 1% of total myocardial mass, hyperdynamic LV systolic function, normal RV function, mild MR (regurgitant fraction 24%).  Zio patch x7 days on 07/14/2019 showed AF burden 47%, with episode lasting 3 days and average rate 118 bpm, no NSVT, frequent episodes of SVT, longest lasting 19 seconds.  She was admitted in June 2022 with near syncope, underwent PPM for tachybrady syndrome.  Since last clinic visit, she reports that she has been doing okay.  Denies any chest pain, lower extremity edema, or palpitations.  Does report  she has some shortness of breath with exertion.  Had episode of lightheadedness 2 months ago but no syncope.  Reports BP has been 120s over 60s at home.  Taking Eliquis, denies any bleeding issues.  Past Medical History:  Diagnosis Date   CHF (congestive heart failure) (HCC)    Hypothyroidism    Irregular heart rate    Thyroid disease     Past Surgical History:  Procedure Laterality Date   PACEMAKER IMPLANT N/A 07/17/2020   Procedure: PACEMAKER IMPLANT;  Surgeon: Evans Lance, MD;  Location: [facility];  Service: Cardiovascular;  Laterality: N/A;    Current Medications: Current Meds  Medication Sig   alendronate (FOSAMAX) 70 MG tablet Take 70 mg by mouth once a week. Sunday   amiodarone (PACERONE) 200 MG tablet Take one tablet by mouth daily EXCEPT Sunday.   calcium carbonate (OS-CAL - DOSED IN MG OF ELEMENTAL CALCIUM) 1250 (500 Ca) MG tablet Take 1 tablet by mouth daily with breakfast.   diltiazem (CARDIZEM CD) 300 MG 24 hr capsule TAKE 1 CAPSULE EVERY DAY   ELIQUIS 5 MG TABS tablet Take 1 tablet (5 mg total) by mouth in the morning and at bedtime.   famotidine (PEPCID) 10 MG tablet Take 10 mg by mouth at bedtime.   levothyroxine (SYNTHROID, LEVOTHROID) 88 MCG tablet Take 88 mcg by mouth daily before breakfast.   losartan (COZAAR) 100 MG tablet Take 1 tablet (100 mg total) by mouth daily.   Multiple Vitamin (MULTIVITAMIN WITH MINERALS) TABS tablet Take 1 tablet by mouth daily.   prazosin (MINIPRESS) 1 MG capsule TAKE 1 CAPSULE  AT BEDTIME   rosuvastatin (CRESTOR) 5 MG tablet Take 1 tablet (5 mg total) by mouth daily.   sodium chloride (OCEAN) 0.65 % SOLN nasal spray Place 1 spray into both nostrils as needed for congestion.     Allergies:   Wound dressing adhesive   Social History   Socioeconomic History   Marital status: Married    Spouse name: Not on file   Number of children: Not on file   Years of education: Not on file   Highest education level: Not on file   Occupational History   Not on file  Tobacco Use   Smoking status: Never   Smokeless tobacco: Never  Vaping Use   Vaping Use: Never used  Substance and Sexual Activity   Alcohol use: Yes    Alcohol/week: 1.0 standard drink of alcohol    Types: 1 Glasses of wine per week   Drug use: No   Sexual activity: Not Currently    Birth control/protection: Post-menopausal  Other Topics Concern   Not on file  Social History Narrative   Not on file   Social Determinants of Health   Financial Resource Strain: Not on file  Food Insecurity: Not on file  Transportation Needs: Not on file  Physical Activity: Not on file  Stress: Not on file  Social Connections: Not on file     Family History: The patient's family history includes Breast cancer (age of onset: 32) in her sister.  ROS:   Please see the history of present illness.     All other systems reviewed and are negative.  EKGs/Labs/Other Studies Reviewed:    The following studies were reviewed today:  Monitor 05/01/2020: No significant abnormalities   Patch Wear Time:  2 days and 23 hours (2022-04-01T16:38:40-0400 to 2022-04-04T16:10:58-0400)   Patient had a min HR of 35 bpm, max HR of 119 bpm, and avg HR of 61 bpm. Predominant underlying rhythm was Sinus Rhythm. 2 Supraventricular Tachycardia runs occurred, the run with the fastest interval lasting 4 beats with a max rate of 119 bpm, the longest lasting 4 beats with an avg rate of 98 bpm. Isolated SVEs were rare (<1.0%), SVE Couplets were rare (<1.0%), and SVE Triplets were rare (<1.0%). Isolated VEs were rare (<1.0%), and no VE Couplets or VE Triplets were present.  No patient triggered events  Monitor 07/14/2019: Episode of atrial fibrillation lasting 3 days 6 hours, average rate 118 bpm. AF burden 47%. 141 episodes of SVT, longest lasting 18.5 seconds.   7 days of data recorded on Zio monitor. Patient had a min HR of 55 bpm, max HR of 207 bpm, and avg HR of 95 bpm.  Predominant underlying rhythm was Sinus Rhythm. No VT, high degree block, or pauses noted.  141 episodes of SVT, longest lasting 18.5 seconds.  Episode of atrial fibrillation lasting 3 days 6 hours, average rate 118 bpm.  AF burden 47%.   Isolated atrial and ventricular ectopy was rare (<1%). There were 0 triggered events.  CMR 07/05/2019: 1. LV hypertrophy at apex measuring up to 75m, consistent with apical hypertrophic cardiomyopathy   2.  Small apical aneurysm   3. Patchy LGE at apex, consistent with apical HCM. LGE accounts for 1% of total myocardial mass   4.  Normal LV size with hyperdynamic systolic function (EF 785%   5.  Normal RV size and systolic function (EF 527%   6. Mild mitral regurgitation (regurgitant volume 16cc, regurgitant fraction 24%)  Echo 05/27/2019: 1. Apical variant  hypertrophic cardiomyopathy with no evidence of apical  thrombus. Left ventricular ejection fraction, by estimation, is 60 to 65%.  The left ventricle has normal function. The left ventricle demonstrates  regional wall motion  abnormalities (see scoring diagram/findings for description). There is  severe left ventricular hypertrophy of the apical segment. Left  ventricular diastolic parameters are consistent with Grade I diastolic  dysfunction (impaired relaxation).   2. Right ventricular systolic function is normal. The right ventricular  size is normal. There is moderately elevated pulmonary artery systolic  pressure. The estimated right ventricular systolic pressure is 65.4 mmHg.   3. Left atrial size was mildly dilated.   4. The mitral valve is normal in structure. Mild mitral valve  regurgitation. No evidence of mitral stenosis.   5. Tricuspid valve regurgitation is mild to moderate.   6. Possible fusion of right and non coronary cusp. The aortic valve is  normal in structure. Aortic valve regurgitation is not visualized. Mild to  moderate aortic valve sclerosis/calcification is present,  without any  evidence of aortic stenosis.   7. The inferior vena cava is normal in size with greater than 50%  respiratory variability, suggesting right atrial pressure of 3 mmHg.  Exercise Myoview 07/12/2016: Nuclear stress EF: 64%. Blood pressure demonstrated a normal response to exercise. Horizontal ST segment depression ST segment depression was noted during stress in the III, aVF, V4, V5 and V6 leads, and returning to baseline after 1-5 minutes of recovery. Findings are nonspecific given baseline T wave inversions. The study is normal. This is a low risk study. The left ventricular ejection fraction is normal (55-65%).   EKG:   07/04/2021: Atrial paced, rate 60, Q waves in V1/2, nonspecific T wave flattening 12/27/20: Atrial paced, rate 60, Q waves in V1/2, no ST abnormalities 07/10/2020: EKG is not ordered today. 04/14/2020: sinus rhythm, rate 74, LVH with repolarization abnormalities  Recent Labs: 07/18/2020: Hemoglobin 10.8; Platelets 268 12/27/2020: BUN 12; Creatinine, Ser 1.15; Potassium 4.5; Sodium 133; TSH 1.770 03/20/2021: ALT 34  Recent Lipid Panel    Component Value Date/Time   CHOL 174 03/20/2021 0809   TRIG 79 03/20/2021 0809   HDL 93 03/20/2021 0809   CHOLHDL 1.9 03/20/2021 0809   LDLCALC 66 03/20/2021 0809    Physical Exam:    VS:  BP (!) 152/66 (BP Location: Left Arm, Patient Position: Sitting, Cuff Size: Normal)   Pulse 60   Ht '5\' 4"'$  (1.626 m)   Wt 138 lb 6.4 oz (62.8 kg)   SpO2 97%   BMI 23.76 kg/m     Wt Readings from Last 3 Encounters:  07/04/21 138 lb 6.4 oz (62.8 kg)  12/27/20 137 lb 6.4 oz (62.3 kg)  10/24/20 139 lb (63 kg)     GEN: in no acute distress HEENT: Normal NECK: No JVD; No carotid bruits CARDIAC: RRR, 2/6 systolic murmur RESPIRATORY:  Clear to auscultation without rales, wheezing or rhonchi  ABDOMEN: Soft, non-tender, non-distended MUSCULOSKELETAL:  No edema; No deformity  SKIN: Warm and dry NEUROLOGIC:  Alert and oriented x  3 PSYCHIATRIC:  Normal affect   ASSESSMENT:    1. Hypertrophic cardiomyopathy (HCC)   2. Paroxysmal atrial fibrillation ([place])   3. SVT (supraventricular tachycardia) ([place])   4. Essential hypertension   5. Hyperlipidemia, unspecified hyperlipidemia type       PLAN:    Hypertrophic cardiomyopathy: cardiac MRI on 07/05/2019 showed LV apical hypertrophy measuring up to 15 mm, consistent with apical hypertrophic cardiomyopathy, small apical aneurysm, patchy  apical LGE accounting for 1% of total myocardial mass.  No NSVT on Zio patch x 7 days -Recommend first degree relatives be screened, reports her son underwent screening echocardiogram  Paroxysmal atrial fibrillation: Diagnosed 04/2019 after presenting to PCPs office with palpitations. CHA2DS2-VASc score 5.  Zio patch x7 days on 07/14/2019 showed AF burden 47%, with episode lasting 3 days and average rate 118 bpm.  She was admitted in June 2022 with near syncope, underwent PPM for tachybrady syndrome. -Continue Eliquis 5 mg twice daily -Continue amiodarone 200 mg daily.  Had mild transaminitis on labs in 12/2020, normalized 02/2021.  Normal TSH 12/2020 -Continue diltiazem 300 mg daily  SVT: Frequent episodes on ZIO monitoring, longest lasting 19 seconds.  Continue diltiazem  Hypertension: On losartan 100 mg daily, prazosin 1 mg nightly, diltiazem 300 mg daily.  Elevated in clinic today but reports has been under good control at home  Hyperlipidemia: On rosuvastatin 5 mg daily, LDL 66 on 03/20/2021   RTC in 6 months   Medication Adjustments/Labs and Tests Ordered: Current medicines are reviewed at length with the patient today.  Concerns regarding medicines are outlined above.  Orders Placed This Encounter  Procedures   EKG 12-Lead    No orders of the defined types were placed in this encounter.     Patient Instructions  Medication Instructions:  Your physician recommends that you continue on your current medications as  directed. Please refer to the Current Medication list given to you today.  *If you need a refill on your cardiac medications before your next appointment, please call your pharmacy*  Follow-Up: At [facility], you and your health needs are our priority.  As part of our continuing mission to provide you with exceptional heart care, we have created designated Provider Care Teams.  These Care Teams include your primary Cardiologist (physician) and Advanced Practice Providers (APPs -  Physician Assistants and Nurse Practitioners) who all work together to provide you with the care you need, when you need it.  We recommend signing up for the patient portal called "MyChart".  Sign up information is provided on this After Visit Summary.  MyChart is used to connect with patients for Virtual Visits (Telemedicine).  Patients are able to view lab/test results, encounter notes, upcoming appointments, etc.  Non-urgent messages can be sent to your provider as well.   To learn more about what you can do with MyChart, go to [email or web address].    Your next appointment:   6 month(s)  The format for your next appointment:   In Person  Provider:   Dr. Gardiner Rhyme   Important Information About Sugar         I,Mathew [place] as a scribe for Donato Heinz, MD.,have documented all relevant documentation on the behalf of Donato Heinz, MD,as directed by  Donato Heinz, MD while in the presence of Donato Heinz, MD.   I, Donato Heinz, MD, have reviewed all documentation for this visit. The documentation on 07/04/21 for the exam, diagnosis, procedures, and orders are all accurate and complete.   Signed, Donato Heinz, MD  07/04/2021 12:30 PM    [place]

## 2021-07-19 ENCOUNTER — Ambulatory Visit (INDEPENDENT_AMBULATORY_CARE_PROVIDER_SITE_OTHER): Payer: Medicare HMO

## 2021-07-19 DIAGNOSIS — I422 Other hypertrophic cardiomyopathy: Secondary | ICD-10-CM | POA: Diagnosis not present

## 2021-07-19 DIAGNOSIS — I471 Supraventricular tachycardia: Secondary | ICD-10-CM

## 2021-07-19 LAB — CUP PACEART REMOTE DEVICE CHECK
Battery Remaining Longevity: 86 mo
Battery Remaining Percentage: 94 %
Battery Voltage: 3.02 V
Brady Statistic AP VP Percent: 1 %
Brady Statistic AP VS Percent: 65 %
Brady Statistic AS VP Percent: 1 %
Brady Statistic AS VS Percent: 34 %
Brady Statistic RA Percent Paced: 65 %
Brady Statistic RV Percent Paced: 1 %
Date Time Interrogation Session: 20230629031030
Implantable Lead Implant Date: 20220627
Implantable Lead Implant Date: 20220627
Implantable Lead Location: 753859
Implantable Lead Location: 753860
Implantable Pulse Generator Implant Date: 20220627
Lead Channel Impedance Value: 430 Ohm
Lead Channel Impedance Value: 550 Ohm
Lead Channel Pacing Threshold Amplitude: 0.75 V
Lead Channel Pacing Threshold Amplitude: 1.375 V
Lead Channel Pacing Threshold Pulse Width: 0.5 ms
Lead Channel Pacing Threshold Pulse Width: 0.5 ms
Lead Channel Sensing Intrinsic Amplitude: 12 mV
Lead Channel Sensing Intrinsic Amplitude: 3.6 mV
Lead Channel Setting Pacing Amplitude: 2.375
Lead Channel Setting Pacing Amplitude: 2.5 V
Lead Channel Setting Pacing Pulse Width: 0.5 ms
Lead Channel Setting Sensing Sensitivity: 2 mV
Pulse Gen Model: 2272
Pulse Gen Serial Number: 3933448

## 2021-08-03 NOTE — Progress Notes (Signed)
Remote pacemaker transmission.   

## 2021-08-06 DIAGNOSIS — R051 Acute cough: Secondary | ICD-10-CM | POA: Diagnosis not present

## 2021-08-06 DIAGNOSIS — E7800 Pure hypercholesterolemia, unspecified: Secondary | ICD-10-CM | POA: Diagnosis not present

## 2021-08-06 DIAGNOSIS — I10 Essential (primary) hypertension: Secondary | ICD-10-CM | POA: Diagnosis not present

## 2021-08-06 DIAGNOSIS — I700 Atherosclerosis of aorta: Secondary | ICD-10-CM | POA: Diagnosis not present

## 2021-08-06 DIAGNOSIS — K219 Gastro-esophageal reflux disease without esophagitis: Secondary | ICD-10-CM | POA: Diagnosis not present

## 2021-08-15 ENCOUNTER — Other Ambulatory Visit: Payer: Self-pay | Admitting: Cardiology

## 2021-08-28 DIAGNOSIS — D239 Other benign neoplasm of skin, unspecified: Secondary | ICD-10-CM | POA: Diagnosis not present

## 2021-08-28 DIAGNOSIS — Z85828 Personal history of other malignant neoplasm of skin: Secondary | ICD-10-CM | POA: Diagnosis not present

## 2021-08-28 DIAGNOSIS — Z1283 Encounter for screening for malignant neoplasm of skin: Secondary | ICD-10-CM | POA: Diagnosis not present

## 2021-08-28 DIAGNOSIS — L570 Actinic keratosis: Secondary | ICD-10-CM | POA: Diagnosis not present

## 2021-09-14 ENCOUNTER — Telehealth: Payer: Self-pay | Admitting: Cardiology

## 2021-09-14 NOTE — Telephone Encounter (Signed)
Pt is requesting call back to get a referral for a pulmonologist per Dr. Newman Nickels recommendation at her last visit. She states her cough has not gotten better and would like to go ahead and follow recommendation.

## 2021-09-14 NOTE — Telephone Encounter (Signed)
Spoke to patient-patient reports discussing ongoing cough with Dr. Gardiner Rhyme at last visit and was going to make a pulmonology referral but she decided to wait.   Cough has continued and she would like to see pulmonology for this.

## 2021-09-17 ENCOUNTER — Encounter: Payer: Self-pay | Admitting: Cardiology

## 2021-09-17 DIAGNOSIS — R053 Chronic cough: Secondary | ICD-10-CM

## 2021-09-17 NOTE — Telephone Encounter (Signed)
See my chart message

## 2021-09-17 NOTE — Telephone Encounter (Signed)
Yes lets refer to pulmonology

## 2021-10-09 NOTE — Progress Notes (Deleted)
Synopsis: Referred in September 2023 for chronic cough.  She has a history of hypertrophic cardiomyopathy as well as atrial fibrillation and has been treated with amiodarone.  Subjective:   PATIENT ID: Paula [name] GENDER: female DOB: [birth year], MRN: [id number]   HPI  No chief complaint on file.   *** Record review: June 2023 cardiology records reviewed where the patient was seen for LVH as well as atrial fibrillation treated with 200 mg of amiodarone daily.  Past Medical History:  Diagnosis Date   CHF (congestive heart failure) (HCC)    Hypothyroidism    Irregular heart rate    Thyroid disease      Family History  Problem Relation Age of Onset   Breast cancer Sister 42     Social History   Socioeconomic History   Marital status: Married    Spouse name: Not on file   Number of children: Not on file   Years of education: Not on file   Highest education level: Not on file  Occupational History   Not on file  Tobacco Use   Smoking status: Never   Smokeless tobacco: Never  Vaping Use   Vaping Use: Never used  Substance and Sexual Activity   Alcohol use: Yes    Alcohol/week: 1.0 standard drink of alcohol    Types: 1 Glasses of wine per week   Drug use: No   Sexual activity: Not Currently    Birth control/protection: Post-menopausal  Other Topics Concern   Not on file  Social History Narrative   Not on file   Social Determinants of Health   Financial Resource Strain: Not on file  Food Insecurity: Not on file  Transportation Needs: Not on file  Physical Activity: Not on file  Stress: Not on file  Social Connections: Not on file  Intimate Partner Violence: Not on file     Allergies  Allergen Reactions   Wound Dressing Adhesive     Other reaction(s): rash     Outpatient Medications Prior to Visit  Medication Sig Dispense Refill   acetaminophen (TYLENOL) 325 MG tablet Take 2 tablets (650 mg total) by mouth every 4 (four) hours as needed  for headache or mild pain. (Patient not taking: Reported on 07/04/2021)     alendronate (FOSAMAX) 70 MG tablet Take 70 mg by mouth once a week. Sunday     amiodarone (PACERONE) 200 MG tablet Take one tablet by mouth daily EXCEPT Sunday. 90 tablet 3   calcium carbonate (OS-CAL - DOSED IN MG OF ELEMENTAL CALCIUM) 1250 (500 Ca) MG tablet Take 1 tablet by mouth daily with breakfast.     diltiazem (CARDIZEM CD) 300 MG 24 hr capsule TAKE 1 CAPSULE EVERY DAY 90 capsule 3   ELIQUIS 5 MG TABS tablet Take 1 tablet (5 mg total) by mouth in the morning and at bedtime. 60 tablet    famotidine (PEPCID) 10 MG tablet Take 10 mg by mouth at bedtime.     levothyroxine (SYNTHROID, LEVOTHROID) 88 MCG tablet Take 88 mcg by mouth daily before breakfast.     losartan (COZAAR) 100 MG tablet Take 1 tablet (100 mg total) by mouth daily. 90 tablet 3   Multiple Vitamin (MULTIVITAMIN WITH MINERALS) TABS tablet Take 1 tablet by mouth daily.     polyethylene glycol powder (MIRALAX) 17 GM/SCOOP powder Take by mouth as needed. (Patient not taking: Reported on 07/04/2021)     prazosin (MINIPRESS) 1 MG capsule TAKE 1 CAPSULE AT BEDTIME 90 capsule  3   rosuvastatin (CRESTOR) 5 MG tablet Take 1 tablet (5 mg total) by mouth daily. 90 tablet 3   sodium chloride (OCEAN) 0.65 % SOLN nasal spray Place 1 spray into both nostrils as needed for congestion.     No facility-administered medications prior to visit.    ROS    Objective:  Physical Exam   There were no vitals filed for this visit.  ***  CBC    Component Value Date/Time   WBC 8.0 07/18/2020 0211   RBC 3.41 (L) 07/18/2020 0211   HGB 10.8 (L) 07/18/2020 0211   HCT 32.9 (L) 07/18/2020 0211   PLT 268 07/18/2020 0211   MCV 96.5 07/18/2020 0211   MCH 31.7 07/18/2020 0211   MCHC 32.8 07/18/2020 0211   RDW 14.0 07/18/2020 0211     Chest imaging: June 2022 chest x-ray showed enlarged cardiac silhouette, no pulmonary parenchymal abnormality, pacemaker in place, trace  bilateral pleural effusions.  PFT: April 2022 pulmonary function test ratio 82%, FVC 2.22 L 87% predicted, total lung capacity 4.08 L 80% predicted, DLCO 16.79 mL/min/mmHg 89% predicted  Labs: June 2022 hemoglobin 10.8 g/day  Path:  Echo:  Heart Catheterization:       Assessment & Plan:   No diagnosis found.  Discussion: ***  Immunizations: Immunization History  Administered Date(s) Administered   Influenza Split 10/27/2008, 12/06/2010, 11/06/2011, 10/20/2012, 10/19/2013, 11/07/2014, 10/19/2015, 10/14/2017, 10/13/2018   Influenza, High Dose Seasonal PF 10/07/2016, 10/14/2017, 10/13/2018, 10/08/2019   Moderna Sars-Covid-2 Vaccination 02/17/2019, 03/24/2019, 11/17/2019   Pneumococcal Conjugate-13 01/19/2014   Pneumococcal Polysaccharide-23 05/27/2002, 03/14/2016   Td 12/26/2005   Tdap 01/30/2015   Zoster, Live 05/28/2006, 08/09/2016, 10/25/2016     Current Outpatient Medications:    acetaminophen (TYLENOL) 325 MG tablet, Take 2 tablets (650 mg total) by mouth every 4 (four) hours as needed for headache or mild pain. (Patient not taking: Reported on 07/04/2021), Disp: , Rfl:    alendronate (FOSAMAX) 70 MG tablet, Take 70 mg by mouth once a week. Sunday, Disp: , Rfl:    amiodarone (PACERONE) 200 MG tablet, Take one tablet by mouth daily EXCEPT Sunday., Disp: 90 tablet, Rfl: 3   calcium carbonate (OS-CAL - DOSED IN MG OF ELEMENTAL CALCIUM) 1250 (500 Ca) MG tablet, Take 1 tablet by mouth daily with breakfast., Disp: , Rfl:    diltiazem (CARDIZEM CD) 300 MG 24 hr capsule, TAKE 1 CAPSULE EVERY DAY, Disp: 90 capsule, Rfl: 3   ELIQUIS 5 MG TABS tablet, Take 1 tablet (5 mg total) by mouth in the morning and at bedtime., Disp: 60 tablet, Rfl:    famotidine (PEPCID) 10 MG tablet, Take 10 mg by mouth at bedtime., Disp: , Rfl:    levothyroxine (SYNTHROID, LEVOTHROID) 88 MCG tablet, Take 88 mcg by mouth daily before breakfast., Disp: , Rfl:    losartan (COZAAR) 100 MG tablet, Take 1  tablet (100 mg total) by mouth daily., Disp: 90 tablet, Rfl: 3   Multiple Vitamin (MULTIVITAMIN WITH MINERALS) TABS tablet, Take 1 tablet by mouth daily., Disp: , Rfl:    polyethylene glycol powder (MIRALAX) 17 GM/SCOOP powder, Take by mouth as needed. (Patient not taking: Reported on 07/04/2021), Disp: , Rfl:    prazosin (MINIPRESS) 1 MG capsule, TAKE 1 CAPSULE AT BEDTIME, Disp: 90 capsule, Rfl: 3   rosuvastatin (CRESTOR) 5 MG tablet, Take 1 tablet (5 mg total) by mouth daily., Disp: 90 tablet, Rfl: 3   sodium chloride (OCEAN) 0.65 % SOLN nasal spray, Place 1 spray  into both nostrils as needed for congestion., Disp: , Rfl:

## 2021-10-12 ENCOUNTER — Institutional Professional Consult (permissible substitution): Payer: Medicare HMO | Admitting: Pulmonary Disease

## 2021-10-16 DIAGNOSIS — I10 Essential (primary) hypertension: Secondary | ICD-10-CM | POA: Diagnosis not present

## 2021-10-16 DIAGNOSIS — Z79899 Other long term (current) drug therapy: Secondary | ICD-10-CM | POA: Diagnosis not present

## 2021-10-17 ENCOUNTER — Encounter: Payer: Self-pay | Admitting: Pulmonary Disease

## 2021-10-17 ENCOUNTER — Ambulatory Visit (INDEPENDENT_AMBULATORY_CARE_PROVIDER_SITE_OTHER): Payer: Medicare HMO

## 2021-10-17 ENCOUNTER — Ambulatory Visit: Payer: Medicare HMO | Admitting: Pulmonary Disease

## 2021-10-17 VITALS — BP 120/60 | HR 61 | Ht 64.0 in | Wt 138.2 lb

## 2021-10-17 DIAGNOSIS — R053 Chronic cough: Secondary | ICD-10-CM | POA: Diagnosis not present

## 2021-10-17 MED ORDER — FLUTICASONE PROPIONATE 50 MCG/ACT NA SUSP: 1.0000 | Freq: Every evening | NASAL | 2 refills | Status: DC

## 2021-10-17 MED ORDER — AZELASTINE HCL 0.15 % NA SOLN: 1.0000 | Freq: Every morning | NASAL | 1 refills | Status: DC

## 2021-10-17 NOTE — Patient Instructions (Signed)
Chest xray today.  Astepro 1 spray in each nostril in the morning.  Flonase 1 spray in each nostril in the evening.  Continue doing saline sinus rinse.  Follow up in 4 weeks with Dr. Halford Chessman or a Nurse Practitioner.

## 2021-10-17 NOTE — Progress Notes (Signed)
[facility]  Chief Complaint  Patient presents with   Cough    Past Surgical History:  She  has a past surgical history that includes PACEMAKER IMPLANT (N/A, 07/17/2020).  Past Medical History:  CHF, Hypothyroidism, HLD, Hypertrophic CM, PAF, SVT, s/p PM  Constitutional:  BP 120/60 (BP Location: Right Arm)   Pulse 61   Ht '5\' 4"'$  (1.626 m)   Wt 138 lb 3.2 oz (62.7 kg)   SpO2 97%   BMI 23.72 kg/m   Brief Summary:  Paula [name] is a 86 y.o. female with chronic cough.      Subjective:   She is here with her husband.  She has noticed a cough for a while.  She has sinus congestion and drainage in the back of her throat.  Her cough gets worse when she lays down.  She has sinus trouble when she goes outside.  She has been using saline sinus rinse and this helps some.  She was also having reflux and belching.  Her PCP started her on pepcid and this helped.    She never smoked cigarettes.  Her mother had asthma.  No history of pneumonia.  She had COVID about 2 weeks ago and was treated with molnupiravir.  She doesn't feel like her breathing limits her activity level.  Her husband says her breathing gets shallow sometimes at night, but she doesn't snore.  Chest xray from 07/18/20 showed small effusions.  She wasn't able to do FeNO testing maneuver today.  Physical Exam:   Appearance - well kempt   ENMT - no sinus tenderness, no oral exudate, no LAN, Mallampati 3 airway, no stridor  Respiratory - equal breath sounds bilaterally, no wheezing or rales  CV - s1s2 regular rate and rhythm, no murmurs  Ext - no clubbing, no edema  Skin - no rashes  Psych - normal mood and affect   Pulmonary testing:  PFT 04/21/20 >> FEV1 1.87 (87%), FEV1 73, TLC 4.08 (80%), DLCO 89%  Chest Imaging:    Sleep Tests:    Cardiac Tests:  Echo 05/27/19 >> EF 60 to 65%, severe LVH, grade 1 DD, RVSP 47.9 mmHg, mild/mod TR  Social History:   She  reports that she has never smoked. She has never used smokeless tobacco. She reports current alcohol use of about 1.0 standard drink of alcohol per week. She reports that she does not use drugs.  Family History:  Her family history includes Breast cancer (age of onset: 69) in her sister.    Discussion:  She has chronic cough.  No history of smoking.  Her symptoms are suggestive of upper airway cough with post nasal drip and laryngopharyngeal reflux.  Assessment/Plan:   Chronic cough. - will order chest xray - if cough doesn't improve, then consider repeating PFT and trying inhaler therapy for asthma  Upper airway cough syndrome. - will have her use astepro in the morning and flonase at night - continue nasal irrigation  Laryngopharyngeal reflux. - pepcid prescribed by her PCP  PAF, Hypertrophic CM. - followed by Dr. Oswaldo Milian, and Dr. Cristopher Peru with cardiology  Time Spent Involved in Patient Care on Day of Examination:  51 minutes  Follow up:   Patient Instructions  Chest xray today.  Astepro 1 spray in each nostril in the morning.  Flonase 1 spray in each nostril in the evening.  Continue doing saline sinus rinse.  Follow up in 4 weeks with Dr. Halford Chessman  or a Designer, jewellery.  Medication List:   Allergies as of 10/17/2021       Reactions   Wound Dressing Adhesive    Other reaction(s): rash        Medication List        Accurate as of October 17, 2021  2:33 PM. If you have any questions, ask your nurse or doctor.          STOP taking these medications    alendronate 70 MG tablet Commonly known as: FOSAMAX Stopped by: Chesley Mires, MD       TAKE these medications    acetaminophen 325 MG tablet Commonly known as: TYLENOL Take 2 tablets (650 mg total) by mouth every 4 (four) hours as needed for headache or mild pain.   amiodarone 200 MG tablet Commonly known as: PACERONE Take one tablet by mouth daily EXCEPT Sunday.    Azelastine HCl 0.15 % Soln Commonly known as: Astepro Place 1 spray into the nose every morning. Started by: Chesley Mires, MD   calcium carbonate 1250 (500 Ca) MG tablet Commonly known as: OS-CAL - dosed in mg of elemental calcium Take 1 tablet by mouth daily with breakfast.   diltiazem 300 MG 24 hr capsule Commonly known as: CARDIZEM CD TAKE 1 CAPSULE EVERY DAY   Eliquis 5 MG Tabs tablet Generic drug: apixaban Take 1 tablet (5 mg total) by mouth in the morning and at bedtime.   famotidine 10 MG tablet Commonly known as: PEPCID Take 10 mg by mouth at bedtime.   fluticasone 50 MCG/ACT nasal spray Commonly known as: FLONASE Place 1 spray into both nostrils at bedtime. Started by: Chesley Mires, MD   levothyroxine 88 MCG tablet Commonly known as: SYNTHROID Take 88 mcg by mouth daily before breakfast.   losartan 100 MG tablet Commonly known as: COZAAR Take 1 tablet (100 mg total) by mouth daily.   MiraLax 17 GM/SCOOP powder Generic drug: polyethylene glycol powder Take by mouth as needed.   multivitamin with minerals Tabs tablet Take 1 tablet by mouth daily.   prazosin 1 MG capsule Commonly known as: MINIPRESS TAKE 1 CAPSULE AT BEDTIME   rosuvastatin 5 MG tablet Commonly known as: CRESTOR Take 1 tablet (5 mg total) by mouth daily.   sodium chloride 0.65 % Soln nasal spray Commonly known as: OCEAN Place 1 spray into both nostrils as needed for congestion.        Signature:  Chesley Mires, MD [place] Pager - [phone] 10/17/2021, 2:33 PM

## 2021-10-18 ENCOUNTER — Ambulatory Visit (INDEPENDENT_AMBULATORY_CARE_PROVIDER_SITE_OTHER): Payer: Medicare HMO

## 2021-10-18 DIAGNOSIS — I422 Other hypertrophic cardiomyopathy: Secondary | ICD-10-CM

## 2021-10-19 LAB — CUP PACEART REMOTE DEVICE CHECK
Battery Remaining Longevity: 104 mo
Battery Remaining Percentage: 92 %
Battery Voltage: 3.02 V
Brady Statistic AP VP Percent: 1 %
Brady Statistic AP VS Percent: 66 %
Brady Statistic AS VP Percent: 1 %
Brady Statistic AS VS Percent: 34 %
Brady Statistic RA Percent Paced: 66 %
Brady Statistic RV Percent Paced: 1 %
Date Time Interrogation Session: 20230928020015
Implantable Lead Implant Date: 20220627
Implantable Lead Implant Date: 20220627
Implantable Lead Location: 753859
Implantable Lead Location: 753860
Implantable Pulse Generator Implant Date: 20220627
Lead Channel Impedance Value: 390 Ohm
Lead Channel Impedance Value: 550 Ohm
Lead Channel Pacing Threshold Amplitude: 0.75 V
Lead Channel Pacing Threshold Amplitude: 1.5 V
Lead Channel Pacing Threshold Pulse Width: 0.5 ms
Lead Channel Pacing Threshold Pulse Width: 0.5 ms
Lead Channel Sensing Intrinsic Amplitude: 3.2 mV
Lead Channel Sensing Intrinsic Amplitude: 8.6 mV
Lead Channel Setting Pacing Amplitude: 2.5 V
Lead Channel Setting Pacing Amplitude: 2.5 V
Lead Channel Setting Pacing Pulse Width: 0.5 ms
Lead Channel Setting Sensing Sensitivity: 2 mV
Pulse Gen Model: 2272
Pulse Gen Serial Number: 3933448

## 2021-10-24 NOTE — Progress Notes (Signed)
Remote pacemaker transmission.   

## 2021-10-27 ENCOUNTER — Other Ambulatory Visit: Payer: Self-pay | Admitting: Cardiology

## 2021-10-27 DIAGNOSIS — E785 Hyperlipidemia, unspecified: Secondary | ICD-10-CM

## 2021-11-14 ENCOUNTER — Other Ambulatory Visit: Payer: Self-pay | Admitting: Internal Medicine

## 2021-11-15 ENCOUNTER — Ambulatory Visit: Payer: Medicare HMO | Admitting: Nurse Practitioner

## 2021-11-29 ENCOUNTER — Other Ambulatory Visit: Payer: Self-pay | Admitting: Internal Medicine

## 2021-11-29 DIAGNOSIS — Z1231 Encounter for screening mammogram for malignant neoplasm of breast: Secondary | ICD-10-CM

## 2021-12-05 ENCOUNTER — Other Ambulatory Visit: Payer: Self-pay | Admitting: Cardiology

## 2021-12-26 ENCOUNTER — Other Ambulatory Visit: Payer: Self-pay | Admitting: Internal Medicine

## 2022-01-08 ENCOUNTER — Ambulatory Visit: Payer: Medicare HMO | Attending: Cardiology | Admitting: Cardiology

## 2022-01-08 ENCOUNTER — Encounter: Payer: Self-pay | Admitting: Cardiology

## 2022-01-08 VITALS — BP 134/62 | HR 60 | Ht 64.0 in | Wt 135.0 lb

## 2022-01-08 DIAGNOSIS — I509 Heart failure, unspecified: Secondary | ICD-10-CM | POA: Diagnosis not present

## 2022-01-08 DIAGNOSIS — I480 Paroxysmal atrial fibrillation: Secondary | ICD-10-CM | POA: Diagnosis not present

## 2022-01-08 DIAGNOSIS — I10 Essential (primary) hypertension: Secondary | ICD-10-CM | POA: Diagnosis not present

## 2022-01-08 DIAGNOSIS — E785 Hyperlipidemia, unspecified: Secondary | ICD-10-CM

## 2022-01-08 DIAGNOSIS — I471 Supraventricular tachycardia, unspecified: Secondary | ICD-10-CM | POA: Diagnosis not present

## 2022-01-08 DIAGNOSIS — I422 Other hypertrophic cardiomyopathy: Secondary | ICD-10-CM | POA: Diagnosis not present

## 2022-01-08 DIAGNOSIS — I110 Hypertensive heart disease with heart failure: Secondary | ICD-10-CM | POA: Diagnosis not present

## 2022-01-08 NOTE — Progress Notes (Signed)
Cardiology Office Note:    Date:  01/08/2022   ID:  Paula [name], DOB October 27, 1935, MRN [id number]  PCP:  Lajean Manes, MD  Cardiologist:  Donato Heinz, MD  Electrophysiologist:  Vickie Epley, MD   Referring MD: Lajean Manes, MD   Chief Complaint  Patient presents with   Follow-up    6 months.   Cardiomyopathy    History of Present Illness:    Paula [name] is a 86 y.o. female with a hx of paroxysmal atrial fibrillation, tachybrady syndrome status post PPM, hypertension, hypothyroidism who presents for follow-up.  She was referred by [name], PA for evaluation of hypertrophic cardiomyopathy, initially seen on 06/14/2019.  Diagnosed with atrial fibrillation after presenting with palpitations to PCP office in April. EKG showed A. fib with RVR. Started on Eliquis given CHA2DS2-VASc score 5.  Also started on diltiazem for rate control. She was seen in AF clinic on 05/11/2019, she was noted to be in normal sinus rhythm. She was continued on Eliquis 5 mg twice daily and diltiazem 180 mg daily. TTE on 05/27/19 was concerning for apical variant hypertrophic cardiomyopathy, EF 60 to 65%, normal RV function, moderate elevation in RVSP (48 mmHg), mild mitral regurgitation, mild to moderate tricuspid regurgitation.  Cardiac MRI on 07/05/2019 showed LV apical hypertrophy measuring up to 15 mm, consistent with apical hypertrophic cardiomyopathy, small apical aneurysm, patchy apical LGE accounting for 1% of total myocardial mass, hyperdynamic LV systolic function, normal RV function, mild MR (regurgitant fraction 24%).  Zio patch x7 days on 07/14/2019 showed AF burden 47%, with episode lasting 3 days and average rate 118 bpm, no NSVT, frequent episodes of SVT, longest lasting 19 seconds.  She was admitted in June 2022 with near syncope, underwent PPM for tachybrady syndrome.  Since last clinic visit, she reports she has been doing okay.  Brought her BP log, has been  well-controlled in the morning but elevated in the afternoon (130s to 160s).  She denies any chest pain, lower extremity edema, or palpitations.  Does report some dyspnea with walking up stairs.  Also reports some lightheadedness in the morning.  She is taking Eliquis, denies any bleeding issues.  Past Medical History:  Diagnosis Date   CHF (congestive heart failure) (HCC)    Hypothyroidism    Irregular heart rate    Thyroid disease     Past Surgical History:  Procedure Laterality Date   PACEMAKER IMPLANT N/A 07/17/2020   Procedure: PACEMAKER IMPLANT;  Surgeon: Evans Lance, MD;  Location: [facility];  Service: Cardiovascular;  Laterality: N/A;    Current Medications: Current Meds  Medication Sig   acetaminophen (TYLENOL) 325 MG tablet Take 2 tablets (650 mg total) by mouth every 4 (four) hours as needed for headache or mild pain.   amiodarone (PACERONE) 200 MG tablet TAKE 1 TABLET EVERY DAY EXCEPT SUNDAY//MUST KEEP UPCOMING APPT FOR FURTHER REFILLS   calcium carbonate (OS-CAL - DOSED IN MG OF ELEMENTAL CALCIUM) 1250 (500 Ca) MG tablet Take 1 tablet by mouth daily with breakfast.   diltiazem (CARDIZEM CD) 300 MG 24 hr capsule TAKE 1 CAPSULE EVERY DAY   ELIQUIS 5 MG TABS tablet Take 1 tablet (5 mg total) by mouth in the morning and at bedtime.   famotidine (PEPCID) 10 MG tablet Take 10 mg by mouth at bedtime.   fluticasone (FLONASE) 50 MCG/ACT nasal spray Place 1 spray into both nostrils at bedtime.   levothyroxine (SYNTHROID, LEVOTHROID) 88 MCG tablet Take  88 mcg by mouth daily before breakfast.   losartan (COZAAR) 100 MG tablet Take 1 tablet (100 mg total) by mouth daily. Please keep scheduled appointment for future refills. Thank you.   Multiple Vitamin (MULTIVITAMIN WITH MINERALS) TABS tablet Take 1 tablet by mouth daily.   polyethylene glycol powder (MIRALAX) 17 GM/SCOOP powder Take by mouth as needed.   prazosin (MINIPRESS) 1 MG capsule TAKE 1 CAPSULE AT BEDTIME    rosuvastatin (CRESTOR) 5 MG tablet Take 1 tablet (5 mg total) by mouth daily.   sodium chloride (OCEAN) 0.65 % SOLN nasal spray Place 1 spray into both nostrils as needed for congestion.   [DISCONTINUED] Azelastine HCl (ASTEPRO) 0.15 % SOLN Place 1 spray into the nose every morning.     Allergies:   Wound dressing adhesive   Social History   Socioeconomic History   Marital status: Married    Spouse name: Not on file   Number of children: Not on file   Years of education: Not on file   Highest education level: Not on file  Occupational History   Not on file  Tobacco Use   Smoking status: Never   Smokeless tobacco: Never  Vaping Use   Vaping Use: Never used  Substance and Sexual Activity   Alcohol use: Yes    Alcohol/week: 1.0 standard drink of alcohol    Types: 1 Glasses of wine per week   Drug use: No   Sexual activity: Not Currently    Birth control/protection: Post-menopausal  Other Topics Concern   Not on file  Social History Narrative   Not on file   Social Determinants of Health   Financial Resource Strain: Not on file  Food Insecurity: Not on file  Transportation Needs: Not on file  Physical Activity: Not on file  Stress: Not on file  Social Connections: Not on file     Family History: The patient's family history includes Breast cancer (age of onset: 26) in her sister.  ROS:   Please see the history of present illness.     All other systems reviewed and are negative.  EKGs/Labs/Other Studies Reviewed:    The following studies were reviewed today:  Monitor 05/01/2020: No significant abnormalities   Patch Wear Time:  2 days and 23 hours (2022-04-01T16:38:40-0400 to 2022-04-04T16:10:58-0400)   Patient had a min HR of 35 bpm, max HR of 119 bpm, and avg HR of 61 bpm. Predominant underlying rhythm was Sinus Rhythm. 2 Supraventricular Tachycardia runs occurred, the run with the fastest interval lasting 4 beats with a max rate of 119 bpm, the longest  lasting 4 beats with an avg rate of 98 bpm. Isolated SVEs were rare (<1.0%), SVE Couplets were rare (<1.0%), and SVE Triplets were rare (<1.0%). Isolated VEs were rare (<1.0%), and no VE Couplets or VE Triplets were present.  No patient triggered events  Monitor 07/14/2019: Episode of atrial fibrillation lasting 3 days 6 hours, average rate 118 bpm. AF burden 47%. 141 episodes of SVT, longest lasting 18.5 seconds.   7 days of data recorded on Zio monitor. Patient had a min HR of 55 bpm, max HR of 207 bpm, and avg HR of 95 bpm. Predominant underlying rhythm was Sinus Rhythm. No VT, high degree block, or pauses noted.  141 episodes of SVT, longest lasting 18.5 seconds.  Episode of atrial fibrillation lasting 3 days 6 hours, average rate 118 bpm.  AF burden 47%.   Isolated atrial and ventricular ectopy was rare (<1%). There were 0  triggered events.  CMR 07/05/2019: 1. LV hypertrophy at apex measuring up to 49m, consistent with apical hypertrophic cardiomyopathy   2.  Small apical aneurysm   3. Patchy LGE at apex, consistent with apical HCM. LGE accounts for 1% of total myocardial mass   4.  Normal LV size with hyperdynamic systolic function (EF 747%   5.  Normal RV size and systolic function (EF 565%   6. Mild mitral regurgitation (regurgitant volume 16cc, regurgitant fraction 24%)  Echo 05/27/2019: 1. Apical variant hypertrophic cardiomyopathy with no evidence of apical  thrombus. Left ventricular ejection fraction, by estimation, is 60 to 65%.  The left ventricle has normal function. The left ventricle demonstrates  regional wall motion  abnormalities (see scoring diagram/findings for description). There is  severe left ventricular hypertrophy of the apical segment. Left  ventricular diastolic parameters are consistent with Grade I diastolic  dysfunction (impaired relaxation).   2. Right ventricular systolic function is normal. The right ventricular  size is normal. There is moderately  elevated pulmonary artery systolic  pressure. The estimated right ventricular systolic pressure is 446.5mmHg.   3. Left atrial size was mildly dilated.   4. The mitral valve is normal in structure. Mild mitral valve  regurgitation. No evidence of mitral stenosis.   5. Tricuspid valve regurgitation is mild to moderate.   6. Possible fusion of right and non coronary cusp. The aortic valve is  normal in structure. Aortic valve regurgitation is not visualized. Mild to  moderate aortic valve sclerosis/calcification is present, without any  evidence of aortic stenosis.   7. The inferior vena cava is normal in size with greater than 50%  respiratory variability, suggesting right atrial pressure of 3 mmHg.  Exercise Myoview 07/12/2016: Nuclear stress EF: 64%. Blood pressure demonstrated a normal response to exercise. Horizontal ST segment depression ST segment depression was noted during stress in the III, aVF, V4, V5 and V6 leads, and returning to baseline after 1-5 minutes of recovery. Findings are nonspecific given baseline T wave inversions. The study is normal. This is a low risk study. The left ventricular ejection fraction is normal (55-65%).   EKG:   01/08/2022: Atrial paced, rate 60, poor R wave progression, nonspecific T wave flattening 07/04/2021: Atrial paced, rate 60, Q waves in V1/2, nonspecific T wave flattening 12/27/20: Atrial paced, rate 60, Q waves in V1/2, no ST abnormalities 07/10/2020: EKG is not ordered today. 04/14/2020: sinus rhythm, rate 74, LVH with repolarization abnormalities  Recent Labs: 03/20/2021: ALT 34  Recent Lipid Panel    Component Value Date/Time   CHOL 174 03/20/2021 0809   TRIG 79 03/20/2021 0809   HDL 93 03/20/2021 0809   CHOLHDL 1.9 03/20/2021 0809   LDLCALC 66 03/20/2021 0809    Physical Exam:    VS:  BP 134/62 (BP Location: Left Arm, Patient Position: Sitting, Cuff Size: Normal)   Pulse 60   Ht '5\' 4"'$  (1.626 m)   Wt 135 lb (61.2 kg)   BMI  23.17 kg/m     Wt Readings from Last 3 Encounters:  01/08/22 135 lb (61.2 kg)  10/17/21 138 lb 3.2 oz (62.7 kg)  07/04/21 138 lb 6.4 oz (62.8 kg)     GEN: in no acute distress HEENT: Normal NECK: No JVD; No carotid bruits CARDIAC: RRR, 2/6 systolic murmur RESPIRATORY:  Clear to auscultation without rales, wheezing or rhonchi  ABDOMEN: Soft, non-tender, non-distended MUSCULOSKELETAL:  No edema; No deformity  SKIN: Warm and dry NEUROLOGIC:  Alert and  oriented x 3 PSYCHIATRIC:  Normal affect   ASSESSMENT:    1. Hypertrophic cardiomyopathy (HCC)   2. Paroxysmal atrial fibrillation ([place])   3. SVT (supraventricular tachycardia)   4. Essential hypertension   5. Hyperlipidemia, unspecified hyperlipidemia type      PLAN:    Hypertrophic cardiomyopathy: cardiac MRI on 07/05/2019 showed LV apical hypertrophy measuring up to 15 mm, consistent with apical hypertrophic cardiomyopathy, small apical aneurysm, patchy apical LGE accounting for 1% of total myocardial mass.  No NSVT on Zio patch x 7 days -Recommend first degree relatives be screened, reports her son underwent screening echocardiogram -She is reporting intermittent dyspnea, will update echocardiogram  Paroxysmal atrial fibrillation: Diagnosed 04/2019 after presenting to PCPs office with palpitations. CHA2DS2-VASc score 5.  Zio patch x7 days on 07/14/2019 showed AF burden 47%, with episode lasting 3 days and average rate 118 bpm.  She was admitted in June 2022 with near syncope, underwent PPM for tachybrady syndrome. -Continue Eliquis 5 mg twice daily -Continue amiodarone 200 mg daily.  Had mild transaminitis on labs in 12/2020, normalized 02/2021.  Normal TSH 12/2020.  Will check CMET, TSH -Continue diltiazem 300 mg daily  SVT: Frequent episodes on ZIO monitoring, longest lasting 19 seconds.  Continue diltiazem  Hypertension: On losartan 100 mg daily, prazosin 1 mg nightly, diltiazem 300 mg daily.  She has been taking 2 of her  antihypertensives at night, given elevated BP in the afternoon recommend taking in the morning  Hyperlipidemia: On rosuvastatin 5 mg daily, LDL 66 on 03/20/2021   RTC in 6 months   Medication Adjustments/Labs and Tests Ordered: Current medicines are reviewed at length with the patient today.  Concerns regarding medicines are outlined above.  Orders Placed This Encounter  Procedures   Comprehensive metabolic panel   TSH   EKG 12-Lead   ECHOCARDIOGRAM COMPLETE    No orders of the defined types were placed in this encounter.     Patient Instructions  Medication Instructions: Your physician recommends that you continue on your current medications as directed. Please refer to the Current Medication list given to you today.  Try taking diltiazem in the morning and losartan around 10-11  *If you need a refill on your cardiac medications before your next appointment, please call your pharmacy*   Lab Work: CMET, TSH today  If you have labs (blood work) drawn today and your tests are completely normal, you will receive your results only by: [place] (if you have MyChart) OR A paper copy in the mail If you have any lab test that is abnormal or we need to change your treatment, we will call you to review the results.   Testing/Procedures: Your physician has requested that you have an echocardiogram. Echocardiography is a painless test that uses sound waves to create images of your heart. It provides your doctor with information about the size and shape of your heart and how well your heart's chambers and valves are working. This procedure takes approximately one hour. There are no restrictions for this procedure. Please do NOT wear cologne, perfume, aftershave, or lotions (deodorant is allowed). Please arrive 15 minutes prior to your appointment time.   Follow-Up: At [facility], you and your health needs are our priority.  As part of our continuing mission to  provide you with exceptional heart care, we have created designated Provider Care Teams.  These Care Teams include your primary Cardiologist (physician) and Advanced Practice Providers (APPs -  Physician Assistants and Nurse Practitioners)  who all work together to provide you with the care you need, when you need it.  We recommend signing up for the patient portal called "MyChart".  Sign up information is provided on this After Visit Summary.  MyChart is used to connect with patients for Virtual Visits (Telemedicine).  Patients are able to view lab/test results, encounter notes, upcoming appointments, etc.  Non-urgent messages can be sent to your provider as well.   To learn more about what you can do with MyChart, go to [email or web address].    Your next appointment:   6 month(s)  The format for your next appointment:   In Person  Provider:   Donato Heinz, MD       Please check your blood pressure at home daily, write it down.  Call the office or send message via Mychart with the readings in 2 weeks for Dr. Gardiner Rhyme to review.           Signed, Donato Heinz, MD  01/08/2022 10:43 PM    [place]

## 2022-01-08 NOTE — Patient Instructions (Signed)
Medication Instructions: Your physician recommends that you continue on your current medications as directed. Please refer to the Current Medication list given to you today.  Try taking diltiazem in the morning and losartan around 10-11  *If you need a refill on your cardiac medications before your next appointment, please call your pharmacy*   Lab Work: CMET, TSH today  If you have labs (blood work) drawn today and your tests are completely normal, you will receive your results only by: [place] (if you have MyChart) OR A paper copy in the mail If you have any lab test that is abnormal or we need to change your treatment, we will call you to review the results.   Testing/Procedures: Your physician has requested that you have an echocardiogram. Echocardiography is a painless test that uses sound waves to create images of your heart. It provides your doctor with information about the size and shape of your heart and how well your heart's chambers and valves are working. This procedure takes approximately one hour. There are no restrictions for this procedure. Please do NOT wear cologne, perfume, aftershave, or lotions (deodorant is allowed). Please arrive 15 minutes prior to your appointment time.   Follow-Up: At [facility], you and your health needs are our priority.  As part of our continuing mission to provide you with exceptional heart care, we have created designated Provider Care Teams.  These Care Teams include your primary Cardiologist (physician) and Advanced Practice Providers (APPs -  Physician Assistants and Nurse Practitioners) who all work together to provide you with the care you need, when you need it.  We recommend signing up for the patient portal called "MyChart".  Sign up information is provided on this After Visit Summary.  MyChart is used to connect with patients for Virtual Visits (Telemedicine).  Patients are able to view lab/test results, encounter  notes, upcoming appointments, etc.  Non-urgent messages can be sent to your provider as well.   To learn more about what you can do with MyChart, go to [email or web address].    Your next appointment:   6 month(s)  The format for your next appointment:   In Person  Provider:   Donato Heinz, MD       Please check your blood pressure at home daily, write it down.  Call the office or send message via Mychart with the readings in 2 weeks for Dr. Gardiner Rhyme to review.

## 2022-01-09 LAB — COMPREHENSIVE METABOLIC PANEL
ALT: 45 IU/L — ABNORMAL HIGH (ref 0–32)
AST: 50 IU/L — ABNORMAL HIGH (ref 0–40)
Albumin/Globulin Ratio: 1.6 (ref 1.2–2.2)
Albumin: 4.5 g/dL (ref 3.7–4.7)
Alkaline Phosphatase: 102 IU/L (ref 44–121)
BUN/Creatinine Ratio: 14 (ref 12–28)
BUN: 15 mg/dL (ref 8–27)
Bilirubin Total: 0.5 mg/dL (ref 0.0–1.2)
CO2: 22 mmol/L (ref 20–29)
Calcium: 9.3 mg/dL (ref 8.7–10.3)
Chloride: 99 mmol/L (ref 96–106)
Creatinine, Ser: 1.11 mg/dL — ABNORMAL HIGH (ref 0.57–1.00)
Globulin, Total: 2.8 g/dL (ref 1.5–4.5)
Glucose: 94 mg/dL (ref 70–99)
Potassium: 4.7 mmol/L (ref 3.5–5.2)
Sodium: 135 mmol/L (ref 134–144)
Total Protein: 7.3 g/dL (ref 6.0–8.5)
eGFR: 49 mL/min/{1.73_m2} — ABNORMAL LOW (ref >59)

## 2022-01-09 LAB — TSH: TSH: 0.797 u[IU]/mL (ref 0.450–4.500)

## 2022-01-16 ENCOUNTER — Ambulatory Visit
Admission: RE | Admit: 2022-01-16 | Discharge: 2022-01-16 | Disposition: A | Discharge status: Home / Self Care | Payer: Medicare HMO | Source: Ambulatory Visit | Attending: Physician Assistant | Admitting: Physician Assistant

## 2022-01-16 ENCOUNTER — Other Ambulatory Visit: Payer: Self-pay | Admitting: Physician Assistant

## 2022-01-16 DIAGNOSIS — R0989 Other specified symptoms and signs involving the circulatory and respiratory systems: Secondary | ICD-10-CM

## 2022-01-16 DIAGNOSIS — R059 Cough, unspecified: Secondary | ICD-10-CM | POA: Diagnosis not present

## 2022-01-16 DIAGNOSIS — R051 Acute cough: Secondary | ICD-10-CM | POA: Diagnosis not present

## 2022-01-17 ENCOUNTER — Ambulatory Visit (INDEPENDENT_AMBULATORY_CARE_PROVIDER_SITE_OTHER): Payer: Medicare HMO

## 2022-01-17 DIAGNOSIS — I422 Other hypertrophic cardiomyopathy: Secondary | ICD-10-CM | POA: Diagnosis not present

## 2022-01-17 LAB — CUP PACEART REMOTE DEVICE CHECK
Battery Remaining Longevity: 101 mo
Battery Remaining Percentage: 89 %
Battery Voltage: 3.02 V
Brady Statistic AP VP Percent: 1 %
Brady Statistic AP VS Percent: 65 %
Brady Statistic AS VP Percent: 1 %
Brady Statistic AS VS Percent: 34 %
Brady Statistic RA Percent Paced: 65 %
Brady Statistic RV Percent Paced: 1 %
Date Time Interrogation Session: 20231228020016
Implantable Lead Connection Status: 753985
Implantable Lead Connection Status: 753985
Implantable Lead Implant Date: 20220627
Implantable Lead Implant Date: 20220627
Implantable Lead Location: 753859
Implantable Lead Location: 753860
Implantable Pulse Generator Implant Date: 20220627
Lead Channel Impedance Value: 390 Ohm
Lead Channel Impedance Value: 550 Ohm
Lead Channel Pacing Threshold Amplitude: 0.75 V
Lead Channel Pacing Threshold Amplitude: 1.625 V
Lead Channel Pacing Threshold Pulse Width: 0.5 ms
Lead Channel Pacing Threshold Pulse Width: 0.5 ms
Lead Channel Sensing Intrinsic Amplitude: 11.5 mV
Lead Channel Sensing Intrinsic Amplitude: 3.2 mV
Lead Channel Setting Pacing Amplitude: 2.5 V
Lead Channel Setting Pacing Amplitude: 3.125
Lead Channel Setting Pacing Pulse Width: 0.5 ms
Lead Channel Setting Sensing Sensitivity: 2 mV
Pulse Gen Model: 2272
Pulse Gen Serial Number: 3933448

## 2022-01-23 ENCOUNTER — Encounter: Payer: Self-pay | Admitting: Cardiology

## 2022-01-23 ENCOUNTER — Ambulatory Visit
Admission: RE | Admit: 2022-01-23 | Discharge: 2022-01-23 | Disposition: A | Discharge status: Home / Self Care | Payer: Medicare HMO | Source: Ambulatory Visit | Attending: Internal Medicine | Admitting: Internal Medicine

## 2022-01-23 DIAGNOSIS — Z1231 Encounter for screening mammogram for malignant neoplasm of breast: Secondary | ICD-10-CM

## 2022-01-23 DIAGNOSIS — I10 Essential (primary) hypertension: Secondary | ICD-10-CM

## 2022-01-24 ENCOUNTER — Encounter: Payer: Self-pay | Admitting: Cardiology

## 2022-01-24 NOTE — Telephone Encounter (Signed)
Can we get her scheduled in pharmacy hypertension clinic?  She has previously seen Erasmo Downer

## 2022-02-05 NOTE — Progress Notes (Signed)
Remote pacemaker transmission.   

## 2022-02-08 ENCOUNTER — Ambulatory Visit: Payer: Medicare HMO | Attending: Internal Medicine | Admitting: Internal Medicine

## 2022-02-08 ENCOUNTER — Encounter: Payer: Self-pay | Admitting: Internal Medicine

## 2022-02-08 VITALS — BP 134/52 | HR 60 | Ht 64.0 in | Wt 136.2 lb

## 2022-02-08 DIAGNOSIS — I4819 Other persistent atrial fibrillation: Secondary | ICD-10-CM | POA: Diagnosis not present

## 2022-02-08 DIAGNOSIS — Z950 Presence of cardiac pacemaker: Secondary | ICD-10-CM

## 2022-02-08 NOTE — Progress Notes (Signed)
HPI Paula [name] is a 87 y.o. female with apical hypertrophic cardiomyopathy, paroxysmal atrial fibrillation, hypertension, hypothyroidism who was found to have near syncope due to long sinus pauses and underwent DDD PM insertion. She feels much better. No syncope, chest pain or sob  Allergies  Allergen Reactions   Wound Dressing Adhesive     Other reaction(s): rash     Current Outpatient Medications  Medication Sig Dispense Refill   acetaminophen (TYLENOL) 325 MG tablet Take 2 tablets (650 mg total) by mouth every 4 (four) hours as needed for headache or mild pain.     amiodarone (PACERONE) 200 MG tablet TAKE 1 TABLET EVERY DAY EXCEPT SUNDAY//MUST KEEP UPCOMING APPT FOR FURTHER REFILLS 30 tablet 1   calcium carbonate (OS-CAL - DOSED IN MG OF ELEMENTAL CALCIUM) 1250 (500 Ca) MG tablet Take 1 tablet by mouth daily with breakfast.     diltiazem (CARDIZEM CD) 300 MG 24 hr capsule TAKE 1 CAPSULE EVERY DAY 90 capsule 3   ELIQUIS 5 MG TABS tablet Take 1 tablet (5 mg total) by mouth in the morning and at bedtime. 60 tablet    famotidine (PEPCID) 10 MG tablet Take 10 mg by mouth at bedtime.     fluticasone (FLONASE) 50 MCG/ACT nasal spray Place 1 spray into both nostrils at bedtime. 16 g 2   levothyroxine (SYNTHROID, LEVOTHROID) 88 MCG tablet Take 88 mcg by mouth daily before breakfast.     losartan (COZAAR) 100 MG tablet Take 1 tablet (100 mg total) by mouth daily. Please keep scheduled appointment for future refills. Thank you. 90 tablet 0   Multiple Vitamin (MULTIVITAMIN WITH MINERALS) TABS tablet Take 1 tablet by mouth daily.     polyethylene glycol powder (MIRALAX) 17 GM/SCOOP powder Take by mouth as needed.     prazosin (MINIPRESS) 1 MG capsule TAKE 1 CAPSULE AT BEDTIME 90 capsule 3   sodium chloride (OCEAN) 0.65 % SOLN nasal spray Place 1 spray into both nostrils as needed for congestion.     rosuvastatin (CRESTOR) 5 MG tablet Take 1 tablet (5 mg total) by mouth daily. 90  tablet 0   No current facility-administered medications for this visit.     Past Medical History:  Diagnosis Date   CHF (congestive heart failure) (HCC)    Hypothyroidism    Irregular heart rate    Thyroid disease     ROS:   All systems reviewed and negative except as noted in the HPI.   Past Surgical History:  Procedure Laterality Date   PACEMAKER IMPLANT N/A 07/17/2020   Procedure: PACEMAKER IMPLANT;  Surgeon: Evans Lance, MD;  Location: [facility];  Service: Cardiovascular;  Laterality: N/A;     Family History  Problem Relation Age of Onset   Breast cancer Sister 54     Social History   Socioeconomic History   Marital status: Married    Spouse name: Not on file   Number of children: Not on file   Years of education: Not on file   Highest education level: Not on file  Occupational History   Not on file  Tobacco Use   Smoking status: Never   Smokeless tobacco: Never  Vaping Use   Vaping Use: Never used  Substance and Sexual Activity   Alcohol use: Yes    Alcohol/week: 1.0 standard drink of alcohol    Types: 1 Glasses of wine per week   Drug use: No   Sexual activity: Not  Currently    Birth control/protection: Post-menopausal  Other Topics Concern   Not on file  Social History Narrative   Not on file   Social Determinants of Health   Financial Resource Strain: Not on file  Food Insecurity: Not on file  Transportation Needs: Not on file  Physical Activity: Not on file  Stress: Not on file  Social Connections: Not on file  Intimate Partner Violence: Not on file     BP (!) 134/52   Pulse 60   Ht '5\' 4"'$  (1.626 m)   Wt 136 lb 3.2 oz (61.8 kg)   SpO2 96%   BMI 23.38 kg/m   Physical Exam:  Well appearing NAD HEENT: Unremarkable Neck:  No JVD, no thyromegally Lymphatics:  No adenopathy Back:  No CVA tenderness Lungs:  Clear HEART:  Regular rate rhythm, no murmurs, no rubs, no clicks Abd:  soft, positive bowel sounds, no  organomegally, no rebound, no guarding Ext:  2 plus pulses, no edema, no cyanosis, no clubbing Skin:  No rashes no nodules Neuro:  CN II through XII intact, motor grossly intact  EKG  DEVICE  Normal device function.  See PaceArt for details.   Assess/Plan:  Near syncope - this has resolved since her DDD PM was inserted.    2.  Atrial fibrillation - she will remain on amiodarone but as she has not had any additional atrial fib, I would recommend continuing the dose of 200 mg daily, 6 days a week, none on Sunday.   3.  Apical variant hypertrophic cardiomyopathy - she is stable with essentially no symptoms.    4. Coags - she has not had any bleeding on Eliquis. Continue.    Carleene Overlie Lynnleigh Soden,MD

## 2022-02-08 NOTE — Patient Instructions (Signed)
Medication Instructions:  Your physician recommends that you continue on your current medications as directed. Please refer to the Current Medication list given to you today.  *If you need a refill on your cardiac medications before your next appointment, please call your pharmacy*  Lab Work: None ordered.  If you have labs (blood work) drawn today and your tests are completely normal, you will receive your results only by: [place] (if you have MyChart) OR A paper copy in the mail If you have any lab test that is abnormal or we need to change your treatment, we will call you to review the results.  Testing/Procedures: None ordered.  Follow-Up: At [facility], you and your health needs are our priority.  As part of our continuing mission to provide you with exceptional heart care, we have created designated Provider Care Teams.  These Care Teams include your primary Cardiologist (physician) and Advanced Practice Providers (APPs -  Physician Assistants and Nurse Practitioners) who all work together to provide you with the care you need, when you need it.  We recommend signing up for the patient portal called "MyChart".  Sign up information is provided on this After Visit Summary.  MyChart is used to connect with patients for Virtual Visits (Telemedicine).  Patients are able to view lab/test results, encounter notes, upcoming appointments, etc.  Non-urgent messages can be sent to your provider as well.   To learn more about what you can do with MyChart, go to [email or web address].    Your next appointment:   1 year(s)  The format for your next appointment:   In Person  Provider:   Cristopher Peru, MD{or one of the following Advanced Practice Providers on your designated Care Team:   Tommye Standard, [place] Legrand Como "Jonni Sanger" Chalmers Cater, [place]  Remote monitoring is used to monitor your Pacemaker from home. This monitoring reduces the number of office visits required to check your device to  one time per year. It allows [place] to keep an eye on the functioning of your device to ensure it is working properly. You are scheduled for a device check from home on 04/18/22. You may send your transmission at any time that day. If you have a wireless device, the transmission will be sent automatically. After your physician reviews your transmission, you will receive a postcard with your next transmission date.  Important Information About Sugar

## 2022-02-11 ENCOUNTER — Ambulatory Visit (HOSPITAL_COMMUNITY): Payer: Medicare HMO | Attending: Cardiology

## 2022-02-11 DIAGNOSIS — I422 Other hypertrophic cardiomyopathy: Secondary | ICD-10-CM | POA: Diagnosis not present

## 2022-02-11 LAB — ECHOCARDIOGRAM COMPLETE
Area-P 1/2: 3.54 cm2
MV M vel: 5.55 m/s
MV Peak grad: 123.2 mmHg
S' Lateral: 3.2 cm

## 2022-02-11 MED ORDER — PERFLUTREN LIPID MICROSPHERE
1.0000 mL | INTRAVENOUS | Status: AC | PRN
  Administered 2022-02-11: 1 mL via INTRAVENOUS

## 2022-02-12 ENCOUNTER — Other Ambulatory Visit: Payer: Self-pay | Admitting: *Deleted

## 2022-02-12 DIAGNOSIS — I340 Nonrheumatic mitral (valve) insufficiency: Secondary | ICD-10-CM

## 2022-02-12 DIAGNOSIS — I071 Rheumatic tricuspid insufficiency: Secondary | ICD-10-CM

## 2022-02-15 ENCOUNTER — Other Ambulatory Visit: Payer: Self-pay | Admitting: *Deleted

## 2022-02-15 DIAGNOSIS — I071 Rheumatic tricuspid insufficiency: Secondary | ICD-10-CM

## 2022-02-15 DIAGNOSIS — I340 Nonrheumatic mitral (valve) insufficiency: Secondary | ICD-10-CM

## 2022-02-16 ENCOUNTER — Other Ambulatory Visit: Payer: Self-pay | Admitting: Cardiology

## 2022-02-18 ENCOUNTER — Ambulatory Visit
Payer: Medicare HMO | Attending: Cardiovascular Disease | Admitting: Pharmacist Clinician (PhC)/ Clinical Pharmacy Specialist

## 2022-02-18 VITALS — BP 161/70 | HR 60 | Ht 64.0 in | Wt 132.8 lb

## 2022-02-18 DIAGNOSIS — I10 Essential (primary) hypertension: Secondary | ICD-10-CM | POA: Diagnosis not present

## 2022-02-18 MED ORDER — VALSARTAN 160 MG PO TABS: 160.0000 mg | ORAL_TABLET | Freq: Every day | ORAL | 3 refills | Status: DC

## 2022-02-18 NOTE — Progress Notes (Unsigned)
Office Visit    Patient Name: Paula [name] Date of Encounter: 02/19/2022  Primary Care Provider:  Kathalene Frames, MD Primary Cardiologist:  Donato Heinz, MD  Chief Complaint    Hypertension  Significant Past Medical History   Atrial fibrillation CHADS2-VASc score  - on Eliquis; patient cannot tell when she is in AF  Hypertrophic cardiomyopathy    hypothyroidism Last TSH slightly elevated at 4.624; currently on levothyroxine 88 mcg    Allergies  Allergen Reactions   Wound Dressing Adhesive     Other reaction(s): rash    History of Present Illness    Paula [name] is a 87 y.o. female patient of Dr Gardiner Rhyme, in the office today because of elevated home blood pressure readings.   I saw her just over 2 years ago, at which time she was having problems with nighttime BP elevations.  She was waking up at night, usually within 90 minutes of going to bed, with flushing, pounding in her chest and tremors.  Blood pressure would jump to 622-297 systolic.  After drinking some water and listening to soothing music, she would go back to sleep.  We started her on prazosin 1 mg and within a few weeks the episodes were almost completely gone.  She most recently saw Dr. Gardiner Rhyme in December and was doing well, but having some elevated BP readings in the afternoons (130-160's).  He asked that she follow up with PharmD.  She is in the office today with her husband.  She notes that overall she is doing well, however she tends to be a little slow to get going in the mornings.  Notes that with afternoon BP readings being elevated, she moved the losartan to 11 am, but admits that it hasn't changed her readings, and it's hard to remember to stop and take it.    Current Medications: losartan 100 mg qd, diltiazem 360 mg qd   Social Hx: no tobacco, rare alcohol, drinks 1 cup of half-caf coffee most days   Diet: does eat takeout regularly, restaurant up the street is a good  place to get steamed vegetables   Exercise: activities of daily living   Home BP readings:  AM 5 readings average 126/63    PM 14 readings average 146/72   Intolerances: nkda  Adherence Assessment  Do you ever forget to take your medication? '[]'$ Yes '[x]'$ No  Do you ever skip doses due to side effects? '[]'$ Yes '[x]'$ No  Do you have trouble affording your medicines? '[]'$ Yes '[x]'$ No  Are you ever unable to pick up your medication due to transportation difficulties? '[]'$ Yes '[x]'$ No  Do you ever stop taking your medications because you don't believe they are helping? '[]'$ Yes '[x]'$ No  Do you check your weight daily? '[]'$ Yes '[]'$ No    Accessory Clinical Findings    Lab Results  Component Value Date   CREATININE 1.11 (H) 01/08/2022   BUN 15 01/08/2022   NA 135 01/08/2022   K 4.7 01/08/2022   CL 99 01/08/2022   CO2 22 01/08/2022   Lab Results  Component Value Date   ALT 45 (H) 01/08/2022   AST 50 (H) 01/08/2022   ALKPHOS 102 01/08/2022   BILITOT 0.5 01/08/2022   No results found for: "HGBA1C"  Home Medications    Current Outpatient Medications  Medication Sig Dispense Refill   acetaminophen (TYLENOL) 325 MG tablet Take 2 tablets (650 mg total) by mouth every 4 (four) hours as needed for headache or mild pain.  amiodarone (PACERONE) 200 MG tablet TAKE 1 TABLET EVERY DAY EXCEPT SUNDAY//MUST KEEP UPCOMING APPT FOR FURTHER REFILLS 30 tablet 1   calcium carbonate (OS-CAL - DOSED IN MG OF ELEMENTAL CALCIUM) 1250 (500 Ca) MG tablet Take 1 tablet by mouth daily with breakfast.     diltiazem (CARDIZEM CD) 300 MG 24 hr capsule TAKE 1 CAPSULE EVERY DAY 90 capsule 3   ELIQUIS 5 MG TABS tablet Take 1 tablet (5 mg total) by mouth in the morning and at bedtime. 60 tablet    famotidine (PEPCID) 10 MG tablet Take 10 mg by mouth at bedtime.     fluticasone (FLONASE) 50 MCG/ACT nasal spray Place 1 spray into both nostrils at bedtime. 16 g 2   levothyroxine (SYNTHROID, LEVOTHROID) 88 MCG tablet Take 88 mcg by  mouth daily before breakfast.     Multiple Vitamin (MULTIVITAMIN WITH MINERALS) TABS tablet Take 1 tablet by mouth daily.     polyethylene glycol powder (MIRALAX) 17 GM/SCOOP powder Take by mouth as needed.     prazosin (MINIPRESS) 1 MG capsule TAKE 1 CAPSULE AT BEDTIME 90 capsule 3   rosuvastatin (CRESTOR) 5 MG tablet Take 1 tablet (5 mg total) by mouth daily. 90 tablet 0   valsartan (DIOVAN) 160 MG tablet Take 1 tablet (160 mg total) by mouth daily. 90 tablet 3   sodium chloride (OCEAN) 0.65 % SOLN nasal spray Place 1 spray into both nostrils as needed for congestion.     No current facility-administered medications for this visit.     HYPERTENSION CONTROL Vitals:   02/18/22 1146 02/18/22 1150  BP: (!) 150/70 (!) 161/70    The patient's blood pressure is elevated above target today.  In order to address the patient's elevated BP: A current anti-hypertensive medication was adjusted today.     Assessment & Plan    Hypertension Assessment: BP is un/controlled in office BP 161/70 mmHg;  above the goal (<130/80). Compliant with losartan, diltiazem and prazosin Denies SOB, palpitation, chest pain, headaches,or swelling Reiterated the importance of regular exercise and low salt diet   Plan:  Stop taking losartan Start taking valsartan 160 mg once daily in the evenings Continue taking diltiazem and prazosin Patient to keep record of BP readings with heart rate and report to [place] at the next visit Patient to follow up with PharmD in 1 month for follow up  Labs ordered today:  none   Tommy Medal PharmD CPP [facility]  [address] [place], [place] [address] [phone]

## 2022-02-18 NOTE — Patient Instructions (Addendum)
Follow up appointment: Thursday February 29 at 11 am  Take your BP meds as follows:  Stop losartan  Start valsartan 160 mg once daily in the evenings (the day after your last dose of losartan)  Check your blood pressure at home daily (if able) and keep record of the readings.  Hypertension "High blood pressure"  Hypertension is often called "The Silent Killer." It rarely causes symptoms until it is extremely  high or has done damage to other organs in the body. For this reason, you should have your  blood pressure checked regularly by your physician. We will check your blood pressure  every time you see a provider at one of our offices.   Your blood pressure reading consists of two numbers. Ideally, blood pressure should be  below 120/80. The first ("top") number is called the systolic pressure. It measures the  pressure in your arteries as your heart beats. The second ("bottom") number is called the diastolic pressure. It measures the pressure in your arteries as the heart relaxes between beats.  The benefits of getting your blood pressure under control are enormous. A 10-point  reduction in systolic blood pressure can reduce your risk of stroke by 27% and heart failure by 28%  Your blood pressure goal is < 130/80  To check your pressure at home you will need to:  1. Sit up in a chair, with feet flat on the floor and back supported. Do not cross your ankles or legs. 2. Rest your left arm so that the cuff is about heart level. If the cuff goes on your upper arm,  then just relax the arm on the table, arm of the chair or your lap. If you have a wrist cuff, we  suggest relaxing your wrist against your chest (think of it as Pledging the Flag with the  wrong arm).  3. Place the cuff snugly around your arm, about 1 inch above the crook of your elbow. The  cords should be inside the groove of your elbow.  4. Sit quietly, with the cuff in place, for about 5 minutes. After that 5 minutes  press the power  button to start a reading. 5. Do not talk or move while the reading is taking place.  6. Record your readings on a sheet of paper. Although most cuffs have a memory, it is often  easier to see a pattern developing when the numbers are all in front of you.  7. You can repeat the reading after 1-3 minutes if it is recommended  Make sure your bladder is empty and you have not had caffeine or tobacco within the last 30 min  Always bring your blood pressure log with you to your appointments. If you have not brought your monitor in to be double checked for accuracy, please bring it to your next appointment.  You can find a list of quality blood pressure cuffs at validatebp.org

## 2022-02-19 ENCOUNTER — Encounter: Payer: Self-pay | Admitting: Pharmacist Clinician (PhC)/ Clinical Pharmacy Specialist

## 2022-02-19 ENCOUNTER — Emergency Department (HOSPITAL_COMMUNITY): Payer: Medicare HMO

## 2022-02-19 ENCOUNTER — Encounter (HOSPITAL_COMMUNITY): Payer: Self-pay

## 2022-02-19 ENCOUNTER — Other Ambulatory Visit: Payer: Self-pay

## 2022-02-19 ENCOUNTER — Emergency Department (HOSPITAL_COMMUNITY)
Admission: EM | Admit: 2022-02-19 | Discharge: 2022-02-19 | Disposition: A | Discharge status: Home / Self Care | Payer: Medicare HMO | Attending: Emergency Medicine | Admitting: Emergency Medicine

## 2022-02-19 DIAGNOSIS — K8020 Calculus of gallbladder without cholecystitis without obstruction: Secondary | ICD-10-CM | POA: Diagnosis not present

## 2022-02-19 DIAGNOSIS — K5900 Constipation, unspecified: Secondary | ICD-10-CM | POA: Diagnosis not present

## 2022-02-19 DIAGNOSIS — Z79899 Other long term (current) drug therapy: Secondary | ICD-10-CM | POA: Diagnosis not present

## 2022-02-19 DIAGNOSIS — I10 Essential (primary) hypertension: Secondary | ICD-10-CM | POA: Insufficient documentation

## 2022-02-19 DIAGNOSIS — Z7901 Long term (current) use of anticoagulants: Secondary | ICD-10-CM | POA: Diagnosis not present

## 2022-02-19 DIAGNOSIS — R140 Abdominal distension (gaseous): Secondary | ICD-10-CM | POA: Diagnosis not present

## 2022-02-19 LAB — CBC WITH DIFFERENTIAL/PLATELET
Abs Immature Granulocytes: 0.02 10*3/uL (ref 0.00–0.07)
Basophils Absolute: 0.1 10*3/uL (ref 0.0–0.1)
Basophils Relative: 1 %
Eosinophils Absolute: 0.1 10*3/uL (ref 0.0–0.5)
Eosinophils Relative: 1 %
HCT: 33.1 % — ABNORMAL LOW (ref 36.0–46.0)
Hemoglobin: 10.5 g/dL — ABNORMAL LOW (ref 12.0–15.0)
Immature Granulocytes: 0 %
Lymphocytes Relative: 19 %
Lymphs Abs: 1.2 10*3/uL (ref 0.7–4.0)
MCH: 25.9 pg — ABNORMAL LOW (ref 26.0–34.0)
MCHC: 31.7 g/dL (ref 30.0–36.0)
MCV: 81.5 fL (ref 80.0–100.0)
Monocytes Absolute: 0.7 10*3/uL (ref 0.1–1.0)
Monocytes Relative: 12 %
Neutro Abs: 4.3 10*3/uL (ref 1.7–7.7)
Neutrophils Relative %: 67 %
Platelets: 330 10*3/uL (ref 150–400)
RBC: 4.06 MIL/uL (ref 3.87–5.11)
RDW: 15.4 % (ref 11.5–15.5)
WBC: 6.4 10*3/uL (ref 4.0–10.5)
nRBC: 0 % (ref 0.0–0.2)

## 2022-02-19 LAB — URINALYSIS, ROUTINE W REFLEX MICROSCOPIC
Bilirubin Urine: NEGATIVE
Glucose, UA: NEGATIVE mg/dL
Hgb urine dipstick: NEGATIVE
Ketones, ur: NEGATIVE mg/dL
Nitrite: NEGATIVE
Protein, ur: NEGATIVE mg/dL
Specific Gravity, Urine: 1.008 (ref 1.005–1.030)
pH: 7 (ref 5.0–8.0)

## 2022-02-19 LAB — COMPREHENSIVE METABOLIC PANEL
ALT: 38 U/L (ref 0–44)
AST: 42 U/L — ABNORMAL HIGH (ref 15–41)
Albumin: 3.7 g/dL (ref 3.5–5.0)
Alkaline Phosphatase: 71 U/L (ref 38–126)
Anion gap: 11 (ref 5–15)
BUN: 14 mg/dL (ref 8–23)
CO2: 23 mmol/L (ref 22–32)
Calcium: 9.2 mg/dL (ref 8.9–10.3)
Chloride: 97 mmol/L — ABNORMAL LOW (ref 98–111)
Creatinine, Ser: 1.29 mg/dL — ABNORMAL HIGH (ref 0.44–1.00)
GFR, Estimated: 41 mL/min — ABNORMAL LOW (ref >60)
Glucose, Bld: 92 mg/dL (ref 70–99)
Potassium: 4.5 mmol/L (ref 3.5–5.1)
Sodium: 131 mmol/L — ABNORMAL LOW (ref 135–145)
Total Bilirubin: 0.5 mg/dL (ref 0.3–1.2)
Total Protein: 7.3 g/dL (ref 6.5–8.1)

## 2022-02-19 LAB — LIPASE, BLOOD: Lipase: 42 U/L (ref 11–51)

## 2022-02-19 MED ORDER — MORPHINE SULFATE (PF) 4 MG/ML IV SOLN: 4.0000 mg | Freq: Once | INTRAVENOUS | Status: DC

## 2022-02-19 MED ORDER — GOLYTELY 236 G PO SOLR: 4.0000 L | Freq: Once | ORAL | 0 refills | Status: AC

## 2022-02-19 MED ORDER — SODIUM CHLORIDE 0.9 % IV BOLUS
1000.0000 mL | Freq: Once | INTRAVENOUS | Status: AC
  Administered 2022-02-19: 1000 mL via INTRAVENOUS

## 2022-02-19 MED ORDER — IOHEXOL 350 MG/ML SOLN
75.0000 mL | Freq: Once | INTRAVENOUS | Status: AC | PRN
  Administered 2022-02-19: 75 mL via INTRAVENOUS

## 2022-02-19 NOTE — Assessment & Plan Note (Signed)
Assessment: BP is un/controlled in office BP 161/70 mmHg;  above the goal (<130/80). Compliant with losartan, diltiazem and prazosin Denies SOB, palpitation, chest pain, headaches,or swelling Reiterated the importance of regular exercise and low salt diet   Plan:  Stop taking losartan Start taking valsartan 160 mg once daily in the evenings Continue taking diltiazem and prazosin Patient to keep record of BP readings with heart rate and report to [place] at the next visit Patient to follow up with PharmD in 1 month for follow up  Labs ordered today:  none

## 2022-02-19 NOTE — ED Provider Triage Note (Signed)
Emergency Medicine Provider Triage Evaluation Note  Paula [name] , a 87 y.o. female  was evaluated in triage.  Pt complains of constipation for over a week with concern for bowel obstruction.  Patient states that she has been using multiple over-the-counter remedies but has not not had any relief of her constipation.  She has minimal tightness to diffuse abdomen but no severe pain.  States that she has had a couple of very small stools she has passed a week ago but nothing since that time.  Last colonoscopy was about 5 years ago and had no significant abnormalities.  Patient states that she has a chronic history of constipation but usually is able to manage it with home remedies.  No fever, chills, nausea, vomiting, chest pain, shortness of breath, rectal bleeding, or other complaints.  Review of Systems  Positive: See HPI Negative: See HPI  Physical Exam  BP (!) 140/64 (BP Location: Right Arm)   Pulse 60   Temp 98.2 F (36.8 C) (Oral)   Resp 16   SpO2 98%  Gen:   Awake, no distress   Resp:  Normal effort  MSK:   Moves extremities without difficulty  Other:  Abdomen soft and nontender, no rebound or guarding, no distension  Medical Decision Making  Medically screening exam initiated at 1:25 PM.  Appropriate orders placed.  Paula [name] was informed that the remainder of the evaluation will be completed by another provider, this initial triage assessment does not replace that evaluation, and the importance of remaining in the ED until their evaluation is complete.      Suzzette Righter, PA-C 02/19/22 1326

## 2022-02-19 NOTE — ED Triage Notes (Signed)
Pt reports hx of constipation and states she has not had a bowel movement in about a week with the exception of some small pellet-like material after using a suppository. Patient has tried suppositories, Miralax, increasing fiber intake. Denies abdominal pain, n/v.

## 2022-02-19 NOTE — ED Provider Notes (Signed)
[facility] Provider Note   CSN: [id number] Arrival date & time: 02/19/22  1242     History  Chief Complaint  Patient presents with   Constipation    Paula [name] is a 87 y.o. female history of chronic constipation, hypertension, here presenting with constipation.  Patient has been constipated for about a week or so.  Patient has worsening abdominal distention and nausea.  She denies any vomiting.  Saw cardiology yesterday for constipation and there was no mention the abdominal pain.  Patient had previous endoscopy with [facility].  Patient takes MiraLAX daily and uses suppositories occasionally  The history is provided by the patient.       Home Medications Prior to Admission medications   Medication Sig Start Date End Date Taking? Authorizing Provider  acetaminophen (TYLENOL) 325 MG tablet Take 2 tablets (650 mg total) by mouth every 4 (four) hours as needed for headache or mild pain. 07/18/20   Shirley Friar, PA-C  amiodarone (PACERONE) 200 MG tablet TAKE 1 TABLET EVERY DAY EXCEPT SUNDAY//MUST KEEP UPCOMING APPT FOR FURTHER REFILLS 12/27/21   Evans Lance, MD  calcium carbonate (OS-CAL - DOSED IN MG OF ELEMENTAL CALCIUM) 1250 (500 Ca) MG tablet Take 1 tablet by mouth daily with breakfast.    [provider]  diltiazem (CARDIZEM CD) 300 MG 24 hr capsule TAKE 1 CAPSULE EVERY DAY 07/04/21   Donato Heinz, MD  ELIQUIS 5 MG TABS tablet Take 1 tablet (5 mg total) by mouth in the morning and at bedtime. 07/23/20   Shirley Friar, PA-C  famotidine (PEPCID) 10 MG tablet Take 10 mg by mouth at bedtime.    [provider]  fluticasone (FLONASE) 50 MCG/ACT nasal spray Place 1 spray into both nostrils at bedtime. 10/17/21   Chesley Mires, MD  levothyroxine (SYNTHROID, LEVOTHROID) 88 MCG tablet Take 88 mcg by mouth daily before breakfast.    [provider]  Multiple Vitamin (MULTIVITAMIN  WITH MINERALS) TABS tablet Take 1 tablet by mouth daily.    [provider]  polyethylene glycol powder (MIRALAX) 17 GM/SCOOP powder Take by mouth as needed.    [provider]  prazosin (MINIPRESS) 1 MG capsule TAKE 1 CAPSULE AT BEDTIME 08/15/21   Donato Heinz, MD  rosuvastatin (CRESTOR) 5 MG tablet Take 1 tablet (5 mg total) by mouth daily. 10/29/21   Donato Heinz, MD  sodium chloride (OCEAN) 0.65 % SOLN nasal spray Place 1 spray into both nostrils as needed for congestion.    [provider]  valsartan (DIOVAN) 160 MG tablet Take 1 tablet (160 mg total) by mouth daily. 02/18/22   Donato Heinz, MD      Allergies    Wound dressing adhesive    Review of Systems   Review of Systems  Gastrointestinal:  Positive for constipation.  All other systems reviewed and are negative.   Physical Exam Updated Vital Signs BP (!) 165/71   Pulse 68   Temp 98.6 F (37 C) (Oral)   Resp 16   Ht '5\' 4"'$  (1.626 m)   Wt 59.4 kg   SpO2 100%   BMI 22.49 kg/m  Physical Exam Vitals and nursing note reviewed.  Constitutional:      Appearance: Normal appearance.  HENT:     Head: Normocephalic.     Nose: Nose normal.     Mouth/Throat:     Mouth: Mucous membranes are dry.  Eyes:  Extraocular Movements: Extraocular movements intact.     Pupils: Pupils are equal, round, and reactive to light.  Cardiovascular:     Rate and Rhythm: Normal rate and regular rhythm.     Heart sounds: Normal heart sounds.  Pulmonary:     Breath sounds: Normal breath sounds.  Abdominal:     Comments: Distended and mild left lower quadrant tenderness  Genitourinary:    Comments: Rectal- no stool impaction  Musculoskeletal:        General: Normal range of motion.     Cervical back: Normal range of motion and neck supple.  Skin:    General: Skin is warm.     Capillary Refill: Capillary refill takes less than 2 seconds.  Neurological:     General: No focal  deficit present.     Mental Status: She is alert and oriented to person, place, and time.  Psychiatric:        Mood and Affect: Mood normal.        Behavior: Behavior normal.     ED Results / Procedures / Treatments   Labs (all labs ordered are listed, but only abnormal results are displayed) Labs Reviewed  COMPREHENSIVE METABOLIC PANEL - Abnormal; Notable for the following components:      Result Value   Sodium 131 (*)    Chloride 97 (*)    Creatinine, Ser 1.29 (*)    AST 42 (*)    GFR, Estimated 41 (*)    All other components within normal limits  CBC WITH DIFFERENTIAL/PLATELET - Abnormal; Notable for the following components:   Hemoglobin 10.5 (*)    HCT 33.1 (*)    MCH 25.9 (*)    All other components within normal limits  URINALYSIS, ROUTINE W REFLEX MICROSCOPIC - Abnormal; Notable for the following components:   APPearance HAZY (*)    Leukocytes,Ua TRACE (*)    Bacteria, UA FEW (*)    All other components within normal limits  LIPASE, BLOOD    EKG None  Radiology CT ABDOMEN PELVIS W CONTRAST  Result Date: 02/19/2022 CLINICAL DATA:  Bowel obstruction suspected EXAM: CT ABDOMEN AND PELVIS WITH CONTRAST TECHNIQUE: Multidetector CT imaging of the abdomen and pelvis was performed using the standard protocol following bolus administration of intravenous contrast. RADIATION DOSE REDUCTION: This exam was performed according to the departmental dose-optimization program which includes automated exposure control, adjustment of the mA and/or kV according to patient size and/or use of iterative reconstruction technique. CONTRAST:  33m OMNIPAQUE IOHEXOL 350 MG/ML SOLN COMPARISON:  None Available. FINDINGS: Lower chest: No pleural or pericardial effusion. Pacing leads extend towards the RV apex. Visualized lung bases clear, with some motion degradation. Hepatobiliary: Gallbladder is physiologically distended, containing at least 1 2 cm gallstone. No definite wall thickening or  regional inflammatory change. No focal liver lesion or intrahepatic biliary ductal dilatation. Pancreas: Unremarkable. No pancreatic ductal dilatation or surrounding inflammatory changes. Spleen: Normal in size without focal abnormality. Adrenals/Urinary Tract: Adrenal glands are unremarkable. Kidneys are normal, without renal calculi, focal lesion, or hydronephrosis. Bladder is unremarkable. Stomach/Bowel: Small hiatal hernia. The stomach is nondistended, otherwise unremarkable. Small bowel decompressed. Appendix not discretely identified. The colon is partially distended by gas and fecal material, without wall thickening or regional inflammatory change. Vascular/Lymphatic: Moderate calcified aortoiliac atheromatous plaque. No abdominal or pelvic adenopathy. Portal vein patent. Reproductive: Status post hysterectomy. No adnexal masses. Other: Bilateral pelvic phleboliths.  No ascites.  No free air. Musculoskeletal: Mild lumbar levoscoliosis with multilevel spondylitic change.  Sclerotic intramedullary focus in the proximal left femoral shaft suggesting remote bone infarct. No acute findings. IMPRESSION: 1. No acute findings. 2. Cholelithiasis. 3. Small hiatal hernia. 4.  Aortic Atherosclerosis (ICD10-I70.0). Electronically Signed   By: Lucrezia Europe M.D.   On: 02/19/2022 19:43    Procedures Procedures    Medications Ordered in ED Medications  morphine (PF) 4 MG/ML injection 4 mg (4 mg Intravenous Patient Refused/Not Given 02/19/22 1859)  sodium chloride 0.9 % bolus 1,000 mL (0 mLs Intravenous Stopped 02/19/22 2000)  iohexol (OMNIPAQUE) 350 MG/ML injection 75 mL (75 mLs Intravenous Contrast Given 02/19/22 1928)    ED Course/ Medical Decision Making/ A&P                             Medical Decision Making Niema Carrara Bishara is a 87 y.o. female here presenting with abdominal distention and constipation.  Consider ileus versus SBO.  Plan to get CBC and CMP and UA and CT abdomen pelvis.  Will hydrate and  reassess.  Patient has no stool impaction on rectal exam  8:07 PM CT showed constipation and no obvious sterile colitis or obstruction.  Patient has no vomiting.  Patient does have small hard stools.  Will prescribe GoLytely to help with constipation and recommend follow-up with [facility] to discuss bowel regiment   Problems Addressed: Constipation, unspecified constipation type: acute illness or injury  Amount and/or Complexity of Data Reviewed Labs: ordered. Decision-making details documented in ED Course. Radiology: ordered and independent interpretation performed. Decision-making details documented in ED Course.  Risk Prescription drug management.    Final Clinical Impression(s) / ED Diagnoses Final diagnoses:  None    Rx / DC Orders ED Discharge Orders     None         Drenda Freeze, MD 02/19/22 2008

## 2022-02-19 NOTE — Discharge Instructions (Signed)
You have constipation on your CT scan.  I recommend taking GoLytely 4 L and drink plenty of fluids.  This is the bowel prep they usually use before colonoscopy so you will likely have large amount of stool afterwards.  I do recommend taking MiraLAX daily and call [facility] for follow-up to discuss proper bowel regiment  Return to ER if you have constipated for a week, severe abdominal pain or distention or vomiting

## 2022-02-24 ENCOUNTER — Other Ambulatory Visit: Payer: Self-pay | Admitting: Internal Medicine

## 2022-02-26 ENCOUNTER — Other Ambulatory Visit: Payer: Self-pay

## 2022-02-26 MED ORDER — AMIODARONE HCL 200 MG PO TABS: ORAL_TABLET | ORAL | 3 refills | Status: DC

## 2022-02-27 ENCOUNTER — Other Ambulatory Visit: Payer: Self-pay

## 2022-02-27 MED ORDER — AMIODARONE HCL 200 MG PO TABS: ORAL_TABLET | ORAL | 3 refills | Status: DC

## 2022-02-28 DIAGNOSIS — I422 Other hypertrophic cardiomyopathy: Secondary | ICD-10-CM | POA: Diagnosis not present

## 2022-02-28 DIAGNOSIS — I480 Paroxysmal atrial fibrillation: Secondary | ICD-10-CM | POA: Diagnosis not present

## 2022-02-28 DIAGNOSIS — I10 Essential (primary) hypertension: Secondary | ICD-10-CM | POA: Diagnosis not present

## 2022-02-28 DIAGNOSIS — D6869 Other thrombophilia: Secondary | ICD-10-CM | POA: Diagnosis not present

## 2022-02-28 DIAGNOSIS — K5909 Other constipation: Secondary | ICD-10-CM | POA: Diagnosis not present

## 2022-03-06 ENCOUNTER — Other Ambulatory Visit: Payer: Self-pay

## 2022-03-06 MED ORDER — AMIODARONE HCL 200 MG PO TABS: ORAL_TABLET | ORAL | 3 refills | Status: DC

## 2022-03-17 ENCOUNTER — Emergency Department (HOSPITAL_BASED_OUTPATIENT_CLINIC_OR_DEPARTMENT_OTHER): Payer: Medicare HMO

## 2022-03-17 ENCOUNTER — Emergency Department (HOSPITAL_BASED_OUTPATIENT_CLINIC_OR_DEPARTMENT_OTHER): Payer: Medicare HMO | Admitting: Radiology

## 2022-03-17 ENCOUNTER — Emergency Department (HOSPITAL_BASED_OUTPATIENT_CLINIC_OR_DEPARTMENT_OTHER)
Admission: EM | Admit: 2022-03-17 | Discharge: 2022-03-17 | Disposition: A | Discharge status: Home / Self Care | Payer: Medicare HMO | Attending: Emergency Medicine | Admitting: Emergency Medicine

## 2022-03-17 ENCOUNTER — Other Ambulatory Visit: Payer: Self-pay

## 2022-03-17 DIAGNOSIS — I700 Atherosclerosis of aorta: Secondary | ICD-10-CM | POA: Diagnosis not present

## 2022-03-17 DIAGNOSIS — Z7901 Long term (current) use of anticoagulants: Secondary | ICD-10-CM | POA: Diagnosis not present

## 2022-03-17 DIAGNOSIS — K8020 Calculus of gallbladder without cholecystitis without obstruction: Secondary | ICD-10-CM | POA: Diagnosis not present

## 2022-03-17 DIAGNOSIS — M545 Low back pain, unspecified: Secondary | ICD-10-CM | POA: Insufficient documentation

## 2022-03-17 DIAGNOSIS — K5900 Constipation, unspecified: Secondary | ICD-10-CM

## 2022-03-17 DIAGNOSIS — E039 Hypothyroidism, unspecified: Secondary | ICD-10-CM | POA: Insufficient documentation

## 2022-03-17 DIAGNOSIS — I509 Heart failure, unspecified: Secondary | ICD-10-CM | POA: Insufficient documentation

## 2022-03-17 DIAGNOSIS — R109 Unspecified abdominal pain: Secondary | ICD-10-CM | POA: Diagnosis not present

## 2022-03-17 LAB — COMPREHENSIVE METABOLIC PANEL
ALT: 50 U/L — ABNORMAL HIGH (ref 0–44)
AST: 57 U/L — ABNORMAL HIGH (ref 15–41)
Albumin: 4.2 g/dL (ref 3.5–5.0)
Alkaline Phosphatase: 69 U/L (ref 38–126)
Anion gap: 9 (ref 5–15)
BUN: 13 mg/dL (ref 8–23)
CO2: 26 mmol/L (ref 22–32)
Calcium: 9.4 mg/dL (ref 8.9–10.3)
Chloride: 93 mmol/L — ABNORMAL LOW (ref 98–111)
Creatinine, Ser: 0.97 mg/dL (ref 0.44–1.00)
GFR, Estimated: 58 mL/min — ABNORMAL LOW (ref >60)
Glucose, Bld: 97 mg/dL (ref 70–99)
Potassium: 4.5 mmol/L (ref 3.5–5.1)
Sodium: 128 mmol/L — ABNORMAL LOW (ref 135–145)
Total Bilirubin: 0.8 mg/dL (ref 0.3–1.2)
Total Protein: 7.8 g/dL (ref 6.5–8.1)

## 2022-03-17 LAB — CBC WITH DIFFERENTIAL/PLATELET
Abs Immature Granulocytes: 0.04 10*3/uL (ref 0.00–0.07)
Basophils Absolute: 0 10*3/uL (ref 0.0–0.1)
Basophils Relative: 0 %
Eosinophils Absolute: 0.1 10*3/uL (ref 0.0–0.5)
Eosinophils Relative: 1 %
HCT: 32.3 % — ABNORMAL LOW (ref 36.0–46.0)
Hemoglobin: 10.6 g/dL — ABNORMAL LOW (ref 12.0–15.0)
Immature Granulocytes: 1 %
Lymphocytes Relative: 13 %
Lymphs Abs: 1 10*3/uL (ref 0.7–4.0)
MCH: 25.9 pg — ABNORMAL LOW (ref 26.0–34.0)
MCHC: 32.8 g/dL (ref 30.0–36.0)
MCV: 78.8 fL — ABNORMAL LOW (ref 80.0–100.0)
Monocytes Absolute: 0.9 10*3/uL (ref 0.1–1.0)
Monocytes Relative: 11 %
Neutro Abs: 5.8 10*3/uL (ref 1.7–7.7)
Neutrophils Relative %: 74 %
Platelets: 314 10*3/uL (ref 150–400)
RBC: 4.1 MIL/uL (ref 3.87–5.11)
RDW: 15.7 % — ABNORMAL HIGH (ref 11.5–15.5)
WBC: 7.9 10*3/uL (ref 4.0–10.5)
nRBC: 0 % (ref 0.0–0.2)

## 2022-03-17 MED ORDER — IOHEXOL 300 MG/ML  SOLN
100.0000 mL | Freq: Once | INTRAMUSCULAR | Status: AC | PRN
  Administered 2022-03-17: 80 mL via INTRAVENOUS

## 2022-03-17 NOTE — ED Notes (Signed)
Patient currently in restroom, attempting BM after soap suds enema.

## 2022-03-17 NOTE — ED Provider Notes (Signed)
[facility] Provider Note   CSN: [id number] Arrival date & time: 03/17/22  1318     History  Chief Complaint  Patient presents with   GI Problem   Constipation    Paula [name] is a 87 y.o. female.   GI Problem  Constipation Patient presents abdominal pain and constipation.  Seen in the ER for similar symptoms back about a month ago.  Had constipation at that time.  Had been given MiraLAX.  States she was feeling better after delayed not had a lot of stool,.  States it was mostly brown water.  Had been feeling better up until the last week.  States she has not felt as if if she has had a bowel movement in the last week.  Still passing gas.  States abdomen is more painful and more distended.  Mild nausea at times without vomiting.  Also with worsening low back pain.  Reviewed CT scan from previous visit and did have spondylitic change but no severe lumbar bony issues.  Did have gas and fecal material in the colon but no colitis.  No obstruction.    Past Medical History:  Diagnosis Date   CHF (congestive heart failure) (HCC)    Hypothyroidism    Irregular heart rate    Thyroid disease     Home Medications Prior to Admission medications   Medication Sig Start Date End Date Taking? Authorizing Provider  acetaminophen (TYLENOL) 325 MG tablet Take 2 tablets (650 mg total) by mouth every 4 (four) hours as needed for headache or mild pain. 07/18/20   Shirley Friar, PA-C  amiodarone (PACERONE) 200 MG tablet TAKE 1 TABLET EVERY DAY EXCEPT SUNDAY 03/06/22   Donato Heinz, MD  calcium carbonate (OS-CAL - DOSED IN MG OF ELEMENTAL CALCIUM) 1250 (500 Ca) MG tablet Take 1 tablet by mouth daily with breakfast.    [provider]  diltiazem (CARDIZEM CD) 300 MG 24 hr capsule TAKE 1 CAPSULE EVERY DAY 07/04/21   Donato Heinz, MD  ELIQUIS 5 MG TABS tablet Take 1 tablet (5 mg total) by mouth in the morning and  at bedtime. 07/23/20   Shirley Friar, PA-C  famotidine (PEPCID) 10 MG tablet Take 10 mg by mouth at bedtime.    [provider]  fluticasone (FLONASE) 50 MCG/ACT nasal spray Place 1 spray into both nostrils at bedtime. 10/17/21   Chesley Mires, MD  levothyroxine (SYNTHROID, LEVOTHROID) 88 MCG tablet Take 88 mcg by mouth daily before breakfast.    [provider]  Multiple Vitamin (MULTIVITAMIN WITH MINERALS) TABS tablet Take 1 tablet by mouth daily.    [provider]  polyethylene glycol powder (MIRALAX) 17 GM/SCOOP powder Take by mouth as needed.    [provider]  prazosin (MINIPRESS) 1 MG capsule TAKE 1 CAPSULE AT BEDTIME 08/15/21   Donato Heinz, MD  rosuvastatin (CRESTOR) 5 MG tablet Take 1 tablet (5 mg total) by mouth daily. 10/29/21   Donato Heinz, MD  sodium chloride (OCEAN) 0.65 % SOLN nasal spray Place 1 spray into both nostrils as needed for congestion.    [provider]  valsartan (DIOVAN) 160 MG tablet Take 1 tablet (160 mg total) by mouth daily. 02/18/22   Donato Heinz, MD      Allergies    Wound dressing adhesive    Review of Systems   Review of Systems  Gastrointestinal:  Positive for constipation.    Physical  Exam Updated Vital Signs BP (!) 145/68 (BP Location: Left Arm)   Pulse 60   Temp 98.1 F (36.7 C) (Oral)   Resp 20   Ht '5\' 4"'$  (1.626 m)   Wt 59.4 kg   SpO2 98%   BMI 22.48 kg/m  Physical Exam Vitals and nursing note reviewed.  Cardiovascular:     Rate and Rhythm: Regular rhythm.  Abdominal:     Comments: Mild some diffuse tenderness.  Feels somewhat full throughout the abdomen.  No hernia.  No rebound or guarding.  Musculoskeletal:        General: No tenderness.     Cervical back: Neck supple.  Skin:    General: Skin is warm.  Neurological:     Mental Status: She is alert.     ED Results / Procedures / Treatments   Labs (all labs ordered are listed, but only  abnormal results are displayed) Labs Reviewed  COMPREHENSIVE METABOLIC PANEL - Abnormal; Notable for the following components:      Result Value   Sodium 128 (*)    Chloride 93 (*)    AST 57 (*)    ALT 50 (*)    GFR, Estimated 58 (*)    All other components within normal limits  CBC WITH DIFFERENTIAL/PLATELET - Abnormal; Notable for the following components:   Hemoglobin 10.6 (*)    HCT 32.3 (*)    MCV 78.8 (*)    MCH 25.9 (*)    RDW 15.7 (*)    All other components within normal limits    EKG None  Radiology CT ABDOMEN PELVIS W CONTRAST  Result Date: 03/17/2022 CLINICAL DATA:  Abdominal pain, acute, nonlocalized constipation. Constipation EXAM: CT ABDOMEN AND PELVIS WITH CONTRAST TECHNIQUE: Multidetector CT imaging of the abdomen and pelvis was performed using the standard protocol following bolus administration of intravenous contrast. RADIATION DOSE REDUCTION: This exam was performed according to the departmental dose-optimization program which includes automated exposure control, adjustment of the mA and/or kV according to patient size and/or use of iterative reconstruction technique. CONTRAST:  35m OMNIPAQUE IOHEXOL 300 MG/ML  SOLN COMPARISON:  02/19/2022 FINDINGS: Lower chest: Cardiac size within normal limits. Pacer leads are noted within the right ventricle. Left ventricular apical aneurysm again noted. No pericardial effusion. Small hiatal hernia. Visualized lung bases are clear. Hepatobiliary: Cholelithiasis without pericholecystic inflammatory change. Hepatic parenchymal hyperdensity again noted diffusely, possibly related to chronic drug therapy, such as amiodarone, or depositional disease. No focal intrahepatic mass. No intra or extrahepatic biliary ductal dilation. Pancreas: Unremarkable. No pancreatic ductal dilatation or surrounding inflammatory changes. Spleen: Unremarkable Adrenals/Urinary Tract: Adrenal glands are unremarkable. Kidneys are normal, without renal calculi,  focal lesion, or hydronephrosis. Bladder is unremarkable. Stomach/Bowel: There is progressive large volume stool within the ascending and transverse colon. The distal colon is relatively decompressed. No obstructing lesion is identified. The stomach, small bowel, and large bowel are otherwise unremarkable. The appendix is absent. No free intraperitoneal gas or fluid. Vascular/Lymphatic: Aortic atherosclerosis. No enlarged abdominal or pelvic lymph nodes. Reproductive: Status post hysterectomy. No adnexal masses. Other: No abdominal wall hernia. Musculoskeletal: No acute bone abnormality. No lytic or blastic bone lesion. Osseous structures are age-appropriate. IMPRESSION: 1. Progressive large volume stool within the ascending and transverse colon. No obstructing lesion is identified. 2. Cholelithiasis. 3. Left ventricular apical aneurysm again noted. 4. Hepatic parenchymal hyperdensity, possibly related to chronic drug therapy, such as amiodarone, or depositional disease. 5. Aortic atherosclerosis. Aortic Atherosclerosis (ICD10-I70.0). Electronically Signed   By: ACassandria Anger  Christa See M.D.   On: 03/17/2022 20:30   DG Abd 2 Views  Result Date: 03/17/2022 CLINICAL DATA:  Constipation EXAM: ABDOMEN - 2 VIEW COMPARISON:  02/19/2022 FINDINGS: Moderate-large volume stool throughout the colon. There is gaseous distension of the colon. No abnormally dilated loops of small bowel are identified. No gross free intraperitoneal air. Cholelithiasis. No acute bony findings. IMPRESSION: 1. Gaseous distension of the colon, which may represent ileus. 2. Moderate-large volume stool throughout the colon. 3. Cholelithiasis. Electronically Signed   By: Davina Poke D.O.   On: 03/17/2022 16:43    Procedures Procedures    Medications Ordered in ED Medications  iohexol (OMNIPAQUE) 300 MG/ML solution 100 mL (80 mLs Intravenous Contrast Given 03/17/22 1951)    ED Course/ Medical Decision Making/ A&P                              Medical Decision Making Amount and/or Complexity of Data Reviewed Labs: ordered. Radiology: ordered.  Risk Prescription drug management.   Patient with constipation.  Has dealt with some lifelong constipation but not this severe.  States no bowel movement in 5 days.  States that still passing gas.  CT scan reviewed previously and did show some stool in the colon but not particular large amount.  However with fullness will get x-ray to start with.  Will get basic blood work.  Differential diagnose includes constipation, obstruction, nonspecific abdominal pain.  Tumor felt less likely.  Spinal cause felt less likely also. X-ray shows constipation although does have a fair amount of air.  CT scan done and does show right-sided constipation.  Continued pain.  Enema done with some mild relief.  However patient is eager to leave.  I think is reasonable with outpatient follow-up.  Continue her MiraLAX.  Discharge home.       Final Clinical Impression(s) / ED Diagnoses Final diagnoses:  Constipation, unspecified constipation type    Rx / DC Orders ED Discharge Orders     None         Davonna Belling, MD 03/17/22 [id number]

## 2022-03-17 NOTE — Discharge Instructions (Signed)
Follow-up with your gastroenterologist.  Take MiraLAX for the next few days to help clear out the system more.  Your liver does look a little irritated.  You may need to talk with cardiology or GI since amiodarone can sometimes cause problems like this.

## 2022-03-17 NOTE — ED Triage Notes (Signed)
Patient arrives with complaints of worsening constipation. Patient was seen at [facility] for the same one month ago and at her PCP. She has been taking meds at home as well as a prescribed medications for constipation with no relief.

## 2022-03-17 NOTE — ED Notes (Signed)
Patient reports she had a small BM and does feel better.  MD Alvino Chapel made aware.

## 2022-03-19 ENCOUNTER — Emergency Department (HOSPITAL_COMMUNITY): Payer: Medicare HMO

## 2022-03-19 ENCOUNTER — Observation Stay (HOSPITAL_COMMUNITY)
Admission: EM | Admit: 2022-03-19 | Discharge: 2022-03-20 | Disposition: A | Discharge status: Home / Self Care | Payer: Medicare HMO | Attending: Emergency Medicine | Admitting: Emergency Medicine

## 2022-03-19 ENCOUNTER — Other Ambulatory Visit: Payer: Self-pay

## 2022-03-19 DIAGNOSIS — I700 Atherosclerosis of aorta: Secondary | ICD-10-CM | POA: Diagnosis not present

## 2022-03-19 DIAGNOSIS — I110 Hypertensive heart disease with heart failure: Secondary | ICD-10-CM | POA: Insufficient documentation

## 2022-03-19 DIAGNOSIS — I480 Paroxysmal atrial fibrillation: Secondary | ICD-10-CM | POA: Diagnosis present

## 2022-03-19 DIAGNOSIS — E871 Hypo-osmolality and hyponatremia: Secondary | ICD-10-CM | POA: Diagnosis present

## 2022-03-19 DIAGNOSIS — R109 Unspecified abdominal pain: Secondary | ICD-10-CM | POA: Diagnosis not present

## 2022-03-19 DIAGNOSIS — Z79899 Other long term (current) drug therapy: Secondary | ICD-10-CM | POA: Insufficient documentation

## 2022-03-19 DIAGNOSIS — R970 Elevated carcinoembryonic antigen [CEA]: Secondary | ICD-10-CM | POA: Insufficient documentation

## 2022-03-19 DIAGNOSIS — Z950 Presence of cardiac pacemaker: Secondary | ICD-10-CM | POA: Diagnosis present

## 2022-03-19 DIAGNOSIS — I509 Heart failure, unspecified: Secondary | ICD-10-CM | POA: Insufficient documentation

## 2022-03-19 DIAGNOSIS — K5900 Constipation, unspecified: Principal | ICD-10-CM | POA: Diagnosis present

## 2022-03-19 DIAGNOSIS — R112 Nausea with vomiting, unspecified: Secondary | ICD-10-CM

## 2022-03-19 DIAGNOSIS — D6869 Other thrombophilia: Secondary | ICD-10-CM | POA: Diagnosis present

## 2022-03-19 DIAGNOSIS — R1084 Generalized abdominal pain: Secondary | ICD-10-CM

## 2022-03-19 DIAGNOSIS — E039 Hypothyroidism, unspecified: Secondary | ICD-10-CM | POA: Diagnosis not present

## 2022-03-19 DIAGNOSIS — Z7901 Long term (current) use of anticoagulants: Secondary | ICD-10-CM | POA: Insufficient documentation

## 2022-03-19 DIAGNOSIS — I10 Essential (primary) hypertension: Secondary | ICD-10-CM | POA: Diagnosis not present

## 2022-03-19 LAB — CBC
HCT: 33.1 % — ABNORMAL LOW (ref 36.0–46.0)
Hemoglobin: 10.9 g/dL — ABNORMAL LOW (ref 12.0–15.0)
MCH: 25.9 pg — ABNORMAL LOW (ref 26.0–34.0)
MCHC: 32.9 g/dL (ref 30.0–36.0)
MCV: 78.6 fL — ABNORMAL LOW (ref 80.0–100.0)
Platelets: 344 10*3/uL (ref 150–400)
RBC: 4.21 MIL/uL (ref 3.87–5.11)
RDW: 15.8 % — ABNORMAL HIGH (ref 11.5–15.5)
WBC: 7.9 10*3/uL (ref 4.0–10.5)
nRBC: 0 % (ref 0.0–0.2)

## 2022-03-19 LAB — COMPREHENSIVE METABOLIC PANEL
ALT: 51 U/L — ABNORMAL HIGH (ref 0–44)
AST: 58 U/L — ABNORMAL HIGH (ref 15–41)
Albumin: 3.4 g/dL — ABNORMAL LOW (ref 3.5–5.0)
Alkaline Phosphatase: 75 U/L (ref 38–126)
Anion gap: 9 (ref 5–15)
BUN: 16 mg/dL (ref 8–23)
CO2: 24 mmol/L (ref 22–32)
Calcium: 8.9 mg/dL (ref 8.9–10.3)
Chloride: 92 mmol/L — ABNORMAL LOW (ref 98–111)
Creatinine, Ser: 1.02 mg/dL — ABNORMAL HIGH (ref 0.44–1.00)
GFR, Estimated: 54 mL/min — ABNORMAL LOW (ref >60)
Glucose, Bld: 116 mg/dL — ABNORMAL HIGH (ref 70–99)
Potassium: 4.3 mmol/L (ref 3.5–5.1)
Sodium: 125 mmol/L — ABNORMAL LOW (ref 135–145)
Total Bilirubin: 0.7 mg/dL (ref 0.3–1.2)
Total Protein: 7.1 g/dL (ref 6.5–8.1)

## 2022-03-19 LAB — URINALYSIS, MICROSCOPIC (REFLEX)

## 2022-03-19 LAB — URINALYSIS, ROUTINE W REFLEX MICROSCOPIC
Bilirubin Urine: NEGATIVE
Glucose, UA: NEGATIVE mg/dL
Ketones, ur: NEGATIVE mg/dL
Leukocytes,Ua: NEGATIVE
Nitrite: NEGATIVE
Protein, ur: NEGATIVE mg/dL
Specific Gravity, Urine: 1.01 (ref 1.005–1.030)
pH: 6.5 (ref 5.0–8.0)

## 2022-03-19 LAB — OSMOLALITY: Osmolality: 263 mOsm/kg — ABNORMAL LOW (ref 275–295)

## 2022-03-19 LAB — TSH: TSH: 3.074 u[IU]/mL (ref 0.350–4.500)

## 2022-03-19 LAB — LIPASE, BLOOD: Lipase: 29 U/L (ref 11–51)

## 2022-03-19 MED ORDER — APIXABAN 5 MG PO TABS
5.0000 mg | ORAL_TABLET | Freq: Two times a day (BID) | ORAL | Status: DC
  Administered 2022-03-19 – 2022-03-20 (×2): 5 mg via ORAL
  Filled 2022-03-19 (×2): qty 1

## 2022-03-19 MED ORDER — SORBITOL 70 % SOLN
400.0000 mL | TOPICAL_OIL | Freq: Once | ORAL | Status: DC
  Filled 2022-03-19: qty 120

## 2022-03-19 MED ORDER — AMIODARONE HCL 200 MG PO TABS
200.0000 mg | ORAL_TABLET | Freq: Every day | ORAL | Status: DC
  Administered 2022-03-19 – 2022-03-20 (×2): 200 mg via ORAL
  Filled 2022-03-19 (×2): qty 1

## 2022-03-19 MED ORDER — SODIUM CHLORIDE 0.9 % IV BOLUS
1000.0000 mL | Freq: Once | INTRAVENOUS | Status: AC
  Administered 2022-03-19: 1000 mL via INTRAVENOUS

## 2022-03-19 MED ORDER — DILTIAZEM HCL ER COATED BEADS 300 MG PO CP24
300.0000 mg | ORAL_CAPSULE | Freq: Every evening | ORAL | Status: DC
  Administered 2022-03-19: 300 mg via ORAL
  Filled 2022-03-19 (×3): qty 1

## 2022-03-19 MED ORDER — SORBITOL 70 % SOLN
960.0000 mL | TOPICAL_OIL | Freq: Once | ORAL | Status: AC
  Administered 2022-03-19: 960 mL via RECTAL
  Filled 2022-03-19: qty 240

## 2022-03-19 MED ORDER — SODIUM CHLORIDE 0.9 % IV SOLN: INTRAVENOUS | Status: DC

## 2022-03-19 MED ORDER — IOHEXOL 350 MG/ML SOLN
75.0000 mL | Freq: Once | INTRAVENOUS | Status: AC | PRN
  Administered 2022-03-19: 75 mL via INTRAVENOUS

## 2022-03-19 MED ORDER — LEVOTHYROXINE SODIUM 88 MCG PO TABS
88.0000 ug | ORAL_TABLET | Freq: Every day | ORAL | Status: DC
  Administered 2022-03-20: 88 ug via ORAL
  Filled 2022-03-19: qty 1

## 2022-03-19 NOTE — ED Notes (Signed)
ED TO INPATIENT HANDOFF REPORT  ED Nurse Name and Phone #: Jae Dire Name/Age/Gender Paula [name] 87 y.o. female Room/Bed: 009C/009C  Code Status   Code Status: Prior  Home/SNF/Other Home Patient oriented to: self, place, time, and situation Is this baseline? Yes   Triage Complete: Triage complete  Chief Complaint Constipation [K59.00]  Triage Note Pt here for continued constipation. Seen at [facility] Sunday for same and has continued taking OTC and prescription medications without relief and patient is now vomiting when she tries to eat or drink anything. Endorses abdominal pain and bloating. Last BM 9 days ago.    Allergies Allergies  Allergen Reactions   Wound Dressing Adhesive     Other reaction(s): rash    Level of Care/Admitting Diagnosis ED Disposition     ED Disposition  Admit   Condition  --   Comment  Hospital Area: [place] [[id number]]  Level of Care: Med-Surg [16]  May place patient in observation at [facility] or [facility] if equivalent level of care is available:: Yes  Covid Evaluation: Confirmed COVID Negative  Diagnosis: Constipation [id number]  Admitting Physician: Haywood Pao  Attending Physician: Haywood Pao          B Medical/Surgery History Past Medical History:  Diagnosis Date   CHF (congestive heart failure) ([place])    Hypothyroidism    Irregular heart rate    Thyroid disease    Past Surgical History:  Procedure Laterality Date   PACEMAKER IMPLANT N/A 07/17/2020   Procedure: PACEMAKER IMPLANT;  Surgeon: Evans Lance, MD;  Location: [place] [facility];  Service: Cardiovascular;  Laterality: N/A;     A IV Location/Drains/Wounds Patient Lines/Drains/Airways Status     Active Line/Drains/Airways     Name Placement date Placement time Site Days   Peripheral IV 03/19/22 20 G Anterior;Left;Proximal Forearm 03/19/22  1439  Forearm  less than 1            Intake/Output  Last 24 hours No intake or output data in the 24 hours ending 03/19/22 2103  Labs/Imaging Results for orders placed or performed during the hospital encounter of 03/19/22 (from the past 48 hour(s))  Urinalysis, Routine w reflex microscopic -Urine, Clean Catch     Status: Abnormal   Collection Time: 03/19/22  1:02 PM  Result Value Ref Range   Color, Urine YELLOW YELLOW   APPearance CLEAR CLEAR   Specific Gravity, Urine 1.010 1.005 - 1.030   pH 6.5 5.0 - 8.0   Glucose, UA NEGATIVE NEGATIVE mg/dL   Hgb urine dipstick TRACE (A) NEGATIVE   Bilirubin Urine NEGATIVE NEGATIVE   Ketones, ur NEGATIVE NEGATIVE mg/dL   Protein, ur NEGATIVE NEGATIVE mg/dL   Nitrite NEGATIVE NEGATIVE   Leukocytes,Ua NEGATIVE NEGATIVE    Comment: Performed at [facility] [address], [place], [place] [address]  Urinalysis, Microscopic (reflex)     Status: Abnormal   Collection Time: 03/19/22  1:02 PM  Result Value Ref Range   RBC / HPF 0-5 0 - 5 RBC/hpf   WBC, UA 0-5 0 - 5 WBC/hpf   Bacteria, UA RARE (A) NONE SEEN   Squamous Epithelial / HPF 0-5 0 - 5 /HPF   Mucus PRESENT     Comment: Performed at [facility], [place] [address], [place], [place] [address]  Lipase, blood     Status: None   Collection Time: 03/19/22  1:19 PM  Result Value Ref Range  Lipase 29 11 - 51 U/L    Comment: Performed at [facility], [place] [address], [place], [place] [address]  Comprehensive metabolic panel     Status: Abnormal   Collection Time: 03/19/22  1:19 PM  Result Value Ref Range   Sodium 125 (L) 135 - 145 mmol/L   Potassium 4.3 3.5 - 5.1 mmol/L   Chloride 92 (L) 98 - 111 mmol/L   CO2 24 22 - 32 mmol/L   Glucose, Bld 116 (H) 70 - 99 mg/dL    Comment: Glucose reference range applies only to samples taken after fasting for at least 8 hours.   BUN 16 8 - 23 mg/dL   Creatinine, Ser 1.02 (H) 0.44 - 1.00 mg/dL   Calcium 8.9 8.9 - 10.3 mg/dL   Total Protein 7.1 6.5 - 8.1 g/dL   Albumin 3.4 (L) 3.5 - 5.0  g/dL   AST 58 (H) 15 - 41 U/L   ALT 51 (H) 0 - 44 U/L   Alkaline Phosphatase 75 38 - 126 U/L   Total Bilirubin 0.7 0.3 - 1.2 mg/dL   GFR, Estimated 54 (L) >60 mL/min    Comment: (NOTE) Calculated using the CKD-EPI Creatinine Equation (2021)    Anion gap 9 5 - 15    Comment: Performed at [facility], [place] [address] [place], [place] [address]  CBC     Status: Abnormal   Collection Time: 03/19/22  1:19 PM  Result Value Ref Range   WBC 7.9 4.0 - 10.5 K/uL   RBC 4.21 3.87 - 5.11 MIL/uL   Hemoglobin 10.9 (L) 12.0 - 15.0 g/dL   HCT 33.1 (L) 36.0 - 46.0 %   MCV 78.6 (L) 80.0 - 100.0 fL   MCH 25.9 (L) 26.0 - 34.0 pg   MCHC 32.9 30.0 - 36.0 g/dL   RDW 15.8 (H) 11.5 - 15.5 %   Platelets 344 150 - 400 K/uL   nRBC 0.0 0.0 - 0.2 %    Comment: Performed at [facility] [address], [place], [place] [address]   CT ABDOMEN PELVIS W CONTRAST  Result Date: 03/19/2022 CLINICAL DATA:  Abdominal pain.  Constipation. EXAM: CT ABDOMEN AND PELVIS WITH CONTRAST TECHNIQUE: Multidetector CT imaging of the abdomen and pelvis was performed using the standard protocol following bolus administration of intravenous contrast. RADIATION DOSE REDUCTION: This exam was performed according to the departmental dose-optimization program which includes automated exposure control, adjustment of the mA and/or kV according to patient size and/or use of iterative reconstruction technique. CONTRAST:  5m OMNIPAQUE IOHEXOL 350 MG/ML SOLN COMPARISON:  03/17/22 FINDINGS: Lower chest: No acute abnormality. Scarring noted within both lung bases. No pleural fluid or airspace disease. Hepatobiliary: Again noted is diffuse increased density throughout the liver which may be related to drug therapy such as amiodarone versus hemochromatosis. No focal liver lesion identified. Gallbladder is mildly distended. Stone within the gallbladder measures 2.5 cm. No pericholecystic inflammation. Increase caliber of the common bile duct  measures 8 mm. Mild intrahepatic bile duct dilatation is similar to the previous exam. Pancreas: Unremarkable. No pancreatic ductal dilatation or surrounding inflammatory changes. Spleen: Normal in size without focal abnormality. Adrenals/Urinary Tract: Normal adrenal glands. No signs of nephrolithiasis, hydronephrosis or mass. Bladder is unremarkable. Stomach/Bowel: Small hiatal hernia. Stomach appears decompressed. The duodenum and proximal jejunal bowel loops are normal in caliber. Mid small bowel loops are abnormally dilated and contain multiple air-fluid levels which measure up to 3.7 cm. Transition  to decreased caliber distal small bowel is noted within the right lower quadrant at the level of the terminal ileum just proximal to the ileocecal valve. As before there is a large stool burden noted within the colon. No colonic wall thickening or inflammation. Vascular/Lymphatic: Aortic atherosclerosis. No signs of abdominopelvic adenopathy. Reproductive: Status post hysterectomy. No adnexal masses. Other: Small volume of free fluid identified within the dependent portion of the pelvis. No pneumoperitoneum. No focal fluid collections. Musculoskeletal: No acute or suspicious osseous findings. Unchanged appearance of mild superior endplate deformity involving the L1 vertebra. IMPRESSION: 1. Examination is positive for small bowel obstruction with transition to decreased caliber distal small bowel at the level of the terminal ileum just proximal to the ileocecal valve. 2. Large stool burden noted within the colon compatible with constipation. 3. Gallstone with mild gallbladder distension and mild intrahepatic and extrahepatic bile duct dilatation. Correlate for any clinical signs or symptoms of biliary obstruction. 4. Diffuse increased density throughout the liver may be related to drug therapy such as amiodarone versus hemochromatosis. 5. Small volume of free fluid identified within the dependent portion of the  pelvis. 6.  Aortic Atherosclerosis (ICD10-I70.0). Electronically Signed   By: Kerby Moors M.D.   On: 03/19/2022 15:34   DG Abdomen 1 View  Result Date: 03/19/2022 CLINICAL DATA:  Obstruction EXAM: ABDOMEN - 1 VIEW COMPARISON:  03/17/2022 FINDINGS: No significant change in bowel gas pattern with diffusely distended small bowel and colon throughout the abdomen and pelvis, largest loops of small bowel measuring 5.0 cm,: Measuring up to 5.7 cm. Large burden of stool throughout the colon with gas present to the rectum. No free air in the abdomen. IMPRESSION: No significant change in bowel gas pattern with diffusely distended small bowel and colon throughout the abdomen and pelvis. Large burden of stool throughout the colon with gas present to the rectum. Electronically Signed   By: Delanna Ahmadi M.D.   On: 03/19/2022 13:54    Pending Labs Unresulted Labs (From admission, onward)     Start     Ordered   03/19/22 2053  CEA  Once,   R        03/19/22 2052   03/19/22 2035  Osmolality  Once,   R        03/19/22 2034   03/19/22 2035  TSH  Once,   R        03/19/22 2034   03/19/22 2034  Sodium, urine, random  Once,   R        03/19/22 2034   03/19/22 2034  Osmolality, urine  Once,   R        03/19/22 2034            Vitals/Pain Today's Vitals   03/19/22 1827 03/19/22 1827 03/19/22 1828 03/19/22 1900  BP: (!) 163/77 (!) 163/77  139/83  Pulse: 72 73  71  Resp: '14 20  17  '$ Temp:   98 F (36.7 C)   TempSrc:   Oral   SpO2: 100% 98%  98%  PainSc:  0-No pain      Isolation Precautions No active isolations  Medications Medications  sorbitol, milk of mag, mineral oil, glycerin (SMOG) enema (has no administration in time range)  amiodarone (PACERONE) tablet 200 mg (has no administration in time range)  diltiazem (CARDIZEM CD) 24 hr capsule 300 mg (has no administration in time range)  apixaban (ELIQUIS) tablet 5 mg (has no administration in time range)  levothyroxine (SYNTHROID) tablet  88 mcg (has no administration in time range)  sorbitol, milk of mag, mineral oil, glycerin (SMOG) enema (has no administration in time range)  iohexol (OMNIPAQUE) 350 MG/ML injection 75 mL (75 mLs Intravenous Contrast Given 03/19/22 1508)  sodium chloride 0.9 % bolus 1,000 mL (1,000 mLs Intravenous New Bag/Given 03/19/22 1823)    Mobility walks     Focused Assessments     R Recommendations: See Admitting Provider Note  Report given to:   Additional Notes:

## 2022-03-19 NOTE — ED Triage Notes (Signed)
Pt here for continued constipation. Seen at [facility] Sunday for same and has continued taking OTC and prescription medications without relief and patient is now vomiting when she tries to eat or drink anything. Endorses abdominal pain and bloating. Last BM 9 days ago.

## 2022-03-19 NOTE — ED Provider Notes (Signed)
[facility] Provider Note   CSN: [id number] Arrival date & time: 03/19/22  1302     History  Chief Complaint  Patient presents with   Constipation    Paula [name] is a 87 y.o. female with hx of chronic constipation, CHF, hypothyroidism, pacemaker, mitral valve insufficiency, HTN, paroxysmal A-fib presenting to ED with chief complaint of continued constipation.    Seen Sunday at [facility] for same, had an unsuccessful enema at the time.  Continued trying miralax and sennakot at home with no relief.  Was told her stool burden is not near the opening and thus has had difficulty finding relief.  Notes 7-8 pounds gained in last 1-2 weeks.  Does not believe she's able to pass flatulence anymore.  Abdomen feels more distended and painful.  Now also reports NBNB vomiting when attempting to eat or drink anything, unable to keep food and medications down over the last 2-3 days.  Notes decreased urinary output as well.  Last BM 9 days ago. Denies fever or chills.    The history is provided by the patient, medical records and the spouse.  Constipation    Home Medications Prior to Admission medications   Medication Sig Start Date End Date Taking? Authorizing Provider  acetaminophen (TYLENOL) 325 MG tablet Take 2 tablets (650 mg total) by mouth every 4 (four) hours as needed for headache or mild pain. 07/18/20   Shirley Friar, PA-C  amiodarone (PACERONE) 200 MG tablet TAKE 1 TABLET EVERY DAY EXCEPT SUNDAY 03/06/22   Donato Heinz, MD  calcium carbonate (OS-CAL - DOSED IN MG OF ELEMENTAL CALCIUM) 1250 (500 Ca) MG tablet Take 1 tablet by mouth daily with breakfast.    [provider]  diltiazem (CARDIZEM CD) 300 MG 24 hr capsule TAKE 1 CAPSULE EVERY DAY 07/04/21   Donato Heinz, MD  ELIQUIS 5 MG TABS tablet Take 1 tablet (5 mg total) by mouth in the morning and at bedtime. 07/23/20   Shirley Friar, PA-C   famotidine (PEPCID) 10 MG tablet Take 10 mg by mouth at bedtime.    [provider]  fluticasone (FLONASE) 50 MCG/ACT nasal spray Place 1 spray into both nostrils at bedtime. 10/17/21   Chesley Mires, MD  levothyroxine (SYNTHROID, LEVOTHROID) 88 MCG tablet Take 88 mcg by mouth daily before breakfast.    [provider]  Multiple Vitamin (MULTIVITAMIN WITH MINERALS) TABS tablet Take 1 tablet by mouth daily.    [provider]  polyethylene glycol powder (MIRALAX) 17 GM/SCOOP powder Take by mouth as needed.    [provider]  prazosin (MINIPRESS) 1 MG capsule TAKE 1 CAPSULE AT BEDTIME 08/15/21   Donato Heinz, MD  rosuvastatin (CRESTOR) 5 MG tablet Take 1 tablet (5 mg total) by mouth daily. 10/29/21   Donato Heinz, MD  sodium chloride (OCEAN) 0.65 % SOLN nasal spray Place 1 spray into both nostrils as needed for congestion.    [provider]  valsartan (DIOVAN) 160 MG tablet Take 1 tablet (160 mg total) by mouth daily. 02/18/22   Donato Heinz, MD      Allergies    Wound dressing adhesive    Review of Systems   Review of Systems  Gastrointestinal:  Positive for constipation.    Physical Exam Updated Vital Signs BP 139/83   Pulse 71   Temp 98 F (36.7 C) (Oral)   Resp 17   SpO2 98%  Physical Exam  Vitals and nursing note reviewed.  Constitutional:      General: She is not in acute distress.    Appearance: She is well-developed. She is not ill-appearing, toxic-appearing or diaphoretic.  HENT:     Head: Normocephalic and atraumatic.  Eyes:     General: No scleral icterus.    Conjunctiva/sclera: Conjunctivae normal.  Cardiovascular:     Rate and Rhythm: Normal rate and regular rhythm.     Heart sounds: No murmur heard. Pulmonary:     Effort: Pulmonary effort is normal. No respiratory distress.     Breath sounds: Normal breath sounds.  Abdominal:     General: Bowel sounds are decreased. There is distension  (Mild).     Palpations: Abdomen is soft. There is no shifting dullness.     Tenderness: There is generalized abdominal tenderness. There is no right CVA tenderness, left CVA tenderness, guarding or rebound.  Musculoskeletal:        General: No swelling.     Cervical back: Neck supple.  Skin:    General: Skin is warm and dry.     Capillary Refill: Capillary refill takes less than 2 seconds.     Coloration: Skin is not cyanotic, jaundiced or pale.  Neurological:     Mental Status: She is alert.  Psychiatric:        Mood and Affect: Mood normal.     ED Results / Procedures / Treatments   Labs (all labs ordered are listed, but only abnormal results are displayed) Labs Reviewed  COMPREHENSIVE METABOLIC PANEL - Abnormal; Notable for the following components:      Result Value   Sodium 125 (*)    Chloride 92 (*)    Glucose, Bld 116 (*)    Creatinine, Ser 1.02 (*)    Albumin 3.4 (*)    AST 58 (*)    ALT 51 (*)    GFR, Estimated 54 (*)    All other components within normal limits  CBC - Abnormal; Notable for the following components:   Hemoglobin 10.9 (*)    HCT 33.1 (*)    MCV 78.6 (*)    MCH 25.9 (*)    RDW 15.8 (*)    All other components within normal limits  LIPASE, BLOOD  URINALYSIS, ROUTINE W REFLEX MICROSCOPIC    EKG None  Radiology CT ABDOMEN PELVIS W CONTRAST  Result Date: 03/19/2022 CLINICAL DATA:  Abdominal pain.  Constipation. EXAM: CT ABDOMEN AND PELVIS WITH CONTRAST TECHNIQUE: Multidetector CT imaging of the abdomen and pelvis was performed using the standard protocol following bolus administration of intravenous contrast. RADIATION DOSE REDUCTION: This exam was performed according to the departmental dose-optimization program which includes automated exposure control, adjustment of the mA and/or kV according to patient size and/or use of iterative reconstruction technique. CONTRAST:  78m OMNIPAQUE IOHEXOL 350 MG/ML SOLN COMPARISON:  03/17/22 FINDINGS: Lower  chest: No acute abnormality. Scarring noted within both lung bases. No pleural fluid or airspace disease. Hepatobiliary: Again noted is diffuse increased density throughout the liver which may be related to drug therapy such as amiodarone versus hemochromatosis. No focal liver lesion identified. Gallbladder is mildly distended. Stone within the gallbladder measures 2.5 cm. No pericholecystic inflammation. Increase caliber of the common bile duct measures 8 mm. Mild intrahepatic bile duct dilatation is similar to the previous exam. Pancreas: Unremarkable. No pancreatic ductal dilatation or surrounding inflammatory changes. Spleen: Normal in size without focal abnormality. Adrenals/Urinary Tract: Normal adrenal glands. No signs of nephrolithiasis, hydronephrosis or  mass. Bladder is unremarkable. Stomach/Bowel: Small hiatal hernia. Stomach appears decompressed. The duodenum and proximal jejunal bowel loops are normal in caliber. Mid small bowel loops are abnormally dilated and contain multiple air-fluid levels which measure up to 3.7 cm. Transition to decreased caliber distal small bowel is noted within the right lower quadrant at the level of the terminal ileum just proximal to the ileocecal valve. As before there is a large stool burden noted within the colon. No colonic wall thickening or inflammation. Vascular/Lymphatic: Aortic atherosclerosis. No signs of abdominopelvic adenopathy. Reproductive: Status post hysterectomy. No adnexal masses. Other: Small volume of free fluid identified within the dependent portion of the pelvis. No pneumoperitoneum. No focal fluid collections. Musculoskeletal: No acute or suspicious osseous findings. Unchanged appearance of mild superior endplate deformity involving the L1 vertebra. IMPRESSION: 1. Examination is positive for small bowel obstruction with transition to decreased caliber distal small bowel at the level of the terminal ileum just proximal to the ileocecal valve. 2.  Large stool burden noted within the colon compatible with constipation. 3. Gallstone with mild gallbladder distension and mild intrahepatic and extrahepatic bile duct dilatation. Correlate for any clinical signs or symptoms of biliary obstruction. 4. Diffuse increased density throughout the liver may be related to drug therapy such as amiodarone versus hemochromatosis. 5. Small volume of free fluid identified within the dependent portion of the pelvis. 6.  Aortic Atherosclerosis (ICD10-I70.0). Electronically Signed   By: Kerby Moors M.D.   On: 03/19/2022 15:34   DG Abdomen 1 View  Result Date: 03/19/2022 CLINICAL DATA:  Obstruction EXAM: ABDOMEN - 1 VIEW COMPARISON:  03/17/2022 FINDINGS: No significant change in bowel gas pattern with diffusely distended small bowel and colon throughout the abdomen and pelvis, largest loops of small bowel measuring 5.0 cm,: Measuring up to 5.7 cm. Large burden of stool throughout the colon with gas present to the rectum. No free air in the abdomen. IMPRESSION: No significant change in bowel gas pattern with diffusely distended small bowel and colon throughout the abdomen and pelvis. Large burden of stool throughout the colon with gas present to the rectum. Electronically Signed   By: Delanna Ahmadi M.D.   On: 03/19/2022 13:54   CT ABDOMEN PELVIS W CONTRAST  Result Date: 03/17/2022 CLINICAL DATA:  Abdominal pain, acute, nonlocalized constipation. Constipation EXAM: CT ABDOMEN AND PELVIS WITH CONTRAST TECHNIQUE: Multidetector CT imaging of the abdomen and pelvis was performed using the standard protocol following bolus administration of intravenous contrast. RADIATION DOSE REDUCTION: This exam was performed according to the departmental dose-optimization program which includes automated exposure control, adjustment of the mA and/or kV according to patient size and/or use of iterative reconstruction technique. CONTRAST:  75m OMNIPAQUE IOHEXOL 300 MG/ML  SOLN COMPARISON:   02/19/2022 FINDINGS: Lower chest: Cardiac size within normal limits. Pacer leads are noted within the right ventricle. Left ventricular apical aneurysm again noted. No pericardial effusion. Small hiatal hernia. Visualized lung bases are clear. Hepatobiliary: Cholelithiasis without pericholecystic inflammatory change. Hepatic parenchymal hyperdensity again noted diffusely, possibly related to chronic drug therapy, such as amiodarone, or depositional disease. No focal intrahepatic mass. No intra or extrahepatic biliary ductal dilation. Pancreas: Unremarkable. No pancreatic ductal dilatation or surrounding inflammatory changes. Spleen: Unremarkable Adrenals/Urinary Tract: Adrenal glands are unremarkable. Kidneys are normal, without renal calculi, focal lesion, or hydronephrosis. Bladder is unremarkable. Stomach/Bowel: There is progressive large volume stool within the ascending and transverse colon. The distal colon is relatively decompressed. No obstructing lesion is identified. The stomach, small bowel, and large  bowel are otherwise unremarkable. The appendix is absent. No free intraperitoneal gas or fluid. Vascular/Lymphatic: Aortic atherosclerosis. No enlarged abdominal or pelvic lymph nodes. Reproductive: Status post hysterectomy. No adnexal masses. Other: No abdominal wall hernia. Musculoskeletal: No acute bone abnormality. No lytic or blastic bone lesion. Osseous structures are age-appropriate. IMPRESSION: 1. Progressive large volume stool within the ascending and transverse colon. No obstructing lesion is identified. 2. Cholelithiasis. 3. Left ventricular apical aneurysm again noted. 4. Hepatic parenchymal hyperdensity, possibly related to chronic drug therapy, such as amiodarone, or depositional disease. 5. Aortic atherosclerosis. Aortic Atherosclerosis (ICD10-I70.0). Electronically Signed   By: Fidela Salisbury M.D.   On: 03/17/2022 20:30    Procedures Procedures    Medications Ordered in ED Medications   iohexol (OMNIPAQUE) 350 MG/ML injection 75 mL (75 mLs Intravenous Contrast Given 03/19/22 1508)  sodium chloride 0.9 % bolus 1,000 mL (1,000 mLs Intravenous New Bag/Given 03/19/22 1823)    ED Course/ Medical Decision Making/ A&P Clinical Course as of 03/19/22 1955  Tue Mar 19, 2022  1832 Consulted with Dr. Rosendo Gros of general surgery, reviewed imaging and patient's workup/history in detail.  There is radiology interpretation of CT imaging is suggestive of SBO, he believes imaging suggestive of severe stool burden.  Does not recommend surgical intervention at this time.  May follow as needed.  SMOG enema recommended.   [AC]  1916 Consulted with Dr. Michail Sermon gastroenterology, reviewed patient's case and workup in detail.  CT findings suggest possible biliary obstruction, though labs reassuring, MRCP not recommended at this time.  Though with continued constipation, worsening stool burden, inability to eat/keep down fluids without N/V, and multiple failed attempts of enema are concerning.  Recommends another enema tonight, in conjunction with Dr. Rosendo Gros.  Agrees with plan for admission, GI plans to follow first thing in the morning.  Recommended consideration of NG tube. [AC]  62 Consulted with Dr. Claria Dice of hospitalist team.  Discussed patient's case and recommendations of general surgery and gastroenterology in detail.  Agrees with plan for admission. [AC]    Clinical Course User Index [AC] Prince Rome, PA-C                             Medical Decision Making  87 y.o. female presents to the ED for concern of Constipation   This involves an extensive number of treatment options, and is a complaint that carries with it a high risk of complications and morbidity.  The emergent differential diagnosis prior to evaluation includes, but is not limited to: Small bowel obstruction, bowel perforation, renal calculi, constipation, dehydration   This is not an exhaustive differential.    Past  Medical History / Co-morbidities / Social History: Hx of chronic constipation, CHF, hypothyroidism, pacemaker, mitral valve insufficiency, HTN, paroxysmal A-fib Social Determinants of Health include: Geriatric   Additional History:  Obtained by chart review.  Notably recent ED visits and imaging results, see for details   Lab Tests: I ordered, and personally interpreted labs.  The pertinent results include:   Mild anemia, stable Sodium 125, continued decline over the last month from 128, 131, 135 Lipase 29   Imaging Studies: I ordered imaging studies including DG abdomen, and CT abd/p.   I independently visualized and interpreted imaging which showed  DG abdomen: No significant change in bowel gas pattern with diffusely distended small bowel and colon throughout the abdomen and pelvis. Large burden of stool throughout the colon with gas present  to the rectum CT abd/p: 1. Examination is positive for small bowel obstruction with transition to decreased caliber distal small bowel at the level of the terminal ileum just proximal to the ileocecal valve. 2. Large stool burden noted within the colon compatible with constipation. 3. Gallstone with mild gallbladder distension and mild intrahepatic and extrahepatic bile duct dilatation. Correlate for any clinical signs or symptoms of biliary obstruction. 4. Diffuse increased density throughout the liver may be related to drug therapy such as amiodarone versus hemochromatosis. 5. Small volume of free fluid identified within the dependent portion of the pelvis. 6.  Aortic Atherosclerosis I agree with the radiologist interpretation.   ED Course: Pt uncomfortable appearing on exam.  Returning to the ED for worsening constipation, still unable to pass stool.  Now with nausea and vomiting with any attempts at eating or drinking.  Unable to keep down medications.  Reports 7 to 8 pound weight gain within the last 1 to 2 weeks.  Recent ED visit 2 days ago for  same, soapsuds enema unsuccessful at that time.  Has tried multiple rounds of MiraLAX and Senokot outpatient without any relief.  Now believes unable to pass flatulence.  Afebrile, HDS.  Reports significant abdominal tenderness. Reviewed labs.  Worsening hyponatremia, mild elevated AST and ALT, though total bili appears normal.  Mild anemia stable.  CT abdomen and pelvis pending. Radiology interpretation of CT imaging suggestive of SBO with possible biliary obstruction.  Plan to consult with general surgery and GI for SBO and consideration for MRCP.  Bolus of IVF provided. Consulted with general surgery and GI, see notes above.  Plan for admission for inability of p.o. intake, worsening hyponatremia and urinary output likely due to dehydration, significant widely dispersed stool burden with multiple unsuccessful outpatient and hospital attempts for disimpaction. Consulted with hospitalist team, see note above, agrees with plan for admission.   Disposition: Admission.   I discussed this case with my attending physician Dr. Nechama Guard.  Attending physician stated agreement with plan or made changes to plan which were implemented.       This chart was dictated using voice recognition software.  Despite best efforts to proofread, errors can occur which can change the documentation meaning.        Final Clinical Impression(s) / ED Diagnoses Final diagnoses:  Constipation, unspecified constipation type  Hyponatremia  Nausea and vomiting, unspecified vomiting type  Generalized abdominal pain    Rx / DC Orders ED Discharge Orders     None         Candace Cruise [id number] Penelope Coop, MD 03/19/22 2306

## 2022-03-19 NOTE — ED Provider Triage Note (Signed)
Emergency Medicine Provider Triage Evaluation Note  Paula [name] , a 87 y.o. female  was evaluated in triage.  Patient complains of constipation.  She was seen for this 4 days ago and did not have an obstruction.  Nursing tried to treat her with an enema at that time that was unsuccessful.  She wanted to go home.  Now she says that she is no longer passing gas.  Is nauseated and vomiting and unable to hold down any of her medications.  No abdominal surgery or opioids on board  Called her PCP who said that she may need to be reevaluated  Review of Systems  Positive:  Negative:   Physical Exam  BP 133/65 (BP Location: Right Arm)   Pulse 61   Temp 98.2 F (36.8 C)   Resp 18   SpO2 99%  Gen:   Awake, no distress   Resp:  Normal effort  MSK:   Moves extremities without difficulty  Other:  Distended abdomen, relatively nontender  Medical Decision Making  Medically screening exam initiated at 1:26 PM.  Appropriate orders placed.  Vickey Solesbee was informed that the remainder of the evaluation will be completed by another provider, this initial triage assessment does not replace that evaluation, and the importance of remaining in the ED until their evaluation is complete.     Rhae Hammock, PA-C 03/19/22 1328

## 2022-03-19 NOTE — H&P (Signed)
PCP:   Kathalene Frames, MD   Chief Complaint:  Constipation  HPI: This is a 87 year old female with PMHx of constipation, apical hypertrophic cardiomyopathy, paroxysmal atrial fibrillation, HTN, hypothyroidism, and PPM insertion.  This is patient's third visit to the ER this year for constipation.  On her first visit 02/14/2022 she was discharged with GoLytely.  Per patient it did not work.  She was in the ER again 03/17/2022, she was given an enema with some relief and discharged to follow-up with GI and continue MiraLAX.  She states she has been taking MiraLAX but has not had a BM.  She has been bloated for weeks, and she states she has gained 7 pounds since her constipation over the past month.  Over the past week she has developed nausea and anorexia which she believes is related to her constipation.  She returned to the ER.  The patient has a history of constipation, initially was taking MiraLAX as needed, but for the past year she has had to take it daily.  She has a history of hypothyroidism but states it is well-controlled.  She has not had a colonoscopy and perhaps 15 years, because of her age.  In the ER, CT abdomen pelvis is suggestive of severe constipation with small bowel obstruction with transition point.  Surgeon on-call contacted, he is doubtful this is a true obstruction but more related to her severe constipation.  He states he will follow-up with her.  She has mildly elevated AST/ALT 58/51.  GI was contacted by EDP, ?  Bili obstruction.   They do not see anything that needs intervention.  They state they will follow along but recommend treating constipation with smog enema and NG tube if needed  Review of Systems:  The patient denies fever, weight loss, vision loss, decreased hearing, hoarseness, chest pain, syncope, dyspnea on exertion, peripheral edema, balance deficits, hemoptysis, melena, hematochezia, severe indigestion/heartburn, hematuria, incontinence, genital sores,  muscle weakness, suspicious skin lesions, transient blindness, difficulty walking, depression, abnormal bleeding, enlarged lymph nodes, angioedema, and breast masses. Positive: Constipation, nausea, weight gain, abdominal tightness  Past Medical History: Past Medical History:  Diagnosis Date   CHF (congestive heart failure) (HCC)    Hypothyroidism    Irregular heart rate    Thyroid disease    Past Surgical History:  Procedure Laterality Date   PACEMAKER IMPLANT N/A 07/17/2020   Procedure: PACEMAKER IMPLANT;  Surgeon: Evans Lance, MD;  Location: [facility];  Service: Cardiovascular;  Laterality: N/A;    Medications: Prior to Admission medications   Medication Sig Start Date End Date Taking? Authorizing Provider  acetaminophen (TYLENOL) 325 MG tablet Take 2 tablets (650 mg total) by mouth every 4 (four) hours as needed for headache or mild pain. 07/18/20   Shirley Friar, PA-C  amiodarone (PACERONE) 200 MG tablet TAKE 1 TABLET EVERY DAY EXCEPT SUNDAY 03/06/22   Donato Heinz, MD  calcium carbonate (OS-CAL - DOSED IN MG OF ELEMENTAL CALCIUM) 1250 (500 Ca) MG tablet Take 1 tablet by mouth daily with breakfast.    [provider]  diltiazem (CARDIZEM CD) 300 MG 24 hr capsule TAKE 1 CAPSULE EVERY DAY 07/04/21   Donato Heinz, MD  ELIQUIS 5 MG TABS tablet Take 1 tablet (5 mg total) by mouth in the morning and at bedtime. 07/23/20   Shirley Friar, PA-C  famotidine (PEPCID) 10 MG tablet Take 10 mg by mouth at bedtime.    [provider]  fluticasone (  FLONASE) 50 MCG/ACT nasal spray Place 1 spray into both nostrils at bedtime. 10/17/21   Chesley Mires, MD  levothyroxine (SYNTHROID, LEVOTHROID) 88 MCG tablet Take 88 mcg by mouth daily before breakfast.    [provider]  Multiple Vitamin (MULTIVITAMIN WITH MINERALS) TABS tablet Take 1 tablet by mouth daily.    [provider]  polyethylene glycol powder (MIRALAX) 17  GM/SCOOP powder Take by mouth as needed.    [provider]  prazosin (MINIPRESS) 1 MG capsule TAKE 1 CAPSULE AT BEDTIME 08/15/21   Donato Heinz, MD  rosuvastatin (CRESTOR) 5 MG tablet Take 1 tablet (5 mg total) by mouth daily. 10/29/21   Donato Heinz, MD  sodium chloride (OCEAN) 0.65 % SOLN nasal spray Place 1 spray into both nostrils as needed for congestion.    [provider]  valsartan (DIOVAN) 160 MG tablet Take 1 tablet (160 mg total) by mouth daily. 02/18/22   Donato Heinz, MD    Allergies:   Allergies  Allergen Reactions   Wound Dressing Adhesive     Other reaction(s): rash    Social History:  reports that she has never smoked. She has never used smokeless tobacco. She reports current alcohol use of about 1.0 standard drink of alcohol per week. She reports that she does not use drugs.  Family History: Family History  Problem Relation Age of Onset   Breast cancer Sister 51    Physical Exam: Vitals:   03/19/22 1827 03/19/22 1827 03/19/22 1828 03/19/22 1900  BP: (!) 163/77 (!) 163/77  139/83  Pulse: 72 73  71  Resp: '14 20  17  '$ Temp:   98 F (36.7 C)   TempSrc:   Oral   SpO2: 100% 98%  98%    General:  Alert and oriented times three, well developed and nourished, no acute distress Eyes: PERRLA, pink conjunctiva, no scleral icterus ENT: Moist oral mucosa, neck supple, no thyromegaly Lungs: clear to ascultation, no wheeze, no crackles, no use of accessory muscles Cardiovascular: regular rate and rhythm, no regurgitation, no gallops, no murmurs. No carotid bruits, no JVD Abdomen: soft, positive BS, no organomegaly, abdomen bloated and and firm.  Nonspecific generalized tenderness to palpation GU: not examined Neuro: CN II - XII grossly intact, sensation intact Musculoskeletal: strength 5/5 all extremities, no clubbing, cyanosis or edema Skin: no rash, no subcutaneous crepitation, no decubitus Psych: appropriate  patient   Labs on Admission:  Recent Labs    03/17/22 1600 03/19/22 1319  NA 128* 125*  K 4.5 4.3  CL 93* 92*  CO2 26 24  GLUCOSE 97 116*  BUN 13 16  CREATININE 0.97 1.02*  CALCIUM 9.4 8.9   Recent Labs    03/17/22 1600 03/19/22 1319  AST 57* 58*  ALT 50* 51*  ALKPHOS 69 75  BILITOT 0.8 0.7  PROT 7.8 7.1  ALBUMIN 4.2 3.4*   Recent Labs    03/19/22 1319  LIPASE 29   Recent Labs    03/17/22 1600 03/19/22 1319  WBC 7.9 7.9  NEUTROABS 5.8  --   HGB 10.6* 10.9*  HCT 32.3* 33.1*  MCV 78.8* 78.6*  PLT 314 344    Radiological Exams on Admission: CT ABDOMEN PELVIS W CONTRAST  Result Date: 03/19/2022 CLINICAL DATA:  Abdominal pain.  Constipation. EXAM: CT ABDOMEN AND PELVIS WITH CONTRAST TECHNIQUE: Multidetector CT imaging of the abdomen and pelvis was performed using the standard protocol following bolus administration of intravenous contrast. RADIATION DOSE REDUCTION:  This exam was performed according to the departmental dose-optimization program which includes automated exposure control, adjustment of the mA and/or kV according to patient size and/or use of iterative reconstruction technique. CONTRAST:  43m OMNIPAQUE IOHEXOL 350 MG/ML SOLN COMPARISON:  03/17/22 FINDINGS: Lower chest: No acute abnormality. Scarring noted within both lung bases. No pleural fluid or airspace disease. Hepatobiliary: Again noted is diffuse increased density throughout the liver which may be related to drug therapy such as amiodarone versus hemochromatosis. No focal liver lesion identified. Gallbladder is mildly distended. Stone within the gallbladder measures 2.5 cm. No pericholecystic inflammation. Increase caliber of the common bile duct measures 8 mm. Mild intrahepatic bile duct dilatation is similar to the previous exam. Pancreas: Unremarkable. No pancreatic ductal dilatation or surrounding inflammatory changes. Spleen: Normal in size without focal abnormality. Adrenals/Urinary Tract: Normal  adrenal glands. No signs of nephrolithiasis, hydronephrosis or mass. Bladder is unremarkable. Stomach/Bowel: Small hiatal hernia. Stomach appears decompressed. The duodenum and proximal jejunal bowel loops are normal in caliber. Mid small bowel loops are abnormally dilated and contain multiple air-fluid levels which measure up to 3.7 cm. Transition to decreased caliber distal small bowel is noted within the right lower quadrant at the level of the terminal ileum just proximal to the ileocecal valve. As before there is a large stool burden noted within the colon. No colonic wall thickening or inflammation. Vascular/Lymphatic: Aortic atherosclerosis. No signs of abdominopelvic adenopathy. Reproductive: Status post hysterectomy. No adnexal masses. Other: Small volume of free fluid identified within the dependent portion of the pelvis. No pneumoperitoneum. No focal fluid collections. Musculoskeletal: No acute or suspicious osseous findings. Unchanged appearance of mild superior endplate deformity involving the L1 vertebra. IMPRESSION: 1. Examination is positive for small bowel obstruction with transition to decreased caliber distal small bowel at the level of the terminal ileum just proximal to the ileocecal valve. 2. Large stool burden noted within the colon compatible with constipation. 3. Gallstone with mild gallbladder distension and mild intrahepatic and extrahepatic bile duct dilatation. Correlate for any clinical signs or symptoms of biliary obstruction. 4. Diffuse increased density throughout the liver may be related to drug therapy such as amiodarone versus hemochromatosis. 5. Small volume of free fluid identified within the dependent portion of the pelvis. 6.  Aortic Atherosclerosis (ICD10-I70.0). Electronically Signed   By: TKerby MoorsM.D.   On: 03/19/2022 15:34   DG Abdomen 1 View  Result Date: 03/19/2022 CLINICAL DATA:  Obstruction EXAM: ABDOMEN - 1 VIEW COMPARISON:  03/17/2022 FINDINGS: No  significant change in bowel gas pattern with diffusely distended small bowel and colon throughout the abdomen and pelvis, largest loops of small bowel measuring 5.0 cm,: Measuring up to 5.7 cm. Large burden of stool throughout the colon with gas present to the rectum. No free air in the abdomen. IMPRESSION: No significant change in bowel gas pattern with diffusely distended small bowel and colon throughout the abdomen and pelvis. Large burden of stool throughout the colon with gas present to the rectum. Electronically Signed   By: ADelanna AhmadiM.D.   On: 03/19/2022 13:54   CT ABDOMEN PELVIS W CONTRAST  Result Date: 03/17/2022 CLINICAL DATA:  Abdominal pain, acute, nonlocalized constipation. Constipation EXAM: CT ABDOMEN AND PELVIS WITH CONTRAST TECHNIQUE: Multidetector CT imaging of the abdomen and pelvis was performed using the standard protocol following bolus administration of intravenous contrast. RADIATION DOSE REDUCTION: This exam was performed according to the departmental dose-optimization program which includes automated exposure control, adjustment of the mA and/or kV according  to patient size and/or use of iterative reconstruction technique. CONTRAST:  59m OMNIPAQUE IOHEXOL 300 MG/ML  SOLN COMPARISON:  02/19/2022 FINDINGS: Lower chest: Cardiac size within normal limits. Pacer leads are noted within the right ventricle. Left ventricular apical aneurysm again noted. No pericardial effusion. Small hiatal hernia. Visualized lung bases are clear. Hepatobiliary: Cholelithiasis without pericholecystic inflammatory change. Hepatic parenchymal hyperdensity again noted diffusely, possibly related to chronic drug therapy, such as amiodarone, or depositional disease. No focal intrahepatic mass. No intra or extrahepatic biliary ductal dilation. Pancreas: Unremarkable. No pancreatic ductal dilatation or surrounding inflammatory changes. Spleen: Unremarkable Adrenals/Urinary Tract: Adrenal glands are unremarkable.  Kidneys are normal, without renal calculi, focal lesion, or hydronephrosis. Bladder is unremarkable. Stomach/Bowel: There is progressive large volume stool within the ascending and transverse colon. The distal colon is relatively decompressed. No obstructing lesion is identified. The stomach, small bowel, and large bowel are otherwise unremarkable. The appendix is absent. No free intraperitoneal gas or fluid. Vascular/Lymphatic: Aortic atherosclerosis. No enlarged abdominal or pelvic lymph nodes. Reproductive: Status post hysterectomy. No adnexal masses. Other: No abdominal wall hernia. Musculoskeletal: No acute bone abnormality. No lytic or blastic bone lesion. Osseous structures are age-appropriate. IMPRESSION: 1. Progressive large volume stool within the ascending and transverse colon. No obstructing lesion is identified. 2. Cholelithiasis. 3. Left ventricular apical aneurysm again noted. 4. Hepatic parenchymal hyperdensity, possibly related to chronic drug therapy, such as amiodarone, or depositional disease. 5. Aortic atherosclerosis. Aortic Atherosclerosis (ICD10-I70.0). Electronically Signed   By: AFidela SalisburyM.D.   On: 03/17/2022 20:30    Assessment/Plan Present on Admission:  Severe constipation -N.p.o., IV fluid hydration -TSH ordered -Will treat from below, smog enema x 1 now, repeat in a.m.  After she has had a BM will begin MiraLAX p.o. twice daily and lactulose -Repeat KUB in a.m. -NG tube not placed this patient currently not nauseous or vomiting.  Will reassess -CMP in a.m. CEA ordered -Surgery and GI consulted, will follow in a.m.   Hyponatremia -TSH, urine sodium, urine osmolality -IV fluid hydration ordered -BMP in a.m.   Paroxysmal atrial fibrillation (HCC) -Amiodarone, Eliquis, Cardizem resumed -CT abdomen pelvis shows diffuse intensity throughout the liver related to drug therapy such as amiodarone.   Hyperlipidemia -Crestor resumed   Hypertension -Stable,  valsartan, Minipress, Cardizem resumed -As needed hydralazine  Hypothyroidism -Stable, Synthroid resumed   Pacemaker   Larz Mark 03/19/2022, 7:56 PM

## 2022-03-20 ENCOUNTER — Observation Stay (HOSPITAL_COMMUNITY): Payer: Medicare HMO

## 2022-03-20 ENCOUNTER — Other Ambulatory Visit (HOSPITAL_COMMUNITY): Payer: Self-pay

## 2022-03-20 DIAGNOSIS — R112 Nausea with vomiting, unspecified: Secondary | ICD-10-CM | POA: Diagnosis not present

## 2022-03-20 DIAGNOSIS — E871 Hypo-osmolality and hyponatremia: Secondary | ICD-10-CM | POA: Diagnosis not present

## 2022-03-20 DIAGNOSIS — R1084 Generalized abdominal pain: Secondary | ICD-10-CM | POA: Diagnosis not present

## 2022-03-20 DIAGNOSIS — K5904 Chronic idiopathic constipation: Secondary | ICD-10-CM

## 2022-03-20 DIAGNOSIS — K5900 Constipation, unspecified: Secondary | ICD-10-CM | POA: Diagnosis not present

## 2022-03-20 LAB — COMPREHENSIVE METABOLIC PANEL
ALT: 55 U/L — ABNORMAL HIGH (ref 0–44)
AST: 58 U/L — ABNORMAL HIGH (ref 15–41)
Albumin: 3.3 g/dL — ABNORMAL LOW (ref 3.5–5.0)
Alkaline Phosphatase: 82 U/L (ref 38–126)
Anion gap: 11 (ref 5–15)
BUN: 15 mg/dL (ref 8–23)
CO2: 24 mmol/L (ref 22–32)
Calcium: 8.5 mg/dL — ABNORMAL LOW (ref 8.9–10.3)
Chloride: 92 mmol/L — ABNORMAL LOW (ref 98–111)
Creatinine, Ser: 1 mg/dL (ref 0.44–1.00)
GFR, Estimated: 56 mL/min — ABNORMAL LOW (ref >60)
Glucose, Bld: 105 mg/dL — ABNORMAL HIGH (ref 70–99)
Potassium: 3.9 mmol/L (ref 3.5–5.1)
Sodium: 127 mmol/L — ABNORMAL LOW (ref 135–145)
Total Bilirubin: 0.9 mg/dL (ref 0.3–1.2)
Total Protein: 7.4 g/dL (ref 6.5–8.1)

## 2022-03-20 LAB — CBC WITH DIFFERENTIAL/PLATELET
Abs Immature Granulocytes: 0.02 10*3/uL (ref 0.00–0.07)
Basophils Absolute: 0 10*3/uL (ref 0.0–0.1)
Basophils Relative: 0 %
Eosinophils Absolute: 0 10*3/uL (ref 0.0–0.5)
Eosinophils Relative: 0 %
HCT: 34.7 % — ABNORMAL LOW (ref 36.0–46.0)
Hemoglobin: 11.3 g/dL — ABNORMAL LOW (ref 12.0–15.0)
Immature Granulocytes: 0 %
Lymphocytes Relative: 3 %
Lymphs Abs: 0.4 10*3/uL — ABNORMAL LOW (ref 0.7–4.0)
MCH: 25.7 pg — ABNORMAL LOW (ref 26.0–34.0)
MCHC: 32.6 g/dL (ref 30.0–36.0)
MCV: 78.9 fL — ABNORMAL LOW (ref 80.0–100.0)
Monocytes Absolute: 0.9 10*3/uL (ref 0.1–1.0)
Monocytes Relative: 8 %
Neutro Abs: 10.2 10*3/uL — ABNORMAL HIGH (ref 1.7–7.7)
Neutrophils Relative %: 89 %
Platelets: 328 10*3/uL (ref 150–400)
RBC: 4.4 MIL/uL (ref 3.87–5.11)
RDW: 15.9 % — ABNORMAL HIGH (ref 11.5–15.5)
WBC: 11.5 10*3/uL — ABNORMAL HIGH (ref 4.0–10.5)
nRBC: 0 % (ref 0.0–0.2)

## 2022-03-20 MED ORDER — LINACLOTIDE 145 MCG PO CAPS: 290.0000 ug | ORAL_CAPSULE | Freq: Every day | ORAL | Status: DC

## 2022-03-20 MED ORDER — ONDANSETRON HCL 4 MG/2ML IJ SOLN: 4.0000 mg | Freq: Four times a day (QID) | INTRAMUSCULAR | Status: DC | PRN

## 2022-03-20 MED ORDER — POLYETHYLENE GLYCOL 3350 17 G PO PACK
17.0000 g | PACK | Freq: Two times a day (BID) | ORAL | Status: DC
  Administered 2022-03-20: 17 g via ORAL
  Filled 2022-03-20: qty 1

## 2022-03-20 MED ORDER — PRAZOSIN HCL 1 MG PO CAPS
1.0000 mg | ORAL_CAPSULE | Freq: Every day | ORAL | Status: DC
  Filled 2022-03-20: qty 1

## 2022-03-20 MED ORDER — FAMOTIDINE 20 MG PO TABS: 10.0000 mg | ORAL_TABLET | Freq: Every day | ORAL | Status: DC

## 2022-03-20 MED ORDER — SODIUM CHLORIDE 0.9 % IV SOLN: INTRAVENOUS | Status: AC

## 2022-03-20 MED ORDER — LACTULOSE 10 GM/15ML PO SOLN: 20.0000 g | Freq: Every day | ORAL | Status: DC

## 2022-03-20 MED ORDER — ACETAMINOPHEN 650 MG RE SUPP: 650.0000 mg | Freq: Four times a day (QID) | RECTAL | Status: DC | PRN

## 2022-03-20 MED ORDER — IRBESARTAN 75 MG PO TABS
75.0000 mg | ORAL_TABLET | Freq: Every day | ORAL | Status: DC
  Administered 2022-03-20: 75 mg via ORAL
  Filled 2022-03-20: qty 1

## 2022-03-20 MED ORDER — ROSUVASTATIN CALCIUM 5 MG PO TABS
5.0000 mg | ORAL_TABLET | Freq: Every day | ORAL | Status: DC
  Administered 2022-03-20: 5 mg via ORAL
  Filled 2022-03-20: qty 1

## 2022-03-20 MED ORDER — POLYETHYLENE GLYCOL 3350 17 G PO PACK: 17.0000 g | PACK | Freq: Two times a day (BID) | ORAL | Status: DC

## 2022-03-20 MED ORDER — POLYETHYLENE GLYCOL 3350 17 GM/SCOOP PO POWD
17.0000 g | Freq: Two times a day (BID) | ORAL | 0 refills | Status: DC
  Filled 2022-03-20: qty 238, 7d supply, fill #0

## 2022-03-20 MED ORDER — ONDANSETRON HCL 4 MG PO TABS: 4.0000 mg | ORAL_TABLET | Freq: Four times a day (QID) | ORAL | Status: DC | PRN

## 2022-03-20 MED ORDER — LINACLOTIDE 290 MCG PO CAPS
290.0000 ug | ORAL_CAPSULE | Freq: Every day | ORAL | 0 refills | Status: AC
  Filled 2022-03-20: qty 30, 30d supply, fill #0

## 2022-03-20 MED ORDER — ACETAMINOPHEN 325 MG PO TABS: 650.0000 mg | ORAL_TABLET | Freq: Four times a day (QID) | ORAL | Status: DC | PRN

## 2022-03-20 NOTE — Progress Notes (Signed)
[facility] PROGRESS NOTE    Progress Note  Symphanie [name]  [id number] DOB: 1935-04-09 DOA: 03/19/2022 PCP: Kathalene Frames, MD     Brief Narrative:   Paula [name] is an 87 y.o. female past medical history of constipation, hypertrophic cardiomyopathy, paroxysmal atrial fibrillation on Eliquis, with a pacemaker comes into the ED for constipation, she has been in the ED several times was given GoLytely and MiraLAX and she relates he has not worked.  CT scan showed severe constipation with a concern of small bowel obstruction with a transition point, general surgeon was consulted and he is doubtful there is an obstruction recommended conservative management.,  GI was consulted by EDP they do not see any need for intervention.    Assessment/Plan:   Severe Constipation: Currently n.p.o. on IV fluids. She was started on a smog and repeat Smog in the morning and also MiraLAX p.o. twice daily NG tube was not placed as the patient is not nauseated. CEA was ordered. She has a multiple bowel movement. Will continue to monitor 24 additional hours. Repeat x-ray in the morning.  Hypovolemic hyponatremia: Will start on IV fluids her sodium is improving slowly.  Paroxysmal atrial fibrillation: Continue amiodarone Eliquis and Cardizem.  Hyperlipidemia: Resume Crestor.  Essential hypertension: Continue losartan Minipress Cardizem, she was started on hydralazine as needed as needed.  History of pacemaker: Noted.  DVT prophylaxis: lovenox Family Communication:none Status is: Observation The patient remains OBS appropriate and will d/c before 2 midnights.    Code Status:     Code Status Orders  (From admission, onward)           Start     Ordered   03/20/22 0127  Full code  Continuous       Question:  By:  Answer:  Consent: discussion documented in EHR   03/20/22 0126           Code Status History     Date Active Date Inactive Code  Status Order ID Comments User Context   07/15/2020 1608 07/18/2020 1607 Full Code [email or web address]  Liliane Shi, PA-C Inpatient         IV Access:   Peripheral IV   Procedures and diagnostic studies:   CT ABDOMEN PELVIS W CONTRAST  Result Date: 03/19/2022 CLINICAL DATA:  Abdominal pain.  Constipation. EXAM: CT ABDOMEN AND PELVIS WITH CONTRAST TECHNIQUE: Multidetector CT imaging of the abdomen and pelvis was performed using the standard protocol following bolus administration of intravenous contrast. RADIATION DOSE REDUCTION: This exam was performed according to the departmental dose-optimization program which includes automated exposure control, adjustment of the mA and/or kV according to patient size and/or use of iterative reconstruction technique. CONTRAST:  71m OMNIPAQUE IOHEXOL 350 MG/ML SOLN COMPARISON:  03/17/22 FINDINGS: Lower chest: No acute abnormality. Scarring noted within both lung bases. No pleural fluid or airspace disease. Hepatobiliary: Again noted is diffuse increased density throughout the liver which may be related to drug therapy such as amiodarone versus hemochromatosis. No focal liver lesion identified. Gallbladder is mildly distended. Stone within the gallbladder measures 2.5 cm. No pericholecystic inflammation. Increase caliber of the common bile duct measures 8 mm. Mild intrahepatic bile duct dilatation is similar to the previous exam. Pancreas: Unremarkable. No pancreatic ductal dilatation or surrounding inflammatory changes. Spleen: Normal in size without focal abnormality. Adrenals/Urinary Tract: Normal adrenal glands. No signs of nephrolithiasis, hydronephrosis or mass. Bladder is unremarkable. Stomach/Bowel: Small hiatal hernia. Stomach appears decompressed. The duodenum and proximal jejunal bowel loops  are normal in caliber. Mid small bowel loops are abnormally dilated and contain multiple air-fluid levels which measure up to 3.7 cm. Transition to decreased caliber distal  small bowel is noted within the right lower quadrant at the level of the terminal ileum just proximal to the ileocecal valve. As before there is a large stool burden noted within the colon. No colonic wall thickening or inflammation. Vascular/Lymphatic: Aortic atherosclerosis. No signs of abdominopelvic adenopathy. Reproductive: Status post hysterectomy. No adnexal masses. Other: Small volume of free fluid identified within the dependent portion of the pelvis. No pneumoperitoneum. No focal fluid collections. Musculoskeletal: No acute or suspicious osseous findings. Unchanged appearance of mild superior endplate deformity involving the L1 vertebra. IMPRESSION: 1. Examination is positive for small bowel obstruction with transition to decreased caliber distal small bowel at the level of the terminal ileum just proximal to the ileocecal valve. 2. Large stool burden noted within the colon compatible with constipation. 3. Gallstone with mild gallbladder distension and mild intrahepatic and extrahepatic bile duct dilatation. Correlate for any clinical signs or symptoms of biliary obstruction. 4. Diffuse increased density throughout the liver may be related to drug therapy such as amiodarone versus hemochromatosis. 5. Small volume of free fluid identified within the dependent portion of the pelvis. 6.  Aortic Atherosclerosis (ICD10-I70.0). Electronically Signed   By: Kerby Moors M.D.   On: 03/19/2022 15:34   DG Abdomen 1 View  Result Date: 03/19/2022 CLINICAL DATA:  Obstruction EXAM: ABDOMEN - 1 VIEW COMPARISON:  03/17/2022 FINDINGS: No significant change in bowel gas pattern with diffusely distended small bowel and colon throughout the abdomen and pelvis, largest loops of small bowel measuring 5.0 cm,: Measuring up to 5.7 cm. Large burden of stool throughout the colon with gas present to the rectum. No free air in the abdomen. IMPRESSION: No significant change in bowel gas pattern with diffusely distended small  bowel and colon throughout the abdomen and pelvis. Large burden of stool throughout the colon with gas present to the rectum. Electronically Signed   By: Delanna Ahmadi M.D.   On: 03/19/2022 13:54     Medical Consultants:   None.   Subjective:    Paula [name] she relates she has had several bowel movements.  Objective:    Vitals:   03/19/22 2130 03/19/22 2214 03/20/22 0033 03/20/22 0430  BP: (!) 143/59 (!) 147/55 (!) 151/61 (!) 151/59  Pulse: 63 69 73 73  Resp: 20 20    Temp:  98.3 F (36.8 C) 97.9 F (36.6 C) 98.1 F (36.7 C)  TempSrc:  Oral Oral Oral  SpO2: 96% 96% 98% 98%  Weight:  61.4 kg     SpO2: 98 %   Intake/Output Summary (Last 24 hours) at 03/20/2022 0651 Last data filed at 03/20/2022 0304 Gross per 24 hour  Intake 279.16 ml  Output --  Net 279.16 ml   Filed Weights   03/19/22 2214  Weight: 61.4 kg    Exam: General exam: In no acute distress. Respiratory system: Good air movement and clear to auscultation. Cardiovascular system: S1 & S2 heard, RRR. No JVD. Gastrointestinal system: Abdomen is nondistended, soft and nontender.  Extremities: No pedal edema. Skin: No rashes, lesions or ulcers Psychiatry: Judgement and insight appear normal. Mood & affect appropriate.    Data Reviewed:    Labs: Basic Metabolic Panel: Recent Labs  Lab 03/17/22 1600 03/19/22 1319 03/20/22 0418  NA 128* 125* 127*  K 4.5 4.3 3.9  CL 93* 92* 92*  CO2 '26 24 24  '$ GLUCOSE 97 116* 105*  BUN '13 16 15  '$ CREATININE 0.97 1.02* 1.00  CALCIUM 9.4 8.9 8.5*   GFR Estimated Creatinine Clearance: 36.2 mL/min (by C-G formula based on SCr of 1 mg/dL). Liver Function Tests: Recent Labs  Lab 03/17/22 1600 03/19/22 1319 03/20/22 0418  AST 57* 58* 58*  ALT 50* 51* 55*  ALKPHOS 69 75 82  BILITOT 0.8 0.7 0.9  PROT 7.8 7.1 7.4  ALBUMIN 4.2 3.4* 3.3*   Recent Labs  Lab 03/19/22 1319  LIPASE 29   No results for input(s): "AMMONIA" in the last 168  hours. Coagulation profile No results for input(s): "INR", "PROTIME" in the last 168 hours. COVID-19 Labs  No results for input(s): "DDIMER", "FERRITIN", "LDH", "CRP" in the last 72 hours.  Lab Results  Component Value Date   SARSCOV2NAA NEGATIVE 07/15/2020   [place] NEGATIVE 04/19/2020    CBC: Recent Labs  Lab 03/17/22 1600 03/19/22 1319 03/20/22 0418  WBC 7.9 7.9 11.5*  NEUTROABS 5.8  --  10.2*  HGB 10.6* 10.9* 11.3*  HCT 32.3* 33.1* 34.7*  MCV 78.8* 78.6* 78.9*  PLT 314 344 328   Cardiac Enzymes: No results for input(s): "CKTOTAL", "CKMB", "CKMBINDEX", "TROPONINI" in the last 168 hours. BNP (last 3 results) No results for input(s): "PROBNP" in the last 8760 hours. CBG: No results for input(s): "GLUCAP" in the last 168 hours. D-Dimer: No results for input(s): "DDIMER" in the last 72 hours. Hgb A1c: No results for input(s): "HGBA1C" in the last 72 hours. Lipid Profile: No results for input(s): "CHOL", "HDL", "LDLCALC", "TRIG", "CHOLHDL", "LDLDIRECT" in the last 72 hours. Thyroid function studies: Recent Labs    03/19/22 2138  TSH 3.074   Anemia work up: No results for input(s): "VITAMINB12", "FOLATE", "FERRITIN", "TIBC", "IRON", "RETICCTPCT" in the last 72 hours. Sepsis Labs: Recent Labs  Lab 03/17/22 1600 03/19/22 1319 03/20/22 0418  WBC 7.9 7.9 11.5*   Microbiology No results found for this or any previous visit (from the past 240 hour(s)).   Medications:    amiodarone  200 mg Oral Daily   apixaban  5 mg Oral BID   diltiazem  300 mg Oral QPM   famotidine  10 mg Oral QHS   irbesartan  75 mg Oral Daily   lactulose  20 g Oral Daily   levothyroxine  88 mcg Oral QAC breakfast   polyethylene glycol  17 g Oral BID   prazosin  1 mg Oral QHS   rosuvastatin  5 mg Oral Daily   sorbitol, milk of mag, mineral oil, glycerin (SMOG) enema  400 mL Rectal Once   Continuous Infusions:  sodium chloride Stopped (03/20/22 0238)      LOS: 0 days    Charlynne Cousins  [facility]  03/20/2022, 6:51 AM

## 2022-03-20 NOTE — Discharge Summary (Signed)
Physician Discharge Summary  Paula [name] [id number] DOB: [birth year] DOA: 03/19/2022  PCP: Kathalene Frames, MD  Admit date: 03/19/2022 Discharge date: 03/20/2022  Admitted From: Home Disposition:  Home  Recommendations for Outpatient Follow-up:  Follow up with PCP in 1-2 weeks Please obtain BMP/CBC in one week   Home Health:No Equipment/Devices:None  Discharge Condition:Stable CODE STATUS:Full Diet recommendation: Heart Healthy  Brief/Interim Summary:  87 y.o. female past medical history of constipation, hypertrophic cardiomyopathy, paroxysmal atrial fibrillation on Eliquis, with a pacemaker comes into the ED for constipation, she has been in the ED several times was given GoLytely and MiraLAX and she relates he has not worked.  CT scan showed severe constipation with a concern of small bowel obstruction with a transition point, general surgeon was consulted and he is doubtful there is an obstruction recommended conservative management.,  GI was consulted by EDP they do not see any need for intervention.    Discharge Diagnoses:  Principal Problem:   Constipation Active Problems:   Paroxysmal atrial fibrillation (HCC)   Secondary hypercoagulable state ([place])   Hypertension   Pacemaker   Hyponatremia  Severe constipation: She was given a smog enema started on MiraLAX p.o. twice daily and lactulose she has several bowel movement. GI was consulted who agreed with management they recommended to add Lindzen and she will continue Lindzen and MiraLAX as an outpatient x-ray was repeated that she will improve bowel gas pattern.  Hypovolemic hyponatremia: Improved with fluid resuscitation.  Paroxysmal atrial fibrillation: No change made to his medication continue amiodarone Eliquis and Cardizem.  Hyperlipidemia:  Continue Crestor.  Essential hypertension: No changes made to her medication.  Discharge Instructions  Discharge Instructions     Diet - low  sodium heart healthy   Complete by: As directed    Increase activity slowly   Complete by: As directed       Allergies as of 03/20/2022       Reactions   Wound Dressing Adhesive    Other reaction(s): rash        Medication List     TAKE these medications    acetaminophen 325 MG tablet Commonly known as: TYLENOL Take 2 tablets (650 mg total) by mouth every 4 (four) hours as needed for headache or mild pain.   amiodarone 200 MG tablet Commonly known as: PACERONE TAKE 1 TABLET EVERY DAY EXCEPT SUNDAY   calcium carbonate 1250 (500 Ca) MG tablet Commonly known as: OS-CAL - dosed in mg of elemental calcium Take 1 tablet by mouth daily with breakfast.   diltiazem 300 MG 24 hr capsule Commonly known as: CARDIZEM CD TAKE 1 CAPSULE EVERY DAY   Eliquis 5 MG Tabs tablet Generic drug: apixaban Take 1 tablet (5 mg total) by mouth in the morning and at bedtime.   famotidine 10 MG tablet Commonly known as: PEPCID Take 10 mg by mouth at bedtime.   fluticasone 50 MCG/ACT nasal spray Commonly known as: FLONASE Place 1 spray into both nostrils at bedtime.   levothyroxine 88 MCG tablet Commonly known as: SYNTHROID Take 88 mcg by mouth daily before breakfast.   linaclotide 290 MCG Caps capsule Commonly known as: LINZESS Take 1 capsule (290 mcg total) by mouth daily before breakfast. Start taking on: March 21, 2022   MiraLax 17 GM/SCOOP powder Generic drug: polyethylene glycol powder Take by mouth as needed. What changed: Another medication with the same name was added. Make sure you understand how and when to take each.   polyethylene  glycol 17 g packet Commonly known as: MIRALAX / GLYCOLAX Take 17 g by mouth 2 (two) times daily. What changed: You were already taking a medication with the same name, and this prescription was added. Make sure you understand how and when to take each.   multivitamin with minerals Tabs tablet Take 1 tablet by mouth daily.   prazosin 1  MG capsule Commonly known as: MINIPRESS TAKE 1 CAPSULE AT BEDTIME   rosuvastatin 5 MG tablet Commonly known as: CRESTOR Take 1 tablet (5 mg total) by mouth daily.   sodium chloride 0.65 % Soln nasal spray Commonly known as: OCEAN Place 1 spray into both nostrils as needed for congestion.   valsartan 160 MG tablet Commonly known as: DIOVAN Take 1 tablet (160 mg total) by mouth daily.        Follow-up Information     Schedule an appointment as soon as possible for a visit  with Kathalene Frames, MD.   Specialty: Internal Medicine Contact information: [address], [address] [place] [id number] [phone]                Allergies  Allergen Reactions   Wound Dressing Adhesive     Other reaction(s): rash    Consultations: Andee Poles logic she is being discharged no complaints   Procedures/Studies: DG Abd 1 View  Result Date: 03/20/2022 CLINICAL DATA:  Constipation. EXAM: ABDOMEN - 1 VIEW COMPARISON:  March 19, 2022. FINDINGS: No abnormal bowel dilatation is noted. Moderate amount of stool seen throughout the colon. No abnormal calcifications. IMPRESSION: Moderate stool burden.  No abnormal bowel dilatation. Electronically Signed   By: Marijo Conception M.D.   On: 03/20/2022 08:33   CT ABDOMEN PELVIS W CONTRAST  Result Date: 03/19/2022 CLINICAL DATA:  Abdominal pain.  Constipation. EXAM: CT ABDOMEN AND PELVIS WITH CONTRAST TECHNIQUE: Multidetector CT imaging of the abdomen and pelvis was performed using the standard protocol following bolus administration of intravenous contrast. RADIATION DOSE REDUCTION: This exam was performed according to the departmental dose-optimization program which includes automated exposure control, adjustment of the mA and/or kV according to patient size and/or use of iterative reconstruction technique. CONTRAST:  65m OMNIPAQUE IOHEXOL 350 MG/ML SOLN COMPARISON:  03/17/22 FINDINGS: Lower chest: No acute abnormality.  Scarring noted within both lung bases. No pleural fluid or airspace disease. Hepatobiliary: Again noted is diffuse increased density throughout the liver which may be related to drug therapy such as amiodarone versus hemochromatosis. No focal liver lesion identified. Gallbladder is mildly distended. Stone within the gallbladder measures 2.5 cm. No pericholecystic inflammation. Increase caliber of the common bile duct measures 8 mm. Mild intrahepatic bile duct dilatation is similar to the previous exam. Pancreas: Unremarkable. No pancreatic ductal dilatation or surrounding inflammatory changes. Spleen: Normal in size without focal abnormality. Adrenals/Urinary Tract: Normal adrenal glands. No signs of nephrolithiasis, hydronephrosis or mass. Bladder is unremarkable. Stomach/Bowel: Small hiatal hernia. Stomach appears decompressed. The duodenum and proximal jejunal bowel loops are normal in caliber. Mid small bowel loops are abnormally dilated and contain multiple air-fluid levels which measure up to 3.7 cm. Transition to decreased caliber distal small bowel is noted within the right lower quadrant at the level of the terminal ileum just proximal to the ileocecal valve. As before there is a large stool burden noted within the colon. No colonic wall thickening or inflammation. Vascular/Lymphatic: Aortic atherosclerosis. No signs of abdominopelvic adenopathy. Reproductive: Status post hysterectomy. No adnexal masses. Other: Small volume of free fluid identified within  the dependent portion of the pelvis. No pneumoperitoneum. No focal fluid collections. Musculoskeletal: No acute or suspicious osseous findings. Unchanged appearance of mild superior endplate deformity involving the L1 vertebra. IMPRESSION: 1. Examination is positive for small bowel obstruction with transition to decreased caliber distal small bowel at the level of the terminal ileum just proximal to the ileocecal valve. 2. Large stool burden noted within  the colon compatible with constipation. 3. Gallstone with mild gallbladder distension and mild intrahepatic and extrahepatic bile duct dilatation. Correlate for any clinical signs or symptoms of biliary obstruction. 4. Diffuse increased density throughout the liver may be related to drug therapy such as amiodarone versus hemochromatosis. 5. Small volume of free fluid identified within the dependent portion of the pelvis. 6.  Aortic Atherosclerosis (ICD10-I70.0). Electronically Signed   By: Kerby Moors M.D.   On: 03/19/2022 15:34   DG Abdomen 1 View  Result Date: 03/19/2022 CLINICAL DATA:  Obstruction EXAM: ABDOMEN - 1 VIEW COMPARISON:  03/17/2022 FINDINGS: No significant change in bowel gas pattern with diffusely distended small bowel and colon throughout the abdomen and pelvis, largest loops of small bowel measuring 5.0 cm,: Measuring up to 5.7 cm. Large burden of stool throughout the colon with gas present to the rectum. No free air in the abdomen. IMPRESSION: No significant change in bowel gas pattern with diffusely distended small bowel and colon throughout the abdomen and pelvis. Large burden of stool throughout the colon with gas present to the rectum. Electronically Signed   By: Delanna Ahmadi M.D.   On: 03/19/2022 13:54   CT ABDOMEN PELVIS W CONTRAST  Result Date: 03/17/2022 CLINICAL DATA:  Abdominal pain, acute, nonlocalized constipation. Constipation EXAM: CT ABDOMEN AND PELVIS WITH CONTRAST TECHNIQUE: Multidetector CT imaging of the abdomen and pelvis was performed using the standard protocol following bolus administration of intravenous contrast. RADIATION DOSE REDUCTION: This exam was performed according to the departmental dose-optimization program which includes automated exposure control, adjustment of the mA and/or kV according to patient size and/or use of iterative reconstruction technique. CONTRAST:  58m OMNIPAQUE IOHEXOL 300 MG/ML  SOLN COMPARISON:  02/19/2022 FINDINGS: Lower chest:  Cardiac size within normal limits. Pacer leads are noted within the right ventricle. Left ventricular apical aneurysm again noted. No pericardial effusion. Small hiatal hernia. Visualized lung bases are clear. Hepatobiliary: Cholelithiasis without pericholecystic inflammatory change. Hepatic parenchymal hyperdensity again noted diffusely, possibly related to chronic drug therapy, such as amiodarone, or depositional disease. No focal intrahepatic mass. No intra or extrahepatic biliary ductal dilation. Pancreas: Unremarkable. No pancreatic ductal dilatation or surrounding inflammatory changes. Spleen: Unremarkable Adrenals/Urinary Tract: Adrenal glands are unremarkable. Kidneys are normal, without renal calculi, focal lesion, or hydronephrosis. Bladder is unremarkable. Stomach/Bowel: There is progressive large volume stool within the ascending and transverse colon. The distal colon is relatively decompressed. No obstructing lesion is identified. The stomach, small bowel, and large bowel are otherwise unremarkable. The appendix is absent. No free intraperitoneal gas or fluid. Vascular/Lymphatic: Aortic atherosclerosis. No enlarged abdominal or pelvic lymph nodes. Reproductive: Status post hysterectomy. No adnexal masses. Other: No abdominal wall hernia. Musculoskeletal: No acute bone abnormality. No lytic or blastic bone lesion. Osseous structures are age-appropriate. IMPRESSION: 1. Progressive large volume stool within the ascending and transverse colon. No obstructing lesion is identified. 2. Cholelithiasis. 3. Left ventricular apical aneurysm again noted. 4. Hepatic parenchymal hyperdensity, possibly related to chronic drug therapy, such as amiodarone, or depositional disease. 5. Aortic atherosclerosis. Aortic Atherosclerosis (ICD10-I70.0). Electronically Signed   By: ALinwood DibblesD.  On: 03/17/2022 20:30   DG Abd 2 Views  Result Date: 03/17/2022 CLINICAL DATA:  Constipation EXAM: ABDOMEN - 2 VIEW  COMPARISON:  02/19/2022 FINDINGS: Moderate-large volume stool throughout the colon. There is gaseous distension of the colon. No abnormally dilated loops of small bowel are identified. No gross free intraperitoneal air. Cholelithiasis. No acute bony findings. IMPRESSION: 1. Gaseous distension of the colon, which may represent ileus. 2. Moderate-large volume stool throughout the colon. 3. Cholelithiasis. Electronically Signed   By: Davina Poke D.O.   On: 03/17/2022 16:43   CT ABDOMEN PELVIS W CONTRAST  Result Date: 02/19/2022 CLINICAL DATA:  Bowel obstruction suspected EXAM: CT ABDOMEN AND PELVIS WITH CONTRAST TECHNIQUE: Multidetector CT imaging of the abdomen and pelvis was performed using the standard protocol following bolus administration of intravenous contrast. RADIATION DOSE REDUCTION: This exam was performed according to the departmental dose-optimization program which includes automated exposure control, adjustment of the mA and/or kV according to patient size and/or use of iterative reconstruction technique. CONTRAST:  3m OMNIPAQUE IOHEXOL 350 MG/ML SOLN COMPARISON:  None Available. FINDINGS: Lower chest: No pleural or pericardial effusion. Pacing leads extend towards the RV apex. Visualized lung bases clear, with some motion degradation. Hepatobiliary: Gallbladder is physiologically distended, containing at least 1 2 cm gallstone. No definite wall thickening or regional inflammatory change. No focal liver lesion or intrahepatic biliary ductal dilatation. Pancreas: Unremarkable. No pancreatic ductal dilatation or surrounding inflammatory changes. Spleen: Normal in size without focal abnormality. Adrenals/Urinary Tract: Adrenal glands are unremarkable. Kidneys are normal, without renal calculi, focal lesion, or hydronephrosis. Bladder is unremarkable. Stomach/Bowel: Small hiatal hernia. The stomach is nondistended, otherwise unremarkable. Small bowel decompressed. Appendix not discretely  identified. The colon is partially distended by gas and fecal material, without wall thickening or regional inflammatory change. Vascular/Lymphatic: Moderate calcified aortoiliac atheromatous plaque. No abdominal or pelvic adenopathy. Portal vein patent. Reproductive: Status post hysterectomy. No adnexal masses. Other: Bilateral pelvic phleboliths.  No ascites.  No free air. Musculoskeletal: Mild lumbar levoscoliosis with multilevel spondylitic change. Sclerotic intramedullary focus in the proximal left femoral shaft suggesting remote bone infarct. No acute findings. IMPRESSION: 1. No acute findings. 2. Cholelithiasis. 3. Small hiatal hernia. 4.  Aortic Atherosclerosis (ICD10-I70.0). Electronically Signed   By: DLucrezia EuropeM.D.   On: 02/19/2022 19:43   (Echo, Carotid, EGD, Colonoscopy, ERCP)    Subjective: No complaints  Discharge Exam: Vitals:   03/20/22 0430 03/20/22 0856  BP: (!) 151/59 (!) 155/61  Pulse: 73 73  Resp:  18  Temp: 98.1 F (36.7 C) 98.2 F (36.8 C)  SpO2: 98% 95%   Vitals:   03/19/22 2214 03/20/22 0033 03/20/22 0430 03/20/22 0856  BP: (!) 147/55 (!) 151/61 (!) 151/59 (!) 155/61  Pulse: 69 73 73 73  Resp: 20   18  Temp: 98.3 F (36.8 C) 97.9 F (36.6 C) 98.1 F (36.7 C) 98.2 F (36.8 C)  TempSrc: Oral Oral Oral Oral  SpO2: 96% 98% 98% 95%  Weight: 61.4 kg       General: Pt is alert, awake, not in acute distress Cardiovascular: RRR, S1/S2 +, no rubs, no gallops Respiratory: CTA bilaterally, no wheezing, no rhonchi Abdominal: Soft, NT, ND, bowel sounds + Extremities: no edema, no cyanosis    The results of significant diagnostics from this hospitalization (including imaging, microbiology, ancillary and laboratory) are listed below for reference.     Microbiology: No results found for this or any previous visit (from the past 240 hour(s)).   Labs: BNP (  last 3 results) No results for input(s): "BNP" in the last 8760 hours. Basic Metabolic Panel: Recent  Labs  Lab 03/17/22 1600 03/19/22 1319 03/20/22 0418  NA 128* 125* 127*  K 4.5 4.3 3.9  CL 93* 92* 92*  CO2 '26 24 24  '$ GLUCOSE 97 116* 105*  BUN '13 16 15  '$ CREATININE 0.97 1.02* 1.00  CALCIUM 9.4 8.9 8.5*   Liver Function Tests: Recent Labs  Lab 03/17/22 1600 03/19/22 1319 03/20/22 0418  AST 57* 58* 58*  ALT 50* 51* 55*  ALKPHOS 69 75 82  BILITOT 0.8 0.7 0.9  PROT 7.8 7.1 7.4  ALBUMIN 4.2 3.4* 3.3*   Recent Labs  Lab 03/19/22 1319  LIPASE 29   No results for input(s): "AMMONIA" in the last 168 hours. CBC: Recent Labs  Lab 03/17/22 1600 03/19/22 1319 03/20/22 0418  WBC 7.9 7.9 11.5*  NEUTROABS 5.8  --  10.2*  HGB 10.6* 10.9* 11.3*  HCT 32.3* 33.1* 34.7*  MCV 78.8* 78.6* 78.9*  PLT 314 344 328   Cardiac Enzymes: No results for input(s): "CKTOTAL", "CKMB", "CKMBINDEX", "TROPONINI" in the last 168 hours. BNP: Invalid input(s): "POCBNP" CBG: No results for input(s): "GLUCAP" in the last 168 hours. D-Dimer No results for input(s): "DDIMER" in the last 72 hours. Hgb A1c No results for input(s): "HGBA1C" in the last 72 hours. Lipid Profile No results for input(s): "CHOL", "HDL", "LDLCALC", "TRIG", "CHOLHDL", "LDLDIRECT" in the last 72 hours. Thyroid function studies Recent Labs    03/19/22 2138  TSH 3.074   Anemia work up No results for input(s): "VITAMINB12", "FOLATE", "FERRITIN", "TIBC", "IRON", "RETICCTPCT" in the last 72 hours. Urinalysis    Component Value Date/Time   COLORURINE YELLOW 03/19/2022 1302   APPEARANCEUR CLEAR 03/19/2022 1302   LABSPEC 1.010 03/19/2022 1302   PHURINE 6.5 03/19/2022 1302   GLUCOSEU NEGATIVE 03/19/2022 1302   HGBUR TRACE (A) 03/19/2022 1302   BILIRUBINUR NEGATIVE 03/19/2022 1302   KETONESUR NEGATIVE 03/19/2022 1302   PROTEINUR NEGATIVE 03/19/2022 1302   NITRITE NEGATIVE 03/19/2022 1302   LEUKOCYTESUR NEGATIVE 03/19/2022 1302   Sepsis Labs Recent Labs  Lab 03/17/22 1600 03/19/22 1319 03/20/22 0418  WBC 7.9 7.9  11.5*   Microbiology No results found for this or any previous visit (from the past 240 hour(s)).    SIGNED:   Charlynne Cousins, MD  [facility] 03/20/2022, 10:10 AM Pager   If 7PM-7AM, please contact night-coverage [email or web address] Password [email or web address]

## 2022-03-20 NOTE — Consult Note (Addendum)
[facility] Gastroenterology Consult  Referring Provider: ER Primary Care Physician:  Kathalene Frames, MD Primary Gastroenterologist: Dr. Earle Gell previously, [facility] primary  Reason for Consultation: Constipation  HPI: Paula [name] is a 87 y.o. female was admitted with constipation and concern for small bowel obstruction.  Patient states that she has always had history of constipation for which she intermittently takes MiraLAX.  However constipation has worsened in the last several weeks and she has required ER visits, 02/19/2022, discharged on GoLytely without much improvement in constipation, ER visit on 03/17/2022, was given enema and discharged on MiraLAX. Patient received SMOG enema and MiraLAX and reports she has had several bowel movements, at least by overnight with relief of abdominal distention and bloating. Patient denies noticing blood in stool. As an outpatient she has tried MiraLAX, senna, stool softeners intermittently, has not been on a laxative on a regular basis. She denies unintentional weight loss or loss of appetite. She has intermittent heartburn and acid reflux for which she takes Pepcid AC at night and occasionally in the morning. She denies difficulty swallowing or pain on swallowing. Her last colonoscopy was in 2015 with Dr. Wynetta Emery, she had 3 to 8 mm sessile serrated adenomas removed from ascending and descending colon.   Past Medical History:  Diagnosis Date   CHF (congestive heart failure) (HCC)    Hypothyroidism    Irregular heart rate    Thyroid disease     Past Surgical History:  Procedure Laterality Date   PACEMAKER IMPLANT N/A 07/17/2020   Procedure: PACEMAKER IMPLANT;  Surgeon: Evans Lance, MD;  Location: [facility];  Service: Cardiovascular;  Laterality: N/A;    Prior to Admission medications   Medication Sig Start Date End Date Taking? Authorizing Provider  acetaminophen (TYLENOL) 325 MG tablet Take 2 tablets (650 mg  total) by mouth every 4 (four) hours as needed for headache or mild pain. 07/18/20   Shirley Friar, PA-C  amiodarone (PACERONE) 200 MG tablet TAKE 1 TABLET EVERY DAY EXCEPT SUNDAY 03/06/22   Donato Heinz, MD  calcium carbonate (OS-CAL - DOSED IN MG OF ELEMENTAL CALCIUM) 1250 (500 Ca) MG tablet Take 1 tablet by mouth daily with breakfast.    [provider]  diltiazem (CARDIZEM CD) 300 MG 24 hr capsule TAKE 1 CAPSULE EVERY DAY 07/04/21   Donato Heinz, MD  ELIQUIS 5 MG TABS tablet Take 1 tablet (5 mg total) by mouth in the morning and at bedtime. 07/23/20   Shirley Friar, PA-C  famotidine (PEPCID) 10 MG tablet Take 10 mg by mouth at bedtime.    [provider]  fluticasone (FLONASE) 50 MCG/ACT nasal spray Place 1 spray into both nostrils at bedtime. 10/17/21   Chesley Mires, MD  levothyroxine (SYNTHROID, LEVOTHROID) 88 MCG tablet Take 88 mcg by mouth daily before breakfast.    [provider]  Multiple Vitamin (MULTIVITAMIN WITH MINERALS) TABS tablet Take 1 tablet by mouth daily.    [provider]  polyethylene glycol powder (MIRALAX) 17 GM/SCOOP powder Take by mouth as needed.    [provider]  prazosin (MINIPRESS) 1 MG capsule TAKE 1 CAPSULE AT BEDTIME 08/15/21   Donato Heinz, MD  rosuvastatin (CRESTOR) 5 MG tablet Take 1 tablet (5 mg total) by mouth daily. 10/29/21   Donato Heinz, MD  sodium chloride (OCEAN) 0.65 % SOLN nasal spray Place 1 spray into both nostrils as needed for congestion.    [provider]  valsartan (  DIOVAN) 160 MG tablet Take 1 tablet (160 mg total) by mouth daily. 02/18/22   Donato Heinz, MD    Current Facility-Administered Medications  Medication Dose Route Frequency Provider Last Rate Last Admin   0.9 %  sodium chloride infusion   Intravenous Continuous Charlynne Cousins, MD       acetaminophen (TYLENOL) tablet 650 mg  650 mg Oral Q6H PRN Quintella Baton, MD       Or   acetaminophen (TYLENOL) suppository 650 mg  650 mg Rectal Q6H PRN Quintella Baton, MD       amiodarone (PACERONE) tablet 200 mg  200 mg Oral Daily Claria Dice, Debby, MD   200 mg at 03/19/22 2131   apixaban (ELIQUIS) tablet 5 mg  5 mg Oral BID Quintella Baton, MD   5 mg at 03/19/22 2131   diltiazem (CARDIZEM CD) 24 hr capsule 300 mg  300 mg Oral QPM Crosley, Debby, MD   300 mg at 03/19/22 2328   famotidine (PEPCID) tablet 10 mg  10 mg Oral QHS Crosley, Debby, MD       irbesartan (AVAPRO) tablet 75 mg  75 mg Oral Daily Crosley, Debby, MD       levothyroxine (SYNTHROID) tablet 88 mcg  88 mcg Oral QAC breakfast Crosley, Debby, MD       ondansetron (ZOFRAN) tablet 4 mg  4 mg Oral Q6H PRN Crosley, Debby, MD       Or   ondansetron (ZOFRAN) injection 4 mg  4 mg Intravenous Q6H PRN Crosley, Debby, MD       polyethylene glycol (MIRALAX / GLYCOLAX) packet 17 g  17 g Oral BID Charlynne Cousins, MD       prazosin (MINIPRESS) capsule 1 mg  1 mg Oral QHS Crosley, Debby, MD       rosuvastatin (CRESTOR) tablet 5 mg  5 mg Oral Daily Crosley, Debby, MD       sorbitol, milk of mag, mineral oil, glycerin (SMOG) enema  400 mL Rectal Once Quintella Baton, MD        Allergies as of 03/19/2022 - Review Complete 03/19/2022  Allergen Reaction Noted   Wound dressing adhesive  12/31/2019    Family History  Problem Relation Age of Onset   Breast cancer Sister 51    Social History   Socioeconomic History   Marital status: Married    Spouse name: Not on file   Number of children: Not on file   Years of education: Not on file   Highest education level: Not on file  Occupational History   Not on file  Tobacco Use   Smoking status: Never   Smokeless tobacco: Never  Vaping Use   Vaping Use: Never used  Substance and Sexual Activity   Alcohol use: Yes    Alcohol/week: 1.0 standard drink of alcohol    Types: 1 Glasses of wine per week   Drug use: No   Sexual activity: Not Currently     Birth control/protection: Post-menopausal  Other Topics Concern   Not on file  Social History Narrative   Not on file   Social Determinants of Health   Financial Resource Strain: Not on file  Food Insecurity: Not on file  Transportation Needs: Not on file  Physical Activity: Not on file  Stress: Not on file  Social Connections: Not on file  Intimate Partner Violence: Not on file    Review of Systems: As per HPI  Physical Exam: Vital signs in last  24 hours: Temp:  [97.9 F (36.6 C)-98.3 F (36.8 C)] 98.1 F (36.7 C) (02/28 0430) Pulse Rate:  [61-73] 73 (02/28 0430) Resp:  [14-20] 20 (02/27 2214) BP: (133-163)/(55-83) 151/59 (02/28 0430) SpO2:  [96 %-100 %] 98 % (02/28 0430) Weight:  [61.4 kg] 61.4 kg (02/27 2214) Last BM Date : 03/12/22  General:   Alert,  Well-developed, well-nourished, pleasant and cooperative in NAD Head:  Normocephalic and atraumatic. Eyes:  Sclera clear, no icterus.   Conjunctiva pink. Ears:  Normal auditory acuity. Nose:  No deformity, discharge,  or lesions. Mouth:  No deformity or lesions.  Oropharynx pink & moist. Neck:  Supple; no masses or thyromegaly. Lungs:  Clear throughout to auscultation.   No wheezes, crackles, or rhonchi. No acute distress. Heart:  Regular rate and rhythm; no murmurs, clicks, rubs,  or gallops. Extremities:  Without clubbing or edema. Neurologic:  Alert and  oriented x4;  grossly normal neurologically. Skin:  Intact without significant lesions or rashes. Psych:  Alert and cooperative. Normal mood and affect. Abdomen:  Soft, nontender and nondistended. No masses, hepatosplenomegaly or hernias noted. Normal bowel sounds, without guarding, and without rebound.         Lab Results: Recent Labs    03/17/22 1600 03/19/22 1319 03/20/22 0418  WBC 7.9 7.9 11.5*  HGB 10.6* 10.9* 11.3*  HCT 32.3* 33.1* 34.7*  PLT 314 344 328   BMET Recent Labs    03/17/22 1600 03/19/22 1319 03/20/22 0418  NA 128* 125* 127*  K  4.5 4.3 3.9  CL 93* 92* 92*  CO2 '26 24 24  '$ GLUCOSE 97 116* 105*  BUN '13 16 15  '$ CREATININE 0.97 1.02* 1.00  CALCIUM 9.4 8.9 8.5*   LFT Recent Labs    03/20/22 0418  PROT 7.4  ALBUMIN 3.3*  AST 58*  ALT 55*  ALKPHOS 82  BILITOT 0.9   PT/INR No results for input(s): "LABPROT", "INR" in the last 72 hours.  Studies/Results: CT ABDOMEN PELVIS W CONTRAST  Result Date: 03/19/2022 CLINICAL DATA:  Abdominal pain.  Constipation. EXAM: CT ABDOMEN AND PELVIS WITH CONTRAST TECHNIQUE: Multidetector CT imaging of the abdomen and pelvis was performed using the standard protocol following bolus administration of intravenous contrast. RADIATION DOSE REDUCTION: This exam was performed according to the departmental dose-optimization program which includes automated exposure control, adjustment of the mA and/or kV according to patient size and/or use of iterative reconstruction technique. CONTRAST:  1m OMNIPAQUE IOHEXOL 350 MG/ML SOLN COMPARISON:  03/17/22 FINDINGS: Lower chest: No acute abnormality. Scarring noted within both lung bases. No pleural fluid or airspace disease. Hepatobiliary: Again noted is diffuse increased density throughout the liver which may be related to drug therapy such as amiodarone versus hemochromatosis. No focal liver lesion identified. Gallbladder is mildly distended. Stone within the gallbladder measures 2.5 cm. No pericholecystic inflammation. Increase caliber of the common bile duct measures 8 mm. Mild intrahepatic bile duct dilatation is similar to the previous exam. Pancreas: Unremarkable. No pancreatic ductal dilatation or surrounding inflammatory changes. Spleen: Normal in size without focal abnormality. Adrenals/Urinary Tract: Normal adrenal glands. No signs of nephrolithiasis, hydronephrosis or mass. Bladder is unremarkable. Stomach/Bowel: Small hiatal hernia. Stomach appears decompressed. The duodenum and proximal jejunal bowel loops are normal in caliber. Mid small bowel  loops are abnormally dilated and contain multiple air-fluid levels which measure up to 3.7 cm. Transition to decreased caliber distal small bowel is noted within the right lower quadrant at the level of the terminal ileum just proximal  to the ileocecal valve. As before there is a large stool burden noted within the colon. No colonic wall thickening or inflammation. Vascular/Lymphatic: Aortic atherosclerosis. No signs of abdominopelvic adenopathy. Reproductive: Status post hysterectomy. No adnexal masses. Other: Small volume of free fluid identified within the dependent portion of the pelvis. No pneumoperitoneum. No focal fluid collections. Musculoskeletal: No acute or suspicious osseous findings. Unchanged appearance of mild superior endplate deformity involving the L1 vertebra. IMPRESSION: 1. Examination is positive for small bowel obstruction with transition to decreased caliber distal small bowel at the level of the terminal ileum just proximal to the ileocecal valve. 2. Large stool burden noted within the colon compatible with constipation. 3. Gallstone with mild gallbladder distension and mild intrahepatic and extrahepatic bile duct dilatation. Correlate for any clinical signs or symptoms of biliary obstruction. 4. Diffuse increased density throughout the liver may be related to drug therapy such as amiodarone versus hemochromatosis. 5. Small volume of free fluid identified within the dependent portion of the pelvis. 6.  Aortic Atherosclerosis (ICD10-I70.0). Electronically Signed   By: Kerby Moors M.D.   On: 03/19/2022 15:34   DG Abdomen 1 View  Result Date: 03/19/2022 CLINICAL DATA:  Obstruction EXAM: ABDOMEN - 1 VIEW COMPARISON:  03/17/2022 FINDINGS: No significant change in bowel gas pattern with diffusely distended small bowel and colon throughout the abdomen and pelvis, largest loops of small bowel measuring 5.0 cm,: Measuring up to 5.7 cm. Large burden of stool throughout the colon with gas present  to the rectum. No free air in the abdomen. IMPRESSION: No significant change in bowel gas pattern with diffusely distended small bowel and colon throughout the abdomen and pelvis. Large burden of stool throughout the colon with gas present to the rectum. Electronically Signed   By: Delanna Ahmadi M.D.   On: 03/19/2022 13:54    Impression: Constipation, possible small bowel obstruction on CAT scan, large stool burden noted on CT  Gallstones with mild gallbladder distention and mild intra and extrahepatic biliary ductal dilatation, no signs of obstruction, T. bili/AST/ALT/ALP of 0.9/58/55/82  Hyponatremia, sodium 127, hyperchloridemia, 92 Mild microcytic anemia, hemoglobin 11.3, MCV 78.9  Multiple comorbidities: Paroxysmal atrial fibrillation on Eliquis and Cardizem, dyslipidemia, hypertension, history of pacemaker    Plan: Abdominal x-ray has been performed in a.m., clinically constipation has resolved. Will start patient on clear liquid. Will start patient on Linzess 290 mcg p.o. daily, which needs to be continued as an outpatient as well. Okay to continue MiraLAX twice daily. If abdominal x-ray is reassuring and the patient is able to tolerate clear liquid, advance to regular diet in the afternoon and possible discharge home, to follow-up with [facility] as an outpatient.   LOS: 0 days   Ronnette Juniper, MD  03/20/2022, 8:25 AM

## 2022-03-21 ENCOUNTER — Ambulatory Visit: Payer: Medicare HMO

## 2022-03-22 ENCOUNTER — Telehealth (HOSPITAL_COMMUNITY): Payer: Self-pay | Admitting: *Deleted

## 2022-03-22 LAB — CEA: CEA: 4.8 ng/mL — ABNORMAL HIGH (ref 0.0–4.7)

## 2022-03-22 NOTE — Telephone Encounter (Signed)
Reaching out to patient to offer assistance regarding upcoming cardiac imaging study; pt verbalizes understanding of appt date/time, parking situation and where to check in, and verified current allergies; name and call back number provided for further questions should they arise  Gordy Clement RN Navigator Cardiac Imaging [facility] [phone] office [phone] cell  Patient has had the MRI in the past without incident. Patient has a PPM that is MRI compatible and is aware to arrive at 12pm.

## 2022-03-24 ENCOUNTER — Other Ambulatory Visit: Payer: Self-pay | Admitting: Cardiology

## 2022-03-24 DIAGNOSIS — E785 Hyperlipidemia, unspecified: Secondary | ICD-10-CM

## 2022-03-25 ENCOUNTER — Ambulatory Visit (HOSPITAL_COMMUNITY)
Admission: RE | Admit: 2022-03-25 | Discharge: 2022-03-25 | Disposition: A | Discharge status: Home / Self Care | Payer: Medicare HMO | Source: Ambulatory Visit | Attending: Cardiology | Admitting: Cardiology

## 2022-03-25 DIAGNOSIS — I071 Rheumatic tricuspid insufficiency: Secondary | ICD-10-CM | POA: Diagnosis not present

## 2022-03-25 DIAGNOSIS — I340 Nonrheumatic mitral (valve) insufficiency: Secondary | ICD-10-CM | POA: Insufficient documentation

## 2022-03-25 MED ORDER — GADOBUTROL 1 MMOL/ML IV SOLN
6.0000 mL | Freq: Once | INTRAVENOUS | Status: AC | PRN
  Administered 2022-03-25: 6 mL via INTRAVENOUS

## 2022-03-25 NOTE — Progress Notes (Signed)
Per order, Changed device settings for MRI to  ?DOO at 80 bpm  ?Will program device back to pre-MRI settings after completion of exam, and send transmission ?

## 2022-03-29 ENCOUNTER — Other Ambulatory Visit: Payer: Self-pay | Admitting: Cardiology

## 2022-03-29 DIAGNOSIS — I071 Rheumatic tricuspid insufficiency: Secondary | ICD-10-CM

## 2022-03-29 DIAGNOSIS — I340 Nonrheumatic mitral (valve) insufficiency: Secondary | ICD-10-CM

## 2022-04-01 ENCOUNTER — Other Ambulatory Visit: Payer: Self-pay | Admitting: Internal Medicine

## 2022-04-01 ENCOUNTER — Ambulatory Visit
Admission: RE | Admit: 2022-04-01 | Discharge: 2022-04-01 | Disposition: A | Discharge status: Home / Self Care | Payer: Medicare HMO | Source: Ambulatory Visit | Attending: Internal Medicine | Admitting: Internal Medicine

## 2022-04-01 DIAGNOSIS — M545 Low back pain, unspecified: Secondary | ICD-10-CM

## 2022-04-02 ENCOUNTER — Ambulatory Visit (HOSPITAL_COMMUNITY)
Admission: RE | Admit: 2022-04-02 | Discharge: 2022-04-02 | Disposition: A | Discharge status: Home / Self Care | Payer: Medicare HMO | Source: Ambulatory Visit | Attending: Cardiology | Admitting: Cardiology

## 2022-04-02 ENCOUNTER — Other Ambulatory Visit: Payer: Self-pay | Admitting: Cardiology

## 2022-04-02 DIAGNOSIS — I340 Nonrheumatic mitral (valve) insufficiency: Secondary | ICD-10-CM

## 2022-04-02 DIAGNOSIS — I071 Rheumatic tricuspid insufficiency: Secondary | ICD-10-CM

## 2022-04-02 NOTE — Progress Notes (Addendum)
Informed of MRI for today.   Device system confirmed to be MRI conditional, with implant date > 6 weeks ago, and no evidence of abandoned or epicardial leads in review of most recent CXR Interrogation from today reviewed, pt is currently AP-VS at ~60-65 bpm  Change device settings for MRI to DOO at 85 bpm   Tachy-therapies to off if applicable.  Program device back to pre-MRI settings after completion of exam.  Annamaria Helling  04/02/2022 1:27 PM

## 2022-04-02 NOTE — Progress Notes (Signed)
Per order, Changed device settings for MRI to DOO 85 bpm  Tachy-therapies to off if applicable.  Program device back to pre-MRI settings after completion of exam.

## 2022-04-03 ENCOUNTER — Encounter: Payer: Self-pay | Admitting: Cardiology

## 2022-04-04 NOTE — Progress Notes (Signed)
Cardiology Clinic Note   Patient Name: Paula [name] Date of Encounter: 04/05/2022  Primary Care Provider:  Kathalene Frames, MD Primary Cardiologist:  Paula Heinz, MD  Patient Profile    87 y.o. female with a hx of paroxysmal atrial fibrillation, on Eliquis given CHA2DS2-VASc score 5. 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 Recent hospitalization from 03/19/2022 -03/21/2022 for severe constipation and hypovolemic hyponatremia.   Past Medical History    Past Medical History:  Diagnosis Date   CHF (congestive heart failure) (HCC)    Hypothyroidism    Irregular heart rate    Thyroid disease    Past Surgical History:  Procedure Laterality Date   PACEMAKER IMPLANT N/A 07/17/2020   Procedure: PACEMAKER IMPLANT;  Surgeon: Paula Lance, MD;  Location: [facility];  Service: Cardiovascular;  Laterality: N/A;    Allergies  Allergies  Allergen Reactions   Wound Dressing Adhesive Rash    History of Present Illness    Paula [name] is an 87 year old we are seeing on follow up for ongoing assessment and management of hypertrophic cardiomyopathy, permanent atrial fibrillation, hypertension. She is followed by EP for PPM in the setting of tachybrady syndrome. She informed our office that she is retaining fluid after being admitted to the hospital for constipation. She reports a 4 lb weight gain. Needs to be put on Warfarin per Paula [name] and scheduled with the coumadin clinic.   Her first concern is lower extremity edema, ankles and pretibial area since starting on gabapentin, as well as usual medication regimen of diltiazem.  She wishes to stop taking the gabapentin.  Home Medications    Current Outpatient Medications  Medication Sig Dispense Refill   acetaminophen (TYLENOL) 325 MG tablet every 4 (four) hours as needed for moderate pain or headache.     amiodarone (PACERONE)  200 MG tablet TAKE 1 TABLET EVERY DAY EXCEPT SUNDAY 90 tablet 3   Calcium Carb-Cholecalciferol 600-5 MG-MCG TABS daily at 6 (six) AM.     diltiazem (CARDIZEM CD) 300 MG 24 hr capsule TAKE 1 CAPSULE EVERY DAY 90 capsule 3   ELIQUIS 5 MG TABS tablet Take 1 tablet (5 mg total) by mouth in the morning and at bedtime. 60 tablet    famotidine (PEPCID AC) 10 MG tablet as needed for heartburn or indigestion.     hydrochlorothiazide (MICROZIDE) 12.5 MG capsule Take 1 capsule (12.5 mg total) by mouth as needed (For swelling and edema). 30 capsule 3   levothyroxine (SYNTHROID, LEVOTHROID) 88 MCG tablet Take 88 mcg by mouth daily before breakfast.     linaclotide (LINZESS) 290 MCG CAPS capsule Take 1 capsule (290 mcg total) by mouth daily before breakfast. 30 capsule 0   Multiple Vitamin (MULTIVITAMIN WITH MINERALS) TABS tablet Take 1 tablet by mouth daily.     polyethylene glycol powder (GLYCOLAX/MIRALAX) 17 GM/SCOOP powder Take 17 g by mouth 2 (two) times daily. 238 g 0   prazosin (MINIPRESS) 1 MG capsule TAKE 1 CAPSULE AT BEDTIME 90 capsule 3   rosuvastatin (CRESTOR) 5 MG tablet TAKE 1 TABLET EVERY DAY 90 tablet 2   sodium chloride (OCEAN) 0.65 % SOLN nasal spray Place 1 spray into both nostrils as needed for congestion.     valsartan (DIOVAN) 160 MG tablet Take 1 tablet (160 mg total) by mouth daily. 90 tablet 3   warfarin (COUMADIN) 5 MG tablet Take 1 tablet (5 mg total) by mouth daily. [place]  tablet 0   No current facility-administered medications for this visit.     Family History    Family History  Problem Relation Age of Onset   Breast cancer Sister 43   She indicated that her sister is deceased.  Social History    Social History   Socioeconomic History   Marital status: Married    Spouse name: Not on file   Number of children: Not on file   Years of education: Not on file   Highest education level: Not on file  Occupational History   Not on file  Tobacco Use   Smoking status: Never    Smokeless tobacco: Never  Vaping Use   Vaping Use: Never used  Substance and Sexual Activity   Alcohol use: Yes    Alcohol/week: 1.0 standard drink of alcohol    Types: 1 Glasses of wine per week   Drug use: No   Sexual activity: Not Currently    Birth control/protection: Post-menopausal  Other Topics Concern   Not on file  Social History Narrative   Not on file   Social Determinants of Health   Financial Resource Strain: Not on file  Food Insecurity: Not on file  Transportation Needs: Not on file  Physical Activity: Not on file  Stress: Not on file  Social Connections: Not on file  Intimate Partner Violence: Not on file     Review of Systems    General:  No chills, fever, night sweats or weight changes.  Cardiovascular:  No chest pain, dyspnea on exertion, edema, orthopnea, palpitations, paroxysmal nocturnal dyspnea. Dermatological: No rash, lesions/masses Respiratory: No cough, dyspnea Urologic: No hematuria, dysuria Abdominal:   No nausea, vomiting, diarrhea, bright red blood per rectum, melena, or hematemesis Neurologic:  No visual changes, wkns, changes in mental status. All other systems reviewed and are otherwise negative except as noted above.     Physical Exam    VS:  BP 138/60   Pulse 60   Ht 5\' 4"  (1.626 m)   Wt 133 lb (60.3 kg)   SpO2 98%   BMI 22.83 kg/m  , BMI Body mass index is 22.83 kg/m.     GEN: Well nourished, well developed, in no acute distress. HEENT: normal. Neck: Supple, no JVD, carotid bruits, or masses. Cardiac: IRRR, no murmurs, rubs, or gallops. No clubbing, cyanosis, 2+ ankle and foot so much for everything edema.  Radials/DP/PT 2+ and equal bilaterally.  Respiratory:  Respirations regular and unlabored, clear to auscultation bilaterally. GI: Soft, nontender, nondistended, BS + x 4. MS: no deformity or atrophy. Skin: warm and dry, no rash. Neuro:  Strength and sensation are intact. Psych: Normal affect.  Accessory Clinical  Findings    ECG personally reviewed by me today-not completed this office visit.  Lab Results  Component Value Date   WBC 11.5 (H) 03/20/2022   HGB 11.3 (L) 03/20/2022   HCT 34.7 (L) 03/20/2022   MCV 78.9 (L) 03/20/2022   PLT 328 03/20/2022   Lab Results  Component Value Date   CREATININE 1.00 03/20/2022   BUN 15 03/20/2022   NA 127 (L) 03/20/2022   K 3.9 03/20/2022   CL 92 (L) 03/20/2022   CO2 24 03/20/2022   Lab Results  Component Value Date   ALT 55 (H) 03/20/2022   AST 58 (H) 03/20/2022   ALKPHOS 82 03/20/2022   BILITOT 0.9 03/20/2022   Lab Results  Component Value Date   CHOL 174 03/20/2021   HDL 93 03/20/2021  LDLCALC 66 03/20/2021   TRIG 79 03/20/2021   CHOLHDL 1.9 03/20/2021    No results found for: "HGBA1C"  Review of Prior Studies: Cardiac MRI Narrative & Impression  CLINICAL DATA:  60F with apical HCM, PAF, tachybrady syndrome s/p PPM. Echo 02/11/22 showed moderate-severe MR, moderate-severe TR   EXAM: CARDIAC MRI 03/25/2022   TECHNIQUE: The patient was scanned on a 1.5 Tesla Siemens magnet. A dedicated cardiac coil was used. Functional imaging was done using Fiesta sequences. 2,3, and 4 chamber views were done to assess for RWMA's. Modified Simpson's rule using a short axis stack was used to calculate an ejection fraction on a dedicated work Conservation officer, nature. The patient received 6 cc of Gadavist. After 10 minutes inversion recovery sequences were used to assess for infiltration and scar tissue. 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   -Normal size   -Normal systolic function   -Patchy LGE at apex accounting for <1% total myocardial mass   -Small apical thrombus   LV EF:  60% (Normal 52-79%)   Absolute volumes:   LV EDV:  169mL (Normal 78-167 mL)   LV ESV: 42mL (Normal 21-64 mL)   LV SV: 52mL (Normal 52-114 mL)   CO: 6.6L/min (Normal 2.7-6.3 L/min)   Indexed volumes:   LV EDV: 52mL/sq-m (Normal 50-96 mL/sq-m)   LV ESV: 5mL/sq-m (Normal 10-40 mL/sq-m)   LV SV: 32mL/sq-m (Normal 33-64 mL/sq-m)   CI: 4.0L/min/sq-m (Normal 1.9-3.9 L/min/sq-m)   Right ventricle: Normal size and systolic function   RV EF: 53% (Normal 52-80%)   Absolute volumes:   RV EDV: 149mL (Normal 79-175 mL)   RV ESV: 51mL (Normal 13-75 mL)   RV SV: 29mL (Normal 56-110 mL)   CO: 6.2L/min (Normal 2.7-6 L/min)   Indexed volumes:   RV EDV: 67mL/sq-m (Normal 51-97 mL/sq-m)   RV ESV: 40mL/sq-m (Normal 9-42 mL/sq-m)   RV SV: 46mL/sq-m (Normal 35-61 mL/sq-m)   CI: 3.7L/min/sq-m (Normal 1.8-3.8 L/min/sq-m)   Left atrium: Moderate enlargement   Right atrium: Mild enlargement   Mitral valve: Moderate to severe regurgitation; moderate by regurgitant volume (32cc), moderate to severe by regurgitant fraction (39%)   Aortic valve: Trivial regurgitation   Tricuspid valve: Moderate regurgitation (regurgitant volume 28cc, regurgitant fraction 36%)   Pulmonic valve: Trivial regurgitation   Aorta: Normal proximal ascending aorta   Pericardium: Small effusion   IMPRESSION: 1. LV apical hypertrophy consistent with apical hypertrophic cardiomyopathy   2.  Apical aneurysm measuring 1mm in diameter   3.  Small apical thrombus   4. Patchy LGE at apex consistent with apical HCM. LGE accounts for <1% total myocardial mass   5.  Normal LV size and systolic function (EF 123456)   6.  Normal RV size and systolic function (EF XX123456)   7. Moderate to severe mitral regurgitation; moderate by regurgitant volume (32cc), moderate to severe by regurgitant fraction (39%)   8. Moderate tricuspid regurgitation (regurgitant volume 28cc, regurgitant fraction 36%)     Echocardiogram 02/11/2022 1. Left ventricular ejection fraction,  by estimation, is 60 to 65%. The  left ventricle has normal function. The left ventricle has no regional  wall motion abnormalities. There is severe asymmetric left ventricular  hypertrophy of the apical segment. Left   ventricular diastolic function could not be evaluated.  2. Right ventricular systolic function is normal. The right ventricular  size is normal. There is severely elevated pulmonary artery systolic  pressure. The estimated right ventricular systolic pressure is 0000000 mmHg.   3. Left atrial size was severely dilated.   4. The mitral valve is abnormal. Moderate to severe mitral valve  regurgitation. No evidence of mitral stenosis.   5. The tricuspid valve is abnormal. Tricuspid valve regurgitation is  moderate to severe.   6. The aortic valve is tricuspid. Aortic valve regurgitation is not  visualized. Aortic valve sclerosis/calcification is present, without any  evidence of aortic stenosis.   7. The inferior vena cava is normal in size with greater than 50%  respiratory variability, suggesting right atrial pressure of 3 mmHg.   Assessment & Plan   1.  Apical thrombus: Noted on recent cardiac MRI 03/25/2022.  I have explained to her the results and the need to change from Eliquis to Coumadin for better anticoagulation, and stroke prophylaxis.  We have contacted the Coumadin clinic will see her first part of next week to be established as a new patient and plan to transition from Eliquis to Coumadin with follow-up labs.  They verbalized understanding of this transition and are willing to follow-up for labs and appointments.  They prefer afternoon as they live 40 minutes away.  2.  Dependent edema: Worsened with addition of gabapentin which she has been taking for chronic back pain and disc protrusion.  She no longer wishes to take gabapentin as it has worsened the symptoms.  I have given her HCTZ 12.5 mg which she can take as needed only to assist with lower extremity edema.  I  prefer that she keep her feet elevated and avoid salt.  Diltiazem may be contributing as well as the combination with gabapentin.  Hopefully this will help her to feel better.  If she continues to have recurrent lower extremity edema may need to consider other options.  3.  Nonrheumatic valvular heart disease: History of moderate to severe MR and moderate to severe TR per echocardiogram on 02/11/2022.  Will need to discuss further with Dr. Alda Lea on follow-up concerning any further invasive procedures or intervention.  With her age and comorbidities this will be deferred to cardiologist.  4.  Permanent atrial fibrillation: Continues on Eliquis until transition to Coumadin as discussed above.  Heart rate is well-controlled.  No other changes other than discussed concerning change of anticoagulation therapy.  Current medicines are reviewed at length with the patient today.  I have spent 30 min's  dedicated to the care of this patient on the date of this encounter to include pre-visit review of records, assessment, management and diagnostic testing,with shared decision making. Signed, Phill Myron. West Pugh, ANP, AACC   04/05/2022 4:53 PM      Office [phone] Fax [phone]  Notice: This dictation was prepared with Dragon dictation along with smaller phrase technology. Any transcriptional errors that result from this process are unintentional and may not be corrected upon review.

## 2022-04-05 ENCOUNTER — Ambulatory Visit: Payer: Medicare HMO | Attending: Adult Health | Admitting: Adult Health

## 2022-04-05 ENCOUNTER — Encounter: Payer: Self-pay | Admitting: Adult Health

## 2022-04-05 ENCOUNTER — Telehealth: Payer: Self-pay | Admitting: *Deleted

## 2022-04-05 VITALS — BP 138/60 | HR 60 | Ht 64.0 in | Wt 133.0 lb

## 2022-04-05 DIAGNOSIS — I513 Intracardiac thrombosis, not elsewhere classified: Secondary | ICD-10-CM | POA: Diagnosis not present

## 2022-04-05 DIAGNOSIS — I361 Nonrheumatic tricuspid (valve) insufficiency: Secondary | ICD-10-CM | POA: Diagnosis not present

## 2022-04-05 DIAGNOSIS — I340 Nonrheumatic mitral (valve) insufficiency: Secondary | ICD-10-CM | POA: Diagnosis not present

## 2022-04-05 DIAGNOSIS — I4821 Permanent atrial fibrillation: Secondary | ICD-10-CM | POA: Diagnosis not present

## 2022-04-05 DIAGNOSIS — R609 Edema, unspecified: Secondary | ICD-10-CM | POA: Diagnosis not present

## 2022-04-05 MED ORDER — WARFARIN SODIUM 5 MG PO TABS: 5.0000 mg | ORAL_TABLET | Freq: Every day | ORAL | 0 refills | Status: DC

## 2022-04-05 MED ORDER — HYDROCHLOROTHIAZIDE 12.5 MG PO CAPS: 12.5000 mg | ORAL_CAPSULE | ORAL | 3 refills | Status: DC | PRN

## 2022-04-05 NOTE — Patient Instructions (Signed)
Medication Instructions:  Stop Gabapentin. Start HCTZ 12.5 mg ( Take 1 Tablet As Needed For Swelling). *If you need a refill on your cardiac medications before your next appointment, please call your pharmacy*   Lab Work: No Labs If you have labs (blood work) drawn today and your tests are completely normal, you will receive your results only by: [place] (if you have MyChart) OR A paper copy in the mail If you have any lab test that is abnormal or we need to change your treatment, we will call you to review the results.   Testing/Procedures: No Testing    Follow-Up: At [facility], you and your health needs are our priority.  As part of our continuing mission to provide you with exceptional heart care, we have created designated Provider Care Teams.  These Care Teams include your primary Cardiologist (physician) and Advanced Practice Providers (APPs -  Physician Assistants and Nurse Practitioners) who all work together to provide you with the care you need, when you need it.  We recommend signing up for the patient portal called "MyChart".  Sign up information is provided on this After Visit Summary.  MyChart is used to connect with patients for Virtual Visits (Telemedicine).  Patients are able to view lab/test results, encounter notes, upcoming appointments, etc.  Non-urgent messages can be sent to your provider as well.   To learn more about what you can do with MyChart, go to [email or web address].    Your next appointment:   Keep Scheduled Appointment  Provider:   Donato Heinz, MD

## 2022-04-05 NOTE — Telephone Encounter (Addendum)
Received a message that pt needs to be transitioned from Eliquis to Warfarin and a new pt appointment.  Warfarin 5mg  prescription was sent into the pharmacy. Left a message for the pt to call back for instructions and appointment.  Pt will need to start warfarin 5mg  daily in the pm and overlap eliquis 5mg  twice a day for 3 days then on day 4 she will start warfarin 5mg  only. Also, will need an appointment on day 5-7 for an appointment.   Called pt's cell phone and home phone and husband's cell phone without success. Left multiple messages.  At 520pm, left a message for the pt on the primary home phone to continue regualr dose of eliquis tonight and start the warfain 5mg  when she picks it up, the Saturday take regular dose of eliquis, warfarin 5mg  at 5pm  and eliquis at regular pm dose, and Sunday take regular dose of eliquis, warfarin 5mg  at 5pm, and eliquis at regular pm dose and If any issues she can reach me at [phone] with any issues. I will follow up on Monday.

## 2022-04-08 NOTE — Telephone Encounter (Signed)
Spoke with pt and she confirmed she received my messages on Friday and she picked up the warfarin 5mg  tablet. She overlapped with eliquis an warfarin and today was instructed to continue warfarin 5mg  daily at 4p or 5pm. Advised she will have to have weekly INR checks until stable and she verbalized understanding. Set up her an appt on 04/11/22 at 1030am and advised if she runs late from her PCP office (states she has PCP at 930am) to call me at [phone] and she verbalized understanding. She is aware the warfarin education can take 30-40 minutes. She did not have any other questions and was thankful for the follow up call.

## 2022-04-10 DIAGNOSIS — M4856XA Collapsed vertebra, not elsewhere classified, lumbar region, initial encounter for fracture: Secondary | ICD-10-CM | POA: Diagnosis not present

## 2022-04-11 ENCOUNTER — Ambulatory Visit: Payer: Medicare HMO | Attending: Cardiology | Admitting: *Deleted

## 2022-04-11 DIAGNOSIS — D6869 Other thrombophilia: Secondary | ICD-10-CM | POA: Diagnosis not present

## 2022-04-11 DIAGNOSIS — I513 Intracardiac thrombosis, not elsewhere classified: Secondary | ICD-10-CM | POA: Diagnosis not present

## 2022-04-11 DIAGNOSIS — K5901 Slow transit constipation: Secondary | ICD-10-CM | POA: Diagnosis not present

## 2022-04-11 DIAGNOSIS — I480 Paroxysmal atrial fibrillation: Secondary | ICD-10-CM

## 2022-04-11 DIAGNOSIS — Z5181 Encounter for therapeutic drug level monitoring: Secondary | ICD-10-CM | POA: Insufficient documentation

## 2022-04-11 DIAGNOSIS — E039 Hypothyroidism, unspecified: Secondary | ICD-10-CM | POA: Diagnosis not present

## 2022-04-11 DIAGNOSIS — D649 Anemia, unspecified: Secondary | ICD-10-CM | POA: Diagnosis not present

## 2022-04-11 DIAGNOSIS — Z9181 History of falling: Secondary | ICD-10-CM | POA: Diagnosis not present

## 2022-04-11 DIAGNOSIS — M545 Low back pain, unspecified: Secondary | ICD-10-CM | POA: Diagnosis not present

## 2022-04-11 DIAGNOSIS — I422 Other hypertrophic cardiomyopathy: Secondary | ICD-10-CM | POA: Diagnosis not present

## 2022-04-11 HISTORY — DX: Intracardiac thrombosis, not elsewhere classified: I51.3

## 2022-04-11 LAB — POCT INR: INR: 8 — AB (ref 2.0–3.0)

## 2022-04-11 LAB — PROTIME-INR
INR: >10 (ref 0.9–1.2)
Prothrombin Time: >120 s — ABNORMAL HIGH (ref 9.1–12.0)

## 2022-04-11 NOTE — Patient Instructions (Addendum)
A full discussion of the nature of anticoagulants has been carried out.  A benefit risk analysis has been presented to the patient, so that they understand the justification for choosing anticoagulation at this time. The need for frequent and regular monitoring, precise dosage adjustment and compliance is stressed.  Side effects of potential bleeding are discussed.  The patient should avoid any OTC items containing aspirin or ibuprofen, and should avoid great swings in general diet.  Avoid alcohol consumption.  Call if any signs of abnormal bleeding.    Description   Spoke with pt and advised not to take any warfarin today, no warfarin on tomorrow-Friday, no warfarin Saturday, no warfarin Sunday then report to clinic to have INR checked on Monday. Pt made aware that tablet strength will need to be changed and it will be determined at Monday visit. Report to ER with any bleeding, falls, accidents. Anticoagulation Clinic [phone]

## 2022-04-15 ENCOUNTER — Ambulatory Visit: Payer: Medicare HMO | Attending: Cardiovascular Disease | Admitting: Pharmacist

## 2022-04-15 DIAGNOSIS — I480 Paroxysmal atrial fibrillation: Secondary | ICD-10-CM

## 2022-04-15 DIAGNOSIS — Z5181 Encounter for therapeutic drug level monitoring: Secondary | ICD-10-CM

## 2022-04-15 DIAGNOSIS — I513 Intracardiac thrombosis, not elsewhere classified: Secondary | ICD-10-CM | POA: Diagnosis not present

## 2022-04-15 LAB — POCT INR: INR: 3.9 — AB (ref 2.0–3.0)

## 2022-04-15 NOTE — Patient Instructions (Addendum)
Description   Reduce dose of warfarin to 2.5mg  (1/2 tablet) once a day in the evening.  Please call the Coumadin Clinic at [phone] with any questions/

## 2022-04-18 ENCOUNTER — Ambulatory Visit (INDEPENDENT_AMBULATORY_CARE_PROVIDER_SITE_OTHER): Payer: Medicare HMO

## 2022-04-18 DIAGNOSIS — I480 Paroxysmal atrial fibrillation: Secondary | ICD-10-CM

## 2022-04-19 ENCOUNTER — Other Ambulatory Visit: Payer: Self-pay | Admitting: Family Medicine

## 2022-04-19 DIAGNOSIS — M5459 Other low back pain: Secondary | ICD-10-CM

## 2022-04-23 ENCOUNTER — Telehealth: Payer: Self-pay | Admitting: *Deleted

## 2022-04-23 ENCOUNTER — Ambulatory Visit (INDEPENDENT_AMBULATORY_CARE_PROVIDER_SITE_OTHER): Payer: Medicare HMO

## 2022-04-23 ENCOUNTER — Ambulatory Visit: Payer: Medicare HMO | Attending: Cardiology | Admitting: Student

## 2022-04-23 ENCOUNTER — Encounter: Payer: Self-pay | Admitting: Student

## 2022-04-23 VITALS — BP 160/65 | HR 60

## 2022-04-23 DIAGNOSIS — I10 Essential (primary) hypertension: Secondary | ICD-10-CM | POA: Diagnosis not present

## 2022-04-23 DIAGNOSIS — I480 Paroxysmal atrial fibrillation: Secondary | ICD-10-CM | POA: Diagnosis not present

## 2022-04-23 DIAGNOSIS — Z5181 Encounter for therapeutic drug level monitoring: Secondary | ICD-10-CM

## 2022-04-23 DIAGNOSIS — I513 Intracardiac thrombosis, not elsewhere classified: Secondary | ICD-10-CM

## 2022-04-23 LAB — CUP PACEART REMOTE DEVICE CHECK
Battery Remaining Longevity: 102 mo
Battery Remaining Percentage: 87 %
Battery Voltage: 3.02 V
Brady Statistic AP VP Percent: 1 %
Brady Statistic AP VS Percent: 35 %
Brady Statistic AS VP Percent: 1 %
Brady Statistic AS VS Percent: 65 %
Brady Statistic RA Percent Paced: 34 %
Brady Statistic RV Percent Paced: 1 %
Date Time Interrogation Session: 20240402072420
Implantable Lead Connection Status: 753985
Implantable Lead Connection Status: 753985
Implantable Lead Implant Date: 20220627
Implantable Lead Implant Date: 20220627
Implantable Lead Location: 753859
Implantable Lead Location: 753860
Implantable Pulse Generator Implant Date: 20220627
Lead Channel Impedance Value: 400 Ohm
Lead Channel Impedance Value: 550 Ohm
Lead Channel Pacing Threshold Amplitude: 0.75 V
Lead Channel Pacing Threshold Amplitude: 1 V
Lead Channel Pacing Threshold Pulse Width: 0.5 ms
Lead Channel Pacing Threshold Pulse Width: 0.5 ms
Lead Channel Sensing Intrinsic Amplitude: 10.4 mV
Lead Channel Sensing Intrinsic Amplitude: 3.6 mV
Lead Channel Setting Pacing Amplitude: 2.5 V
Lead Channel Setting Pacing Amplitude: 2.5 V
Lead Channel Setting Pacing Pulse Width: 0.5 ms
Lead Channel Setting Sensing Sensitivity: 2 mV
Pulse Gen Model: 2272
Pulse Gen Serial Number: 3933448

## 2022-04-23 LAB — PROTIME-INR
INR: 6.3 (ref 0.9–1.2)
Prothrombin Time: 59.7 s — ABNORMAL HIGH (ref 9.1–12.0)

## 2022-04-23 LAB — POCT INR: INR: 7.6 — AB (ref 2.0–3.0)

## 2022-04-23 MED ORDER — WARFARIN SODIUM 1 MG PO TABS: 1.0000 mg | ORAL_TABLET | Freq: Every day | ORAL | 1 refills | Status: DC

## 2022-04-23 NOTE — Telephone Encounter (Signed)
Results noted, see anticoagulation note in Epic.  

## 2022-04-23 NOTE — Patient Instructions (Signed)
No changes made by your pharmacist Haelee Bolen, PharmD at today's visit:       HOW TO TAKE YOUR BLOOD PRESSURE AT HOME  Rest 5 minutes before taking your blood pressure.  Don't smoke or drink caffeinated beverages for at least 30 minutes before. Take your blood pressure before (not after) you eat. Sit comfortably with your back supported and both feet on the floor (don't cross your legs). Elevate your arm to heart level on a table or a desk. Use the proper sized cuff. It should fit smoothly and snugly around your bare upper arm. There should be enough room to slip a fingertip under the cuff. The bottom edge of the cuff should be 1 inch above the crease of the elbow. Ideally, take 3 measurements at one sitting and record the average.  Important lifestyle changes to control high blood pressure  Intervention  Effect on the BP  Lose extra pounds and watch your waistline Weight loss is one of the most effective lifestyle changes for controlling blood pressure. If you're overweight or obese, losing even a small amount of weight can help reduce blood pressure. Blood pressure might go down by about 1 millimeter of mercury (mm Hg) with each kilogram (about 2.2 pounds) of weight lost.  Exercise regularly As a general goal, aim for at least 30 minutes of moderate physical activity every day. Regular physical activity can lower high blood pressure by about 5 to 8 mm Hg.  Eat a healthy diet Eating a diet rich in whole grains, fruits, vegetables, and low-fat dairy products and low in saturated fat and cholesterol. A healthy diet can lower high blood pressure by up to 11 mm Hg.  Reduce salt (sodium) in your diet Even a small reduction of sodium in the diet can improve heart health and reduce high blood pressure by about 5 to 6 mm Hg.  Limit alcohol One drink equals 12 ounces of beer, 5 ounces of wine, or 1.5 ounces of 80-proof liquor.  Limiting alcohol to less than one drink a day for women or two  drinks a day for men can help lower blood pressure by about 4 mm Hg.   If you have any questions or concerns please use My Chart to send questions or call the office at [phone]  

## 2022-04-23 NOTE — Telephone Encounter (Signed)
Received call from Santiago Glad from [place] with critical lab results INR 6.3 PT 59.7  Forwarded to coumadin clinic to address.

## 2022-04-23 NOTE — Assessment & Plan Note (Signed)
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

## 2022-04-23 NOTE — Patient Instructions (Signed)
Description   INR 7.6, sent pt for STAT lab for STAT INR. Advised pt to go to ED with bleeding. Pt has not taken today's dosage of Warfarin, will hold Warfarin until we call with lab results.  INR 6.3 at lab.  New rx for Warfarin 1mg  tablets has been sent to pharmacy. Called pt with INR results, hold Warfarin x 3 dosages, then start taking 1mg  daily except 2mg  on Mondays, Wednesdays and Fridays.  Recheck in 1 week. Please call the Coumadin Clinic at [phone] with any questions/

## 2022-04-23 NOTE — Progress Notes (Signed)
Office Visit    Patient Name: Paula [name] Date of Encounter: 04/23/2022  Primary Care Provider:  Kathalene Frames, MD Primary Cardiologist:  Donato Heinz, MD  Chief Complaint    Hypertension  Significant Past Medical History   Atrial fibrillation CHADS2-VASc score  - on warfarin; patient cannot tell when she is in AF  Hypertrophic cardiomyopathy    hypothyroidism Last TSH slightly elevated at 4.624; currently on levothyroxine 88 mcg    Allergies  Allergen Reactions   Wound Dressing Adhesive Rash    History of Present Illness    Paula [name] is a 87 y.o. female patient of Dr Gardiner Rhyme, in the office today because of elevated home blood pressure readings.   I saw her just over 2 years ago, at which time she was having problems with nighttime BP elevations.  She was waking up at night, usually within 90 minutes of going to bed, with flushing, pounding in her chest and tremors.  Blood pressure would jump to [id number] systolic.  After drinking some water and listening to soothing music, she would go back to sleep.  We started her on prazosin 1 mg and within a few weeks the episodes were almost completely gone.  She most recently saw Dr. Gardiner Rhyme in December and was doing well, but having some elevated BP readings in the afternoons (130-160's).  He asked that she follow up with PharmD.  She is in the office today with her husband.  She has decompressed vertebrae and lately she is in lot of pain so she does not check at home. Due to pain she is unable to sleep at night for many nights. She has ortho appointments and is hopefull to find solution to this chronic pain. She had visited many doctors last months and at the last doctor visit her BP was ok. Reports she takes her medications regularly. Denies any side effects. Last months she fell twice from dizzy spells. She takes HCTZ only when she notices swelling in her lower extremities. In last couple months she  has not use it at all.    Current Medications: Diovan 160 mg daily HCTZ 12.5 mg prn, diltiazem 360 mg qd, prazosin 1 mg QHS.    Social Hx: no tobacco, rare alcohol, drinks 1 cup of half-caf coffee most days   Diet: does eat takeout regularly, restaurant up the street is a good place to get steamed vegetables   Exercise: activities of daily living   Home BP readings: does not check at home    Intolerances: nkda  Adherence Assessment  Do you ever forget to take your medication? [] Yes [x] No  Do you ever skip doses due to side effects? [] Yes [x] No  Do you have trouble affording your medicines? [] Yes [x] No  Are you ever unable to pick up your medication due to transportation difficulties? [] Yes [x] No  Do you ever stop taking your medications because you don't believe they are helping? [] Yes [x] No  Do you check your weight daily? [] Yes [] No    Accessory Clinical Findings    Lab Results  Component Value Date   CREATININE 1.00 03/20/2022   BUN 15 03/20/2022   NA 127 (L) 03/20/2022   K 3.9 03/20/2022   CL 92 (L) 03/20/2022   CO2 24 03/20/2022   Lab Results  Component Value Date   ALT 55 (H) 03/20/2022   AST 58 (H) 03/20/2022   ALKPHOS 82 03/20/2022   BILITOT 0.9 03/20/2022   No results found for: "  HGBA1C"  Home Medications    Current Outpatient Medications  Medication Sig Dispense Refill   acetaminophen (TYLENOL) 325 MG tablet every 4 (four) hours as needed for moderate pain or headache.     amiodarone (PACERONE) 200 MG tablet TAKE 1 TABLET EVERY DAY EXCEPT SUNDAY 90 tablet 3   diltiazem (CARDIZEM CD) 300 MG 24 hr capsule TAKE 1 CAPSULE EVERY DAY 90 capsule 3   famotidine (PEPCID AC) 10 MG tablet as needed for heartburn or indigestion.     hydrochlorothiazide (MICROZIDE) 12.5 MG capsule Take 1 capsule (12.5 mg total) by mouth as needed (For swelling and edema). 30 capsule 3   levothyroxine (SYNTHROID, LEVOTHROID) 88 MCG tablet Take 88 mcg by mouth daily before  breakfast.     linaclotide (LINZESS) 290 MCG CAPS capsule Take 1 capsule (290 mcg total) by mouth daily before breakfast. 30 capsule 0   Multiple Vitamin (MULTIVITAMIN WITH MINERALS) TABS tablet Take 1 tablet by mouth daily.     polyethylene glycol powder (GLYCOLAX/MIRALAX) 17 GM/SCOOP powder Take 17 g by mouth 2 (two) times daily. 238 g 0   prazosin (MINIPRESS) 1 MG capsule TAKE 1 CAPSULE AT BEDTIME 90 capsule 3   rosuvastatin (CRESTOR) 5 MG tablet TAKE 1 TABLET EVERY DAY 90 tablet 2   sodium chloride (OCEAN) 0.65 % SOLN nasal spray Place 1 spray into both nostrils as needed for congestion.     valsartan (DIOVAN) 160 MG tablet Take 1 tablet (160 mg total) by mouth daily. 90 tablet 3   warfarin (COUMADIN) 1 MG tablet Take 1 tablet (1 mg total) by mouth daily. 35 tablet 1   Calcium Carb-Cholecalciferol 600-5 MG-MCG TABS daily at 6 (six) AM.     No current facility-administered medications for this visit.     HYPERTENSION CONTROL Vitals:   04/23/22 1001 04/23/22 1002  BP: (!) 147/66 (!) 160/65    The patient's blood pressure is elevated above target today.  In order to address the patient's elevated BP: A referral to the PharmD Hypertension Clinic will be placed.; Blood pressure will be monitored at home to determine if medication changes need to be made.     Assessment & Plan    Hypertension Assessment: BP is uncontrolled in office 1st BP 147/66 mmHg with HR 60 2nd measurement 160/65 (goal <130/80) Takes current BP medications regularly and tolerates them well without any side effects  Had falls X2 from dizziness in last month   Denies SOB, palpitation, chest pain, headaches,or swelling Patient is in chronic sever pain from decompressed vertebrae - unable to sleep at night  Reiterated the importance of regular exercise and low salt diet   Plan:  Given the chronic pain, recent falls from dizzy spells and not having home readings will be not making any changes to current BP  medications  Continue taking Diovan 160 mg daily HCTZ 12.5 mg prn, diltiazem 360 mg qd, prazosin 1 mg QHS Patient to keep record of BP readings with heart rate and report to [place] at the next visit Patient to see PharmD in 5 weeks for follow up  Follow up lab(s) :none   Cammy Copa, Pharm.D [facility] of [facility] [address] [place], [place] [address]  Phone: [phone]; Fax: [phone]

## 2022-04-24 ENCOUNTER — Ambulatory Visit
Admission: RE | Admit: 2022-04-24 | Discharge: 2022-04-24 | Disposition: A | Discharge status: Home / Self Care | Payer: Medicare HMO | Source: Ambulatory Visit | Attending: Family Medicine | Admitting: Family Medicine

## 2022-04-24 DIAGNOSIS — M48061 Spinal stenosis, lumbar region without neurogenic claudication: Secondary | ICD-10-CM | POA: Diagnosis not present

## 2022-04-24 DIAGNOSIS — M5459 Other low back pain: Secondary | ICD-10-CM

## 2022-04-24 DIAGNOSIS — M5126 Other intervertebral disc displacement, lumbar region: Secondary | ICD-10-CM | POA: Diagnosis not present

## 2022-04-24 DIAGNOSIS — M4316 Spondylolisthesis, lumbar region: Secondary | ICD-10-CM | POA: Diagnosis not present

## 2022-04-24 MED ORDER — ONDANSETRON HCL 4 MG/2ML IJ SOLN: 4.0000 mg | Freq: Once | INTRAMUSCULAR | Status: DC | PRN

## 2022-04-24 MED ORDER — MEPERIDINE HCL 50 MG/ML IJ SOLN: 50.0000 mg | Freq: Once | INTRAMUSCULAR | Status: DC | PRN

## 2022-04-24 MED ORDER — DIAZEPAM 5 MG PO TABS
5.0000 mg | ORAL_TABLET | Freq: Once | ORAL | Status: AC
  Administered 2022-04-24: 5 mg via ORAL

## 2022-04-24 MED ORDER — IOPAMIDOL (ISOVUE-M 200) INJECTION 41%
20.0000 mL | Freq: Once | INTRAMUSCULAR | Status: AC
  Administered 2022-04-24: 20 mL via INTRATHECAL

## 2022-04-24 NOTE — Discharge Instructions (Signed)

## 2022-04-29 DIAGNOSIS — M4856XA Collapsed vertebra, not elsewhere classified, lumbar region, initial encounter for fracture: Secondary | ICD-10-CM | POA: Diagnosis not present

## 2022-04-30 ENCOUNTER — Ambulatory Visit: Payer: Medicare HMO | Attending: Cardiology

## 2022-04-30 DIAGNOSIS — I513 Intracardiac thrombosis, not elsewhere classified: Secondary | ICD-10-CM

## 2022-04-30 DIAGNOSIS — Z5181 Encounter for therapeutic drug level monitoring: Secondary | ICD-10-CM

## 2022-04-30 DIAGNOSIS — I480 Paroxysmal atrial fibrillation: Secondary | ICD-10-CM | POA: Diagnosis not present

## 2022-04-30 LAB — POCT INR: INR: 1.3 — AB (ref 2.0–3.0)

## 2022-04-30 NOTE — Patient Instructions (Signed)
Description   Take 2 tablets (2mg ) today, then start taking 2 tablets (2mg ) daily except 1tablet (1mg ) on Tuesdays and Saturdays.  Recheck in 1 week. Please call the Coumadin Clinic at [phone] with any questions/

## 2022-05-07 ENCOUNTER — Ambulatory Visit: Payer: Medicare HMO | Attending: Cardiology

## 2022-05-07 DIAGNOSIS — I480 Paroxysmal atrial fibrillation: Secondary | ICD-10-CM

## 2022-05-07 DIAGNOSIS — I513 Intracardiac thrombosis, not elsewhere classified: Secondary | ICD-10-CM

## 2022-05-07 DIAGNOSIS — Z5181 Encounter for therapeutic drug level monitoring: Secondary | ICD-10-CM

## 2022-05-07 LAB — POCT INR: INR: 1.6 — AB (ref 2.0–3.0)

## 2022-05-07 MED ORDER — WARFARIN SODIUM 1 MG PO TABS: ORAL_TABLET | ORAL | 1 refills | Status: DC

## 2022-05-07 NOTE — Patient Instructions (Signed)
Description   Take 3 tablets ( ) today, then start taking 2 tablets ( ) daily.  Recheck in 1 week. Please call the Coumadin Clinic at [phone] with any questions/

## 2022-05-13 DIAGNOSIS — D649 Anemia, unspecified: Secondary | ICD-10-CM | POA: Diagnosis not present

## 2022-05-15 ENCOUNTER — Ambulatory Visit: Payer: Medicare HMO | Attending: Internal Medicine | Admitting: Pharmacist

## 2022-05-15 DIAGNOSIS — I480 Paroxysmal atrial fibrillation: Secondary | ICD-10-CM

## 2022-05-15 DIAGNOSIS — I513 Intracardiac thrombosis, not elsewhere classified: Secondary | ICD-10-CM

## 2022-05-15 DIAGNOSIS — Z5181 Encounter for therapeutic drug level monitoring: Secondary | ICD-10-CM

## 2022-05-15 LAB — POCT INR: INR: 1.8 — AB (ref 2.0–3.0)

## 2022-05-15 NOTE — Patient Instructions (Addendum)
Description   Take 3 tablets ( ) today, then start taking 2 tablets ( ) daily except for 3 tablets ( ) on Mondays and Fridays.  Recheck in 2 weeks. Please call the Coumadin Clinic at [phone] with any questions/

## 2022-05-22 NOTE — Progress Notes (Signed)
Remote pacemaker transmission.   

## 2022-05-23 ENCOUNTER — Ambulatory Visit: Payer: Medicare HMO | Attending: Cardiology | Admitting: Cardiology

## 2022-05-23 ENCOUNTER — Encounter: Payer: Self-pay | Admitting: *Deleted

## 2022-05-23 ENCOUNTER — Encounter: Payer: Self-pay | Admitting: Cardiology

## 2022-05-23 VITALS — BP 172/74 | HR 71 | Ht 64.0 in | Wt 128.6 lb

## 2022-05-23 DIAGNOSIS — I361 Nonrheumatic tricuspid (valve) insufficiency: Secondary | ICD-10-CM

## 2022-05-23 DIAGNOSIS — I340 Nonrheumatic mitral (valve) insufficiency: Secondary | ICD-10-CM | POA: Diagnosis not present

## 2022-05-23 DIAGNOSIS — I480 Paroxysmal atrial fibrillation: Secondary | ICD-10-CM

## 2022-05-23 DIAGNOSIS — I422 Other hypertrophic cardiomyopathy: Secondary | ICD-10-CM | POA: Diagnosis not present

## 2022-05-23 DIAGNOSIS — I513 Intracardiac thrombosis, not elsewhere classified: Secondary | ICD-10-CM

## 2022-05-23 DIAGNOSIS — I471 Supraventricular tachycardia, unspecified: Secondary | ICD-10-CM | POA: Diagnosis not present

## 2022-05-23 DIAGNOSIS — I10 Essential (primary) hypertension: Secondary | ICD-10-CM | POA: Diagnosis not present

## 2022-05-23 MED ORDER — FUROSEMIDE 20 MG PO TABS: 20.0000 mg | ORAL_TABLET | Freq: Every day | ORAL | 3 refills | Status: DC | PRN

## 2022-05-23 NOTE — Progress Notes (Signed)
Cardiology Office Note:    Date:  05/30/2022   ID:  Paula [name], DOB 12-20-1935, MRN [id number]  PCP:  Emilio Aspen, MD  Cardiologist:  Little Ishikawa, MD  Electrophysiologist:  Lanier Prude, MD   Referring MD: Merlene Laughter, MD   Chief Complaint  Patient presents with   Cardiomyopathy    History of Present Illness:    Paula [name] is a 87 y.o. female with a hx of paroxysmal atrial fibrillation, tachybrady syndrome status post PPM, hypertension, hypothyroidism who presents for follow-up.  She was referred by Alphonzo Severance, PA for evaluation of hypertrophic cardiomyopathy, initially seen on 06/14/2019.  Diagnosed with atrial fibrillation after presenting with palpitations to PCP office in April. EKG showed A. fib with RVR. Started on Eliquis given CHA2DS2-VASc score 5.  Also started on diltiazem for rate control. She was seen in AF clinic on 05/11/2019, she was noted to be in normal sinus rhythm. She was continued on Eliquis 5 mg twice daily and diltiazem 180 mg daily. TTE on 05/27/19 was concerning for apical variant hypertrophic cardiomyopathy, EF 60 to 65%, normal RV function, moderate elevation in RVSP (48 mmHg), mild mitral regurgitation, mild to moderate tricuspid regurgitation.  Cardiac MRI on 07/05/2019 showed LV apical hypertrophy measuring up to 15 mm, consistent with apical hypertrophic cardiomyopathy, small apical aneurysm, patchy apical LGE accounting for 1% of total myocardial mass, hyperdynamic LV systolic function, normal RV function, mild MR (regurgitant fraction 24%).  Zio patch x7 days on 07/14/2019 showed AF burden 47%, with episode lasting 3 days and average rate 118 bpm, no NSVT, frequent episodes of SVT, longest lasting 19 seconds.  She was admitted in June 2022 with near syncope, underwent PPM for tachybrady syndrome.  Echo 02/11/2022 showed EF 60 to 65%, severe asymmetric LV apical hypertrophy, normal RV function, severe left atrial  enlargement, moderate to severe mitral regurgitation, moderate to severe tricuspid regurgitation.  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  Since last clinic visit, she reports she has been having pain due to a compressed vertebrae.  She was prescribed tramadol but has not started taking.  Denies any lower extremity edema.  No chest pain or dyspnea.  Wt Readings from Last 3 Encounters:  05/23/22 128 lb 9.6 oz (58.3 kg)  04/05/22 133 lb (60.3 kg)  03/19/22 135 lb 5.8 oz (61.4 kg)     Past Medical History:  Diagnosis Date   CHF (congestive heart failure) (HCC)    Hypothyroidism    Irregular heart rate    Thyroid disease     Past Surgical History:  Procedure Laterality Date   PACEMAKER IMPLANT N/A 07/17/2020   Procedure: PACEMAKER IMPLANT;  Surgeon: Marinus Maw, MD;  Location: [facility];  Service: Cardiovascular;  Laterality: N/A;    Current Medications: Current Meds  Medication Sig   acetaminophen (TYLENOL) 325 MG tablet every 4 (four) hours as needed for moderate pain or headache.   amiodarone (PACERONE) 200 MG tablet TAKE 1 TABLET EVERY DAY EXCEPT SUNDAY   Calcium Carb-Cholecalciferol 600-5 MG-MCG TABS daily at 6 (six) AM.   diltiazem (CARDIZEM CD) 300 MG 24 hr capsule TAKE 1 CAPSULE EVERY DAY   famotidine (PEPCID AC) 10 MG tablet as  needed for heartburn or indigestion.   Ferrous Sulfate (IRON PO) Take 1 tablet by mouth every other day.   furosemide (LASIX) 20 MG tablet Take 1 tablet (20 mg total) by mouth daily as needed.   levothyroxine (SYNTHROID, LEVOTHROID) 88 MCG tablet Take 88 mcg by mouth daily before breakfast.   linaclotide (LINZESS) 290 MCG  CAPS capsule Take 1 capsule (290 mcg total) by mouth daily before breakfast.   Multiple Vitamin (MULTIVITAMIN WITH MINERALS) TABS tablet Take 1 tablet by mouth daily.   polyethylene glycol powder (GLYCOLAX/MIRALAX) 17 GM/SCOOP powder Take 17 g by mouth 2 (two) times daily.   prazosin (MINIPRESS) 1 MG capsule TAKE 1 CAPSULE AT BEDTIME   rosuvastatin (CRESTOR) 5 MG tablet TAKE 1 TABLET EVERY DAY   sodium chloride (OCEAN) 0.65 % SOLN nasal spray Place 1 spray into both nostrils as needed for congestion.   warfarin (COUMADIN) 1 MG tablet Take 2 tablets daily or as directed by Coumadin Clinic.   [DISCONTINUED] hydrochlorothiazide (MICROZIDE) 12.5 MG capsule Take 1 capsule (12.5 mg total) by mouth as needed (For swelling and edema).   [DISCONTINUED] valsartan (DIOVAN) 160 MG tablet Take 1 tablet (160 mg total) by mouth daily.     Allergies:   Wound dressing adhesive   Social History   Socioeconomic History   Marital status: Married    Spouse name: Not on file   Number of children: Not on file   Years of education: Not on file   Highest education level: Not on file  Occupational History   Not on file  Tobacco Use   Smoking status: Never   Smokeless tobacco: Never  Vaping Use   Vaping Use: Never used  Substance and Sexual Activity   Alcohol use: Yes    Alcohol/week: 1.0 standard drink of alcohol    Types: 1 Glasses of wine per week   Drug use: No   Sexual activity: Not Currently    Birth control/protection: Post-menopausal  Other Topics Concern   Not on file  Social History Narrative   Not on file   Social Determinants of Health   Financial Resource Strain: Not on file  Food Insecurity: Not on file  Transportation Needs: Not on file  Physical Activity: Not on file  Stress: Not on file  Social Connections: Not on file     Family History: The patient's family history includes Breast cancer (age of onset: 14) in her sister.  ROS:   Please see the history of present illness.      All other systems reviewed and are negative.  EKGs/Labs/Other Studies Reviewed:    The following studies were reviewed today:  Monitor 05/01/2020: No significant abnormalities   Patch Wear Time:  2 days and 23 hours (2022-04-01T16:38:40-0400 to 2022-04-04T16:10:58-0400)   Patient had a min HR of 35 bpm, max HR of 119 bpm, and avg HR of 61 bpm. Predominant underlying rhythm was Sinus Rhythm. 2 Supraventricular Tachycardia runs occurred, the run with the fastest interval lasting 4 beats with a max rate of 119 bpm, the longest lasting 4 beats with an avg rate of 98 bpm. Isolated SVEs were rare (<1.0%), SVE Couplets were rare (<1.0%), and SVE Triplets were rare (<1.0%). Isolated VEs were rare (<1.0%), and no VE Couplets or VE Triplets were present.  No patient triggered events  Monitor 07/14/2019: Episode of atrial fibrillation lasting 3 days 6 hours, average rate 118 bpm. AF burden 47%. 141 episodes of SVT, longest lasting 18.5 seconds.   7 days  of data recorded on Zio monitor. Patient had a min HR of 55 bpm, max HR of 207 bpm, and avg HR of 95 bpm. Predominant underlying rhythm was Sinus Rhythm. No VT, high degree block, or pauses noted.  141 episodes of SVT, longest lasting 18.5 seconds.  Episode of atrial fibrillation lasting 3 days 6 hours, average rate 118 bpm.  AF burden 47%.   Isolated atrial and ventricular ectopy was rare (<1%). There were 0 triggered events.  CMR 07/05/2019: 1. LV hypertrophy at apex measuring up to 15mm, consistent with apical hypertrophic cardiomyopathy   2.  Small apical aneurysm   3. Patchy LGE at apex, consistent with apical HCM. LGE accounts for 1% of total myocardial mass   4.  Normal LV size with hyperdynamic systolic function (EF 70%)   5.  Normal RV size and systolic function (EF 58%)   6. Mild mitral regurgitation (regurgitant volume 16cc, regurgitant fraction 24%)  Echo 05/27/2019: 1. Apical variant hypertrophic cardiomyopathy with no evidence  of apical  thrombus. Left ventricular ejection fraction, by estimation, is 60 to 65%.  The left ventricle has normal function. The left ventricle demonstrates  regional wall motion  abnormalities (see scoring diagram/findings for description). There is  severe left ventricular hypertrophy of the apical segment. Left  ventricular diastolic parameters are consistent with Grade I diastolic  dysfunction (impaired relaxation).   2. Right ventricular systolic function is normal. The right ventricular  size is normal. There is moderately elevated pulmonary artery systolic  pressure. The estimated right ventricular systolic pressure is 47.9 mmHg.   3. Left atrial size was mildly dilated.   4. The mitral valve is normal in structure. Mild mitral valve  regurgitation. No evidence of mitral stenosis.   5. Tricuspid valve regurgitation is mild to moderate.   6. Possible fusion of right and non coronary cusp. The aortic valve is  normal in structure. Aortic valve regurgitation is not visualized. Mild to  moderate aortic valve sclerosis/calcification is present, without any  evidence of aortic stenosis.   7. The inferior vena cava is normal in size with greater than 50%  respiratory variability, suggesting right atrial pressure of 3 mmHg.  Exercise Myoview 07/12/2016: Nuclear stress EF: 64%. Blood pressure demonstrated a normal response to exercise. Horizontal ST segment depression ST segment depression was noted during stress in the III, aVF, V4, V5 and V6 leads, and returning to baseline after 1-5 minutes of recovery. Findings are nonspecific given baseline T wave inversions. The study is normal. This is a low risk study. The left ventricular ejection fraction is normal (55-65%).   EKG:   01/08/2022: Atrial paced, rate 60, poor R wave progression, nonspecific T wave flattening 07/04/2021: Atrial paced, rate 60, Q waves in V1/2, nonspecific T wave flattening 12/27/20: Atrial paced, rate 60, Q waves  in V1/2, no ST abnormalities 07/10/2020: EKG is not ordered today. 04/14/2020: sinus rhythm, rate 74, LVH with repolarization abnormalities  Recent Labs: 03/19/2022: TSH 3.074 05/28/2022: ALT 108; BNP 154.5; BUN 11; Creatinine, Ser 1.00; Hemoglobin 11.8; Magnesium 2.2; Platelets 379; Potassium 4.1; Sodium 132  Recent Lipid Panel    Component Value Date/Time   CHOL 174 03/20/2021 0809   TRIG 79 03/20/2021 0809   HDL 93 03/20/2021 0809   CHOLHDL 1.9 03/20/2021 0809   LDLCALC 66 03/20/2021 0809    Physical Exam:    VS:  BP (!) 172/74 (BP Location: Left Arm, Patient Position: Sitting, Cuff Size: Normal)   Pulse 71   Ht  5\' 4"  (1.626 m)   Wt 128 lb 9.6 oz (58.3 kg)   SpO2 95%   BMI 22.07 kg/m     Wt Readings from Last 3 Encounters:  05/23/22 128 lb 9.6 oz (58.3 kg)  04/05/22 133 lb (60.3 kg)  03/19/22 135 lb 5.8 oz (61.4 kg)     GEN: in no acute distress HEENT: Normal NECK: No JVD; No carotid bruits CARDIAC: RRR, 2/6 systolic murmur RESPIRATORY:  Clear to auscultation without rales, wheezing or rhonchi  ABDOMEN: Soft, non-tender, non-distended MUSCULOSKELETAL:  No edema; No deformity  SKIN: Warm and dry NEUROLOGIC:  Alert and oriented x 3 PSYCHIATRIC:  Normal affect   ASSESSMENT:    1. Hypertrophic cardiomyopathy (HCC)   2. Paroxysmal atrial fibrillation (HCC)   3. SVT (supraventricular tachycardia)   4. Essential hypertension   5. LV (left ventricular) mural thrombus   6. Nonrheumatic mitral valve regurgitation   7. Nonrheumatic tricuspid valve regurgitation       PLAN:    Hypertrophic cardiomyopathy: cardiac MRI on 07/05/2019 showed LV apical hypertrophy measuring up to 15 mm, consistent with apical hypertrophic cardiomyopathy, small apical aneurysm, patchy apical LGE accounting for 1% of total myocardial mass.  No NSVT on Zio patch x 7 days.  Echo 02/11/2022 showed EF 60 to 65%, severe asymmetric LV apical hypertrophy, normal RV function, severe left atrial  enlargement, moderate to severe mitral regurgitation, moderate to severe tricuspid regurgitation.  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  This was developed despite being on Eliquis.  Recommended switching to warfarin -Follows in Coumadin clinic for INR management  Mitral regurgitation/tricuspid regurgitation: Moderate to severe MR and moderate TR on CMR 03/2022.  Will monitor -Mildly volume overloaded on exam today.  Check CMET, BNP.  Recommend Lasix 20 mg x 3 days then would monitor daily weights and take as needed if gains more than 3 pounds in 1 day or 5 pounds in 1 week  Paroxysmal atrial fibrillation: Diagnosed 04/2019 after presenting to PCPs office with palpitations. CHA2DS2-VASc score 5.  Zio patch x7 days on 07/14/2019 showed AF burden 47%, with episode lasting 3 days and average rate 118 bpm.  She was admitted in June 2022 with near syncope, underwent PPM for tachybrady syndrome. -Continue Eliquis 5 mg twice daily -Continue amiodarone 200 mg daily.  Had mild transaminitis on labs in 12/2020, normalized 02/2021.  Normal TSH 12/2021, very mild transaminitis, will monitor -Continue diltiazem 300 mg daily  SVT: Frequent episodes on ZIO monitoring, longest lasting 19 seconds.  Continue diltiazem  Hypertension: On valsartan 160 mg mg daily, prazosin 1 mg nightly, diltiazem 300 mg daily.  BP elevated in clinic today but she reports significant pain in her back.  She was recently prescribed  tramadol but has not started taking yet for pain control.  Advise once pain under better control to check BP twice daily per week and let us know results  Hyperlipidemia: On rosuvastatin 5 mg daily, LDL 66 on 03/20/2021   RTC in 6 weeks  Medication Adjustments/Labs and Tests Ordered: Current medicines are reviewed at length  with the patient today.  Concerns regarding medicines are outlined above.  Orders Placed This Encounter  Procedures   Comprehensive metabolic panel   CBC   Magnesium   Brain natriuretic peptide    Meds ordered this encounter  Medications   furosemide (LASIX) 20 MG tablet    Sig: Take 1 tablet (20 mg total) by mouth daily as needed.    Dispense:  30 tablet    Refill:  3      Patient Instructions  Medication Instructions:  STOP HCTZ START furosemide (Lasix) 20 mg daily for 3 days, then change to AS NEEDED for weight increase of 3 lbs overnight or 5 lbs in 1 week.  *If you need a refill on your cardiac medications before your next appointment, please call your pharmacy*   Lab Work: Please return for labs  (CMET, CBC, Mag, BNP)  Our in office lab hours are Monday-Friday 8:00-4:00, closed for lunch 1-2 pm.  No appointment needed.  [facility] locations:   [facility] - [address]  - [address] ([facility]) - [address] [id number] [address]    - [address]    [place]  - [address]    [place] - [address] - [address] [employer]  - [address] [place] - [address] ([facility])  Follow-Up: At [facility], you and your health needs are our priority.  As part of our continuing mission to provide you with exceptional heart care, we have created designated Provider Care Teams.  These Care Teams include your primary Cardiologist (physician) and Advanced Practice Providers (APPs -   Physician Assistants and Nurse Practitioners) who all work together to provide you with the care you need, when you need it.  We recommend signing up for the patient portal called "MyChart".  Sign up information is provided on this After Visit Summary.  MyChart is used to connect with patients for Virtual Visits (Telemedicine).  Patients are able to view lab/test results, encounter notes, upcoming appointments, etc.  Non-urgent messages can be sent to your provider as well.   To learn more about what you can do with MyChart, go to [email or web address].    Your next appointment:   6 week(s)  Provider:   Little Ishikawa, MD     Other Instructions Recommend compression stockings during the day  Please check your blood pressure at home twice daily, write it down.  Call the office or send message via Mychart with the readings in 1 week for Dr. Bjorn Pippin to review.       Signed, Little Ishikawa, MD  05/30/2022 11:58 PM    [facility]

## 2022-05-23 NOTE — Patient Instructions (Signed)
Medication Instructions:  STOP HCTZ START furosemide (Lasix) 20 mg daily for 3 days, then change to AS NEEDED for weight increase of 3 lbs overnight or 5 lbs in 1 week.  *If you need a refill on your cardiac medications before your next appointment, please call your pharmacy*   Lab Work: Please return for labs  (CMET, CBC, Mag, BNP)  Our in office lab hours are Monday-Friday 8:00-4:00, closed for lunch 1-2 pm.  No appointment needed.  [facility] locations:   [facility] - [address]  - [address] ([facility]) - [address] [phone] [address]   [place] - [address]    [place]  - [address]    [place] - [address] - [address] [employer]  - [address] - [address] ([facility])  Follow-Up: At [facility], you and your health needs are our priority.  As part of our continuing mission to provide you with exceptional heart care, we have created designated Provider Care Teams.  These Care Teams include your primary Cardiologist (physician) and Advanced Practice Providers (APPs -  Physician Assistants and Nurse Practitioners) who all work together to provide you with the care you need, when you need it.  We recommend signing up for the patient portal called "MyChart".  Sign up information is provided on this After Visit Summary.  MyChart is used to connect with patients for Virtual Visits (Telemedicine).  Patients are able to view lab/test results, encounter notes, upcoming appointments, etc.  Non-urgent messages can be sent to your provider as well.   To learn more about what you can do with MyChart, go to [email or web address].    Your next appointment:   6 week(s)  Provider:   Little Ishikawa, MD     Other Instructions Recommend compression stockings during the day  Please check your blood  pressure at home twice daily, write it down.  Call the office or send message via Mychart with the readings in 1 week for Dr. Bjorn Pippin to review.

## 2022-05-28 DIAGNOSIS — I10 Essential (primary) hypertension: Secondary | ICD-10-CM | POA: Diagnosis not present

## 2022-05-28 DIAGNOSIS — I422 Other hypertrophic cardiomyopathy: Secondary | ICD-10-CM | POA: Diagnosis not present

## 2022-05-28 DIAGNOSIS — I480 Paroxysmal atrial fibrillation: Secondary | ICD-10-CM | POA: Diagnosis not present

## 2022-05-28 DIAGNOSIS — I471 Supraventricular tachycardia, unspecified: Secondary | ICD-10-CM | POA: Diagnosis not present

## 2022-05-29 LAB — COMPREHENSIVE METABOLIC PANEL
ALT: 108 IU/L — ABNORMAL HIGH (ref 0–32)
AST: 110 IU/L — ABNORMAL HIGH (ref 0–40)
Albumin/Globulin Ratio: 1.7 (ref 1.2–2.2)
Albumin: 4.3 g/dL (ref 3.7–4.7)
Alkaline Phosphatase: 115 IU/L (ref 44–121)
BUN/Creatinine Ratio: 11 — ABNORMAL LOW (ref 12–28)
BUN: 11 mg/dL (ref 8–27)
Bilirubin Total: 0.4 mg/dL (ref 0.0–1.2)
CO2: 25 mmol/L (ref 20–29)
Calcium: 9.4 mg/dL (ref 8.7–10.3)
Chloride: 94 mmol/L — ABNORMAL LOW (ref 96–106)
Creatinine, Ser: 1 mg/dL (ref 0.57–1.00)
Globulin, Total: 2.6 g/dL (ref 1.5–4.5)
Glucose: 98 mg/dL (ref 70–99)
Potassium: 4.1 mmol/L (ref 3.5–5.2)
Sodium: 132 mmol/L — ABNORMAL LOW (ref 134–144)
Total Protein: 6.9 g/dL (ref 6.0–8.5)
eGFR: 56 mL/min/{1.73_m2} — ABNORMAL LOW (ref >59)

## 2022-05-29 LAB — CBC
Hematocrit: 37 % (ref 34.0–46.6)
Hemoglobin: 11.8 g/dL (ref 11.1–15.9)
MCH: 26.6 pg (ref 26.6–33.0)
MCHC: 31.9 g/dL (ref 31.5–35.7)
MCV: 84 fL (ref 79–97)
Platelets: 379 10*3/uL (ref 150–450)
RBC: 4.43 x10E6/uL (ref 3.77–5.28)
RDW: 18.9 % — ABNORMAL HIGH (ref 11.7–15.4)
WBC: 7.1 10*3/uL (ref 3.4–10.8)

## 2022-05-29 LAB — BRAIN NATRIURETIC PEPTIDE: BNP: 154.5 pg/mL — ABNORMAL HIGH (ref 0.0–100.0)

## 2022-05-29 LAB — MAGNESIUM: Magnesium: 2.2 mg/dL (ref 1.6–2.3)

## 2022-05-30 ENCOUNTER — Ambulatory Visit: Payer: Medicare HMO | Attending: Cardiology | Admitting: Student

## 2022-05-30 ENCOUNTER — Encounter: Payer: Self-pay | Admitting: Student

## 2022-05-30 ENCOUNTER — Ambulatory Visit (INDEPENDENT_AMBULATORY_CARE_PROVIDER_SITE_OTHER): Payer: Medicare HMO

## 2022-05-30 VITALS — BP 161/74 | HR 67

## 2022-05-30 DIAGNOSIS — I10 Essential (primary) hypertension: Secondary | ICD-10-CM

## 2022-05-30 DIAGNOSIS — Z5181 Encounter for therapeutic drug level monitoring: Secondary | ICD-10-CM | POA: Diagnosis not present

## 2022-05-30 DIAGNOSIS — I480 Paroxysmal atrial fibrillation: Secondary | ICD-10-CM | POA: Diagnosis not present

## 2022-05-30 LAB — POCT INR: INR: 2.1 (ref 2.0–3.0)

## 2022-05-30 MED ORDER — VALSARTAN 160 MG PO TABS: 240.0000 mg | ORAL_TABLET | Freq: Every day | ORAL | 3 refills | Status: DC

## 2022-05-30 NOTE — Patient Instructions (Addendum)
  Changes made by your pharmacist Carmela Hurt, PharmD at today's visit:    Instructions/Changes  (what do you need to do) Your Notes  (what you did and when you did it)  1.Change valsartan time from morning to mid day and increase dose from 1 tablet to 1.5 tablet    2. Continue taking other BP same as before     New schedule:   Morning Furosemide 20 mg when needed  diltiazem 360 mg daily  Mid day  Valsartan 240 mg daily (1.5 tab of 160 mg) Bedtime  prazosin 1 mg every night Bring all of your meds, your BP cuff and your record of home blood pressures to your next appointment.    HOW TO TAKE YOUR BLOOD PRESSURE AT HOME  Rest 5 minutes before taking your blood pressure.  Don't smoke or drink caffeinated beverages for at least 30 minutes before. Take your blood pressure before (not after) you eat. Sit comfortably with your back supported and both feet on the floor (don't cross your legs). Elevate your arm to heart level on a table or a desk. Use the proper sized cuff. It should fit smoothly and snugly around your bare upper arm. There should be enough room to slip a fingertip under the cuff. The bottom edge of the cuff should be 1 inch above the crease of the elbow. Ideally, take 3 measurements at one sitting and record the average.  Important lifestyle changes to control high blood pressure  Intervention  Effect on the BP  Lose extra pounds and watch your waistline Weight loss is one of the most effective lifestyle changes for controlling blood pressure. If you're overweight or obese, losing even a small amount of weight can help reduce blood pressure. Blood pressure might go down by about 1 millimeter of mercury (mm Hg) with each kilogram (about 2.2 pounds) of weight lost.  Exercise regularly As a general goal, aim for at least 30 minutes of moderate physical activity every day. Regular physical activity can lower high blood pressure by about 5 to 8 mm Hg.  Eat a healthy  diet Eating a diet rich in whole grains, fruits, vegetables, and low-fat dairy products and low in saturated fat and cholesterol. A healthy diet can lower high blood pressure by up to 11 mm Hg.  Reduce salt (sodium) in your diet Even a small reduction of sodium in the diet can improve heart health and reduce high blood pressure by about 5 to 6 mm Hg.  Limit alcohol One drink equals 12 ounces of beer, 5 ounces of wine, or 1.5 ounces of 80-proof liquor.  Limiting alcohol to less than one drink a day for women or two drinks a day for men can help lower blood pressure by about 4 mm Hg.   If you have any questions or concerns please use My Chart to send questions or call the office at [phone]

## 2022-05-30 NOTE — Patient Instructions (Signed)
Description   Take 2.5 tablets today, then continue taking 2 tablets (2mg ) daily except for 3 tablets (3mg ) on Mondays and Fridays.   Recheck in 1 week.  Please call the Coumadin Clinic at [phone] with any questions

## 2022-05-30 NOTE — Progress Notes (Signed)
Office Visit    Patient Name: Paula [name] Date of Encounter: 05/30/2022  Primary Care Provider:  Emilio Aspen, MD Primary Cardiologist:  Paula Ishikawa, MD  Chief Complaint    Hypertension  Significant Past Medical History   Atrial fibrillation CHADS2-VASc score  - on warfarin; patient cannot tell when she is in AF  Hypertrophic cardiomyopathy    hypothyroidism Last TSH slightly elevated at 4.624; currently on levothyroxine 88 mcg    Allergies  Allergen Reactions   Wound Dressing Adhesive Rash    History of Present Illness    Paula [name] is a 87 y.o. female patient of Paula [name], in the office today because of elevated home blood pressure readings.   I saw her just over 2 years ago, at which time she was having problems with nighttime BP elevations.  She was waking up at night, usually within 90 minutes of going to bed, with flushing, pounding in her chest and tremors.  Blood pressure would jump to 175-190 systolic.  After drinking some water and listening to soothing music, she would go back to sleep.  We started her on prazosin 1 mg and within a few weeks the episodes were almost completely gone.  She most recently saw Paula. Bjorn [name] in December and was doing well, but having some elevated BP readings in the afternoons (130-160's).  He asked that she follow up with PharmD.  Patient presented today with her husband. She has been getting high BP readings in the evening ~150-160/70-75. Morning readings average in normal range ~120-130/80. She is in lot of back pain which worsens during evening. "Tramadol certainly helps her to sleep at night but does not helps with pain". Denies any dizzy spells or SOB, palpitation. Swelling went down a lot from furosemide. She takes all her BP in the morning except prazosin she takes before bedtime.     Current Medications: Diovan 160 mg daily furosemide 20 mg prn , diltiazem 360 mg qd, prazosin 1 mg QHS.     Social Hx: no tobacco, rare alcohol,Diet: does eat takeout regularly, restaurant up the street is a good place to get steamed vegetables   Exercise: activities of daily living     Intolerances: nkda  Adherence Assessment  Do you ever forget to take your medication? [] Yes [x] No  Do you ever skip doses due to side effects? [] Yes [x] No  Do you have trouble affording your medicines? [] Yes [x] No  Are you ever unable to pick up your medication due to transportation difficulties? [] Yes [x] No  Do you ever stop taking your medications because you don't believe they are helping? [] Yes [x] No  Do you check your weight daily? [] Yes [x] No    Accessory Clinical Findings    Lab Results  Component Value Date   CREATININE 1.00 05/28/2022   BUN 11 05/28/2022   NA 132 (L) 05/28/2022   K 4.1 05/28/2022   CL 94 (L) 05/28/2022   CO2 25 05/28/2022   Lab Results  Component Value Date   ALT 108 (H) 05/28/2022   AST 110 (H) 05/28/2022   ALKPHOS 115 05/28/2022   BILITOT 0.4 05/28/2022   No results found for: "HGBA1C"  Home Medications    Current Outpatient Medications  Medication Sig Dispense Refill   valsartan (DIOVAN) 160 MG tablet Take 1.5 tablets (240 mg total) by mouth daily. 150 tablet 3   acetaminophen (TYLENOL) 325 MG tablet every 4 (four) hours as needed for moderate pain or headache.  amiodarone (PACERONE) 200 MG tablet TAKE 1 TABLET EVERY DAY EXCEPT SUNDAY 90 tablet 3   Calcium Carb-Cholecalciferol 600-5 MG-MCG TABS daily at 6 (six) AM.     diltiazem (CARDIZEM CD) 300 MG 24 hr capsule TAKE 1 CAPSULE EVERY DAY 90 capsule 3   famotidine (PEPCID AC) 10 MG tablet as needed for heartburn or indigestion.     Ferrous Sulfate (IRON PO) Take 1 tablet by mouth every other day.     furosemide (LASIX) 20 MG tablet Take 1 tablet (20 mg total) by mouth daily as needed. 30 tablet 3   levothyroxine (SYNTHROID, LEVOTHROID) 88 MCG tablet Take 88 mcg by mouth daily before breakfast.      linaclotide (LINZESS) 290 MCG CAPS capsule Take 1 capsule (290 mcg total) by mouth daily before breakfast. 30 capsule 0   Multiple Vitamin (MULTIVITAMIN WITH MINERALS) TABS tablet Take 1 tablet by mouth daily.     polyethylene glycol powder (GLYCOLAX/MIRALAX) 17 GM/SCOOP powder Take 17 g by mouth 2 (two) times daily. 238 g 0   prazosin (MINIPRESS) 1 MG capsule TAKE 1 CAPSULE AT BEDTIME 90 capsule 3   rosuvastatin (CRESTOR) 5 MG tablet TAKE 1 TABLET EVERY DAY 90 tablet 2   sodium chloride (OCEAN) 0.65 % SOLN nasal spray Place 1 spray into both nostrils as needed for congestion.     warfarin (COUMADIN) 1 MG tablet Take 2 tablets daily or as directed by Coumadin Clinic. 65 tablet 1   No current facility-administered medications for this visit.     HYPERTENSION CONTROL Vitals:   05/30/22 1137 05/30/22 1151  BP: (!) 158/72 (!) 161/74    The patient's blood pressure is elevated above target today.  In order to address the patient's elevated BP: A current anti-hypertensive medication was adjusted today.     Assessment & Plan    Hypertension Assessment: BP is uncontrolled in office 1st BP 158/72 mmHg with HR 67 2nd measurement 161/74 heart rate 65 (goal <130/80) Takes current BP medications regularly and tolerates them well without any side effects At home morning BP at goal but evening BP is elevated; may be due to pain Denies SOB, palpitation, chest pain, headaches,or swelling   Plan:  Due to past hx of dizzy spells will consider slow titration of BP medications  Start taking valsartan 240 mg ( 1.5 tab of 160 mg) daily   Continue taking furosemide 20 mg prn , diltiazem 360 mg qam, prazosin 1 mg QHS Patient to keep record of BP readings with heart rate and report to [place] at the next visit Patient to see PharmD in 5 weeks for follow up  Follow up lab(s) :BMP on May 17 to assess renal function and K level post ARB titration  HYPERTENSION CONTROL Vitals:   05/30/22 1137 05/30/22 1151   BP: (!) 158/72 (!) 161/74    The patient's blood pressure is elevated above target today.  In order to address the patient's elevated BP: A current anti-hypertensive medication was adjusted today.      Paula [name], Pharm.D [facility] A Division of [facility] [address], [place], [place] [address]  Phone: [phone]; Fax: [phone]   [phone]

## 2022-05-30 NOTE — Assessment & Plan Note (Signed)
Assessment: BP is uncontrolled in office 1st BP 158/72 mmHg with HR 67 2nd measurement 161/74 heart rate 65 (goal <130/80) Takes current BP medications regularly and tolerates them well without any side effects At home morning BP at goal but evening BP is elevated; may be due to pain Denies SOB, palpitation, chest pain, headaches,or swelling   Plan:  Due to past hx of dizzy spells will consider slow titration of BP medications  Start taking valsartan 240 mg ( 1.5 tab of 160 mg) daily   Continue taking furosemide 20 mg prn , diltiazem 360 mg qam, prazosin 1 mg QHS Patient to keep record of BP readings with heart rate and report to [place] at the next visit Patient to see PharmD in 5 weeks for follow up  Follow up lab(s) :BMP on May 17 to assess renal function and K level post ARB titration

## 2022-05-31 ENCOUNTER — Telehealth: Payer: Self-pay | Admitting: Cardiology

## 2022-05-31 DIAGNOSIS — M4856XA Collapsed vertebra, not elsewhere classified, lumbar region, initial encounter for fracture: Secondary | ICD-10-CM | POA: Diagnosis not present

## 2022-05-31 DIAGNOSIS — I480 Paroxysmal atrial fibrillation: Secondary | ICD-10-CM

## 2022-05-31 MED ORDER — AMIODARONE HCL 100 MG PO TABS
100.0000 mg | ORAL_TABLET | Freq: Every day | ORAL | 3 refills | Status: DC
Start: 2022-05-31 — End: 2023-03-13

## 2022-05-31 NOTE — Telephone Encounter (Signed)
Patient is aware of lab results and provider recommendations to decrease amiodarone from 200mg  to 100mg  daily. She verbalized understanding.

## 2022-05-31 NOTE — Telephone Encounter (Signed)
Pt calling back for lab results.  °

## 2022-06-05 DIAGNOSIS — M47816 Spondylosis without myelopathy or radiculopathy, lumbar region: Secondary | ICD-10-CM | POA: Diagnosis not present

## 2022-06-05 DIAGNOSIS — M791 Myalgia, unspecified site: Secondary | ICD-10-CM | POA: Diagnosis not present

## 2022-06-06 ENCOUNTER — Other Ambulatory Visit: Payer: Self-pay

## 2022-06-06 DIAGNOSIS — I480 Paroxysmal atrial fibrillation: Secondary | ICD-10-CM

## 2022-06-06 NOTE — Telephone Encounter (Signed)
Paper Work Dropped Off: Blood Pressure Readings   Date: 06/06/2022  Location of paper:  Provider Mailbox

## 2022-06-07 LAB — COMPREHENSIVE METABOLIC PANEL
ALT: 100 IU/L — ABNORMAL HIGH (ref 0–32)
AST: 83 IU/L — ABNORMAL HIGH (ref 0–40)
Albumin/Globulin Ratio: 1.8 (ref 1.2–2.2)
Albumin: 4.1 g/dL (ref 3.7–4.7)
Alkaline Phosphatase: 112 IU/L (ref 44–121)
BUN/Creatinine Ratio: 10 — ABNORMAL LOW (ref 12–28)
BUN: 9 mg/dL (ref 8–27)
Bilirubin Total: 0.4 mg/dL (ref 0.0–1.2)
CO2: 23 mmol/L (ref 20–29)
Calcium: 9.4 mg/dL (ref 8.7–10.3)
Chloride: 92 mmol/L — ABNORMAL LOW (ref 96–106)
Creatinine, Ser: 0.9 mg/dL (ref 0.57–1.00)
Globulin, Total: 2.3 g/dL (ref 1.5–4.5)
Glucose: 85 mg/dL (ref 70–99)
Potassium: 4.3 mmol/L (ref 3.5–5.2)
Sodium: 131 mmol/L — ABNORMAL LOW (ref 134–144)
Total Protein: 6.4 g/dL (ref 6.0–8.5)
eGFR: 63 mL/min/{1.73_m2} (ref >59)

## 2022-06-10 ENCOUNTER — Ambulatory Visit (INDEPENDENT_AMBULATORY_CARE_PROVIDER_SITE_OTHER): Payer: Medicare HMO

## 2022-06-10 ENCOUNTER — Ambulatory Visit: Payer: Medicare HMO | Attending: Cardiology | Admitting: Cardiology

## 2022-06-10 ENCOUNTER — Encounter: Payer: Self-pay | Admitting: Cardiology

## 2022-06-10 VITALS — BP 150/56 | HR 64 | Ht 62.0 in | Wt 126.0 lb

## 2022-06-10 DIAGNOSIS — I340 Nonrheumatic mitral (valve) insufficiency: Secondary | ICD-10-CM

## 2022-06-10 DIAGNOSIS — I480 Paroxysmal atrial fibrillation: Secondary | ICD-10-CM

## 2022-06-10 DIAGNOSIS — I361 Nonrheumatic tricuspid (valve) insufficiency: Secondary | ICD-10-CM

## 2022-06-10 DIAGNOSIS — I422 Other hypertrophic cardiomyopathy: Secondary | ICD-10-CM | POA: Diagnosis not present

## 2022-06-10 DIAGNOSIS — Z5181 Encounter for therapeutic drug level monitoring: Secondary | ICD-10-CM | POA: Diagnosis not present

## 2022-06-10 DIAGNOSIS — I10 Essential (primary) hypertension: Secondary | ICD-10-CM | POA: Diagnosis not present

## 2022-06-10 DIAGNOSIS — I513 Intracardiac thrombosis, not elsewhere classified: Secondary | ICD-10-CM

## 2022-06-10 LAB — POCT INR: INR: 1.8 — AB (ref 2.0–3.0)

## 2022-06-10 NOTE — Patient Instructions (Signed)
Take 4 tablets today, then continue taking 2 tablets (2mg ) daily except for 3 tablets (3mg ) on Mondays and Fridays.   Recheck in 2 weeks.  Please call the Coumadin Clinic at [phone] with any questions

## 2022-06-10 NOTE — Telephone Encounter (Signed)
OV 5/20 with Dr. Bjorn Pippin

## 2022-06-10 NOTE — Progress Notes (Signed)
Cardiology Office Note:    Date:  06/10/2022   ID:  Ren…E Lye, DOB 1935/01/30, MRN [id number]  PCP:  Emilio Aspen, MD  Cardiologist:  Little Ishikawa, MD  Electrophysiologist:  Lanier Prude, MD   Referring MD: Emilio Aspen, *   No chief complaint on file.   History of Present Illness:    Paula [name] is a 87 y.o. female with a hx of paroxysmal atrial fibrillation, tachybrady syndrome status post PPM, hypertension, hypothyroidism who presents for follow-up.  She was referred by Alphonzo Severance, PA for evaluation of hypertrophic cardiomyopathy, initially seen on 06/14/2019.  Diagnosed with atrial fibrillation after presenting with palpitations to PCP office in April. EKG showed A. fib with RVR. Started on Eliquis given CHA2DS2-VASc score 5.  Also started on diltiazem for rate control. She was seen in AF clinic on 05/11/2019, she was noted to be in normal sinus rhythm. She was continued on Eliquis 5 mg twice daily and diltiazem 180 mg daily. TTE on 05/27/19 was concerning for apical variant hypertrophic cardiomyopathy, EF 60 to 65%, normal RV function, moderate elevation in RVSP (48 mmHg), mild mitral regurgitation, mild to moderate tricuspid regurgitation.  Cardiac MRI on 07/05/2019 showed LV apical hypertrophy measuring up to 15 mm, consistent with apical hypertrophic cardiomyopathy, small apical aneurysm, patchy apical LGE accounting for 1% of total myocardial mass, hyperdynamic LV systolic function, normal RV function, mild MR (regurgitant fraction 24%).  Zio patch x7 days on 07/14/2019 showed AF burden 47%, with episode lasting 3 days and average rate 118 bpm, no NSVT, frequent episodes of SVT, longest lasting 19 seconds.  She was admitted in June 2022 with near syncope, underwent PPM for tachybrady syndrome.  Echo 02/11/2022 showed EF 60 to 65%, severe asymmetric LV apical hypertrophy, normal RV function, severe left atrial enlargement, moderate to  severe mitral regurgitation, moderate to severe tricuspid regurgitation.  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  Since last clinic visit, she reports she is doing better.  States that her lower extremity edema has improved.  She denies any chest pain, dyspnea, or palpitations.  Reports lightheadedness has improved, denies any syncope.  Main issue continues to be back pain, though has improved.  She brought her home BP log, has been 120s to 150s over 50s to 70s.  Denies any bleeding issues on warfarin.  Wt Readings from Last 3 Encounters:  06/10/22 126 lb (57.2 kg)  05/23/22 128 lb 9.6 oz (58.3 kg)  04/05/22 133 lb (60.3 kg)     Past Medical History:  Diagnosis Date   CHF (congestive heart failure) (HCC)    Hypothyroidism    Irregular heart rate    Thyroid disease     Past Surgical History:  Procedure Laterality Date   PACEMAKER IMPLANT N/A 07/17/2020   Procedure: PACEMAKER IMPLANT;  Surgeon: Marinus Maw, MD;  Location: [facility];  Service: Cardiovascular;  Laterality: N/A;    Current Medications: Current Meds  Medication Sig   acetaminophen (TYLENOL) 325 MG tablet every 4 (four) hours as needed for moderate pain or headache.   amiodarone (PACERONE) 100 MG tablet Take 1 tablet (100 mg total) by mouth daily.   Calcium Carb-Cholecalciferol 600-5 MG-MCG TABS daily  at 6 (six) AM.   diltiazem (CARDIZEM CD) 300 MG 24 hr capsule TAKE 1 CAPSULE EVERY DAY   famotidine (PEPCID AC) 10 MG tablet as needed for heartburn or indigestion.   Ferrous Sulfate (IRON PO) Take 1 tablet by mouth every other day.   furosemide (LASIX) 20 MG tablet Take 1 tablet (20 mg total) by  mouth daily as needed.   levothyroxine (SYNTHROID, LEVOTHROID) 88 MCG tablet Take 88 mcg by mouth daily before breakfast.   linaclotide (LINZESS) 290 MCG CAPS capsule Take 1 capsule (290 mcg total) by mouth daily before breakfast.   Multiple Vitamin (MULTIVITAMIN WITH MINERALS) TABS tablet Take 1 tablet by mouth daily.   polyethylene glycol powder (GLYCOLAX/MIRALAX) 17 GM/SCOOP powder Take 17 g by mouth 2 (two) times daily.   prazosin (MINIPRESS) 1 MG capsule TAKE 1 CAPSULE AT BEDTIME   rosuvastatin (CRESTOR) 5 MG tablet TAKE 1 TABLET EVERY DAY   sodium chloride (OCEAN) 0.65 % SOLN nasal spray Place 1 spray into both nostrils as needed for congestion.   valsartan (DIOVAN) 160 MG tablet Take 1.5 tablets (240 mg total) by mouth daily.   warfarin (COUMADIN) 1 MG tablet Take 2 tablets daily or as directed by Coumadin Clinic.     Allergies:   Wound dressing adhesive   Social History   Socioeconomic History   Marital status: Married    Spouse name: Not on file   Number of children: Not on file   Years of education: Not on file   Highest education level: Not on file  Occupational History   Not on file  Tobacco Use   Smoking status: Never   Smokeless tobacco: Never  Vaping Use   Vaping Use: Never used  Substance and Sexual Activity   Alcohol use: Yes    Alcohol/week: 1.0 standard drink of alcohol    Types: 1 Glasses of wine per week   Drug use: No   Sexual activity: Not Currently    Birth control/protection: Post-menopausal  Other Topics Concern   Not on file  Social History Narrative   Not on file   Social Determinants of Health   Financial Resource Strain: Not on file  Food Insecurity: Not on file  Transportation Needs: Not on file  Physical Activity: Not on file  Stress: Not on file  Social Connections: Not on file     Family History: The patient's family history includes Breast cancer (age of onset: 90) in her sister.  ROS:   Please see the history of present  illness.     All other systems reviewed and are negative.  EKGs/Labs/Other Studies Reviewed:    The following studies were reviewed today:  Monitor 05/01/2020: No significant abnormalities   Patch Wear Time:  2 days and 23 hours (2022-04-01T16:38:40-0400 to 2022-04-04T16:10:58-0400)   Patient had a min HR of 35 bpm, max HR of 119 bpm, and avg HR of 61 bpm. Predominant underlying rhythm was Sinus Rhythm. 2 Supraventricular Tachycardia runs occurred, the run with the fastest interval lasting 4 beats with a max rate of 119 bpm, the longest lasting 4 beats with an avg rate of 98 bpm. Isolated SVEs were rare (<1.0%), SVE Couplets were rare (<1.0%), and SVE Triplets were rare (<1.0%). Isolated VEs were rare (<1.0%), and no VE Couplets or VE Triplets were present.  No patient triggered events  Monitor 07/14/2019: Episode of atrial fibrillation lasting 3 days 6 hours, average rate 118 bpm. AF burden 47%. 141 episodes of SVT, longest lasting 18.5  seconds.   7 days of data recorded on Zio monitor. Patient had a min HR of 55 bpm, max HR of 207 bpm, and avg HR of 95 bpm. Predominant underlying rhythm was Sinus Rhythm. No VT, high degree block, or pauses noted.  141 episodes of SVT, longest lasting 18.5 seconds.  Episode of atrial fibrillation lasting 3 days 6 hours, average rate 118 bpm.  AF burden 47%.   Isolated atrial and ventricular ectopy was rare (<1%). There were 0 triggered events.  CMR 07/05/2019: 1. LV hypertrophy at apex measuring up to 15mm, consistent with apical hypertrophic cardiomyopathy   2.  Small apical aneurysm   3. Patchy LGE at apex, consistent with apical HCM. LGE accounts for 1% of total myocardial mass   4.  Normal LV size with hyperdynamic systolic function (EF 70%)   5.  Normal RV size and systolic function (EF 58%)   6. Mild mitral regurgitation (regurgitant volume 16cc, regurgitant fraction 24%)  Echo 05/27/2019: 1. Apical variant hypertrophic cardiomyopathy with no  evidence of apical  thrombus. Left ventricular ejection fraction, by estimation, is 60 to 65%.  The left ventricle has normal function. The left ventricle demonstrates  regional wall motion  abnormalities (see scoring diagram/findings for description). There is  severe left ventricular hypertrophy of the apical segment. Left  ventricular diastolic parameters are consistent with Grade I diastolic  dysfunction (impaired relaxation).   2. Right ventricular systolic function is normal. The right ventricular  size is normal. There is moderately elevated pulmonary artery systolic  pressure. The estimated right ventricular systolic pressure is 47.9 mmHg.   3. Left atrial size was mildly dilated.   4. The mitral valve is normal in structure. Mild mitral valve  regurgitation. No evidence of mitral stenosis.   5. Tricuspid valve regurgitation is mild to moderate.   6. Possible fusion of right and non coronary cusp. The aortic valve is  normal in structure. Aortic valve regurgitation is not visualized. Mild to  moderate aortic valve sclerosis/calcification is present, without any  evidence of aortic stenosis.   7. The inferior vena cava is normal in size with greater than 50%  respiratory variability, suggesting right atrial pressure of 3 mmHg.  Exercise Myoview 07/12/2016: Nuclear stress EF: 64%. Blood pressure demonstrated a normal response to exercise. Horizontal ST segment depression ST segment depression was noted during stress in the III, aVF, V4, V5 and V6 leads, and returning to baseline after 1-5 minutes of recovery. Findings are nonspecific given baseline T wave inversions. The study is normal. This is a low risk study. The left ventricular ejection fraction is normal (55-65%).   EKG:   01/08/2022: Atrial paced, rate 60, poor R wave progression, nonspecific T wave flattening 07/04/2021: Atrial paced, rate 60, Q waves in V1/2, nonspecific T wave flattening 12/27/20: Atrial paced, rate  60, Q waves in V1/2, no ST abnormalities 07/10/2020: EKG is not ordered today. 04/14/2020: sinus rhythm, rate 74, LVH with repolarization abnormalities  Recent Labs: 03/19/2022: TSH 3.074 05/28/2022: BNP 154.5; Hemoglobin 11.8; Magnesium 2.2; Platelets 379 06/06/2022: ALT 100; BUN 9; Creatinine, Ser 0.90; Potassium 4.3; Sodium 131  Recent Lipid Panel    Component Value Date/Time   CHOL 174 03/20/2021 0809   TRIG 79 03/20/2021 0809   HDL 93 03/20/2021 0809   CHOLHDL 1.9 03/20/2021 0809   LDLCALC 66 03/20/2021 0809    Physical Exam:    VS:  BP (!) 150/56 (BP Location: Right Arm, Patient Position: Sitting, Cuff Size: Normal)  Pulse 64   Ht 5\' 2"  (1.575 m)   Wt 126 lb (57.2 kg)   SpO2 97%   BMI 23.05 kg/m     Wt Readings from Last 3 Encounters:  06/10/22 126 lb (57.2 kg)  05/23/22 128 lb 9.6 oz (58.3 kg)  04/05/22 133 lb (60.3 kg)     GEN: in no acute distress HEENT: Normal NECK: No JVD; No carotid bruits CARDIAC: RRR, 2/6 systolic murmur RESPIRATORY:  Clear to auscultation without rales, wheezing or rhonchi  ABDOMEN: Soft, non-tender, non-distended MUSCULOSKELETAL:  No edema; No deformity  SKIN: Warm and dry NEUROLOGIC:  Alert and oriented x 3 PSYCHIATRIC:  Normal affect   ASSESSMENT:    1. Hypertrophic cardiomyopathy (HCC)   2. LV (left ventricular) mural thrombus   3. Nonrheumatic mitral valve regurgitation   4. Nonrheumatic tricuspid valve regurgitation   5. Paroxysmal atrial fibrillation (HCC)   6. Hypertension, unspecified type      PLAN:    Hypertrophic cardiomyopathy: cardiac MRI on 07/05/2019 showed LV apical hypertrophy measuring up to 15 mm, consistent with apical hypertrophic cardiomyopathy, small apical aneurysm, patchy apical LGE accounting for 1% of total myocardial mass.  No NSVT on Zio patch x 7 days.  Echo 02/11/2022 showed EF 60 to 65%, severe asymmetric LV apical hypertrophy, normal RV function, severe left atrial enlargement, moderate to severe  mitral regurgitation, moderate to severe tricuspid regurgitation.  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  This was developed despite being on Eliquis.  Recommended switching to warfarin -Follows in Coumadin clinic for INR management  Mitral regurgitation/tricuspid regurgitation: Moderate to severe MR and moderate TR on CMR 03/2022.  Will monitor -Takes Lasix 20 mg daily as needed.  Monitor daily weights and take as needed if gains more than 3 pounds in 1 day or 5 pounds in 1 week  Paroxysmal atrial fibrillation: Diagnosed 04/2019 after presenting to PCPs office with palpitations. CHA2DS2-VASc score 5.  Zio patch x7 days on 07/14/2019 showed AF burden 47%, with episode lasting 3 days and average rate 118 bpm.  She was admitted in June 2022 with near syncope, underwent PPM for tachybrady syndrome. -Continue Eliquis 5 mg twice daily -Was on amiodarone 200 mg daily.  Transaminitis noted on labs, dose reduced to 100 mg daily -Continue diltiazem 300 mg daily  SVT: Frequent episodes on ZIO monitoring, longest lasting 19 seconds.  Continue diltiazem  Hypertension: On valsartan 240 mg mg daily, prazosin 1 mg nightly, diltiazem 300 mg daily.  BP appears controlled  Hyperlipidemia: On rosuvastatin 5 mg daily, LDL 66 on 03/20/2021  Transaminitis: Mild elevation in liver enzymes noted.  Amiodarone dose decreased to 100 mg daily.  Continue to monitor, recommend PCP follow-up for further evaluation   RTC in  6 weeks  Medication Adjustments/Labs and Tests Ordered: Current medicines are reviewed at length with the patient today.  Concerns regarding medicines are outlined above.  No orders of the defined types were placed in this encounter.   No orders of the defined types were placed in this encounter.     Patient Instructions  Medication Instructions:  Your physician recommends that you continue on your current medications as directed. Please refer to the Current Medication list given to you today.  *If you need a refill on your cardiac medications before your next appointment, please call your pharmacy*  Follow-Up: At [facility], you and your health needs are our priority.  As part of our continuing mission to provide you with exceptional heart care, we have created designated Provider Care Teams.  These Care Teams include your primary Cardiologist (physician) and Advanced Practice Providers (APPs -  Physician Assistants and Nurse Practitioners) who all work together to provide you with the care you need, when you need it.  We recommend signing up for the patient portal called "MyChart".  Sign up information is provided on this After Visit Summary.  MyChart is used to connect with patients for Virtual Visits (Telemedicine).  Patients are able to view lab/test results, encounter notes, upcoming appointments, etc.  Non-urgent messages can be sent to your provider as well.   To learn more about what you can do with MyChart, go to [email or web address].    Your next appointment:   3 month(s)  Provider:   Little Ishikawa, MD          Signed, Little Ishikawa, MD  06/10/2022 1:35 PM    [facility]

## 2022-06-10 NOTE — Patient Instructions (Signed)
Medication Instructions:  Your physician recommends that you continue on your current medications as directed. Please refer to the Current Medication list given to you today.  *If you need a refill on your cardiac medications before your next appointment, please call your pharmacy*  Follow-Up: At [facility], you and your health needs are our priority.  As part of our continuing mission to provide you with exceptional heart care, we have created designated Provider Care Teams.  These Care Teams include your primary Cardiologist (physician) and Advanced Practice Providers (APPs -  Physician Assistants and Nurse Practitioners) who all work together to provide you with the care you need, when you need it.  We recommend signing up for the patient portal called "MyChart".  Sign up information is provided on this After Visit Summary.  MyChart is used to connect with patients for Virtual Visits (Telemedicine).  Patients are able to view lab/test results, encounter notes, upcoming appointments, etc.  Non-urgent messages can be sent to your provider as well.   To learn more about what you can do with MyChart, go to [email or web address].    Your next appointment:   3 month(s)  Provider:   Little Ishikawa, MD

## 2022-06-20 ENCOUNTER — Other Ambulatory Visit: Payer: Self-pay

## 2022-06-20 DIAGNOSIS — I480 Paroxysmal atrial fibrillation: Secondary | ICD-10-CM

## 2022-06-20 MED ORDER — WARFARIN SODIUM 1 MG PO TABS
ORAL_TABLET | ORAL | 0 refills | Status: DC
Start: 2022-06-20 — End: 2022-06-26

## 2022-06-20 NOTE — Telephone Encounter (Signed)
Prescription refill request received for warfarin Lov: 06/10/22 Paula [name])  Next INR check: 06/26/22  Warfarin tablet strength: 1mg   Appropriate dose. Refill sent.

## 2022-06-26 ENCOUNTER — Other Ambulatory Visit: Payer: Self-pay | Admitting: *Deleted

## 2022-06-26 ENCOUNTER — Ambulatory Visit: Payer: Medicare HMO | Attending: Cardiovascular Disease | Admitting: *Deleted

## 2022-06-26 DIAGNOSIS — I513 Intracardiac thrombosis, not elsewhere classified: Secondary | ICD-10-CM | POA: Diagnosis not present

## 2022-06-26 DIAGNOSIS — I480 Paroxysmal atrial fibrillation: Secondary | ICD-10-CM | POA: Diagnosis not present

## 2022-06-26 DIAGNOSIS — Z5181 Encounter for therapeutic drug level monitoring: Secondary | ICD-10-CM | POA: Diagnosis not present

## 2022-06-26 LAB — POCT INR: INR: 2.1 (ref 2.0–3.0)

## 2022-06-26 MED ORDER — WARFARIN SODIUM 1 MG PO TABS
ORAL_TABLET | ORAL | 1 refills | Status: DC
Start: 2022-06-26 — End: 2022-10-03

## 2022-06-26 NOTE — Patient Instructions (Addendum)
Description   Continue taking 2 tablets (2mg ) daily except for 3 tablets (3mg ) on Mondays and Fridays.   Recheck in 3 weeks.  Please call the Coumadin Clinic at [phone] with any questions

## 2022-06-26 NOTE — Telephone Encounter (Signed)
Pt requested warfarin refill be sent to [facility] 1mg  Afib, Thrombus Last INR 06/26/22 Last OV 06/10/22

## 2022-07-03 ENCOUNTER — Other Ambulatory Visit: Payer: Self-pay

## 2022-07-03 MED ORDER — FUROSEMIDE 20 MG PO TABS: 20.0000 mg | ORAL_TABLET | Freq: Every day | ORAL | 3 refills | Status: DC | PRN

## 2022-07-04 ENCOUNTER — Encounter: Payer: Self-pay | Admitting: Pharmacist

## 2022-07-04 ENCOUNTER — Ambulatory Visit: Payer: Medicare HMO | Attending: Internal Medicine | Admitting: Pharmacist

## 2022-07-04 VITALS — BP 160/71 | HR 61 | Wt 126.6 lb

## 2022-07-04 DIAGNOSIS — I700 Atherosclerosis of aorta: Secondary | ICD-10-CM | POA: Insufficient documentation

## 2022-07-04 DIAGNOSIS — I499 Cardiac arrhythmia, unspecified: Secondary | ICD-10-CM | POA: Insufficient documentation

## 2022-07-04 DIAGNOSIS — E039 Hypothyroidism, unspecified: Secondary | ICD-10-CM | POA: Insufficient documentation

## 2022-07-04 DIAGNOSIS — I10 Essential (primary) hypertension: Secondary | ICD-10-CM

## 2022-07-04 DIAGNOSIS — E7800 Pure hypercholesterolemia, unspecified: Secondary | ICD-10-CM | POA: Insufficient documentation

## 2022-07-04 DIAGNOSIS — I422 Other hypertrophic cardiomyopathy: Secondary | ICD-10-CM | POA: Insufficient documentation

## 2022-07-04 DIAGNOSIS — J309 Allergic rhinitis, unspecified: Secondary | ICD-10-CM | POA: Insufficient documentation

## 2022-07-04 DIAGNOSIS — M8580 Other specified disorders of bone density and structure, unspecified site: Secondary | ICD-10-CM | POA: Insufficient documentation

## 2022-07-04 NOTE — Patient Instructions (Addendum)
It was nice seeing you today  We would like your blood pressure to be less than 130/80  Please continue your: Diltiazem 320mg  daily Valsartan 1.5 tablets daily Prazosin 1mg  at night Furosmide 20mg  as needed  Take your blood pressure twice daily for a week and we will discuss next week at your INR appointment  Please let us know if you have any questions or concerns  Laural Golden, PharmD, BCACP, CDCES, CPP [address] [place], [place], [address] Phone: [phone], Fax: [phone]

## 2022-07-04 NOTE — Progress Notes (Signed)
Patient ID: Paula [name]                 DOB: Jul 05, 1935                      MRN: [id number]     HPI: Paula [name] is a 87 y.o. female referred by Dr. Bjorn Pippin to HTN clinic. PMH is significant for HOCM, a fib, LV thrombus, and HLD.  Patient presents today with husband. Has 7 year anniversary tomorrow and last night they went out for Asian food. Had teriyaki sauce and she knows it was salty. When she got home her feet were swollen and she took a furosemide. Better this morning.  Has not been checking blood pressure at home regularly. Believes it has been better controlled since increasing valsartan to 240mg  daily. Remains also on diltiazem and prazosin. She believes 5 days ago it was in 130s?  Has one cup of half decaf coffee in the mornings.  Contributing factor was thought to be back pain however she reports feeling much better recently.   Current HTN meds:  Furosemide 20mg  daily prn Diltiazem 320mg  daily Valsartan 240mg  daily Prazosin 1mg  daily  BP goal: <`130/80   Wt Readings from Last 3 Encounters:  07/04/22 126 lb 9.6 oz (57.4 kg)  06/10/22 126 lb (57.2 kg)  05/23/22 128 lb 9.6 oz (58.3 kg)   BP Readings from Last 3 Encounters:  07/04/22 (!) 160/71  06/10/22 (!) 150/56  05/30/22 (!) 161/74   Pulse Readings from Last 3 Encounters:  07/04/22 61  06/10/22 64  05/30/22 67    Renal function: CrCl cannot be calculated (Patient's most recent lab result is older than the maximum 21 days allowed.).  Past Medical History:  Diagnosis Date   CHF (congestive heart failure) (HCC)    Hypothyroidism    Irregular heart rate    Thyroid disease     Current Outpatient Medications on File Prior to Visit  Medication Sig Dispense Refill   traMADol (ULTRAM) 50 MG tablet Take 50 mg by mouth at bedtime.     acetaminophen (TYLENOL) 325 MG tablet every 4 (four) hours as needed for moderate pain or headache.     amiodarone (PACERONE) 100 MG tablet Take 1 tablet  (100 mg total) by mouth daily. 90 tablet 3   Calcium Carb-Cholecalciferol 600-5 MG-MCG TABS daily at 6 (six) AM.     diltiazem (CARDIZEM CD) 300 MG 24 hr capsule TAKE 1 CAPSULE EVERY DAY 90 capsule 3   famotidine (PEPCID AC) 10 MG tablet as needed for heartburn or indigestion.     Ferrous Sulfate (IRON PO) Take 1 tablet by mouth every other day.     furosemide (LASIX) 20 MG tablet Take 1 tablet (20 mg total) by mouth daily as needed. 90 tablet 3   levothyroxine (SYNTHROID, LEVOTHROID) 88 MCG tablet Take 88 mcg by mouth daily before breakfast.     linaclotide (LINZESS) 290 MCG CAPS capsule Take 1 capsule (290 mcg total) by mouth daily before breakfast. 30 capsule 0   Multiple Vitamin (MULTIVITAMIN WITH MINERALS) TABS tablet Take 1 tablet by mouth daily.     polyethylene glycol powder (GLYCOLAX/MIRALAX) 17 GM/SCOOP powder Take 17 g by mouth 2 (two) times daily. 238 g 0   prazosin (MINIPRESS) 1 MG capsule TAKE 1 CAPSULE AT BEDTIME 90 capsule 3   rosuvastatin (CRESTOR) 5 MG tablet TAKE 1 TABLET EVERY DAY 90 tablet 2   sodium chloride (OCEAN) 0.65 %  SOLN nasal spray Place 1 spray into both nostrils as needed for congestion.     valsartan (DIOVAN) 160 MG tablet Take 1.5 tablets (240 mg total) by mouth daily. 150 tablet 3   warfarin (COUMADIN) 1 MG tablet Take 2 tablets to 3 tablets by mouth daily or as directed by Coumadin Clinic. 180 tablet 1   No current facility-administered medications on file prior to visit.    Allergies  Allergen Reactions   Wound Dressing Adhesive Rash     Assessment/Plan:  1. Hypertension -  HYPERTENSION CONTROL Vitals:   07/04/22 1144 07/04/22 1146  BP: (!) 156/74 (!) 160/71    The patient's blood pressure is elevated above target today.  In order to address the patient's elevated BP: Blood pressure will be monitored at home to determine if medication changes need to be made.; A referral to the [facility] will be placed.    Patient BP in room  156/75 which is similar to previous in office readings despite dose increase on valsartan. Unclear what home readings are since patient has not been checking. Hesitant to increase valsartan to 320mg  without home readings since patient is a fall risk and has a history of fractures. Recommend patient begin checking BP twice daily and recording results and bringing to next INR appointment on 6/19 for review. Decision will then be made. Patient agreeable to plan.  Continue valsartan 240mg  daily Continue prazosin 1mg  nightly Continue diltiazem 320mg  daily Continue furosemide 20mg  prn Follow up on 6/19  Laural Golden, PharmD, BCACP, CDCES, CPP [address], [address] [place], [place], [address] Phone: [phone], Fax: [phone]

## 2022-07-08 ENCOUNTER — Ambulatory Visit: Payer: Medicare HMO | Admitting: Cardiology

## 2022-07-08 DIAGNOSIS — H524 Presbyopia: Secondary | ICD-10-CM | POA: Diagnosis not present

## 2022-07-08 DIAGNOSIS — H35363 Drusen (degenerative) of macula, bilateral: Secondary | ICD-10-CM | POA: Diagnosis not present

## 2022-07-08 DIAGNOSIS — H18593 Other hereditary corneal dystrophies, bilateral: Secondary | ICD-10-CM | POA: Diagnosis not present

## 2022-07-08 DIAGNOSIS — H52223 Regular astigmatism, bilateral: Secondary | ICD-10-CM | POA: Diagnosis not present

## 2022-07-09 ENCOUNTER — Other Ambulatory Visit: Payer: Self-pay | Admitting: Cardiology

## 2022-07-17 ENCOUNTER — Telehealth: Payer: Self-pay | Admitting: Pharmacist

## 2022-07-17 ENCOUNTER — Ambulatory Visit: Payer: Medicare HMO | Attending: Cardiology | Admitting: *Deleted

## 2022-07-17 DIAGNOSIS — I480 Paroxysmal atrial fibrillation: Secondary | ICD-10-CM | POA: Diagnosis not present

## 2022-07-17 DIAGNOSIS — Z5181 Encounter for therapeutic drug level monitoring: Secondary | ICD-10-CM

## 2022-07-17 DIAGNOSIS — I513 Intracardiac thrombosis, not elsewhere classified: Secondary | ICD-10-CM

## 2022-07-17 LAB — POCT INR: INR: 1.6 — AB (ref 2.0–3.0)

## 2022-07-17 NOTE — Telephone Encounter (Signed)
Patient brought in BP readings from last week:  AM readings well controlled. Ranging from 115/52 to 140/64. BP taken between 9:00am and 10:30am.  Evening readings however more elevated ranging from 125/60 to 157/75. BP readings taken between 4:00pm and 8:00pm.    Takes her valsartan around 3:00-4:00pm.  Bedtime is around 10:30pm. Advised to continue to monitor at home and try to take it occasionally before bed to see valsartan effect. Patient voices understanding. Will look at log again at next INR appointment.

## 2022-07-17 NOTE — Patient Instructions (Addendum)
Description   Today take 3 tablets (3mg ) then continue taking 2 tablets (2mg ) daily except for 3 tablets (3mg ) on Mondays and Fridays.   Recheck in 2 weeks.  Please call the Coumadin Clinic at [phone] with any questions

## 2022-07-18 ENCOUNTER — Ambulatory Visit (INDEPENDENT_AMBULATORY_CARE_PROVIDER_SITE_OTHER): Payer: Medicare HMO

## 2022-07-18 DIAGNOSIS — I422 Other hypertrophic cardiomyopathy: Secondary | ICD-10-CM

## 2022-07-23 LAB — CUP PACEART REMOTE DEVICE CHECK
Battery Remaining Longevity: 98 mo
Battery Remaining Percentage: 85 %
Battery Voltage: 3.02 V
Brady Statistic AP VP Percent: 1 %
Brady Statistic AP VS Percent: 52 %
Brady Statistic AS VP Percent: 1 %
Brady Statistic AS VS Percent: 48 %
Brady Statistic RA Percent Paced: 52 %
Brady Statistic RV Percent Paced: 1 %
Date Time Interrogation Session: 20240628163242
Implantable Lead Connection Status: 753985
Implantable Lead Connection Status: 753985
Implantable Lead Implant Date: 20220627
Implantable Lead Implant Date: 20220627
Implantable Lead Location: 753859
Implantable Lead Location: 753860
Implantable Pulse Generator Implant Date: 20220627
Lead Channel Impedance Value: 390 Ohm
Lead Channel Impedance Value: 580 Ohm
Lead Channel Pacing Threshold Amplitude: 0.75 V
Lead Channel Pacing Threshold Amplitude: 1 V
Lead Channel Pacing Threshold Pulse Width: 0.5 ms
Lead Channel Pacing Threshold Pulse Width: 0.5 ms
Lead Channel Sensing Intrinsic Amplitude: 11.1 mV
Lead Channel Sensing Intrinsic Amplitude: 3.2 mV
Lead Channel Setting Pacing Amplitude: 2.5 V
Lead Channel Setting Pacing Amplitude: 2.5 V
Lead Channel Setting Pacing Pulse Width: 0.5 ms
Lead Channel Setting Sensing Sensitivity: 2 mV
Pulse Gen Model: 2272
Pulse Gen Serial Number: 3933448

## 2022-07-31 ENCOUNTER — Ambulatory Visit: Payer: Medicare HMO | Attending: Cardiology | Admitting: *Deleted

## 2022-07-31 DIAGNOSIS — I480 Paroxysmal atrial fibrillation: Secondary | ICD-10-CM

## 2022-07-31 DIAGNOSIS — I513 Intracardiac thrombosis, not elsewhere classified: Secondary | ICD-10-CM

## 2022-07-31 DIAGNOSIS — Z5181 Encounter for therapeutic drug level monitoring: Secondary | ICD-10-CM

## 2022-07-31 LAB — POCT INR: POC INR: 1.5

## 2022-07-31 NOTE — Patient Instructions (Addendum)
Description   Take 3 tablets (3mg ) of warfarin today and tomorrow then START taking 2 tablets (2mg ) daily except for 3 tablets (3mg ) on Mondays, Wednesdays and Fridays.   Recheck in 2 weeks.  Please call the Coumadin Clinic at [phone] with any questions

## 2022-08-01 NOTE — Progress Notes (Signed)
Remote pacemaker transmission.   

## 2022-08-07 DIAGNOSIS — M4856XA Collapsed vertebra, not elsewhere classified, lumbar region, initial encounter for fracture: Secondary | ICD-10-CM | POA: Diagnosis not present

## 2022-08-07 DIAGNOSIS — M7918 Myalgia, other site: Secondary | ICD-10-CM | POA: Diagnosis not present

## 2022-08-07 DIAGNOSIS — M47816 Spondylosis without myelopathy or radiculopathy, lumbar region: Secondary | ICD-10-CM | POA: Diagnosis not present

## 2022-08-12 ENCOUNTER — Ambulatory Visit: Payer: Medicare HMO | Attending: Cardiology | Admitting: *Deleted

## 2022-08-12 DIAGNOSIS — N1831 Chronic kidney disease, stage 3a: Secondary | ICD-10-CM | POA: Diagnosis not present

## 2022-08-12 DIAGNOSIS — D509 Iron deficiency anemia, unspecified: Secondary | ICD-10-CM | POA: Diagnosis not present

## 2022-08-12 DIAGNOSIS — D6869 Other thrombophilia: Secondary | ICD-10-CM | POA: Diagnosis not present

## 2022-08-12 DIAGNOSIS — I513 Intracardiac thrombosis, not elsewhere classified: Secondary | ICD-10-CM

## 2022-08-12 DIAGNOSIS — Z5181 Encounter for therapeutic drug level monitoring: Secondary | ICD-10-CM | POA: Diagnosis not present

## 2022-08-12 DIAGNOSIS — I480 Paroxysmal atrial fibrillation: Secondary | ICD-10-CM

## 2022-08-12 DIAGNOSIS — E7800 Pure hypercholesterolemia, unspecified: Secondary | ICD-10-CM | POA: Diagnosis not present

## 2022-08-12 DIAGNOSIS — Z23 Encounter for immunization: Secondary | ICD-10-CM | POA: Diagnosis not present

## 2022-08-12 DIAGNOSIS — Z0000 Encounter for general adult medical examination without abnormal findings: Secondary | ICD-10-CM | POA: Diagnosis not present

## 2022-08-12 DIAGNOSIS — I422 Other hypertrophic cardiomyopathy: Secondary | ICD-10-CM | POA: Diagnosis not present

## 2022-08-12 DIAGNOSIS — I700 Atherosclerosis of aorta: Secondary | ICD-10-CM | POA: Diagnosis not present

## 2022-08-12 DIAGNOSIS — Z1331 Encounter for screening for depression: Secondary | ICD-10-CM | POA: Diagnosis not present

## 2022-08-12 DIAGNOSIS — I10 Essential (primary) hypertension: Secondary | ICD-10-CM | POA: Diagnosis not present

## 2022-08-12 DIAGNOSIS — R748 Abnormal levels of other serum enzymes: Secondary | ICD-10-CM | POA: Diagnosis not present

## 2022-08-12 DIAGNOSIS — E039 Hypothyroidism, unspecified: Secondary | ICD-10-CM | POA: Diagnosis not present

## 2022-08-12 LAB — POCT INR: INR: 1.5 — AB (ref 2.0–3.0)

## 2022-08-12 NOTE — Patient Instructions (Addendum)
Description   Take 4 tablets (4mg ) of warfarin today and tomorrow take 3 tablets (3mg ) then START taking 3 tablets (3mg ) daily except for 2 tablets (2mg ) on Sundays, Tuesdays, and Thursdays. Recheck in 2 weeks.  Please call the Coumadin Clinic at [phone] with any questions

## 2022-08-15 ENCOUNTER — Ambulatory Visit: Payer: Medicare HMO | Admitting: Cardiology

## 2022-08-19 ENCOUNTER — Encounter: Payer: Self-pay | Admitting: Internal Medicine

## 2022-08-19 ENCOUNTER — Other Ambulatory Visit: Payer: Self-pay | Admitting: Internal Medicine

## 2022-08-19 DIAGNOSIS — Z0000 Encounter for general adult medical examination without abnormal findings: Secondary | ICD-10-CM

## 2022-08-21 ENCOUNTER — Other Ambulatory Visit: Payer: Self-pay | Admitting: Internal Medicine

## 2022-08-21 DIAGNOSIS — M8580 Other specified disorders of bone density and structure, unspecified site: Secondary | ICD-10-CM

## 2022-08-27 DIAGNOSIS — Z1283 Encounter for screening for malignant neoplasm of skin: Secondary | ICD-10-CM | POA: Diagnosis not present

## 2022-08-27 DIAGNOSIS — Z85828 Personal history of other malignant neoplasm of skin: Secondary | ICD-10-CM | POA: Diagnosis not present

## 2022-08-27 DIAGNOSIS — D485 Neoplasm of uncertain behavior of skin: Secondary | ICD-10-CM | POA: Diagnosis not present

## 2022-08-27 DIAGNOSIS — L570 Actinic keratosis: Secondary | ICD-10-CM | POA: Diagnosis not present

## 2022-08-28 ENCOUNTER — Ambulatory Visit: Payer: Medicare HMO | Attending: Cardiology | Admitting: *Deleted

## 2022-08-28 DIAGNOSIS — Z5181 Encounter for therapeutic drug level monitoring: Secondary | ICD-10-CM | POA: Diagnosis not present

## 2022-08-28 DIAGNOSIS — I480 Paroxysmal atrial fibrillation: Secondary | ICD-10-CM

## 2022-08-28 DIAGNOSIS — I513 Intracardiac thrombosis, not elsewhere classified: Secondary | ICD-10-CM

## 2022-08-28 DIAGNOSIS — R748 Abnormal levels of other serum enzymes: Secondary | ICD-10-CM | POA: Diagnosis not present

## 2022-08-28 LAB — POCT INR: INR: 2.2 (ref 2.0–3.0)

## 2022-08-28 NOTE — Patient Instructions (Addendum)
Description   Continue taking warfarin 3 tablets (3mg ) daily except for 2 tablets (2mg ) on Sundays, Tuesdays, and Thursdays. Recheck in 2 weeks with MD appt.  Please call the Coumadin Clinic at [phone] with any questions

## 2022-09-04 DIAGNOSIS — R7989 Other specified abnormal findings of blood chemistry: Secondary | ICD-10-CM | POA: Diagnosis not present

## 2022-09-04 DIAGNOSIS — K8020 Calculus of gallbladder without cholecystitis without obstruction: Secondary | ICD-10-CM | POA: Diagnosis not present

## 2022-09-04 DIAGNOSIS — R748 Abnormal levels of other serum enzymes: Secondary | ICD-10-CM | POA: Diagnosis not present

## 2022-09-07 ENCOUNTER — Other Ambulatory Visit: Payer: Self-pay | Admitting: Cardiology

## 2022-09-09 ENCOUNTER — Encounter: Payer: Self-pay | Admitting: Internal Medicine

## 2022-09-12 ENCOUNTER — Ambulatory Visit: Payer: Medicare HMO | Attending: Cardiology | Admitting: Cardiology

## 2022-09-12 ENCOUNTER — Ambulatory Visit (INDEPENDENT_AMBULATORY_CARE_PROVIDER_SITE_OTHER): Payer: Medicare HMO

## 2022-09-12 VITALS — BP 126/62 | HR 60 | Ht 62.0 in | Wt 126.4 lb

## 2022-09-12 DIAGNOSIS — I480 Paroxysmal atrial fibrillation: Secondary | ICD-10-CM

## 2022-09-12 DIAGNOSIS — Z5181 Encounter for therapeutic drug level monitoring: Secondary | ICD-10-CM | POA: Diagnosis not present

## 2022-09-12 DIAGNOSIS — Z950 Presence of cardiac pacemaker: Secondary | ICD-10-CM

## 2022-09-12 DIAGNOSIS — R7401 Elevation of levels of liver transaminase levels: Secondary | ICD-10-CM

## 2022-09-12 DIAGNOSIS — I422 Other hypertrophic cardiomyopathy: Secondary | ICD-10-CM

## 2022-09-12 DIAGNOSIS — I10 Essential (primary) hypertension: Secondary | ICD-10-CM

## 2022-09-12 DIAGNOSIS — I513 Intracardiac thrombosis, not elsewhere classified: Secondary | ICD-10-CM

## 2022-09-12 DIAGNOSIS — I340 Nonrheumatic mitral (valve) insufficiency: Secondary | ICD-10-CM | POA: Diagnosis not present

## 2022-09-12 DIAGNOSIS — I361 Nonrheumatic tricuspid (valve) insufficiency: Secondary | ICD-10-CM

## 2022-09-12 LAB — POCT INR: INR: 2.2 (ref 2.0–3.0)

## 2022-09-12 NOTE — Patient Instructions (Signed)
Description   Continue taking warfarin 3 tablets (3mg ) daily except for 2 tablets (2mg ) on Sundays, Tuesdays, and Thursdays. Recheck in 3 weeks.  Please call the Coumadin Clinic at [phone] with any questions

## 2022-09-12 NOTE — Patient Instructions (Signed)
 Medication Instructions:  Your physician recommends that you continue on your current medications as directed. Please refer to the Current Medication list given to you today.  *If you need a refill on your cardiac medications before your next appointment, please call your pharmacy*     Follow-Up: At [facility], you and your health needs are our priority.  As part of our continuing mission to provide you with exceptional heart care, we have created designated Provider Care Teams.  These Care Teams include your primary Cardiologist (physician) and Advanced Practice Providers (APPs -  Physician Assistants and Nurse Practitioners) who all work together to provide you with the care you need, when you need it.      Your next appointment:   3 month(s)  Provider:   Little Ishikawa, MD

## 2022-09-12 NOTE — Progress Notes (Signed)
Cardiology Office Note:    Date:  09/12/2022   ID:  Paula [name], DOB 03/16/1935, MRN [id number]  PCP:  Emilio Aspen, MD  Cardiologist:  Little Ishikawa, MD  Electrophysiologist:  Lanier Prude, MD   Referring MD: Emilio Aspen, *   Chief Complaint  Patient presents with   Cardiomyopathy    History of Present Illness:    Paula [name] is a 87 y.o. female with a hx of paroxysmal atrial fibrillation, tachybrady syndrome status post PPM, hypertension, hypothyroidism who presents for follow-up.  She was referred by Alphonzo Severance, PA for evaluation of hypertrophic cardiomyopathy, initially seen on 06/14/2019.  Diagnosed with atrial fibrillation after presenting with palpitations to PCP office in April. EKG showed A. fib with RVR. Started on Eliquis given CHA2DS2-VASc score 5.  Also started on diltiazem for rate control. She was seen in AF clinic on 05/11/2019, she was noted to be in normal sinus rhythm. She was continued on Eliquis 5 mg twice daily and diltiazem 180 mg daily. TTE on 05/27/19 was concerning for apical variant hypertrophic cardiomyopathy, EF 60 to 65%, normal RV function, moderate elevation in RVSP (48 mmHg), mild mitral regurgitation, mild to moderate tricuspid regurgitation.  Cardiac MRI on 07/05/2019 showed LV apical hypertrophy measuring up to 15 mm, consistent with apical hypertrophic cardiomyopathy, small apical aneurysm, patchy apical LGE accounting for 1% of total myocardial mass, hyperdynamic LV systolic function, normal RV function, mild MR (regurgitant fraction 24%).  Zio patch x7 days on 07/14/2019 showed AF burden 47%, with episode lasting 3 days and average rate 118 bpm, no NSVT, frequent episodes of SVT, longest lasting 19 seconds.  She was admitted in June 2022 with near syncope, underwent PPM for tachybrady syndrome.  Echo 02/11/2022 showed EF 60 to 65%, severe asymmetric LV apical hypertrophy, normal RV function, severe left  atrial enlargement, moderate to severe mitral regurgitation, moderate to severe tricuspid regurgitation.  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  Since last clinic visit, she reports she is doing okay.  Reports back pain has improved.  Denies any chest pain, dyspnea, lower extremity edema, or palpitations.  Does reports she tires out easily.  Reports some lightheadedness but denies any syncope.  She is taking Lasix about once every couple weeks.  Reports her foot pain resolved with stopping rosuvastatin.   Wt Readings from Last 3 Encounters:  09/12/22 126 lb 6.4 oz (57.3 kg)  07/04/22 126 lb 9.6 oz (57.4 kg)  06/10/22 126 lb (57.2 kg)     Past Medical History:  Diagnosis Date   CHF (congestive heart failure) (HCC)    Hypothyroidism    Irregular heart rate    Thyroid disease     Past Surgical History:  Procedure Laterality Date   PACEMAKER IMPLANT N/A 07/17/2020   Procedure: PACEMAKER IMPLANT;  Surgeon: Marinus Maw, MD;  Location: [facility];  Service: Cardiovascular;  Laterality: N/A;    Current Medications: Current Meds  Medication Sig   acetaminophen (TYLENOL) 325 MG tablet every 4 (four) hours as needed for moderate pain or headache.   amiodarone (PACERONE) 100 MG tablet Take 1 tablet (100 mg total) by mouth daily.   Calcium Carb-Cholecalciferol 600-5 MG-MCG TABS daily at  6 (six) AM.   diltiazem (CARDIZEM CD) 300 MG 24 hr capsule TAKE 1 CAPSULE EVERY DAY   famotidine (PEPCID AC) 10 MG tablet as needed for heartburn or indigestion.   furosemide (LASIX) 20 MG tablet Take 1 tablet (20 mg total) by mouth daily as needed.   levothyroxine (SYNTHROID,  LEVOTHROID) 88 MCG tablet Take 88 mcg by mouth daily before breakfast.   linaclotide (LINZESS) 290 MCG CAPS capsule Take 1 capsule (290 mcg total) by mouth daily before breakfast.   Multiple Vitamin (MULTIVITAMIN WITH MINERALS) TABS tablet Take 1 tablet by mouth daily.   polyethylene glycol powder (GLYCOLAX/MIRALAX) 17 GM/SCOOP powder Take 17 g by mouth 2 (two) times daily.   prazosin (MINIPRESS) 1 MG capsule TAKE 1 CAPSULE AT BEDTIME   sodium chloride (OCEAN) 0.65 % SOLN nasal spray Place 1 spray into both nostrils as needed for congestion.   valsartan (DIOVAN) 160 MG tablet Take 1.5 tablets (240 mg total) by mouth daily.     Allergies:   Wound dressing adhesive   Social History   Socioeconomic History   Marital status: Married    Spouse name: Not on file   Number of children: Not on file   Years of education: Not on file   Highest education level: Not on file  Occupational History   Not on file  Tobacco Use   Smoking status: Never   Smokeless tobacco: Never  Vaping Use   Vaping status: Never Used  Substance and Sexual Activity   Alcohol use: Yes    Alcohol/week: 1.0 standard drink of alcohol    Types: 1 Glasses of wine per week   Drug use: No   Sexual activity: Not Currently    Birth control/protection: Post-menopausal  Other Topics Concern   Not on file  Social History Narrative   Not on file   Social Determinants of Health   Financial Resource Strain: Not on file  Food Insecurity: Not on file  Transportation Needs: Not on file  Physical Activity: Not on file  Stress: Not on file  Social Connections: Not on file     Family History: The patient's family history includes Breast cancer (age of onset: 26) in her sister.  ROS:   Please see the history of present illness.     All other systems reviewed and are negative.  EKGs/Labs/Other Studies Reviewed:    The following studies were reviewed today:  Monitor 05/01/2020: No significant abnormalities   Patch  Wear Time:  2 days and 23 hours (2022-04-01T16:38:40-0400 to 2022-04-04T16:10:58-0400)   Patient had a min HR of 35 bpm, max HR of 119 bpm, and avg HR of 61 bpm. Predominant underlying rhythm was Sinus Rhythm. 2 Supraventricular Tachycardia runs occurred, the run with the fastest interval lasting 4 beats with a max rate of 119 bpm, the longest lasting 4 beats with an avg rate of 98 bpm. Isolated SVEs were rare (<1.0%), SVE Couplets were rare (<1.0%), and SVE Triplets were rare (<1.0%). Isolated VEs were rare (<1.0%), and no VE Couplets or VE Triplets were present.  No patient triggered events  Monitor 07/14/2019: Episode of atrial fibrillation lasting 3 days 6 hours, average rate 118 bpm. AF burden 47%. 141 episodes of SVT, longest lasting 18.5 seconds.   7 days of data recorded on Zio monitor. Patient had a min HR of 55 bpm, max HR of 207 bpm, and avg HR of 95 bpm. Predominant underlying rhythm was Sinus Rhythm. No VT, high degree block, or pauses noted.  141 episodes of SVT, longest lasting 18.5 seconds.  Episode of atrial fibrillation lasting 3 days 6 hours, average rate 118 bpm.  AF burden 47%.   Isolated atrial and ventricular ectopy was rare (<1%). There were 0 triggered events.  CMR 07/05/2019: 1. LV hypertrophy at apex measuring up to 15mm, consistent with apical hypertrophic cardiomyopathy   2.  Small apical aneurysm   3. Patchy LGE at apex, consistent with apical HCM. LGE accounts for 1% of total myocardial mass   4.  Normal LV size with hyperdynamic systolic function (EF 70%)   5.  Normal RV size and systolic function (EF 58%)   6. Mild mitral regurgitation (regurgitant volume 16cc, regurgitant fraction 24%)  Echo 05/27/2019: 1. Apical variant hypertrophic cardiomyopathy with no evidence of apical  thrombus. Left ventricular ejection fraction, by estimation, is 60 to 65%.  The left ventricle has normal function. The left ventricle demonstrates  regional wall motion   abnormalities (see scoring diagram/findings for description). There is  severe left ventricular hypertrophy of the apical segment. Left  ventricular diastolic parameters are consistent with Grade I diastolic  dysfunction (impaired relaxation).   2. Right ventricular systolic function is normal. The right ventricular  size is normal. There is moderately elevated pulmonary artery systolic  pressure. The estimated right ventricular systolic pressure is 47.9 mmHg.   3. Left atrial size was mildly dilated.   4. The mitral valve is normal in structure. Mild mitral valve  regurgitation. No evidence of mitral stenosis.   5. Tricuspid valve regurgitation is mild to moderate.   6. Possible fusion of right and non coronary cusp. The aortic valve is  normal in structure. Aortic valve regurgitation is not visualized. Mild to  moderate aortic valve sclerosis/calcification is present, without any  evidence of aortic stenosis.   7. The inferior vena cava is normal in size with greater than 50%  respiratory variability, suggesting right atrial pressure of 3 mmHg.  Exercise Myoview 07/12/2016: Nuclear stress EF: 64%. Blood pressure demonstrated a normal response to exercise. Horizontal ST segment depression ST segment depression was noted during stress in the III, aVF, V4, V5 and V6 leads, and returning to baseline after 1-5 minutes of recovery. Findings are nonspecific given baseline T wave inversions. The study is normal. This is a low risk study. The left ventricular ejection fraction is normal (55-65%).   EKG:   09/12/2022: Atrial paced rhythm, rate 60, poor R wave progression, nonspecific T wave flattening 01/08/2022: Atrial paced, rate 60, poor R wave progression, nonspecific T wave flattening 07/04/2021: Atrial paced, rate 60, Q waves in V1/2, nonspecific T wave flattening 12/27/20: Atrial paced, rate 60, Q waves in V1/2, no ST abnormalities 07/10/2020: EKG is not ordered today. 04/14/2020: sinus  rhythm, rate 74, LVH with repolarization abnormalities  Recent Labs: 03/19/2022: TSH 3.074 05/28/2022: BNP 154.5; Hemoglobin 11.8; Magnesium 2.2; Platelets 379 06/06/2022: ALT 100; BUN 9; Creatinine, Ser 0.90; Potassium 4.3; Sodium 131  Recent Lipid Panel    Component Value Date/Time   CHOL 174 03/20/2021 0809   TRIG 79 03/20/2021 0809   HDL 93 03/20/2021 0809   CHOLHDL 1.9 03/20/2021 0809   LDLCALC 66 03/20/2021 0809    Physical Exam:    VS:  BP 126/62 (BP Location: Left Arm, Patient Position: Sitting, Cuff Size: Normal)   Pulse 60   Ht 5\' 2"  (1.575 m)   Wt 126 lb 6.4 oz (57.3 kg)   SpO2 99%   BMI 23.12 kg/m  Wt Readings from Last 3 Encounters:  09/12/22 126 lb 6.4 oz (57.3 kg)  07/04/22 126 lb 9.6 oz (57.4 kg)  06/10/22 126 lb (57.2 kg)     GEN: in no acute distress HEENT: Normal NECK: No JVD; No carotid bruits CARDIAC: RRR, 2/6 systolic murmur RESPIRATORY:  Clear to auscultation without rales, wheezing or rhonchi  ABDOMEN: Soft, non-tender, non-distended MUSCULOSKELETAL:  No edema; No deformity  SKIN: Warm and dry NEUROLOGIC:  Alert and oriented x 3 PSYCHIATRIC:  Normal affect   ASSESSMENT:    1. Paroxysmal atrial fibrillation (HCC)   2. Hypertrophic cardiomyopathy (HCC)   3. Primary hypertension   4. Pacemaker   5. Transaminitis   6. LV (left ventricular) mural thrombus   7. Nonrheumatic mitral valve regurgitation   8. Nonrheumatic tricuspid valve regurgitation     PLAN:    Hypertrophic cardiomyopathy: cardiac MRI on 07/05/2019 showed LV apical hypertrophy measuring up to 15 mm, consistent with apical hypertrophic cardiomyopathy, small apical aneurysm, patchy apical LGE accounting for 1% of total myocardial mass.  No NSVT on Zio patch x 7 days.  Echo 02/11/2022 showed EF 60 to 65%, severe asymmetric LV apical hypertrophy, normal RV function, severe left atrial enlargement, moderate to severe mitral regurgitation, moderate to severe tricuspid regurgitation.   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  This was developed despite being on Eliquis.  Recommended switching to warfarin -Follows in Coumadin clinic for INR management  Mitral regurgitation/tricuspid regurgitation: Moderate to severe MR and moderate TR on CMR 03/2022.  Will monitor -Takes Lasix 20 mg daily as needed.  Monitor daily weights and take as needed if gains more than 3 pounds in 1 day or 5 pounds in 1 week  Paroxysmal atrial fibrillation: Diagnosed 04/2019 after presenting to PCPs office with palpitations. CHA2DS2-VASc score 5.  Zio patch x7 days on 07/14/2019 showed AF burden 47%, with episode lasting 3 days and average rate 118 bpm.  She was admitted in June 2022 with near syncope, underwent PPM for tachybrady syndrome. -Continue Eliquis 5 mg twice daily -Was on amiodarone 200 mg daily.  Transaminitis noted on labs, dose reduced to 100 mg daily -Continue diltiazem 300 mg daily  SVT: Frequent episodes on ZIO monitoring, longest lasting 19 seconds.  Continue diltiazem  Hypertension: On valsartan 240 mg mg daily, prazosin 1 mg nightly, diltiazem 300 mg daily.  BP appears controlled  Hyperlipidemia: Has been on rosuvastatin 5 mg daily, but held recently due to transaminitis.  LDL 81 on 08/12/2022  Transaminitis: elevation in liver enzymes noted.  Amiodarone dose decreased to 100 mg daily.  Rosuvastatin discontinued.  Recent liver ultrasound unremarkable.  Referred to hepatology for  evaluation   RTC in 3 months  Medication Adjustments/Labs and Tests Ordered: Current medicines are reviewed at length with the patient today.  Concerns regarding medicines are outlined above.  Orders Placed This Encounter  Procedures   Ambulatory referral to Gastroenterology   EKG 12-Lead    No orders of the defined types were placed in this encounter.     Patient Instructions  Medication Instructions:  Your physician recommends that you continue on your current medications as directed. Please refer to the Current Medication list given to you today.  *If you need a refill on your cardiac medications before your next appointment, please call your pharmacy*   Follow-Up: At [facility], you and your health needs are our priority.  As part of our continuing mission to provide you with exceptional heart care, we have created designated Provider Care Teams.  These Care Teams include your primary Cardiologist (physician) and Advanced Practice Providers (APPs -  Physician Assistants and Nurse Practitioners) who all work together to provide you with the care you need, when you need it.   Your next appointment:   3 month(s)  Provider:   Little Ishikawa, MD      Signed, Little Ishikawa, MD  09/12/2022 5:31 PM    [facility]

## 2022-09-18 DIAGNOSIS — M7918 Myalgia, other site: Secondary | ICD-10-CM | POA: Diagnosis not present

## 2022-09-24 ENCOUNTER — Encounter: Payer: Self-pay | Admitting: Cardiology

## 2022-10-03 ENCOUNTER — Ambulatory Visit: Payer: Medicare HMO | Attending: Internal Medicine

## 2022-10-03 DIAGNOSIS — Z5181 Encounter for therapeutic drug level monitoring: Secondary | ICD-10-CM | POA: Diagnosis not present

## 2022-10-03 DIAGNOSIS — I480 Paroxysmal atrial fibrillation: Secondary | ICD-10-CM | POA: Diagnosis not present

## 2022-10-03 LAB — POCT INR: INR: 1.7 — AB (ref 2.0–3.0)

## 2022-10-03 MED ORDER — WARFARIN SODIUM 1 MG PO TABS: ORAL_TABLET | ORAL | 0 refills | Status: DC

## 2022-10-03 NOTE — Patient Instructions (Signed)
Description   Take 2.5 tablets today and then START taking warfarin 3 tablets (3mg ) daily except for 2 tablets (2mg ) on Tuesdays, and Thursdays.  Recheck in 2 weeks.  Please call the Coumadin Clinic at [phone] with any questions

## 2022-10-17 ENCOUNTER — Ambulatory Visit: Payer: Medicare HMO | Attending: Cardiology

## 2022-10-17 ENCOUNTER — Ambulatory Visit (INDEPENDENT_AMBULATORY_CARE_PROVIDER_SITE_OTHER): Payer: Medicare HMO

## 2022-10-17 DIAGNOSIS — I422 Other hypertrophic cardiomyopathy: Secondary | ICD-10-CM | POA: Diagnosis not present

## 2022-10-17 DIAGNOSIS — I480 Paroxysmal atrial fibrillation: Secondary | ICD-10-CM

## 2022-10-17 DIAGNOSIS — Z5181 Encounter for therapeutic drug level monitoring: Secondary | ICD-10-CM

## 2022-10-17 LAB — POCT INR: INR: 1.9 — AB (ref 2.0–3.0)

## 2022-10-17 NOTE — Patient Instructions (Signed)
Description   START taking warfarin 3 tablets (3mg ) daily except for 2 tablets (2mg ) on Tuesdays.  Recheck in 3 weeks.  Please call the Coumadin Clinic at [phone] with any questions

## 2022-10-18 LAB — CUP PACEART REMOTE DEVICE CHECK
Battery Remaining Longevity: 93 mo
Battery Remaining Percentage: 83 %
Battery Voltage: 3.02 V
Brady Statistic AP VP Percent: 1 %
Brady Statistic AP VS Percent: 60 %
Brady Statistic AS VP Percent: 1 %
Brady Statistic AS VS Percent: 40 %
Brady Statistic RA Percent Paced: 60 %
Brady Statistic RV Percent Paced: 1 %
Date Time Interrogation Session: 20240926224433
Implantable Lead Connection Status: 753985
Implantable Lead Connection Status: 753985
Implantable Lead Implant Date: 20220627
Implantable Lead Implant Date: 20220627
Implantable Lead Location: 753859
Implantable Lead Location: 753860
Implantable Pulse Generator Implant Date: 20220627
Lead Channel Impedance Value: 380 Ohm
Lead Channel Impedance Value: 510 Ohm
Lead Channel Pacing Threshold Amplitude: 0.75 V
Lead Channel Pacing Threshold Amplitude: 1 V
Lead Channel Pacing Threshold Pulse Width: 0.5 ms
Lead Channel Pacing Threshold Pulse Width: 0.5 ms
Lead Channel Sensing Intrinsic Amplitude: 3.6 mV
Lead Channel Sensing Intrinsic Amplitude: 7.2 mV
Lead Channel Setting Pacing Amplitude: 2.5 V
Lead Channel Setting Pacing Amplitude: 2.5 V
Lead Channel Setting Pacing Pulse Width: 0.5 ms
Lead Channel Setting Sensing Sensitivity: 2 mV
Pulse Gen Model: 2272
Pulse Gen Serial Number: 3933448

## 2022-10-21 ENCOUNTER — Encounter: Payer: Self-pay | Admitting: Gastroenterology

## 2022-10-23 ENCOUNTER — Other Ambulatory Visit: Payer: Self-pay | Admitting: Cardiology

## 2022-10-23 DIAGNOSIS — E785 Hyperlipidemia, unspecified: Secondary | ICD-10-CM

## 2022-10-30 NOTE — Progress Notes (Signed)
Remote pacemaker transmission.   

## 2022-11-07 ENCOUNTER — Ambulatory Visit: Payer: Medicare HMO | Attending: Cardiology | Admitting: *Deleted

## 2022-11-07 DIAGNOSIS — I513 Intracardiac thrombosis, not elsewhere classified: Secondary | ICD-10-CM | POA: Diagnosis not present

## 2022-11-07 DIAGNOSIS — Z5181 Encounter for therapeutic drug level monitoring: Secondary | ICD-10-CM | POA: Diagnosis not present

## 2022-11-07 DIAGNOSIS — I480 Paroxysmal atrial fibrillation: Secondary | ICD-10-CM

## 2022-11-07 LAB — POCT INR: INR: 1.6 — AB (ref 2.0–3.0)

## 2022-11-07 NOTE — Patient Instructions (Addendum)
Description   Today take 4 tablets of warfarin the START taking warfarin 3 tablets (3mg ) daily. Recheck in 2 weeks. LET us KNOW WHEN YOU RUN OUT OF THE 1MG  TABLETS, CAN USE DIFFERENT STRENGTH.  Please call the Coumadin Clinic at [phone] with any questions

## 2022-11-21 ENCOUNTER — Ambulatory Visit: Payer: Medicare HMO | Attending: Cardiology | Admitting: *Deleted

## 2022-11-21 DIAGNOSIS — I513 Intracardiac thrombosis, not elsewhere classified: Secondary | ICD-10-CM | POA: Diagnosis not present

## 2022-11-21 DIAGNOSIS — Z5181 Encounter for therapeutic drug level monitoring: Secondary | ICD-10-CM

## 2022-11-21 DIAGNOSIS — I480 Paroxysmal atrial fibrillation: Secondary | ICD-10-CM | POA: Diagnosis not present

## 2022-11-21 LAB — POCT INR: INR: 2 (ref 2.0–3.0)

## 2022-11-21 NOTE — Patient Instructions (Addendum)
Description   Today take 4 tablets of warfarin the START taking warfarin 3 tablets (3mg ) daily except 4 tablets (4mg ) on Sundays. Recheck in 3 weeks. LET us KNOW WHEN YOU RUN OUT OF THE 1MG  TABLETS, CAN USE DIFFERENT STRENGTH.  Please call the Coumadin Clinic at [phone] with any questions

## 2022-12-03 NOTE — Progress Notes (Unsigned)
Cardiology Office Note:    Date:  12/04/2022   ID:  Paula [name], DOB 06/05/1935, MRN [id number]  PCP:  Emilio Aspen, MD  Cardiologist:  Little Ishikawa, MD  Electrophysiologist:  Lanier Prude, MD   Referring MD: Emilio Aspen, *   Chief Complaint  Patient presents with   Atrial Fibrillation    History of Present Illness:    Paula [name] is a 87 y.o. female with a hx of paroxysmal atrial fibrillation, tachybrady syndrome status post PPM, hypertension, hypothyroidism who presents for follow-up.  She was referred by Alphonzo Severance, PA for evaluation of hypertrophic cardiomyopathy, initially seen on 06/14/2019.  Diagnosed with atrial fibrillation after presenting with palpitations to PCP office in April 2021. EKG showed A. fib with RVR. Started on Eliquis given CHA2DS2-VASc score 5.  Also started on diltiazem for rate control. She was seen in AF clinic on 05/11/2019, she was noted to be in normal sinus rhythm. She was continued on Eliquis 5 mg twice daily and diltiazem 180 mg daily. TTE on 05/27/19 was concerning for apical variant hypertrophic cardiomyopathy, EF 60 to 65%, normal RV function, moderate elevation in RVSP (48 mmHg), mild mitral regurgitation, mild to moderate tricuspid regurgitation.  Cardiac MRI on 07/05/2019 showed LV apical hypertrophy measuring up to 15 mm, consistent with apical hypertrophic cardiomyopathy, small apical aneurysm, patchy apical LGE accounting for 1% of total myocardial mass, hyperdynamic LV systolic function, normal RV function, mild MR (regurgitant fraction 24%).  Zio patch x7 days on 07/14/2019 showed AF burden 47%, with episode lasting 3 days and average rate 118 bpm, no NSVT, frequent episodes of SVT, longest lasting 19 seconds.  She was admitted in June 2022 with near syncope, underwent PPM for tachybrady syndrome.  Echo 02/11/2022 showed EF 60 to 65%, severe asymmetric LV apical hypertrophy, normal RV function,  severe left atrial enlargement, moderate to severe mitral regurgitation, moderate to severe tricuspid regurgitation.  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  Since last clinic visit, she reports she is doing okay.  She has been going to fitness class once per week.  Denies any chest pain or dyspnea.  Reports some lightheadedness when she takes her meds in the morning but has improved and denies any syncope.  Reports occasional lower extremity edema.  Takes Lasix every 2 weeks or so.  Denies any bleeding issues.  Wt Readings from Last 3 Encounters:  12/04/22 128 lb 12.8 oz (58.4 kg)  09/12/22 126 lb 6.4 oz (57.3 kg)  07/04/22 126 lb 9.6 oz (57.4 kg)   BP Readings from Last 3 Encounters:  12/04/22 130/66  09/12/22 126/62  07/04/22 (!) 160/71     Past Medical History:  Diagnosis Date   CHF (congestive heart failure) (HCC)    Hypothyroidism    Irregular heart rate    Thyroid disease     Past Surgical History:  Procedure Laterality Date   PACEMAKER IMPLANT N/A 07/17/2020   Procedure: PACEMAKER IMPLANT;  Surgeon: Marinus Maw, MD;  Location: [facility];  Service: Cardiovascular;  Laterality: N/A;    Current Medications: Current Meds  Medication Sig   acetaminophen (TYLENOL) 325 MG tablet every 4 (four) hours as needed for moderate pain or  headache.   amiodarone (PACERONE) 100 MG tablet Take 1 tablet (100 mg total) by mouth daily.   Calcium Carb-Cholecalciferol 600-5 MG-MCG TABS daily at 6 (six) AM.   diltiazem (CARDIZEM CD) 300 MG 24 hr capsule TAKE 1 CAPSULE EVERY DAY   famotidine (PEPCID AC) 10 MG tablet as needed for heartburn or indigestion.   furosemide  (LASIX) 20 MG tablet Take 1 tablet (20 mg total) by mouth daily as needed.   levothyroxine (SYNTHROID, LEVOTHROID) 88 MCG tablet Take 88 mcg by mouth daily before breakfast.   linaclotide (LINZESS) 290 MCG CAPS capsule Take 1 capsule (290 mcg total) by mouth daily before breakfast.   Multiple Vitamin (MULTIVITAMIN WITH MINERALS) TABS tablet Take 1 tablet by mouth daily.   polyethylene glycol powder (GLYCOLAX/MIRALAX) 17 GM/SCOOP powder Take 17 g by mouth 2 (two) times daily.   prazosin (MINIPRESS) 1 MG capsule TAKE 1 CAPSULE AT BEDTIME   sodium chloride (OCEAN) 0.65 % SOLN nasal spray Place 1 spray into both nostrils as needed for congestion.   valsartan (DIOVAN) 160 MG tablet Take 1.5 tablets (240 mg total) by mouth daily.   warfarin (COUMADIN) 1 MG tablet Take 2 tablets to 3 tablets by mouth daily or as directed by Coumadin Clinic.     Allergies:   Wound dressing adhesive   Social History   Socioeconomic History   Marital status: Married    Spouse name: Not on file   Number of children: Not on file   Years of education: Not on file   Highest education level: Not on file  Occupational History   Not on file  Tobacco Use   Smoking status: Never   Smokeless tobacco: Never  Vaping Use   Vaping status: Never Used  Substance and Sexual Activity   Alcohol use: Yes    Alcohol/week: 1.0 standard drink of alcohol    Types: 1 Glasses of wine per week   Drug use: No   Sexual activity: Not Currently    Birth control/protection: Post-menopausal  Other Topics Concern   Not on file  Social History Narrative   Not on file   Social Determinants of Health   Financial Resource Strain: Not on file  Food Insecurity: Not on file  Transportation Needs: Not on file  Physical Activity: Not on file  Stress: Not on file  Social Connections: Not on file     Family History: The patient's family history includes Breast cancer (age of onset: 56) in her sister.  ROS:   Please see the history of  present illness.     All other systems reviewed and are negative.  EKGs/Labs/Other Studies Reviewed:    The following studies were reviewed today:  Monitor 05/01/2020: No significant abnormalities   Patch Wear Time:  2 days and 23 hours (2022-04-01T16:38:40-0400 to 2022-04-04T16:10:58-0400)   Patient had a min HR of 35 bpm, max HR of 119 bpm, and avg HR of 61 bpm. Predominant underlying rhythm was Sinus Rhythm. 2 Supraventricular Tachycardia runs occurred, the run with the fastest interval lasting 4 beats with a max rate of 119 bpm, the longest lasting 4 beats with an avg rate of 98 bpm. Isolated SVEs were rare (<1.0%), SVE Couplets were rare (<1.0%), and SVE Triplets were rare (<1.0%). Isolated VEs were rare (<1.0%), and no VE Couplets or VE Triplets were present.  No patient triggered events  Monitor 07/14/2019: Episode of atrial fibrillation lasting 3 days 6 hours, average rate 118 bpm. AF burden 47%. 141 episodes of  SVT, longest lasting 18.5 seconds.   7 days of data recorded on Zio monitor. Patient had a min HR of 55 bpm, max HR of 207 bpm, and avg HR of 95 bpm. Predominant underlying rhythm was Sinus Rhythm. No VT, high degree block, or pauses noted.  141 episodes of SVT, longest lasting 18.5 seconds.  Episode of atrial fibrillation lasting 3 days 6 hours, average rate 118 bpm.  AF burden 47%.   Isolated atrial and ventricular ectopy was rare (<1%). There were 0 triggered events.  CMR 07/05/2019: 1. LV hypertrophy at apex measuring up to 15mm, consistent with apical hypertrophic cardiomyopathy   2.  Small apical aneurysm   3. Patchy LGE at apex, consistent with apical HCM. LGE accounts for 1% of total myocardial mass   4.  Normal LV size with hyperdynamic systolic function (EF 70%)   5.  Normal RV size and systolic function (EF 58%)   6. Mild mitral regurgitation (regurgitant volume 16cc, regurgitant fraction 24%)  Echo 05/27/2019: 1. Apical variant hypertrophic cardiomyopathy  with no evidence of apical  thrombus. Left ventricular ejection fraction, by estimation, is 60 to 65%.  The left ventricle has normal function. The left ventricle demonstrates  regional wall motion  abnormalities (see scoring diagram/findings for description). There is  severe left ventricular hypertrophy of the apical segment. Left  ventricular diastolic parameters are consistent with Grade I diastolic  dysfunction (impaired relaxation).   2. Right ventricular systolic function is normal. The right ventricular  size is normal. There is moderately elevated pulmonary artery systolic  pressure. The estimated right ventricular systolic pressure is 47.9 mmHg.   3. Left atrial size was mildly dilated.   4. The mitral valve is normal in structure. Mild mitral valve  regurgitation. No evidence of mitral stenosis.   5. Tricuspid valve regurgitation is mild to moderate.   6. Possible fusion of right and non coronary cusp. The aortic valve is  normal in structure. Aortic valve regurgitation is not visualized. Mild to  moderate aortic valve sclerosis/calcification is present, without any  evidence of aortic stenosis.   7. The inferior vena cava is normal in size with greater than 50%  respiratory variability, suggesting right atrial pressure of 3 mmHg.  Exercise Myoview 07/12/2016: Nuclear stress EF: 64%. Blood pressure demonstrated a normal response to exercise. Horizontal ST segment depression ST segment depression was noted during stress in the III, aVF, V4, V5 and V6 leads, and returning to baseline after 1-5 minutes of recovery. Findings are nonspecific given baseline T wave inversions. The study is normal. This is a low risk study. The left ventricular ejection fraction is normal (55-65%).   EKG:   09/12/2022: Atrial paced rhythm, rate 60, poor R wave progression, nonspecific T wave flattening 01/08/2022: Atrial paced, rate 60, poor R wave progression, nonspecific T wave  flattening 07/04/2021: Atrial paced, rate 60, Q waves in V1/2, nonspecific T wave flattening 12/27/20: Atrial paced, rate 60, Q waves in V1/2, no ST abnormalities 07/10/2020: EKG is not ordered today. 04/14/2020: sinus rhythm, rate 74, LVH with repolarization abnormalities  Recent Labs: 03/19/2022: TSH 3.074 05/28/2022: BNP 154.5; Hemoglobin 11.8; Magnesium 2.2; Platelets 379 06/06/2022: ALT 100; BUN 9; Creatinine, Ser 0.90; Potassium 4.3; Sodium 131  Recent Lipid Panel    Component Value Date/Time   CHOL 174 03/20/2021 0809   TRIG 79 03/20/2021 0809   HDL 93 03/20/2021 0809   CHOLHDL 1.9 03/20/2021 0809   LDLCALC 66 03/20/2021 0809    Physical Exam:  VS:  BP 130/66 (BP Location: Left Arm, Patient Position: Sitting, Cuff Size: Normal)   Pulse 61   Ht 5\' 2"  (1.575 m)   Wt 128 lb 12.8 oz (58.4 kg)   SpO2 98%   BMI 23.56 kg/m     Wt Readings from Last 3 Encounters:  12/04/22 128 lb 12.8 oz (58.4 kg)  09/12/22 126 lb 6.4 oz (57.3 kg)  07/04/22 126 lb 9.6 oz (57.4 kg)     GEN: in no acute distress HEENT: Normal NECK: No JVD; No carotid bruits CARDIAC: RRR, 2/6 systolic murmur RESPIRATORY:  Clear to auscultation without rales, wheezing or rhonchi  ABDOMEN: Soft, non-tender, non-distended MUSCULOSKELETAL:  No edema; No deformity  SKIN: Warm and dry NEUROLOGIC:  Alert and oriented x 3 PSYCHIATRIC:  Normal affect   ASSESSMENT:    1. Hypertrophic cardiomyopathy (HCC)   2. LV (left ventricular) mural thrombus   3. Tricuspid valve insufficiency, unspecified etiology   4. Mitral valve insufficiency, unspecified etiology   5. Hyperlipidemia, unspecified hyperlipidemia type   6. Hypertension, unspecified type   7. Transaminitis     PLAN:    Hypertrophic cardiomyopathy: cardiac MRI on 07/05/2019 showed LV apical hypertrophy measuring up to 15 mm, consistent with apical hypertrophic cardiomyopathy, small apical aneurysm, patchy apical LGE accounting for 1% of total myocardial  mass.  No NSVT on Zio patch x 7 days.  Echo 02/11/2022 showed EF 60 to 65%, severe asymmetric LV apical hypertrophy, normal RV function, severe left atrial enlargement, moderate to severe mitral regurgitation, moderate to severe tricuspid regurgitation.  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  This was developed despite being on Eliquis.  Recommended switching to warfarin -Follows in Coumadin clinic for INR management  Mitral regurgitation/tricuspid regurgitation: Moderate to severe MR and moderate TR on CMR 03/2022.  Will monitor -Takes Lasix 20 mg daily as needed.  Monitor daily weights and take as needed if gains more than 3 pounds in 1 day or 5 pounds in 1 week.  Appears euvolemic in clinic today, reports has been taking Lasix about once every 2 weeks  Paroxysmal atrial fibrillation: Diagnosed 04/2019 after presenting to PCPs office with palpitations. CHA2DS2-VASc score 5.  Zio patch x7 days on 07/14/2019 showed AF burden 47%, with episode lasting 3 days and average rate 118 bpm.  She was admitted in June 2022 with near syncope, underwent PPM for tachybrady syndrome. -Continue warfarin, follows in Coumadin clinic -Was on amiodarone 200 mg daily.  Transaminitis noted on labs, dose reduced to 100 mg daily.  Check LFTs -Continue diltiazem 300 mg daily  SVT: Frequent episodes on ZIO monitoring, longest lasting 19 seconds.  Continue diltiazem  Hypertension: On valsartan 240 mg mg daily, prazosin 1 mg  nightly, diltiazem 300 mg daily.  BP appears controlled  Hyperlipidemia: Has been on rosuvastatin 5 mg daily, but held recently due to transaminitis.  LDL 81 on 08/12/2022.  Check lipid panel  Transaminitis: elevation in liver enzymes noted.  Amiodarone dose decreased to 100 mg daily.  Rosuvastatin discontinued.  Liver ultrasound unremarkable.  Referred to hepatology for  evaluation   RTC in 3 months  Medication Adjustments/Labs and Tests Ordered: Current medicines are reviewed at length with the patient today.  Concerns regarding medicines are outlined above.  Orders Placed This Encounter  Procedures   Comprehensive Metabolic Panel (CMET)   CBC w/Diff/Platelet   Lipid panel   ECHOCARDIOGRAM COMPLETE    No orders of the defined types were placed in this encounter.     Patient Instructions  Medication Instructions:  Continue all current medications *If you need a refill on your cardiac medications before your next appointment, please call your pharmacy*   Lab Work: CMET, CBC, Lipid panel today If you have labs (blood work) drawn today and your tests are completely normal, you will receive your results only by: MyChart Message (if you have MyChart) OR A paper copy in the mail If you have any lab test that is abnormal or we need to change your treatment, we will call you to review the results.   Testing/Procedures: Echo  Your physician has requested that you have an echocardiogram. Echocardiography is a painless test that uses sound waves to create images of your heart. It provides your doctor with information about the size and shape of your heart and how well your heart's chambers and valves are working. This procedure takes approximately one hour. There are no restrictions for this procedure. Please do NOT wear cologne, perfume, aftershave, or lotions (deodorant is allowed). Please arrive 15 minutes prior to your appointment time.  Please note: We ask at that you not bring  children with you during ultrasound (echo/ vascular) testing. Due to room size and safety concerns, children are not allowed in the ultrasound rooms during exams. Our front office staff cannot provide observation of children in our lobby area while testing is being conducted. An adult accompanying a patient to their appointment will only be allowed in the ultrasound room at the discretion of the ultrasound technician under special circumstances. We apologize for any inconvenience.    Follow-Up: At [facility], you and your health needs are our priority.  As part of our continuing mission to provide you with exceptional heart care, we have created designated Provider Care Teams.  These Care Teams include your primary Cardiologist (physician) and Advanced Practice Providers (APPs -  Physician Assistants and Nurse Practitioners) who all work together to provide you with the care you need, when you need it.  We recommend signing up for the patient portal called "MyChart".  Sign up information is provided on this After Visit Summary.  MyChart is used to connect with patients for Virtual Visits (Telemedicine).  Patients are able to view lab/test results, encounter notes, upcoming appointments, etc.  Non-urgent messages can be sent to your provider as well.   To learn more about what you can do with MyChart, go to [email or web address].    Your next appointment:   4 month(s). Please schedule appt after Echo is done   Provider:   Little Ishikawa, MD         Signed, Little Ishikawa, MD  12/04/2022 11:45 AM    [facility]

## 2022-12-04 ENCOUNTER — Ambulatory Visit: Payer: Medicare HMO | Attending: Cardiology | Admitting: Cardiology

## 2022-12-04 ENCOUNTER — Encounter: Payer: Self-pay | Admitting: Cardiology

## 2022-12-04 VITALS — BP 130/66 | HR 61 | Ht 62.0 in | Wt 128.8 lb

## 2022-12-04 DIAGNOSIS — I340 Nonrheumatic mitral (valve) insufficiency: Secondary | ICD-10-CM

## 2022-12-04 DIAGNOSIS — I10 Essential (primary) hypertension: Secondary | ICD-10-CM

## 2022-12-04 DIAGNOSIS — I513 Intracardiac thrombosis, not elsewhere classified: Secondary | ICD-10-CM

## 2022-12-04 DIAGNOSIS — E785 Hyperlipidemia, unspecified: Secondary | ICD-10-CM | POA: Diagnosis not present

## 2022-12-04 DIAGNOSIS — I071 Rheumatic tricuspid insufficiency: Secondary | ICD-10-CM

## 2022-12-04 DIAGNOSIS — I422 Other hypertrophic cardiomyopathy: Secondary | ICD-10-CM | POA: Diagnosis not present

## 2022-12-04 DIAGNOSIS — R7401 Elevation of levels of liver transaminase levels: Secondary | ICD-10-CM | POA: Diagnosis not present

## 2022-12-04 NOTE — Patient Instructions (Signed)
Medication Instructions:  Continue all current medications *If you need a refill on your cardiac medications before your next appointment, please call your pharmacy*   Lab Work: CMET, CBC, Lipid panel today If you have labs (blood work) drawn today and your tests are completely normal, you will receive your results only by: MyChart Message (if you have MyChart) OR A paper copy in the mail If you have any lab test that is abnormal or we need to change your treatment, we will call you to review the results.   Testing/Procedures: Echo  Your physician has requested that you have an echocardiogram. Echocardiography is a painless test that uses sound waves to create images of your heart. It provides your doctor with information about the size and shape of your heart and how well your heart's chambers and valves are working. This procedure takes approximately one hour. There are no restrictions for this procedure. Please do NOT wear cologne, perfume, aftershave, or lotions (deodorant is allowed). Please arrive 15 minutes prior to your appointment time.  Please note: We ask at that you not bring children with you during ultrasound (echo/ vascular) testing. Due to room size and safety concerns, children are not allowed in the ultrasound rooms during exams. Our front office staff cannot provide observation of children in our lobby area while testing is being conducted. An adult accompanying a patient to their appointment will only be allowed in the ultrasound room at the discretion of the ultrasound technician under special circumstances. We apologize for any inconvenience.    Follow-Up: At [facility], you and your health needs are our priority.  As part of our continuing mission to provide you with exceptional heart care, we have created designated Provider Care Teams.  These Care Teams include your primary Cardiologist (physician) and Advanced Practice Providers (APPs -  Physician  Assistants and Nurse Practitioners) who all work together to provide you with the care you need, when you need it.  We recommend signing up for the patient portal called "MyChart".  Sign up information is provided on this After Visit Summary.  MyChart is used to connect with patients for Virtual Visits (Telemedicine).  Patients are able to view lab/test results, encounter notes, upcoming appointments, etc.  Non-urgent messages can be sent to your provider as well.   To learn more about what you can do with MyChart, go to [email or web address].    Your next appointment:   4 month(s). Please schedule appt after Echo is done   Provider:   Little Ishikawa, MD

## 2022-12-05 LAB — COMPREHENSIVE METABOLIC PANEL
ALT: 66 [IU]/L — ABNORMAL HIGH (ref 0–32)
AST: 70 [IU]/L — ABNORMAL HIGH (ref 0–40)
Albumin: 4.1 g/dL (ref 3.7–4.7)
Alkaline Phosphatase: 127 [IU]/L — ABNORMAL HIGH (ref 44–121)
BUN/Creatinine Ratio: 15 (ref 12–28)
BUN: 17 mg/dL (ref 8–27)
Bilirubin Total: 0.5 mg/dL (ref 0.0–1.2)
CO2: 22 mmol/L (ref 20–29)
Calcium: 9.2 mg/dL (ref 8.7–10.3)
Chloride: 98 mmol/L (ref 96–106)
Creatinine, Ser: 1.11 mg/dL — ABNORMAL HIGH (ref 0.57–1.00)
Globulin, Total: 2.8 g/dL (ref 1.5–4.5)
Glucose: 87 mg/dL (ref 70–99)
Potassium: 4.7 mmol/L (ref 3.5–5.2)
Sodium: 135 mmol/L (ref 134–144)
Total Protein: 6.9 g/dL (ref 6.0–8.5)
eGFR: 49 mL/min/{1.73_m2} — ABNORMAL LOW (ref >59)

## 2022-12-05 LAB — CBC WITH DIFFERENTIAL/PLATELET
Basophils Absolute: 0 10*3/uL (ref 0.0–0.2)
Basos: 1 %
EOS (ABSOLUTE): 0.2 10*3/uL (ref 0.0–0.4)
Eos: 2 %
Hematocrit: 39.6 % (ref 34.0–46.6)
Hemoglobin: 12.9 g/dL (ref 11.1–15.9)
Immature Grans (Abs): 0 10*3/uL (ref 0.0–0.1)
Immature Granulocytes: 0 %
Lymphocytes Absolute: 1.5 10*3/uL (ref 0.7–3.1)
Lymphs: 19 %
MCH: 31.2 pg (ref 26.6–33.0)
MCHC: 32.6 g/dL (ref 31.5–35.7)
MCV: 96 fL (ref 79–97)
Monocytes Absolute: 0.9 10*3/uL (ref 0.1–0.9)
Monocytes: 12 %
Neutrophils Absolute: 5.2 10*3/uL (ref 1.4–7.0)
Neutrophils: 66 %
Platelets: 323 10*3/uL (ref 150–450)
RBC: 4.13 x10E6/uL (ref 3.77–5.28)
RDW: 13.2 % (ref 11.7–15.4)
WBC: 7.9 10*3/uL (ref 3.4–10.8)

## 2022-12-05 LAB — LIPID PANEL
Chol/HDL Ratio: 3 ratio (ref 0.0–4.4)
Cholesterol, Total: 207 mg/dL — ABNORMAL HIGH (ref 100–199)
HDL: 70 mg/dL (ref >39)
LDL Chol Calc (NIH): 124 mg/dL — ABNORMAL HIGH (ref 0–99)
Triglycerides: 72 mg/dL (ref 0–149)
VLDL Cholesterol Cal: 13 mg/dL (ref 5–40)

## 2022-12-12 ENCOUNTER — Ambulatory Visit: Payer: Medicare HMO | Attending: Internal Medicine

## 2022-12-12 DIAGNOSIS — Z5181 Encounter for therapeutic drug level monitoring: Secondary | ICD-10-CM | POA: Diagnosis not present

## 2022-12-12 DIAGNOSIS — I480 Paroxysmal atrial fibrillation: Secondary | ICD-10-CM | POA: Diagnosis not present

## 2022-12-12 LAB — POCT INR: INR: 2.1 (ref 2.0–3.0)

## 2022-12-12 NOTE — Patient Instructions (Signed)
Description   Continue taking warfarin 3 tablets (3mg ) daily except 4 tablets (4mg ) on Sundays.  Recheck in 4 weeks. LET us KNOW WHEN YOU RUN OUT OF THE 1MG  TABLETS, CAN USE DIFFERENT STRENGTH.  Please call the Coumadin Clinic at [phone] with any questions

## 2022-12-13 ENCOUNTER — Other Ambulatory Visit: Payer: Self-pay

## 2022-12-13 DIAGNOSIS — I480 Paroxysmal atrial fibrillation: Secondary | ICD-10-CM

## 2022-12-13 MED ORDER — WARFARIN SODIUM 1 MG PO TABS: ORAL_TABLET | ORAL | 1 refills | Status: DC

## 2022-12-23 ENCOUNTER — Other Ambulatory Visit: Payer: Self-pay | Admitting: Internal Medicine

## 2022-12-23 DIAGNOSIS — Z1231 Encounter for screening mammogram for malignant neoplasm of breast: Secondary | ICD-10-CM

## 2023-01-09 ENCOUNTER — Ambulatory Visit: Payer: Medicare HMO | Attending: Internal Medicine

## 2023-01-09 DIAGNOSIS — Z5181 Encounter for therapeutic drug level monitoring: Secondary | ICD-10-CM | POA: Diagnosis not present

## 2023-01-09 DIAGNOSIS — I480 Paroxysmal atrial fibrillation: Secondary | ICD-10-CM

## 2023-01-09 LAB — POCT INR: INR: 2.6 (ref 2.0–3.0)

## 2023-01-09 NOTE — Patient Instructions (Signed)
Description   Continue taking warfarin 3 tablets (3mg ) daily except 4 tablets (4mg ) on Sundays.  Recheck in 5 weeks. LET us KNOW WHEN YOU RUN OUT OF THE 1MG  TABLETS, CAN USE DIFFERENT STRENGTH.  Please call the Coumadin Clinic at [phone] with any questions

## 2023-01-16 ENCOUNTER — Ambulatory Visit (INDEPENDENT_AMBULATORY_CARE_PROVIDER_SITE_OTHER): Payer: Medicare HMO

## 2023-01-16 DIAGNOSIS — I422 Other hypertrophic cardiomyopathy: Secondary | ICD-10-CM

## 2023-01-18 LAB — CUP PACEART REMOTE DEVICE CHECK
Battery Remaining Longevity: 90 mo
Battery Remaining Percentage: 81 %
Battery Voltage: 3.02 V
Brady Statistic AP VP Percent: 1 %
Brady Statistic AP VS Percent: 59 %
Brady Statistic AS VP Percent: 1 %
Brady Statistic AS VS Percent: 41 %
Brady Statistic RA Percent Paced: 58 %
Brady Statistic RV Percent Paced: 1 %
Date Time Interrogation Session: 20241226222651
Implantable Lead Connection Status: 753985
Implantable Lead Connection Status: 753985
Implantable Lead Implant Date: 20220627
Implantable Lead Implant Date: 20220627
Implantable Lead Location: 753859
Implantable Lead Location: 753860
Implantable Pulse Generator Implant Date: 20220627
Lead Channel Impedance Value: 380 Ohm
Lead Channel Impedance Value: 490 Ohm
Lead Channel Pacing Threshold Amplitude: 0.75 V
Lead Channel Pacing Threshold Amplitude: 1 V
Lead Channel Pacing Threshold Pulse Width: 0.5 ms
Lead Channel Pacing Threshold Pulse Width: 0.5 ms
Lead Channel Sensing Intrinsic Amplitude: 3.3 mV
Lead Channel Sensing Intrinsic Amplitude: 8.6 mV
Lead Channel Setting Pacing Amplitude: 2.5 V
Lead Channel Setting Pacing Amplitude: 2.5 V
Lead Channel Setting Pacing Pulse Width: 0.5 ms
Lead Channel Setting Sensing Sensitivity: 2 mV
Pulse Gen Model: 2272
Pulse Gen Serial Number: 3933448

## 2023-01-24 ENCOUNTER — Encounter: Payer: Self-pay | Admitting: Gastroenterology

## 2023-01-24 ENCOUNTER — Ambulatory Visit: Payer: Medicare HMO | Admitting: Gastroenterology

## 2023-01-24 ENCOUNTER — Other Ambulatory Visit (INDEPENDENT_AMBULATORY_CARE_PROVIDER_SITE_OTHER): Payer: Medicare HMO

## 2023-01-24 VITALS — BP 140/60 | HR 60 | Ht 62.0 in | Wt 129.0 lb

## 2023-01-24 DIAGNOSIS — R970 Elevated carcinoembryonic antigen [CEA]: Secondary | ICD-10-CM | POA: Diagnosis not present

## 2023-01-24 DIAGNOSIS — R7401 Elevation of levels of liver transaminase levels: Secondary | ICD-10-CM

## 2023-01-24 DIAGNOSIS — I480 Paroxysmal atrial fibrillation: Secondary | ICD-10-CM | POA: Diagnosis not present

## 2023-01-24 DIAGNOSIS — Z860100 Personal history of colon polyps, unspecified: Secondary | ICD-10-CM

## 2023-01-24 DIAGNOSIS — K8020 Calculus of gallbladder without cholecystitis without obstruction: Secondary | ICD-10-CM

## 2023-01-24 LAB — HEPATIC FUNCTION PANEL
ALT: 74 U/L — ABNORMAL HIGH (ref 0–35)
AST: 71 U/L — ABNORMAL HIGH (ref 0–37)
Albumin: 4.4 g/dL (ref 3.5–5.2)
Alkaline Phosphatase: 123 U/L — ABNORMAL HIGH (ref 39–117)
Bilirubin, Direct: 0.1 mg/dL (ref 0.0–0.3)
Total Bilirubin: 0.5 mg/dL (ref 0.2–1.2)
Total Protein: 7.7 g/dL (ref 6.0–8.3)

## 2023-01-24 NOTE — Patient Instructions (Signed)
 _______________________________________________________  If your blood pressure at your visit was 140/90 or greater, please contact your primary care physician to follow up on this. _______________________________________________________  If you are age 86 or older, your body mass index should be between 23-30. Your Body mass index is 23.59 kg/m. If this is out of the aforementioned range listed, please consider follow up with your Primary Care Provider. ________________________________________________________  The  GI providers would like to encourage you to use MYCHART to communicate with providers for non-urgent requests or questions.  Due to long hold times on the telephone, sending your provider a message by [facility] may be a faster and more efficient way to get a response.  Please allow 48 business hours for a response.  Please remember that this is for non-urgent requests.  _______________________________________________________  Your provider has requested that you go to the basement level for lab work before leaving today. Press B on the elevator. The lab is located at the first door on the left as you exit the elevator.  You have been scheduled for an abdominal ultrasound at [facility] (1st floor of hospital) on 02-03-23 at 10 am. Please arrive15 minutes prior to your appointment for registration. Make certain not to have anything to eat or drink after minight the night prior to your appointment. Should you need to reschedule your appointment, please contact radiology at [phone]. This test typically takes about 30 minutes to perform.  Due to recent changes in healthcare laws, you may see the results of your imaging and laboratory studies on MyChart before your provider has had a chance to review them.  We understand that in some cases there may be results that are confusing or concerning to you. Not all laboratory results come back in the same time frame and the  provider may be waiting for multiple results in order to interpret others.  Please give us  48 hours in order for your provider to thoroughly review all the results before contacting the office for clarification of your results.   It was a pleasure to see you today!  Vito Cirigliano, D.O.

## 2023-01-24 NOTE — Progress Notes (Signed)
 Chief Complaint: Elevated liver enzymes   Referring Provider:    Lonni L. Kate, MD    HPI:     Paula [name] is a 88 y.o. female with a history of paroxysmal A-fib (on Coumadin ), tachybradycardia syndrome  s/p PPM, SVT, HTN, hypothyroidism, hypertrophic cardiomyopathy, referred to the Gastroenterology Clinic for evaluation of elevated liver enzymes.   Follows in the Cardiology Clinic, last seen 12/04/2022.  Due to small apical thrombus despite Eliquis , was changed to Coumadin . Iron and Crestor  stopped ~Sept by PCP due to elevated LAEs, then Amiodarone  dose was decreased in Nov by Cardiologist and she was referred to the GI clinic.    She denies any prior known history of hepatobiliary or pancreatic disease.  No prior history of jaundice, icteric sclera, ascites, encephalopathy.  Hospital admission in 02/2022 with constipation and concern for small bowel obstruction.  Was seen by Dr. Saintclair as an inpatient.  Has a known history of constipation and was take MiraLAX , senna, stool softeners intermittently.  As an inpatient, treated with smog enema, more MiraLAX , with improvement.  Was started on Linzess  290 mcg/day at discharge and resume MiraLAX  as needed.  Unclear if she had follow-up in the outpatient [facility].  Inpatient evaluation also notable mildly elevated AST/ALT in the 50s with otherwise normal ALP and bilirubin.  Normal lipase, TSH.  CT with diffuse increased density throughout the liver which may related to drug therapy such as amiodarone  vs hemochromatosis, no focal liver lesions, GB mildly distended with stone in GB measuring 2.5 cm without pericholecystic inflammation.  CBD 8 mm with mild intrahepatic duct dilation.  Normal-appearing pancreas and no PD dilation.  Constipation now well controlled with Linzess  and Miralax  1 cap/day and maintains loose/soft stool daily.   Does have a history of GERD with index symptoms of intermittent heartburn,  relatively well-controlled with Pepcid  AC at night.  No dysphagia.  Her last colonoscopy was in 2015 with Dr. Vicci, she had 3 to 8 mm sessile serrated adenomas removed from ascending and descending colon.   LAEs started uptrending in 12/2020. Other than 11/2022, ALP has been normal and normal Tbili.  Liver enzymes reviewed and outlined below and notable for the following: - 12/04/2022: AST/ALT 70/66, ALP 127 - 08/28/2022: AST/ALT 115/110, ALP 96 - 06/06/2022: AST/ALT 83/100, ALP 112 - 05/28/2022: AST/ALT 110/108  INR 2.6 (on Coumadin ) CBC normal including PLT 323. CEA mildly elevated 4.8 in 02/2022  Echo 02/11/2022 showed EF 60 to 65%, severe asymmetric LV apical hypertrophy, normal RV function, severe left atrial enlargement, moderate to severe mitral regurgitation, moderate to severe tricuspid regurgitation. 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      Latest Ref Rng & Units 12/04/2022   12:01 PM 06/06/2022   10:47 AM 05/28/2022   11:39 AM  CMP  Glucose 70 - 99 mg/dL 87  85  98   BUN 8 - 27 mg/dL 17  9  11    Creatinine 0.57 - 1.00 mg/dL 8.88  9.09  8.99   Sodium 134 - 144 mmol/L 135  131  132   Potassium 3.5 - 5.2 mmol/L 4.7  4.3  4.1   Chloride 96 -  106 mmol/L 98  92  94   CO2 20 - 29 mmol/L 22  23  25    Calcium  8.7 - 10.3 mg/dL 9.2  9.4  9.4   Total Protein 6.0 - 8.5 g/dL 6.9  6.4  6.9   Total Bilirubin 0.0 - 1.2 mg/dL 0.5  0.4  0.4   Alkaline Phos 44 - 121 IU/L 127  112  115   AST 0 - 40 IU/L 70  83  110   ALT 0 - 32 IU/L 66  100  108       Latest Ref Rng & Units 12/04/2022   12:01 PM 05/28/2022   11:39 AM 03/20/2022    4:18 AM  CBC  WBC 3.4 - 10.8 x10E3/uL 7.9   7.1  11.5   Hemoglobin 11.1 - 15.9 g/dL 87.0  88.1  88.6   Hematocrit 34.0 - 46.6 % 39.6  37.0  34.7   Platelets 150 - 450 x10E3/uL 323  379  328      Past Medical History:  Diagnosis Date   Anemia    Atrial fibrillation (HCC)    CHF (congestive heart failure) (HCC)    DVT (deep venous thrombosis) (HCC)    in her heart   Hyperlipidemia    Hypertension    Hypothyroidism    Irregular heart rate      Past Surgical History:  Procedure Laterality Date   APPENDECTOMY     in early 1970's   PACEMAKER IMPLANT N/A 07/17/2020   Procedure: PACEMAKER IMPLANT;  Surgeon: Waddell Danelle ORN, MD;  Location: [facility] INVASIVE CV LAB;  Service: Cardiovascular;  Laterality: N/A;   salvia gland  2012   Dr Carlie   VAGINAL HYSTERECTOMY     partial   Family History  Problem Relation Age of Onset   Other Mother        heart attack or aneursym   Cancer Father        blood cancer   Breast cancer Sister 59   Colon cancer Neg Hx    Esophageal cancer Neg Hx    Rectal cancer Neg Hx    Social History   Tobacco Use   Smoking status: Never   Smokeless tobacco: Never  Vaping Use   Vaping status: Never Used  Substance Use Topics   Alcohol use: Never    Alcohol/week: 1.0 standard drink of alcohol    Types: 1 Glasses of wine per week   Drug use: Never   Current Outpatient Medications  Medication Sig Dispense Refill   acetaminophen  (TYLENOL ) 325 MG tablet every 4 (four) hours as needed for moderate pain or headache.     amiodarone  (PACERONE ) 100 MG tablet Take 1 tablet (100 mg total) by mouth daily. 90 tablet 3   Calcium  Carb-Cholecalciferol 600-5 MG-MCG TABS daily at 6 (six) AM.     diltiazem  (CARDIZEM  CD) 300 MG 24 hr capsule TAKE 1 CAPSULE EVERY DAY 90 capsule 3   famotidine  (PEPCID  AC) 10 MG tablet as needed for heartburn or indigestion.     furosemide  (LASIX ) 20 MG tablet Take 1 tablet (20 mg total) by mouth daily as needed. 90 tablet 3   levothyroxine  (SYNTHROID , LEVOTHROID) 88 MCG tablet Take  88 mcg by mouth daily before breakfast.     linaclotide  (LINZESS ) 290 MCG CAPS capsule Take 1 capsule (290 mcg total) by mouth daily before breakfast. 30 capsule 0   Multiple Vitamin (MULTIVITAMIN WITH MINERALS) TABS tablet Take 1 tablet by mouth daily.  polyethylene glycol powder (GLYCOLAX /MIRALAX ) 17 GM/SCOOP powder Take 17 g by mouth 2 (two) times daily. 238 g 0   prazosin  (MINIPRESS ) 1 MG capsule TAKE 1 CAPSULE AT BEDTIME 90 capsule 2   sodium chloride  (OCEAN) 0.65 % SOLN nasal spray Place 1 spray into both nostrils as needed for congestion.     valsartan  (DIOVAN ) 160 MG tablet Take 1.5 tablets (240 mg total) by mouth daily. 150 tablet 3   warfarin (COUMADIN ) 1 MG tablet Take 3 tablets to 4 tablets by mouth daily or as directed by Coumadin  Clinic. 285 tablet 1   No current facility-administered medications for this visit.   Allergies  Allergen Reactions   Eliquis  [Apixaban ] Other (See Comments)    Doesn't Work for her   Wound Dressing Adhesive Rash     Review of Systems: All systems reviewed and negative except where noted in HPI.     Physical Exam:    Wt Readings from Last 3 Encounters:  01/24/23 129 lb (58.5 kg)  12/04/22 128 lb 12.8 oz (58.4 kg)  09/12/22 126 lb 6.4 oz (57.3 kg)    BP (!) 150/60   Pulse 60   Ht 5' 2 (1.575 m)   Wt 129 lb (58.5 kg)   BMI 23.59 kg/m  Constitutional:  Pleasant, in no acute distress. Psychiatric: Normal mood and affect. Behavior is normal. EENT: Pupils normal.  Conjunctivae are normal. No scleral icterus. Neck supple. No cervical LAD. Cardiovascular: Normal rate, regular rhythm. No edema Pulmonary/chest: Effort normal and breath sounds normal. No wheezing, rales or rhonchi. Abdominal: Soft, nondistended, nontender. Bowel sounds active throughout. There are no masses palpable. No hepatomegaly. Neurological: Alert and oriented to person place and time. Skin: Skin is warm and dry. No rashes noted.   ASSESSMENT AND PLAN;   1)  Elevated AST/ALT 2) Mildly elevated alkaline phosphatase Discussed full DDx for elevated liver enzymes, to include potential medication effect.  CT in 02/2022 with diffuse increased density throughout the liver which could be related to drug therapy (i.e. amiodarone ) or hemochromatosis, but otherwise no focal liver lesions.  CBD 8 mm with mild intrahepatic duct dilation, but no CDL.  Liver enzymes appear to be downtrending, potentially due to stopping statin a few months ago, and now more recently decreased amiodarone .  Plan for the following:  - Repeat liver enzymes now that she has been off Crestor  and amiodarone  has been reduced - RUQ ultrasound - If liver enzymes uptrending, will plan for extended serologic workup - Labs reviewed and otherwise no serologic evidence of impaired hepatic synthetic function (INR elevated but in the setting of Coumadin )  3) Cholelithiasis CT with 2.5 cm GB stone without cholecystitis and no choledocholithiasis.  Otherwise seems to be asymptomatic from cholelithiasis standpoint - RUQ US  as above  4) History of colon polyps 5) Elevated CEA CEA was mildly elevated at 4.8 during hospital admission in 02/2022 for constipation and SBO.  Does have a history of colon polyps on last colonoscopy in 2015.  Discussed role/utility of colonoscopy.  Based on age, comorbidities, mutually arrived at the following plan: -Repeat CEA now.  If elevated/uptrending, plan for colonoscopy  6) Atrial fibrillation 7) Apical mural thrombus - If planning on any endoscopic procedures, will need Cardiology clearance and Coumadin  hold  RTC in 3 months or sooner as needed   Sandor LULLA Flatter, DO, FACG  01/24/2023, 2:18 PM   Charlott Dorn LABOR, *

## 2023-01-25 LAB — CEA: CEA: 4.5 ng/mL — ABNORMAL HIGH

## 2023-01-29 ENCOUNTER — Telehealth: Payer: Self-pay

## 2023-01-29 ENCOUNTER — Other Ambulatory Visit: Payer: Self-pay

## 2023-01-29 DIAGNOSIS — Z860100 Personal history of colon polyps, unspecified: Secondary | ICD-10-CM

## 2023-01-29 DIAGNOSIS — R970 Elevated carcinoembryonic antigen [CEA]: Secondary | ICD-10-CM

## 2023-01-29 DIAGNOSIS — R7401 Elevation of levels of liver transaminase levels: Secondary | ICD-10-CM

## 2023-01-29 MED ORDER — NA SULFATE-K SULFATE-MG SULF 17.5-3.13-1.6 GM/177ML PO SOLN: 1.0000 | ORAL | 0 refills | Status: DC

## 2023-01-29 NOTE — Telephone Encounter (Addendum)
 [facility] Pre-operative Risk Assessment     Request for surgical clearance:     Endoscopy Procedure  What type of surgery is being performed?     Colonoscopy  When is this surgery scheduled?     03-19-23  What type of clearance is required ?   Pharmacy  Are there any medications that need to be held prior to surgery and how long? Warfarin x 5 days.  Also, will she need Lovenox  bridging?   Practice name and name of physician performing surgery?      [facility]  What is your office phone and fax number?      Phone- [phone]  Fax- [phone]  Anesthesia type (None, local, MAC, general) ?       MAC   Please route your response to Nat Sic, CMA

## 2023-01-29 NOTE — Telephone Encounter (Signed)
 Please advise holding Coumadin prior to Colonoscopy.  Thank you!  DW

## 2023-01-29 NOTE — Addendum Note (Signed)
 Addended by: Richardson Chiquito on: 01/29/2023 03:56 PM   Modules accepted: Orders

## 2023-02-01 NOTE — Telephone Encounter (Signed)
 Patient with diagnosis of atrial fibrillation on warfarin for anticoagulation.    What type of surgery is being performed?     Colonoscopy  When is this surgery scheduled?     03-19-23   CHA2DS2-VASc Score =     This indicates a  % annual risk of stroke. The patient's score is based upon: CHF History: 0 HTN History: 1 Diabetes History: 0 Vascular Disease History: 1 Age Score: 2 Gender Score: 1   Patient also had small apical thrombus noted on cardiac MRI 03/2022  CrCl 34 Platelet count 323  Will review with Dr. Kate to determine if needs lovenox  bridge secondary to apical thrombus and need to hold warfarin for 5 days   **This guidance is not considered finalized until pre-operative APP has relayed final recommendations.**

## 2023-02-02 NOTE — Telephone Encounter (Signed)
 Would recommend bridging with lovenox given her LV thrombus Thanks,Chris

## 2023-02-03 ENCOUNTER — Other Ambulatory Visit: Payer: Medicare HMO

## 2023-02-03 ENCOUNTER — Ambulatory Visit (HOSPITAL_COMMUNITY)
Admission: RE | Admit: 2023-02-03 | Discharge: 2023-02-03 | Disposition: A | Discharge status: Home / Self Care | Payer: Medicare HMO | Source: Ambulatory Visit | Attending: Gastroenterology | Admitting: Gastroenterology

## 2023-02-03 DIAGNOSIS — R970 Elevated carcinoembryonic antigen [CEA]: Secondary | ICD-10-CM | POA: Insufficient documentation

## 2023-02-03 DIAGNOSIS — Z860100 Personal history of colon polyps, unspecified: Secondary | ICD-10-CM | POA: Diagnosis not present

## 2023-02-03 DIAGNOSIS — K8020 Calculus of gallbladder without cholecystitis without obstruction: Secondary | ICD-10-CM | POA: Diagnosis not present

## 2023-02-03 DIAGNOSIS — R7401 Elevation of levels of liver transaminase levels: Secondary | ICD-10-CM | POA: Diagnosis not present

## 2023-02-03 DIAGNOSIS — R7989 Other specified abnormal findings of blood chemistry: Secondary | ICD-10-CM | POA: Diagnosis not present

## 2023-02-03 LAB — IBC + FERRITIN
Ferritin: 37.8 ng/mL (ref 10.0–291.0)
Iron: 123 ug/dL (ref 42–145)
Saturation Ratios: 25 % (ref 20.0–50.0)
TIBC: 491.4 ug/dL — ABNORMAL HIGH (ref 250.0–450.0)
Transferrin: 351 mg/dL (ref 212.0–360.0)

## 2023-02-03 NOTE — Telephone Encounter (Signed)
   Patient Name: Paula [name]  DOB: 01/31/1935 MRN: [id number]  Primary Cardiologist: [name], MD  Chart reviewed as part of pre-operative protocol coverage. Given past medical history and time since last visit, based on ACC/AHA guidelines, Paula [name] is at acceptable risk for the planned procedure without further cardiovascular testing.   Patient with diagnosis of atrial fibrillation on warfarin for anticoagulation.     What type of surgery is being performed?     Colonoscopy  When is this surgery scheduled?     03-19-23    CHA2DS2-VASc Score =     This indicates a  % annual risk of stroke. The patient's score is based upon: CHF History: 0 HTN History: 1 Diabetes History: 0 Vascular Disease History: 1 Age Score: 2 Gender Score: 1   Patient also had small apical thrombus noted on cardiac MRI 03/2022   CrCl 34 Platelet count 323   Will review with Dr. Nanas to determine if needs lovenox  bridge secondary to apical thrombus and need to hold warfarin for 5 days I will route this recommendation to the requesting party via Epic fax function and remove from pre-op pool.  Per Dr. Marlyn: Would recommend bridging with lovenox  given her LV thrombus Thanks,Chris   Per Pharm Coumadin  clinic aware and will arrange bridge  Please call with questions.  Lamarr Satterfield, NP 02/03/2023, 8:52 AM

## 2023-02-03 NOTE — Telephone Encounter (Signed)
 Coumadin clinic aware and will arrange bridge

## 2023-02-04 NOTE — Telephone Encounter (Signed)
 Patient advised that she has been given clearance to hold warfarin 5 days prior to colonoscopy scheduled for 03-19-23.  Patient advised she will need Lovenox  bridging that will be arranged by Coumadin  Clinic.  Patient advised to take last dose of warfarin on 03-13-23, and she will be advised when to restart warfarin by Dr San after the procedure.  Patient agreed to plan and verbalized understanding.  No further questions.

## 2023-02-05 ENCOUNTER — Encounter: Payer: Self-pay | Admitting: Internal Medicine

## 2023-02-05 ENCOUNTER — Telehealth: Payer: Self-pay | Admitting: *Deleted

## 2023-02-05 ENCOUNTER — Ambulatory Visit: Payer: Medicare HMO | Attending: Internal Medicine | Admitting: Internal Medicine

## 2023-02-05 VITALS — BP 132/62 | HR 62 | Ht 62.0 in | Wt 129.8 lb

## 2023-02-05 DIAGNOSIS — I480 Paroxysmal atrial fibrillation: Secondary | ICD-10-CM

## 2023-02-05 LAB — CUP PACEART INCLINIC DEVICE CHECK
Battery Remaining Longevity: 104 mo
Battery Voltage: 3.02 V
Brady Statistic RA Percent Paced: 59 %
Brady Statistic RV Percent Paced: 0 %
Date Time Interrogation Session: 20250115141800
Implantable Lead Connection Status: 753985
Implantable Lead Connection Status: 753985
Implantable Lead Implant Date: 20220627
Implantable Lead Implant Date: 20220627
Implantable Lead Location: 753859
Implantable Lead Location: 753860
Implantable Pulse Generator Implant Date: 20220627
Lead Channel Impedance Value: 400 Ohm
Lead Channel Impedance Value: 487.5 Ohm
Lead Channel Pacing Threshold Amplitude: 0.75 V
Lead Channel Pacing Threshold Amplitude: 0.75 V
Lead Channel Pacing Threshold Amplitude: 0.75 V
Lead Channel Pacing Threshold Amplitude: 0.75 V
Lead Channel Pacing Threshold Pulse Width: 0.5 ms
Lead Channel Pacing Threshold Pulse Width: 0.5 ms
Lead Channel Pacing Threshold Pulse Width: 0.5 ms
Lead Channel Pacing Threshold Pulse Width: 0.5 ms
Lead Channel Sensing Intrinsic Amplitude: 10.1 mV
Lead Channel Sensing Intrinsic Amplitude: 3 mV
Lead Channel Setting Pacing Amplitude: 2 V
Lead Channel Setting Pacing Amplitude: 2.5 V
Lead Channel Setting Pacing Pulse Width: 0.5 ms
Lead Channel Setting Sensing Sensitivity: 2 mV
Pulse Gen Model: 2272
Pulse Gen Serial Number: 3933448

## 2023-02-05 LAB — IGA: Immunoglobulin A: 307 mg/dL (ref 70–320)

## 2023-02-05 LAB — ANTI-SMOOTH MUSCLE ANTIBODY, IGG: Actin (Smooth Muscle) Antibody (IGG): <20 U (ref <20)

## 2023-02-05 LAB — ANTI-MICROSOMAL ANTIBODY LIVER / KIDNEY: LKM1 Ab: <=20 U (ref <=20.0)

## 2023-02-05 LAB — ANTI-NUCLEAR AB-TITER (ANA TITER): ANA Titer 1: 1:80 {titer} — ABNORMAL HIGH

## 2023-02-05 LAB — ANA: Anti Nuclear Antibody (ANA): POSITIVE — AB

## 2023-02-05 LAB — MITOCHONDRIAL ANTIBODIES: Mitochondrial M2 Ab, IgG: <=20 U (ref <=20.0)

## 2023-02-05 LAB — IGG: IgG (Immunoglobin G), Serum: 1173 mg/dL (ref 600–1540)

## 2023-02-05 LAB — IGM: IgM, Serum: 294 mg/dL (ref 50–300)

## 2023-02-05 NOTE — Telephone Encounter (Signed)
 Received a voicemail in the Anticoagulation Clinic from the pt to call her back. There was no answer and no voicemail came on. Will try back later.

## 2023-02-05 NOTE — Patient Instructions (Signed)
 Medication Instructions:  Your physician recommends that you continue on your current medications as directed. Please refer to the Current Medication list given to you today.  *If you need a refill on your cardiac medications before your next appointment, please call your pharmacy*  Lab Work: None ordered.  If you have labs (blood work) drawn today and your tests are completely normal, you will receive your results only by: MyChart Message (if you have MyChart) OR A paper copy in the mail If you have any lab test that is abnormal or we need to change your treatment, we will call you to review the results.  Testing/Procedures: None ordered.  Follow-Up: At [facility], you and your health needs are our priority.  As part of our continuing mission to provide you with exceptional heart care, we have created designated Provider Care Teams.  These Care Teams include your primary Cardiologist (physician) and Advanced Practice Providers (APPs -  Physician Assistants and Nurse Practitioners) who all work together to provide you with the care you need, when you need it.   Your next appointment:   1 year(s)  The format for your next appointment:   In Person  Provider:   Lewayne Bunting, MD{or one of the following Advanced Practice Providers on your designated Care Team:   Francis Dowse, [place] Casimiro Needle "Mardelle Matte" [place], [place] Earnest Rosier, NP  Remote monitoring is used to monitor your Pacemaker/ ICD from home. This monitoring reduces the number of office visits required to check your device to one time per year. It allows [place] to keep an eye on the functioning of your device to ensure it is working properly.   Important Information About Sugar

## 2023-02-05 NOTE — Progress Notes (Signed)
 HPI Paula [name] is a 88 y.o. female with apical hypertrophic cardiomyopathy, paroxysmal atrial fibrillation, hypertension, hypothyroidism who was found to have near syncope due to long sinus pauses and underwent DDD PM insertion. She feels much better. No syncope, chest pain or sob. She still gets tired easily and admits to being sedentary.  Allergies  Allergen Reactions   Eliquis  [Apixaban ] Other (See Comments)    Doesn't Work for her   Wound Dressing Adhesive Rash     Current Outpatient Medications  Medication Sig Dispense Refill   acetaminophen  (TYLENOL ) 325 MG tablet every 4 (four) hours as needed for moderate pain or headache.     amiodarone  (PACERONE ) 100 MG tablet Take 1 tablet (100 mg total) by mouth daily. 90 tablet 3   Calcium  Carb-Cholecalciferol 600-5 MG-MCG TABS daily at 6 (six) AM.     diltiazem  (CARDIZEM  CD) 300 MG 24 hr capsule TAKE 1 CAPSULE EVERY DAY 90 capsule 3   famotidine  (PEPCID  AC) 10 MG tablet as needed for heartburn or indigestion.     furosemide  (LASIX ) 20 MG tablet Take 1 tablet (20 mg total) by mouth daily as needed. 90 tablet 3   levothyroxine  (SYNTHROID , LEVOTHROID) 88 MCG tablet Take 88 mcg by mouth daily before breakfast.     linaclotide  (LINZESS ) 290 MCG CAPS capsule Take 1 capsule (290 mcg total) by mouth daily before breakfast. 30 capsule 0   Multiple Vitamin (MULTIVITAMIN WITH MINERALS) TABS tablet Take 1 tablet by mouth daily.     Na Sulfate-K Sulfate-Mg Sulf (SUPREP BOWEL PREP KIT) 17.5-3.13-1.6 GM/177ML SOLN Take 1 kit by mouth as directed. 324 mL 0   polyethylene glycol powder (GLYCOLAX /MIRALAX ) 17 GM/SCOOP powder Take 17 g by mouth 2 (two) times daily. 238 g 0   prazosin  (MINIPRESS ) 1 MG capsule TAKE 1 CAPSULE AT BEDTIME 90 capsule 2   sodium chloride  (OCEAN) 0.65 % SOLN nasal spray Place 1 spray into both nostrils as needed for congestion.     valsartan  (DIOVAN ) 160 MG tablet Take 1.5 tablets (240 mg total) by mouth daily. 150  tablet 3   warfarin (COUMADIN ) 1 MG tablet Take 3 tablets to 4 tablets by mouth daily or as directed by Coumadin  Clinic. 285 tablet 1   No current facility-administered medications for this visit.     Past Medical History:  Diagnosis Date   Anemia    Atrial fibrillation (HCC)    CHF (congestive heart failure) (HCC)    DVT (deep venous thrombosis) (HCC)    in her heart   Hyperlipidemia    Hypertension    Hypothyroidism    Irregular heart rate     ROS:   All systems reviewed and negative except as noted in the HPI.   Past Surgical History:  Procedure Laterality Date   APPENDECTOMY     in early 1970's   PACEMAKER IMPLANT N/A 07/17/2020   Procedure: PACEMAKER IMPLANT;  Surgeon: Tammie Fall, MD;  Location: [facility] INVASIVE CV LAB;  Service: Cardiovascular;  Laterality: N/A;   salvia gland  2012   Dr Tellis Feathers   VAGINAL HYSTERECTOMY     partial     Family History  Problem Relation Age of Onset   Other Mother        heart attack or aneursym   Cancer Father        blood cancer   Breast cancer Sister 61   Colon cancer Neg Hx    Esophageal cancer Neg Hx  Rectal cancer Neg Hx      Social History   Socioeconomic History   Marital status: Married    Spouse name: Allayne Arabian   Number of children: 1   Years of education: Not on file   Highest education level: Not on file  Occupational History   Not on file  Tobacco Use   Smoking status: Never   Smokeless tobacco: Never  Vaping Use   Vaping status: Never Used  Substance and Sexual Activity   Alcohol use: Never    Alcohol/week: 1.0 standard drink of alcohol    Types: 1 Glasses of wine per week   Drug use: Never   Sexual activity: Not Currently    Birth control/protection: Post-menopausal  Other Topics Concern   Not on file  Social History Narrative   Not on file   Social Drivers of Health   Financial Resource Strain: Not on file  Food Insecurity: Not on file  Transportation Needs: Not on file  Physical  Activity: Not on file  Stress: Not on file  Social Connections: Not on file  Intimate Partner Violence: Not on file     BP 132/62 (BP Location: Right Arm, Patient Position: Sitting, Cuff Size: Normal)   Pulse 62   Ht 5\' 2"  (1.575 m)   Wt 129 lb 12.8 oz (58.9 kg)   SpO2 97%   BMI 23.74 kg/m   Physical Exam:  Well appearing NAD HEENT: Unremarkable Neck:  No JVD, no thyromegally Lymphatics:  No adenopathy Back:  No CVA tenderness Lungs:  Clear with no wheezes HEART:  Regular rate rhythm, no murmurs, no rubs, no clicks Abd:  soft, positive bowel sounds, no organomegally, no rebound, no guarding Ext:  2 plus pulses, no edema, no cyanosis, no clubbing Skin:  No rashes no nodules Neuro:  CN II through XII intact, motor grossly intact  DEVICE  Normal device function.  See PaceArt for details.   Assess/Plan:  Near syncope - this has resolved since her DDD PM was inserted.    2.  Atrial fibrillation - she will remain on amiodarone  but she has reduced her dose to 100 mg dailty. 3.  Apical variant hypertrophic cardiomyopathy - she is stable with essentially no symptoms.    4. Coags - she has had a stroke and her eliquis  was stopped and she was started on warfarin.  Pete Brand Jabrea Kallstrom,MD

## 2023-02-10 ENCOUNTER — Other Ambulatory Visit: Payer: Self-pay

## 2023-02-10 DIAGNOSIS — R7401 Elevation of levels of liver transaminase levels: Secondary | ICD-10-CM

## 2023-02-12 ENCOUNTER — Ambulatory Visit: Payer: Medicare HMO | Attending: Cardiology

## 2023-02-12 DIAGNOSIS — D6869 Other thrombophilia: Secondary | ICD-10-CM | POA: Diagnosis not present

## 2023-02-12 DIAGNOSIS — M8580 Other specified disorders of bone density and structure, unspecified site: Secondary | ICD-10-CM | POA: Diagnosis not present

## 2023-02-12 DIAGNOSIS — Z5181 Encounter for therapeutic drug level monitoring: Secondary | ICD-10-CM

## 2023-02-12 DIAGNOSIS — E039 Hypothyroidism, unspecified: Secondary | ICD-10-CM | POA: Diagnosis not present

## 2023-02-12 DIAGNOSIS — I10 Essential (primary) hypertension: Secondary | ICD-10-CM | POA: Diagnosis not present

## 2023-02-12 DIAGNOSIS — I700 Atherosclerosis of aorta: Secondary | ICD-10-CM | POA: Diagnosis not present

## 2023-02-12 DIAGNOSIS — I480 Paroxysmal atrial fibrillation: Secondary | ICD-10-CM

## 2023-02-12 DIAGNOSIS — K219 Gastro-esophageal reflux disease without esophagitis: Secondary | ICD-10-CM | POA: Diagnosis not present

## 2023-02-12 DIAGNOSIS — K5901 Slow transit constipation: Secondary | ICD-10-CM | POA: Diagnosis not present

## 2023-02-12 DIAGNOSIS — I422 Other hypertrophic cardiomyopathy: Secondary | ICD-10-CM | POA: Diagnosis not present

## 2023-02-12 LAB — POCT INR: INR: 2.2 (ref 2.0–3.0)

## 2023-02-12 MED ORDER — ENOXAPARIN SODIUM 60 MG/0.6ML IJ SOSY: 60.0000 mg | PREFILLED_SYRINGE | Freq: Two times a day (BID) | INTRAMUSCULAR | 1 refills | Status: DC

## 2023-02-12 NOTE — Patient Instructions (Addendum)
Description   Continue taking warfarin 3 tablets (3mg ) daily except 4 tablets (4mg ) on Sundays.  Recheck INR 1 week post procedure.  LET us KNOW WHEN YOU RUN OUT OF THE 1MG  TABLETS, CAN USE DIFFERENT STRENGTH.  Please call the Coumadin Clinic at [phone] with any questions      2/20: Last dose of warfarin.  2/21: No warfarin or enoxaparin (Lovenox).  2/22: Inject enoxaparin 60mg  in the fatty abdominal tissue at least 2 inches from the belly button twice a day about 12 hours apart, 8am and 8pm rotate sites. No warfarin.  2/23: Inject enoxaparin in the fatty tissue every 12 hours, 8am and 8pm. No warfarin.  2/24: Inject enoxaparin in the fatty tissue every 12 hours, 8am and 8pm. No warfarin.  2/25: Inject enoxaparin in the fatty tissue in the morning at 8 am (No PM dose). No warfarin.  2/26: Procedure Day - No enoxaparin - Resume warfarin in the evening or as directed by doctor - take an extra half tablet of Warfarin with usual dose  2/27: Resume enoxaparin inject in the fatty tissue every 12 hours and take warfarin - take an extra half tablet of Warfarin with usual dose  2/28: Inject enoxaparin in the fatty tissue every 12 hours and take warfarin  3/1: Inject enoxaparin in the fatty tissue every 12 hours and take warfarin  3/2: Inject enoxaparin in the fatty tissue every 12 hours and take warfarin  3/3: Inject enoxaparin in the fatty tissue every 12 hours and take warfarin  3/4: Inject enoxaparin in the fatty tissue every 12 hours and take warfarin  3/5:  Inject enoxaparin in the fatty tissue and come to warfarin appt to check INR.

## 2023-02-13 ENCOUNTER — Ambulatory Visit: Payer: Medicare HMO

## 2023-02-14 LAB — ALKALINE PHOSPHATASE, ISOENZYMES
Alkaline Phosphatase: 119 [IU]/L (ref 44–121)
BONE FRACTION: 64 % (ref 14–68)
INTESTINAL FRAC.: 1 % (ref 0–18)
LIVER FRACTION: 35 % (ref 18–85)

## 2023-02-21 ENCOUNTER — Ambulatory Visit
Admission: RE | Admit: 2023-02-21 | Discharge: 2023-02-21 | Disposition: A | Discharge status: Home / Self Care | Payer: Medicare HMO | Source: Ambulatory Visit | Attending: Internal Medicine | Admitting: Internal Medicine

## 2023-02-21 DIAGNOSIS — Z1231 Encounter for screening mammogram for malignant neoplasm of breast: Secondary | ICD-10-CM | POA: Diagnosis not present

## 2023-02-21 DIAGNOSIS — M8580 Other specified disorders of bone density and structure, unspecified site: Secondary | ICD-10-CM

## 2023-02-21 DIAGNOSIS — N958 Other specified menopausal and perimenopausal disorders: Secondary | ICD-10-CM | POA: Diagnosis not present

## 2023-02-21 DIAGNOSIS — E2839 Other primary ovarian failure: Secondary | ICD-10-CM | POA: Diagnosis not present

## 2023-02-21 DIAGNOSIS — M8588 Other specified disorders of bone density and structure, other site: Secondary | ICD-10-CM | POA: Diagnosis not present

## 2023-02-28 ENCOUNTER — Other Ambulatory Visit: Payer: Self-pay

## 2023-02-28 DIAGNOSIS — I480 Paroxysmal atrial fibrillation: Secondary | ICD-10-CM

## 2023-02-28 MED ORDER — WARFARIN SODIUM 1 MG PO TABS: ORAL_TABLET | ORAL | 1 refills | Status: DC

## 2023-03-10 ENCOUNTER — Telehealth: Payer: Self-pay | Admitting: Gastroenterology

## 2023-03-10 ENCOUNTER — Encounter: Payer: Self-pay | Admitting: Gastroenterology

## 2023-03-10 ENCOUNTER — Other Ambulatory Visit (INDEPENDENT_AMBULATORY_CARE_PROVIDER_SITE_OTHER): Payer: Medicare HMO

## 2023-03-10 DIAGNOSIS — R7401 Elevation of levels of liver transaminase levels: Secondary | ICD-10-CM

## 2023-03-10 LAB — HEPATIC FUNCTION PANEL
ALT: 51 U/L — ABNORMAL HIGH (ref 0–35)
AST: 56 U/L — ABNORMAL HIGH (ref 0–37)
Albumin: 3.9 g/dL (ref 3.5–5.2)
Alkaline Phosphatase: 86 U/L (ref 39–117)
Bilirubin, Direct: 0.1 mg/dL (ref 0.0–0.3)
Total Bilirubin: 0.5 mg/dL (ref 0.2–1.2)
Total Protein: 6.9 g/dL (ref 6.0–8.3)

## 2023-03-10 NOTE — Telephone Encounter (Signed)
Inbound call to discuss if patient is supposed to have rescheduled an appointment. Requesting call back

## 2023-03-10 NOTE — Telephone Encounter (Signed)
Returned call to the patient who explained she canceled her appointment for 03-12-23 due to inclement weather concerns.  Patient advised that we would contact her with lab results from today and she should stay on the scheduled for next week 03-19-23 for colonoscopy.  Patient advised that Dr Barron Alvine will advise if further follow up is needed after the procedure.  Patient agreed to plan and verbalized understanding.

## 2023-03-11 ENCOUNTER — Encounter: Payer: Self-pay | Admitting: Gastroenterology

## 2023-03-12 ENCOUNTER — Ambulatory Visit: Payer: Medicare HMO | Admitting: Gastroenterology

## 2023-03-13 ENCOUNTER — Other Ambulatory Visit: Payer: Self-pay | Admitting: Cardiology

## 2023-03-13 DIAGNOSIS — I480 Paroxysmal atrial fibrillation: Secondary | ICD-10-CM

## 2023-03-19 ENCOUNTER — Ambulatory Visit: Payer: Medicare HMO | Admitting: Gastroenterology

## 2023-03-19 ENCOUNTER — Encounter: Payer: Self-pay | Admitting: Gastroenterology

## 2023-03-19 VITALS — BP 172/87 | HR 64 | Temp 98.3°F | Resp 19 | Ht 62.0 in | Wt 129.0 lb

## 2023-03-19 DIAGNOSIS — D125 Benign neoplasm of sigmoid colon: Secondary | ICD-10-CM

## 2023-03-19 DIAGNOSIS — Z1211 Encounter for screening for malignant neoplasm of colon: Secondary | ICD-10-CM

## 2023-03-19 DIAGNOSIS — K648 Other hemorrhoids: Secondary | ICD-10-CM

## 2023-03-19 DIAGNOSIS — R970 Elevated carcinoembryonic antigen [CEA]: Secondary | ICD-10-CM | POA: Diagnosis not present

## 2023-03-19 DIAGNOSIS — K642 Third degree hemorrhoids: Secondary | ICD-10-CM | POA: Diagnosis not present

## 2023-03-19 DIAGNOSIS — Z860101 Personal history of adenomatous and serrated colon polyps: Secondary | ICD-10-CM | POA: Diagnosis not present

## 2023-03-19 DIAGNOSIS — K5730 Diverticulosis of large intestine without perforation or abscess without bleeding: Secondary | ICD-10-CM

## 2023-03-19 DIAGNOSIS — D122 Benign neoplasm of ascending colon: Secondary | ICD-10-CM

## 2023-03-19 DIAGNOSIS — Z860100 Personal history of colon polyps, unspecified: Secondary | ICD-10-CM | POA: Diagnosis not present

## 2023-03-19 DIAGNOSIS — Q439 Congenital malformation of intestine, unspecified: Secondary | ICD-10-CM | POA: Diagnosis not present

## 2023-03-19 DIAGNOSIS — R7401 Elevation of levels of liver transaminase levels: Secondary | ICD-10-CM

## 2023-03-19 DIAGNOSIS — I509 Heart failure, unspecified: Secondary | ICD-10-CM | POA: Diagnosis not present

## 2023-03-19 DIAGNOSIS — I4891 Unspecified atrial fibrillation: Secondary | ICD-10-CM | POA: Diagnosis not present

## 2023-03-19 MED ORDER — SODIUM CHLORIDE 0.9 % IV SOLN
500.0000 mL | INTRAVENOUS | Status: AC
Start: 2023-03-19 — End: 2023-03-20

## 2023-03-19 NOTE — Progress Notes (Signed)
 Called to room to assist during endoscopic procedure.  Patient ID and intended procedure confirmed with present staff. Received instructions for my participation in the procedure from the performing physician.

## 2023-03-19 NOTE — Progress Notes (Signed)
 Pt's states no medical or surgical changes since previsit or office visit.

## 2023-03-19 NOTE — Progress Notes (Signed)
 GASTROENTEROLOGY PROCEDURE H&P NOTE   Primary Care Physician: Emilio Aspen, MD    Reason for Procedure:   Elevated CEA, hx of colon polyps  Plan:    Colonoscopy  Patient is appropriate for endoscopic procedure(s) in the ambulatory ([facility]) setting.  The nature of the procedure, as well as the risks, benefits, and alternatives were carefully and thoroughly reviewed with the patient. Ample time for discussion and questions allowed. The patient understood, was satisfied, and agreed to proceed.     HPI: Paula [name] is a 88 y.o. female who presents for colonoscopy for evaluation of elevated CEA level and hx of colon polyps.   Her last colonoscopy was in 2015 with Dr. 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   Holding Coumadin for procedure today.   Past Medical History:  Diagnosis Date   Anemia    Atrial fibrillation (HCC)    CHF (congestive heart failure) (HCC)    DVT (deep venous thrombosis) (HCC)    in her heart   Hyperlipidemia    Hypertension    Hypothyroidism    Irregular heart rate     Past Surgical History:  Procedure Laterality Date   APPENDECTOMY     in early 1970's   PACEMAKER IMPLANT N/A 07/17/2020   Procedure: PACEMAKER IMPLANT;  Surgeon: Marinus Maw, MD;  Location: [facility];  Service: Cardiovascular;  Laterality: N/A;   salvia gland  2012   Dr Jenne Pane   VAGINAL HYSTERECTOMY     partial    Prior to Admission medications   Medication Sig Start Date End Date Taking? Authorizing Provider  acetaminophen (TYLENOL) 325 MG tablet every 4 (four) hours as needed for moderate pain or headache.   Yes [provider]  amiodarone (PACERONE) 100 MG tablet TAKE 1 TABLET EVERY DAY 03/13/23  Yes Little Ishikawa, MD  Calcium Carb-Cholecalciferol 600-5 MG-MCG TABS daily at 6 (six) AM.   Yes [provider]  diltiazem (CARDIZEM CD) 300 MG 24 hr capsule TAKE 1  CAPSULE EVERY DAY 07/09/22  Yes Little Ishikawa, MD  enoxaparin (LOVENOX) 60 MG/0.6ML injection Inject 0.6 mLs (60 mg total) into the skin every 12 (twelve) hours. 02/12/23  Yes Little Ishikawa, MD  famotidine (PEPCID AC) 10 MG tablet as needed for heartburn or indigestion.   Yes [provider]  levothyroxine (SYNTHROID, LEVOTHROID) 88 MCG tablet Take 88 mcg by mouth daily before breakfast.   Yes [provider]  linaclotide (LINZESS) 290 MCG CAPS capsule Take 1 capsule (290 mcg total) by mouth daily before breakfast. 03/21/22  Yes Marinda Elk, MD  Multiple Vitamin (MULTIVITAMIN WITH MINERALS) TABS tablet Take 1 tablet by mouth daily.   Yes [provider]  polyethylene glycol powder (GLYCOLAX/MIRALAX) 17 GM/SCOOP powder Take 17 g by mouth 2 (two) times daily. 03/20/22  Yes Marinda Elk, MD  prazosin (MINIPRESS) 1 MG capsule TAKE 1 CAPSULE AT BEDTIME 09/10/22  Yes Little Ishikawa, MD  sodium chloride (OCEAN) 0.65 % SOLN nasal spray Place 1 spray into both nostrils as needed for congestion.   Yes [provider]  valsartan (DIOVAN) 160 MG tablet Take 1.5 tablets (240 mg total) by mouth daily. 05/30/22  Yes Little Ishikawa, MD  furosemide (LASIX) 20 MG tablet Take 1 tablet (20 mg total) by mouth daily as needed. 07/03/22   Little Ishikawa, MD  warfarin (COUMADIN) 1 MG tablet Take 3  tablets to 4 tablets by mouth daily or as directed by Coumadin Clinic. 02/28/23   Lanier Prude, MD    Current Outpatient Medications  Medication Sig Dispense Refill   acetaminophen (TYLENOL) 325 MG tablet every 4 (four) hours as needed for moderate pain or headache.     amiodarone (PACERONE) 100 MG tablet TAKE 1 TABLET EVERY DAY 90 tablet 2   Calcium Carb-Cholecalciferol 600-5 MG-MCG TABS daily at 6 (six) AM.     diltiazem (CARDIZEM CD) 300 MG 24 hr capsule TAKE 1 CAPSULE EVERY DAY 90 capsule 3   enoxaparin (LOVENOX) 60 MG/0.6ML  injection Inject 0.6 mLs (60 mg total) into the skin every 12 (twelve) hours. 12 mL 1   famotidine (PEPCID AC) 10 MG tablet as needed for heartburn or indigestion.     levothyroxine (SYNTHROID, LEVOTHROID) 88 MCG tablet Take 88 mcg by mouth daily before breakfast.     linaclotide (LINZESS) 290 MCG CAPS capsule Take 1 capsule (290 mcg total) by mouth daily before breakfast. 30 capsule 0   Multiple Vitamin (MULTIVITAMIN WITH MINERALS) TABS tablet Take 1 tablet by mouth daily.     polyethylene glycol powder (GLYCOLAX/MIRALAX) 17 GM/SCOOP powder Take 17 g by mouth 2 (two) times daily. 238 g 0   prazosin (MINIPRESS) 1 MG capsule TAKE 1 CAPSULE AT BEDTIME 90 capsule 2   sodium chloride (OCEAN) 0.65 % SOLN nasal spray Place 1 spray into both nostrils as needed for congestion.     valsartan (DIOVAN) 160 MG tablet Take 1.5 tablets (240 mg total) by mouth daily. 150 tablet 3   furosemide (LASIX) 20 MG tablet Take 1 tablet (20 mg total) by mouth daily as needed. 90 tablet 3   warfarin (COUMADIN) 1 MG tablet Take 3 tablets to 4 tablets by mouth daily or as directed by Coumadin Clinic. 100 tablet 1   Current Facility-Administered Medications  Medication Dose Route Frequency Provider Last Rate Last Admin   0.9 %  sodium chloride infusion  500 mL Intravenous Continuous Vincen Bejar V, DO        Allergies as of 03/19/2023 - Review Complete 03/19/2023  Allergen Reaction Noted   Eliquis [apixaban] Other (See Comments) 01/24/2023   Wound dressing adhesive Rash     Family History  Problem Relation Age of Onset   Other Mother        heart attack or aneursym   Cancer Father        blood cancer   Breast cancer Sister 46   Colon cancer Neg Hx    Esophageal cancer Neg Hx    Rectal cancer Neg Hx     Social History   Socioeconomic History   Marital status: Married    Spouse name: Reita Cliche   Number of children: 1   Years of education: Not on file   Highest education level: Not on file  Occupational  History   Not on file  Tobacco Use   Smoking status: Never   Smokeless tobacco: Never  Vaping Use   Vaping status: Never Used  Substance and Sexual Activity   Alcohol use: Never    Alcohol/week: 1.0 standard drink of alcohol    Types: 1 Glasses of wine per week   Drug use: Never   Sexual activity: Not Currently    Birth control/protection: Post-menopausal  Other Topics Concern   Not on file  Social History Narrative   Not on file   Social Drivers of Health   Financial Resource Strain: Not on  file  Food Insecurity: Not on file  Transportation Needs: Not on file  Physical Activity: Not on file  Stress: Not on file  Social Connections: Not on file  Intimate Partner Violence: Not on file    Physical Exam: Vital signs in last 24 hours: @BP  (!) 192/82   Pulse 65   Temp 98.3 F (36.8 C) (Temporal)   Ht 5\' 2"  (1.575 m)   Wt 129 lb (58.5 kg)   SpO2 99%   BMI 23.59 kg/m  GEN: NAD EYE: Sclerae anicteric ENT: MMM CV: Non-tachycardic Pulm: CTA b/l GI: Soft, NT/ND NEURO:  Alert & Oriented x 3   Doristine Locks, DO [facility]   03/19/2023 2:46 PM

## 2023-03-19 NOTE — Patient Instructions (Signed)
 YOU HAD AN ENDOSCOPIC PROCEDURE TODAY AT THE [facility]:   Refer to the procedure report that was given to you for any specific questions about what was found during the examination.  If the procedure report does not answer your questions, please call your gastroenterologist to clarify.  If you requested that your care partner not be given the details of your procedure findings, then the procedure report has been included in a sealed envelope for you to review at your convenience later.  YOU SHOULD EXPECT: Some feelings of bloating in the abdomen. Passage of more gas than usual.  Walking can help get rid of the air that was put into your GI tract during the procedure and reduce the bloating. If you had a lower endoscopy (such as a colonoscopy or flexible sigmoidoscopy) you may notice spotting of blood in your stool or on the toilet paper. If you underwent a bowel prep for your procedure, you may not have a normal bowel movement for a few days.  Please Note:  You might notice some irritation and congestion in your nose or some drainage.  This is from the oxygen used during your procedure.  There is no need for concern and it should clear up in a day or so.  SYMPTOMS TO REPORT IMMEDIATELY:  Following lower endoscopy (colonoscopy or flexible sigmoidoscopy):  Excessive amounts of blood in the stool  Significant tenderness or worsening of abdominal pains  Swelling of the abdomen that is new, acute  Fever of 100F or higher   For urgent or emergent issues, a gastroenterologist can be reached at any hour by calling [phone]. Do not use MyChart messaging for urgent concerns.    DIET:  We do recommend a small meal at first, but then you may proceed to your regular diet.  Drink plenty of fluids but you should avoid alcoholic beverages for 24 hours.  MEDICATIONS: Continue present medications. Resume Coumadin today as directed by the Coumadin Clinic.  FOLLOW UP: Await pathology  results. Repeat colonoscopy is not recommended due to current age (6 years or older) for surveillance purposes.  Return to Dr. Frankey Shown office as needed.  Please see handouts given to you by your recovery nurse: Polyps, Diverticulosis, Hemorrhoids.  Thank you for allowing [place] to provide for your healthcare needs today.  ACTIVITY:  You should plan to take it easy for the rest of today and you should NOT DRIVE or use heavy machinery until tomorrow (because of the sedation medicines used during the test).    FOLLOW UP: Our staff will call the number listed on your records the next business day following your procedure.  We will call around 7:15- 8:00 am to check on you and address any questions or concerns that you may have regarding the information given to you following your procedure. If we do not reach you, we will leave a message.     If any biopsies were taken you will be contacted by phone or by letter within the next 1-3 weeks.  Please call us at [phone] if you have not heard about the biopsies in 3 weeks.    SIGNATURES/CONFIDENTIALITY: You and/or your care partner have signed paperwork which will be entered into your electronic medical record.  These signatures attest to the fact that that the information above on your After Visit Summary has been reviewed and is understood.  Full responsibility of the confidentiality of this discharge information lies with you and/or your care-partner.

## 2023-03-19 NOTE — Progress Notes (Signed)
 Sedate, gd SR, tolerated procedure well, VSS, report to RN

## 2023-03-19 NOTE — Op Note (Signed)
 [facility] Patient Name: Paula [name] Procedure Date: 03/19/2023 2:47 PM MRN: [id number] Endoscopist: Doristine Locks , MD, [id number] Age: 88 Referring MD:  Date of Birth: [birth year] Gender: Female Account #: [id number] Procedure:                Colonoscopy Indications:              Elevated CEA level, Personal history of colonic                            polyps                           Her last colonoscopy was in 2015 with Dr. Laural Benes,                            she had 3 to 8 mm sessile serrated adenomas removed                            from ascending and descending colon. Medicines:                Monitored Anesthesia Care Procedure:                Pre-Anesthesia Assessment:                           - Prior to the procedure, a History and Physical                            was performed, and patient medications and                            allergies were reviewed. The patient's tolerance of                            previous anesthesia was also reviewed. The risks                            and benefits of the procedure and the sedation                            options and risks were discussed with the patient.                            All questions were answered, and informed consent                            was obtained. Prior Anticoagulants: The patient has                            taken Coumadin (warfarin), last dose was 5 days                            prior to procedure. ASA Grade Assessment: III - A  patient with severe systemic disease. After                            reviewing the risks and benefits, the patient was                            deemed in satisfactory condition to undergo the                            procedure.                           After obtaining informed consent, the colonoscope                            was passed under direct vision. Throughout the                            procedure,  the patient's blood pressure, pulse, and                            oxygen saturations were monitored continuously. The                            Olympus Scope [id number] was introduced through the                            anus and advanced to the the cecum, identified by                            appendiceal orifice and ileocecal valve. The                            colonoscopy was technically difficult and complex                            due to restricted mobility of the colon and a                            tortuous colon. Successful completion of the                            procedure was aided by applying abdominal pressure.                            The patient tolerated the procedure well. The                            quality of the bowel preparation was good. The                            ileocecal valve, appendiceal orifice, and rectum  were photographed. Scope In: 3:01:39 PM Scope Out: 3:27:17 PM Scope Withdrawal Time: 0 hours 14 minutes 59 seconds  Total Procedure Duration: 0 hours 25 minutes 38 seconds  Findings:                 Hemorrhoids were found on perianal exam.                           A 4 mm polyp was found in the ascending colon. The                            polyp was sessile. The polyp was removed with a                            cold snare. Resection and retrieval were complete.                            Estimated blood loss was minimal.                           Two sessile polyps were found in the sigmoid colon.                            The polyps were 2 to 3 mm in size. These polyps                            were removed with a cold snare. Resection and                            retrieval were complete. Estimated blood loss was                            minimal.                           Multiple small-mouthed diverticula were found in                            the sigmoid colon.                           The  sigmoid colon was significantly tortuous with                            restricted mobility and mucosal hypertrophy in an                            area of dense diverticulosis. Advancing the scope                            required using manual pressure.                           Non-bleeding internal hemorrhoids were found during  retroflexion. The hemorrhoids were small and Grade                            III (internal hemorrhoids that prolapse but require                            manual reduction). Complications:            No immediate complications. Estimated Blood Loss:     Estimated blood loss was minimal. Impression:               - Hemorrhoids found on perianal exam.                           - One 4 mm polyp in the ascending colon, removed                            with a cold snare. Resected and retrieved.                           - Two 2 to 3 mm polyps in the sigmoid colon,                            removed with a cold snare. Resected and retrieved.                           - Diverticulosis in the sigmoid colon.                           - Tortuous colon.                           - Non-bleeding internal hemorrhoids. Recommendation:           - Patient has a contact number available for                            emergencies. The signs and symptoms of potential                            delayed complications were discussed with the                            patient. Return to normal activities tomorrow.                            Written discharge instructions were provided to the                            patient.                           - Resume previous diet.                           - Continue present medications.                           -  Await pathology results.                           - Repeat colonoscopy is not recommended due to                            current age (62 years or older) for surveillance.                            - Return to GI clinic PRN.                           - Resume Coumadin today as directed by the Coumadin                            Clinic. Doristine Locks, MD 03/19/2023 3:34:50 PM

## 2023-03-20 ENCOUNTER — Telehealth: Payer: Self-pay | Admitting: *Deleted

## 2023-03-20 NOTE — Telephone Encounter (Signed)
  Follow up Call-     03/19/2023    1:51 PM 04/24/2022   12:30 PM  Call back number  Post procedure Call Back phone  # [phone]  Permission to leave phone message Yes      Patient questions:  Do you have a fever, pain , or abdominal swelling? No. Pain Score  0 *  Have you tolerated food without any problems? Yes.    Have you been able to return to your normal activities? Yes.    Do you have any questions about your discharge instructions: Diet   No. Medications  No. Follow up visit  No.  Do you have questions or concerns about your Care? No.  Actions: * If pain score is 4 or above: No action needed, pain <4.

## 2023-03-24 ENCOUNTER — Encounter: Payer: Self-pay | Admitting: Gastroenterology

## 2023-03-24 LAB — SURGICAL PATHOLOGY

## 2023-03-26 ENCOUNTER — Ambulatory Visit: Payer: Medicare HMO | Attending: Cardiology

## 2023-03-26 DIAGNOSIS — I480 Paroxysmal atrial fibrillation: Secondary | ICD-10-CM

## 2023-03-26 DIAGNOSIS — Z5181 Encounter for therapeutic drug level monitoring: Secondary | ICD-10-CM

## 2023-03-26 LAB — POCT INR: INR: 1.3 — AB (ref 2.0–3.0)

## 2023-03-26 NOTE — Patient Instructions (Signed)
 Description   Continue Lovenox injections this week.  Take 4 tablets today and 4 tablets tomorrow and then continue taking warfarin 3 tablets (3mg ) daily except 4 tablets (4mg ) on Sundays.  Recheck INR 1 week.  LET us KNOW WHEN YOU RUN OUT OF THE 1MG  TABLETS, CAN USE DIFFERENT STRENGTH.  Please call the Coumadin Clinic at [phone] with any questions

## 2023-03-27 DIAGNOSIS — K5901 Slow transit constipation: Secondary | ICD-10-CM | POA: Diagnosis not present

## 2023-03-27 DIAGNOSIS — M8000XD Age-related osteoporosis with current pathological fracture, unspecified site, subsequent encounter for fracture with routine healing: Secondary | ICD-10-CM | POA: Diagnosis not present

## 2023-03-27 DIAGNOSIS — E785 Hyperlipidemia, unspecified: Secondary | ICD-10-CM | POA: Diagnosis not present

## 2023-03-27 DIAGNOSIS — D6869 Other thrombophilia: Secondary | ICD-10-CM | POA: Diagnosis not present

## 2023-03-27 DIAGNOSIS — I480 Paroxysmal atrial fibrillation: Secondary | ICD-10-CM | POA: Diagnosis not present

## 2023-03-27 DIAGNOSIS — R748 Abnormal levels of other serum enzymes: Secondary | ICD-10-CM | POA: Diagnosis not present

## 2023-03-27 DIAGNOSIS — I10 Essential (primary) hypertension: Secondary | ICD-10-CM | POA: Diagnosis not present

## 2023-03-31 ENCOUNTER — Ambulatory Visit: Attending: Cardiology | Admitting: *Deleted

## 2023-03-31 DIAGNOSIS — I480 Paroxysmal atrial fibrillation: Secondary | ICD-10-CM | POA: Diagnosis not present

## 2023-03-31 DIAGNOSIS — Z5181 Encounter for therapeutic drug level monitoring: Secondary | ICD-10-CM | POA: Diagnosis not present

## 2023-03-31 LAB — POCT INR: POC INR: 1.5

## 2023-03-31 NOTE — Patient Instructions (Addendum)
 Description   Continue Lovenox injections this week.  Take 6 tablets today (6mg ) and 4 tablets (4mg ) tablets tomorrow and then continue taking warfarin 3 tablets (3mg ) daily except 4 tablets (4mg ) on Sundays.  Recheck INR  4 days.  LET us KNOW WHEN YOU RUN OUT OF THE 1MG  TABLETS, CAN USE DIFFERENT STRENGTH.  Please call the Coumadin Clinic at [phone] with any questions

## 2023-04-03 ENCOUNTER — Ambulatory Visit: Admitting: *Deleted

## 2023-04-03 ENCOUNTER — Ambulatory Visit (HOSPITAL_COMMUNITY): Payer: Medicare HMO | Attending: Internal Medicine

## 2023-04-03 DIAGNOSIS — Z5181 Encounter for therapeutic drug level monitoring: Secondary | ICD-10-CM

## 2023-04-03 DIAGNOSIS — I340 Nonrheumatic mitral (valve) insufficiency: Secondary | ICD-10-CM | POA: Insufficient documentation

## 2023-04-03 DIAGNOSIS — I480 Paroxysmal atrial fibrillation: Secondary | ICD-10-CM | POA: Diagnosis not present

## 2023-04-03 LAB — ECHOCARDIOGRAM COMPLETE
AR max vel: 1.19 cm2
AV Area VTI: 1.11 cm2
AV Area mean vel: 1.1 cm2
AV Mean grad: 9 mmHg
AV Peak grad: 17.7 mmHg
Ao pk vel: 2.11 m/s
Area-P 1/2: 3.39 cm2
S' Lateral: 3.4 cm

## 2023-04-03 LAB — POCT INR: POC INR: 1.8

## 2023-04-03 MED ORDER — PERFLUTREN LIPID MICROSPHERE
3.0000 mL | INTRAVENOUS | Status: AC | PRN
  Administered 2023-04-03: 3 mL via INTRAVENOUS

## 2023-04-03 NOTE — Patient Instructions (Signed)
 Description   Continue Lovenox injections for the next 3 days.  Take 5mg  of warfarin today.  Then START taking warfarin 3 tablets (3mg ) daily except 4 tablets (4mg ) on Sundays, Tuesdays and Thursdays Recheck INR  in 1 week.  LET us KNOW WHEN YOU RUN OUT OF THE 1MG  TABLETS, CAN USE DIFFERENT STRENGTH.  Please call the Coumadin Clinic at [phone] with any questions

## 2023-04-05 ENCOUNTER — Other Ambulatory Visit: Payer: Self-pay | Admitting: Cardiology

## 2023-04-07 ENCOUNTER — Encounter: Payer: Self-pay | Admitting: *Deleted

## 2023-04-08 NOTE — Progress Notes (Unsigned)
 Cardiology Office Note:    Date:  04/09/2023   ID:  Paula [name], DOB Jun 20, 1935, MRN [id number]  PCP:  Emilio Aspen, MD  Cardiologist:  Little Ishikawa, MD  Electrophysiologist:  Lanier Prude, MD   Referring MD: Emilio Aspen, *   Chief Complaint  Patient presents with   Atrial Fibrillation    History of Present Illness:    Paula [name] is a 88 y.o. female with a hx of paroxysmal atrial fibrillation, tachybrady syndrome status post PPM, hypertension, hypothyroidism who presents for follow-up.  She was referred by Alphonzo Severance, PA for evaluation of hypertrophic cardiomyopathy, initially seen on 06/14/2019.  Diagnosed with atrial fibrillation after presenting with palpitations to PCP office in April 2021. EKG showed A. fib with RVR. Started on Eliquis given CHA2DS2-VASc score 5.  Also started on diltiazem for rate control. She was seen in AF clinic on 05/11/2019, she was noted to be in normal sinus rhythm. She was continued on Eliquis 5 mg twice daily and diltiazem 180 mg daily. TTE on 05/27/19 was concerning for apical variant hypertrophic cardiomyopathy, EF 60 to 65%, normal RV function, moderate elevation in RVSP (48 mmHg), mild mitral regurgitation, mild to moderate tricuspid regurgitation.  Cardiac MRI on 07/05/2019 showed LV apical hypertrophy measuring up to 15 mm, consistent with apical hypertrophic cardiomyopathy, small apical aneurysm, patchy apical LGE accounting for 1% of total myocardial mass, hyperdynamic LV systolic function, normal RV function, mild MR (regurgitant fraction 24%).  Zio patch x7 days on 07/14/2019 showed AF burden 47%, with episode lasting 3 days and average rate 118 bpm, no NSVT, frequent episodes of SVT, longest lasting 19 seconds.  She was admitted in June 2022 with near syncope, underwent PPM for tachybrady syndrome.  Echo 02/11/2022 showed EF 60 to 65%, severe asymmetric LV apical hypertrophy, normal RV function, severe  left atrial enlargement, moderate to severe mitral regurgitation, moderate to severe tricuspid regurgitation.  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  Echocardiogram 03/2023 shows EF 60 to 65%, normal RV function, RVSP 61 mmHg, severe left atrial enlargement, moderate MR, severe TR, mild aortic stenosis, RAP 3.  Since last clinic visit, she reports she is doing well.  She has been doing chair exercise class.  Denies any chest pain or dyspnea.  Does have some balance issues and reports some lightheadedness in the morning but denies any syncope.  Denies any lower extremity edema.  She has not had to take any Lasix.  Wt Readings from Last 3 Encounters:  04/09/23 130 lb 3.2 oz (59.1 kg)  03/19/23 129 lb (58.5 kg)  02/05/23 129 lb 12.8 oz (58.9 kg)   BP Readings from Last 3 Encounters:  04/09/23 122/68  03/19/23 (!) 172/87  02/05/23 132/62     Past Medical History:  Diagnosis Date   Anemia    Atrial fibrillation (HCC)    CHF (congestive heart failure) (HCC)    DVT (deep venous thrombosis) (HCC)    in her heart   Hyperlipidemia    Hypertension    Hypothyroidism    Irregular heart rate     Past Surgical History:  Procedure Laterality Date   APPENDECTOMY     in early 1970's   PACEMAKER IMPLANT  N/A 07/17/2020   Procedure: PACEMAKER IMPLANT;  Surgeon: Marinus Maw, MD;  Location: [facility] INVASIVE CV LAB;  Service: Cardiovascular;  Laterality: N/A;   salvia gland  2012   Dr Jenne Pane   VAGINAL HYSTERECTOMY     partial    Current Medications: Current Meds  Medication Sig   acetaminophen (TYLENOL) 325 MG tablet every 4 (four) hours as needed for moderate pain or headache.    amiodarone (PACERONE) 100 MG tablet TAKE 1 TABLET EVERY DAY   Calcium Carb-Cholecalciferol 600-5 MG-MCG TABS daily at 6 (six) AM. Patient takes this Medication in the afternoon not 6am   diltiazem (CARDIZEM CD) 300 MG 24 hr capsule TAKE 1 CAPSULE EVERY DAY   famotidine (PEPCID AC) 10 MG tablet as needed for heartburn or indigestion.   furosemide (LASIX) 20 MG tablet Take 1 tablet (20 mg total) by mouth daily as needed.   levothyroxine (SYNTHROID, LEVOTHROID) 88 MCG tablet Take 88 mcg by mouth daily before breakfast.   linaclotide (LINZESS) 290 MCG CAPS capsule Take 1 capsule (290 mcg total) by mouth daily before breakfast.   Multiple Vitamin (MULTIVITAMIN WITH MINERALS) TABS tablet Take 1 tablet by mouth daily.   polyethylene glycol powder (GLYCOLAX/MIRALAX) 17 GM/SCOOP powder Take 17 g by mouth 2 (two) times daily.   prazosin (MINIPRESS) 1 MG capsule TAKE 1 CAPSULE AT BEDTIME   sodium chloride (OCEAN) 0.65 % SOLN nasal spray Place 1 spray into both nostrils as needed for congestion.   valsartan (DIOVAN) 160 MG tablet TAKE 1 AND 1/2 TABLETS EVERY DAY   warfarin (COUMADIN) 1 MG tablet Take 3 tablets to 4 tablets by mouth daily or as directed by Coumadin Clinic.     Allergies:   Eliquis [apixaban] and Wound dressing adhesive   Social History   Socioeconomic History   Marital status: Married    Spouse name: Reita Cliche   Number of children: 1   Years of education: Not on file   Highest education level: Not on file  Occupational History   Not on file  Tobacco Use   Smoking status: Never   Smokeless tobacco: Never  Vaping Use   Vaping status: Never Used  Substance and Sexual Activity   Alcohol use: Never    Alcohol/week: 1.0 standard drink of alcohol    Types: 1 Glasses of wine per week   Drug use: Never   Sexual activity: Not Currently    Partners: Male    Birth control/protection: Post-menopausal    Comment: married  Other Topics Concern   Not on file  Social History Narrative    Not on file   Social Drivers of Health   Financial Resource Strain: Not on file  Food Insecurity: Not on file  Transportation Needs: Not on file  Physical Activity: Not on file  Stress: Not on file  Social Connections: Not on file     Family History: The patient's family history includes Breast cancer (age of onset: 4) in her sister; Cancer in her father; Other in her mother. There is no history of Colon cancer, Esophageal cancer, or Rectal cancer.  ROS:   Please see the history of present illness.     All other systems reviewed and are negative.  EKGs/Labs/Other Studies Reviewed:    The following studies were reviewed today:  Monitor 05/01/2020: No significant abnormalities   Patch Wear Time:  2 days and 23 hours (2022-04-01T16:38:40-0400 to 2022-04-04T16:10:58-0400)   Patient had a min HR of 35 bpm, max HR of 119  bpm, and avg HR of 61 bpm. Predominant underlying rhythm was Sinus Rhythm. 2 Supraventricular Tachycardia runs occurred, the run with the fastest interval lasting 4 beats with a max rate of 119 bpm, the longest lasting 4 beats with an avg rate of 98 bpm. Isolated SVEs were rare (<1.0%), SVE Couplets were rare (<1.0%), and SVE Triplets were rare (<1.0%). Isolated VEs were rare (<1.0%), and no VE Couplets or VE Triplets were present.  No patient triggered events  Monitor 07/14/2019: Episode of atrial fibrillation lasting 3 days 6 hours, average rate 118 bpm. AF burden 47%. 141 episodes of SVT, longest lasting 18.5 seconds.   7 days of data recorded on Zio monitor. Patient had a min HR of 55 bpm, max HR of 207 bpm, and avg HR of 95 bpm. Predominant underlying rhythm was Sinus Rhythm. No VT, high degree block, or pauses noted.  141 episodes of SVT, longest lasting 18.5 seconds.  Episode of atrial fibrillation lasting 3 days 6 hours, average rate 118 bpm.  AF burden 47%.   Isolated atrial and ventricular ectopy was rare (<1%). There were 0 triggered events.  CMR  07/05/2019: 1. LV hypertrophy at apex measuring up to 15mm, consistent with apical hypertrophic cardiomyopathy   2.  Small apical aneurysm   3. Patchy LGE at apex, consistent with apical HCM. LGE accounts for 1% of total myocardial mass   4.  Normal LV size with hyperdynamic systolic function (EF 70%)   5.  Normal RV size and systolic function (EF 58%)   6. Mild mitral regurgitation (regurgitant volume 16cc, regurgitant fraction 24%)  Echo 05/27/2019: 1. Apical variant hypertrophic cardiomyopathy with no evidence of apical  thrombus. Left ventricular ejection fraction, by estimation, is 60 to 65%.  The left ventricle has normal function. The left ventricle demonstrates  regional wall motion  abnormalities (see scoring diagram/findings for description). There is  severe left ventricular hypertrophy of the apical segment. Left  ventricular diastolic parameters are consistent with Grade I diastolic  dysfunction (impaired relaxation).   2. Right ventricular systolic function is normal. The right ventricular  size is normal. There is moderately elevated pulmonary artery systolic  pressure. The estimated right ventricular systolic pressure is 47.9 mmHg.   3. Left atrial size was mildly dilated.   4. The mitral valve is normal in structure. Mild mitral valve  regurgitation. No evidence of mitral stenosis.   5. Tricuspid valve regurgitation is mild to moderate.   6. Possible fusion of right and non coronary cusp. The aortic valve is  normal in structure. Aortic valve regurgitation is not visualized. Mild to  moderate aortic valve sclerosis/calcification is present, without any  evidence of aortic stenosis.   7. The inferior vena cava is normal in size with greater than 50%  respiratory variability, suggesting right atrial pressure of 3 mmHg.  Exercise Myoview 07/12/2016: Nuclear stress EF: 64%. Blood pressure demonstrated a normal response to exercise. Horizontal ST segment depression  ST segment depression was noted during stress in the III, aVF, V4, V5 and V6 leads, and returning to baseline after 1-5 minutes of recovery. Findings are nonspecific given baseline T wave inversions. The study is normal. This is a low risk study. The left ventricular ejection fraction is normal (55-65%).   EKG:   09/12/2022: Atrial paced rhythm, rate 60, poor R wave progression, nonspecific T wave flattening 01/08/2022: Atrial paced, rate 60, poor R wave progression, nonspecific T wave flattening 07/04/2021: Atrial paced, rate 60, Q waves in V1/2, nonspecific  T wave flattening 12/27/20: Atrial paced, rate 60, Q waves in V1/2, no ST abnormalities 07/10/2020: EKG is not ordered today. 04/14/2020: sinus rhythm, rate 74, LVH with repolarization abnormalities  Recent Labs: 05/28/2022: BNP 154.5; Magnesium 2.2 12/04/2022: BUN 17; Creatinine, Ser 1.11; Hemoglobin 12.9; Platelets 323; Potassium 4.7; Sodium 135 03/10/2023: ALT 51  Recent Lipid Panel    Component Value Date/Time   CHOL 207 (H) 12/04/2022 1201   TRIG 72 12/04/2022 1201   HDL 70 12/04/2022 1201   CHOLHDL 3.0 12/04/2022 1201   LDLCALC 124 (H) 12/04/2022 1201    Physical Exam:    VS:  BP 122/68   Pulse 67   Ht 5\' 2"  (1.575 m)   Wt 130 lb 3.2 oz (59.1 kg)   SpO2 98%   BMI 23.81 kg/m     Wt Readings from Last 3 Encounters:  04/09/23 130 lb 3.2 oz (59.1 kg)  03/19/23 129 lb (58.5 kg)  02/05/23 129 lb 12.8 oz (58.9 kg)     GEN: in no acute distress HEENT: Normal NECK: No JVD; No carotid bruits CARDIAC: RRR, 2/6 systolic murmur RESPIRATORY:  Clear to auscultation without rales, wheezing or rhonchi  ABDOMEN: Soft, non-tender, non-distended MUSCULOSKELETAL:  No edema; No deformity  SKIN: Warm and dry NEUROLOGIC:  Alert and oriented x 3 PSYCHIATRIC:  Normal affect   ASSESSMENT:    1. Hypertrophic cardiomyopathy (HCC)   2. LV (left ventricular) mural thrombus   3. Mitral valve insufficiency, unspecified etiology   4.  Tricuspid valve insufficiency, unspecified etiology   5. Paroxysmal atrial fibrillation (HCC)   6. Hypertension, unspecified type      PLAN:    Hypertrophic cardiomyopathy: cardiac MRI on 07/05/2019 showed LV apical hypertrophy measuring up to 15 mm, consistent with apical hypertrophic cardiomyopathy, small apical aneurysm, patchy apical LGE accounting for 1% of total myocardial mass.  No NSVT on Zio patch x 7 days.  Echo 02/11/2022 showed EF 60 to 65%, severe asymmetric LV apical hypertrophy, normal RV function, severe left atrial enlargement, moderate to severe mitral regurgitation, moderate to severe tricuspid regurgitation.  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  This was developed despite being on Eliquis.  Recommended switching to warfarin -Follows in Coumadin clinic for INR management  Mitral regurgitation/tricuspid regurgitation: Moderate to severe MR and moderate TR on CMR 03/2022.  Echo 03/2023 showed moderate MR, severe TR.  Will monitor -Takes Lasix 20 mg daily as needed.  Monitor daily weights and take as needed if gains more than 3 pounds in 1 day or 5 pounds in 1 week.  Appears euvolemic in clinic today, reports has not needed Lasix recently  Paroxysmal atrial fibrillation: Diagnosed 04/2019 after presenting to PCPs office with palpitations. CHA2DS2-VASc score 5.  Zio patch x7 days on 07/14/2019 showed AF burden 47%, with episode lasting 3 days and average rate 118 bpm.  She was  admitted in June 2022 with near syncope, underwent PPM for tachybrady syndrome. -Continue warfarin, follows in Coumadin clinic -Was on amiodarone 200 mg daily.  Transaminitis noted on labs, dose reduced to 100 mg  daily.   -Continue diltiazem 300 mg daily  SVT: Frequent episodes on ZIO monitoring, longest lasting 19 seconds.  Continue diltiazem  Hypertension: On valsartan 240 mg mg daily, prazosin 1 mg nightly, diltiazem 300 mg daily.  BP appears controlled  Hyperlipidemia: Has been on rosuvastatin 5 mg daily, but held recently due to transaminitis.  LDL 124 on 12/04/2022  Transaminitis: elevation in liver enzymes noted.  Amiodarone dose decreased to 100 mg daily.  Rosuvastatin discontinued.  Liver ultrasound unremarkable.  Referred to hepatology for evaluation   RTC in 6 months  Medication Adjustments/Labs and Tests Ordered: Current medicines are reviewed at length with the patient today.  Concerns regarding medicines are outlined above.  No orders of the defined types were placed in this encounter.   No orders of the defined types were placed in this encounter.     Patient Instructions  Medication Instructions:   No changes  *If you need a refill on your cardiac medications before your next appointment, please call your pharmacy*   Lab Work:  Not needed   Testing/Procedures:  No changes  Follow-Up: At [facility], you and your health needs are our priority.  As part of our continuing mission to provide you with exceptional heart care, we have created designated Provider Care Teams.  These Care Teams include your primary Cardiologist (physician) and Advanced Practice Providers (APPs -  Physician Assistants and Nurse Practitioners) who all work together to provide you with the care you need, when you need it.     Your next appointment:   6 month(s)  The format for your next appointment:   In Person  Provider:   Little Ishikawa, MD      s      Signed, Little Ishikawa, MD  04/09/2023 5:22 PM    [facility]

## 2023-04-09 ENCOUNTER — Ambulatory Visit (INDEPENDENT_AMBULATORY_CARE_PROVIDER_SITE_OTHER)

## 2023-04-09 ENCOUNTER — Encounter: Payer: Self-pay | Admitting: Cardiology

## 2023-04-09 ENCOUNTER — Ambulatory Visit: Payer: Medicare HMO | Attending: Cardiology | Admitting: Cardiology

## 2023-04-09 VITALS — BP 122/68 | HR 67 | Ht 62.0 in | Wt 130.2 lb

## 2023-04-09 DIAGNOSIS — I480 Paroxysmal atrial fibrillation: Secondary | ICD-10-CM

## 2023-04-09 DIAGNOSIS — I071 Rheumatic tricuspid insufficiency: Secondary | ICD-10-CM

## 2023-04-09 DIAGNOSIS — I513 Intracardiac thrombosis, not elsewhere classified: Secondary | ICD-10-CM | POA: Diagnosis not present

## 2023-04-09 DIAGNOSIS — I340 Nonrheumatic mitral (valve) insufficiency: Secondary | ICD-10-CM | POA: Diagnosis not present

## 2023-04-09 DIAGNOSIS — Z5181 Encounter for therapeutic drug level monitoring: Secondary | ICD-10-CM | POA: Diagnosis not present

## 2023-04-09 DIAGNOSIS — I422 Other hypertrophic cardiomyopathy: Secondary | ICD-10-CM | POA: Diagnosis not present

## 2023-04-09 DIAGNOSIS — I10 Essential (primary) hypertension: Secondary | ICD-10-CM | POA: Diagnosis not present

## 2023-04-09 LAB — POCT INR: INR: 1.6 — AB (ref 2.0–3.0)

## 2023-04-09 NOTE — Patient Instructions (Signed)
 Description   Take 6 tablets today and 6 tablets tomorrow and then START taking 4 tablets (4mg ).  Recheck INR  in 1 week.  LET us KNOW WHEN YOU RUN OUT OF THE 1MG  TABLETS, CAN USE DIFFERENT STRENGTH.  Please call the Coumadin Clinic at [phone] with any questions

## 2023-04-09 NOTE — Patient Instructions (Signed)
 Medication Instructions:   No changes  *If you need a refill on your cardiac medications before your next appointment, please call your pharmacy*   Lab Work:  Not needed   Testing/Procedures:  No changes  Follow-Up: At [facility], you and your health needs are our priority.  As part of our continuing mission to provide you with exceptional heart care, we have created designated Provider Care Teams.  These Care Teams include your primary Cardiologist (physician) and Advanced Practice Providers (APPs -  Physician Assistants and Nurse Practitioners) who all work together to provide you with the care you need, when you need it.     Your next appointment:   6 month(s)  The format for your next appointment:   In Person  Provider:   Little Ishikawa, MD      s

## 2023-04-14 ENCOUNTER — Ambulatory Visit: Attending: Cardiology

## 2023-04-14 DIAGNOSIS — Z5181 Encounter for therapeutic drug level monitoring: Secondary | ICD-10-CM | POA: Diagnosis not present

## 2023-04-14 DIAGNOSIS — I480 Paroxysmal atrial fibrillation: Secondary | ICD-10-CM

## 2023-04-14 LAB — POCT INR: INR: 2.4 (ref 2.0–3.0)

## 2023-04-14 NOTE — Patient Instructions (Addendum)
 Description   Continue taking 4 tablets (4mg ).  Recheck INR  in 2 weeks.  LET us KNOW WHEN YOU RUN OUT OF THE 1MG  TABLETS, CAN USE DIFFERENT STRENGTH.  Please call the Coumadin Clinic at [phone] with any questions

## 2023-04-16 ENCOUNTER — Encounter

## 2023-04-17 DIAGNOSIS — M810 Age-related osteoporosis without current pathological fracture: Secondary | ICD-10-CM | POA: Diagnosis not present

## 2023-04-21 ENCOUNTER — Ambulatory Visit: Payer: Medicare HMO

## 2023-04-21 DIAGNOSIS — I480 Paroxysmal atrial fibrillation: Secondary | ICD-10-CM

## 2023-04-21 LAB — CUP PACEART REMOTE DEVICE CHECK
Battery Remaining Longevity: 86 mo
Battery Remaining Percentage: 78 %
Battery Voltage: 3.02 V
Brady Statistic AP VP Percent: 1 %
Brady Statistic AP VS Percent: 79 %
Brady Statistic AS VP Percent: 1 %
Brady Statistic AS VS Percent: 21 %
Brady Statistic RA Percent Paced: 79 %
Brady Statistic RV Percent Paced: 1 %
Date Time Interrogation Session: 20250331020015
Implantable Lead Connection Status: 753985
Implantable Lead Connection Status: 753985
Implantable Lead Implant Date: 20220627
Implantable Lead Implant Date: 20220627
Implantable Lead Location: 753859
Implantable Lead Location: 753860
Implantable Pulse Generator Implant Date: 20220627
Lead Channel Impedance Value: 380 Ohm
Lead Channel Impedance Value: 440 Ohm
Lead Channel Pacing Threshold Amplitude: 0.75 V
Lead Channel Pacing Threshold Amplitude: 0.75 V
Lead Channel Pacing Threshold Pulse Width: 0.5 ms
Lead Channel Pacing Threshold Pulse Width: 0.5 ms
Lead Channel Sensing Intrinsic Amplitude: 3.1 mV
Lead Channel Sensing Intrinsic Amplitude: 7.5 mV
Lead Channel Setting Pacing Amplitude: 2 V
Lead Channel Setting Pacing Amplitude: 2.5 V
Lead Channel Setting Pacing Pulse Width: 0.5 ms
Lead Channel Setting Sensing Sensitivity: 2 mV
Pulse Gen Model: 2272
Pulse Gen Serial Number: 3933448

## 2023-04-24 ENCOUNTER — Ambulatory Visit: Attending: Cardiology | Admitting: *Deleted

## 2023-04-24 DIAGNOSIS — Z5181 Encounter for therapeutic drug level monitoring: Secondary | ICD-10-CM | POA: Diagnosis not present

## 2023-04-24 DIAGNOSIS — I480 Paroxysmal atrial fibrillation: Secondary | ICD-10-CM

## 2023-04-24 LAB — POCT INR: POC INR: 2.6

## 2023-04-24 NOTE — Patient Instructions (Signed)
 Description   Continue taking 4 tablets (4mg ).  Recheck INR  in  3 weeks.  LET us KNOW WHEN YOU RUN OUT OF THE 1MG  TABLETS, CAN USE DIFFERENT STRENGTH.  Please call the Coumadin Clinic at [phone] with any questions

## 2023-04-27 ENCOUNTER — Encounter: Payer: Self-pay | Admitting: Internal Medicine

## 2023-04-27 ENCOUNTER — Other Ambulatory Visit: Payer: Self-pay | Admitting: Cardiology

## 2023-04-28 DIAGNOSIS — L988 Other specified disorders of the skin and subcutaneous tissue: Secondary | ICD-10-CM | POA: Diagnosis not present

## 2023-04-28 DIAGNOSIS — L309 Dermatitis, unspecified: Secondary | ICD-10-CM | POA: Diagnosis not present

## 2023-04-28 DIAGNOSIS — D485 Neoplasm of uncertain behavior of skin: Secondary | ICD-10-CM | POA: Diagnosis not present

## 2023-04-28 DIAGNOSIS — L299 Pruritus, unspecified: Secondary | ICD-10-CM | POA: Diagnosis not present

## 2023-05-12 ENCOUNTER — Other Ambulatory Visit: Payer: Self-pay | Admitting: Cardiology

## 2023-05-15 ENCOUNTER — Other Ambulatory Visit: Payer: Self-pay | Admitting: *Deleted

## 2023-05-15 ENCOUNTER — Ambulatory Visit: Attending: Cardiology | Admitting: *Deleted

## 2023-05-15 DIAGNOSIS — Z5181 Encounter for therapeutic drug level monitoring: Secondary | ICD-10-CM

## 2023-05-15 DIAGNOSIS — I480 Paroxysmal atrial fibrillation: Secondary | ICD-10-CM

## 2023-05-15 DIAGNOSIS — I513 Intracardiac thrombosis, not elsewhere classified: Secondary | ICD-10-CM

## 2023-05-15 LAB — POCT INR: INR: 2.4 (ref 2.0–3.0)

## 2023-05-15 MED ORDER — WARFARIN SODIUM 1 MG PO TABS: ORAL_TABLET | ORAL | 1 refills | Status: DC

## 2023-05-15 NOTE — Patient Instructions (Signed)
 Description   Continue taking 4 tablets (4mg ) daily.  Recheck INR in 4 weeks.  LET US  KNOW WHEN YOU RUN OUT OF THE 1MG  TABLETS, CAN USE DIFFERENT STRENGTH.  Please call the Coumadin  Clinic at [phone] with any questions

## 2023-05-15 NOTE — Telephone Encounter (Signed)
 Warfarin 1mg  refill Afib, Thrombus Last INR 05/15/23 Last OV

## 2023-05-29 DIAGNOSIS — D6869 Other thrombophilia: Secondary | ICD-10-CM | POA: Diagnosis not present

## 2023-05-29 DIAGNOSIS — R21 Rash and other nonspecific skin eruption: Secondary | ICD-10-CM | POA: Diagnosis not present

## 2023-05-29 DIAGNOSIS — I10 Essential (primary) hypertension: Secondary | ICD-10-CM | POA: Diagnosis not present

## 2023-05-29 DIAGNOSIS — M8000XD Age-related osteoporosis with current pathological fracture, unspecified site, subsequent encounter for fracture with routine healing: Secondary | ICD-10-CM | POA: Diagnosis not present

## 2023-05-29 DIAGNOSIS — I480 Paroxysmal atrial fibrillation: Secondary | ICD-10-CM | POA: Diagnosis not present

## 2023-06-05 NOTE — Progress Notes (Signed)
 Remote pacemaker transmission.

## 2023-06-12 ENCOUNTER — Ambulatory Visit: Attending: Cardiology | Admitting: *Deleted

## 2023-06-12 DIAGNOSIS — I480 Paroxysmal atrial fibrillation: Secondary | ICD-10-CM

## 2023-06-12 DIAGNOSIS — Z5181 Encounter for therapeutic drug level monitoring: Secondary | ICD-10-CM | POA: Diagnosis not present

## 2023-06-12 LAB — POCT INR: POC INR: 2.3

## 2023-06-12 NOTE — Patient Instructions (Signed)
 Description   Continue taking 4 tablets (4mg ) daily.  Recheck INR in 5 weeks.  LET US  KNOW WHEN YOU RUN OUT OF THE 1MG  TABLETS, CAN USE DIFFERENT STRENGTH.  Please call the Coumadin  Clinic at [phone] with any questions

## 2023-07-07 DIAGNOSIS — N1831 Chronic kidney disease, stage 3a: Secondary | ICD-10-CM | POA: Diagnosis not present

## 2023-07-07 DIAGNOSIS — I10 Essential (primary) hypertension: Secondary | ICD-10-CM | POA: Diagnosis not present

## 2023-07-07 DIAGNOSIS — I480 Paroxysmal atrial fibrillation: Secondary | ICD-10-CM | POA: Diagnosis not present

## 2023-07-08 DIAGNOSIS — I480 Paroxysmal atrial fibrillation: Secondary | ICD-10-CM | POA: Diagnosis not present

## 2023-07-08 DIAGNOSIS — I10 Essential (primary) hypertension: Secondary | ICD-10-CM | POA: Diagnosis not present

## 2023-07-08 DIAGNOSIS — N1831 Chronic kidney disease, stage 3a: Secondary | ICD-10-CM | POA: Diagnosis not present

## 2023-07-17 ENCOUNTER — Ambulatory Visit: Attending: Cardiology

## 2023-07-17 DIAGNOSIS — Z5181 Encounter for therapeutic drug level monitoring: Secondary | ICD-10-CM | POA: Diagnosis not present

## 2023-07-17 DIAGNOSIS — I480 Paroxysmal atrial fibrillation: Secondary | ICD-10-CM

## 2023-07-17 LAB — POCT INR: INR: 1.8 — AB (ref 2.0–3.0)

## 2023-07-17 NOTE — Patient Instructions (Signed)
 Description   Take an extra 1/2 tablet today and then continue taking 4 tablets (4mg ) daily.  Recheck INR in 5 weeks.  LET US  KNOW WHEN YOU RUN OUT OF THE 1MG  TABLETS, CAN USE DIFFERENT STRENGTH.  Please call the Coumadin  Clinic at [phone] with any questions

## 2023-07-17 NOTE — Progress Notes (Signed)
Please see anticoagulation encounter.

## 2023-07-18 NOTE — Progress Notes (Signed)
 Anticoag encounter

## 2023-07-21 ENCOUNTER — Ambulatory Visit: Payer: Self-pay | Admitting: Internal Medicine

## 2023-07-21 DIAGNOSIS — N1831 Chronic kidney disease, stage 3a: Secondary | ICD-10-CM | POA: Diagnosis not present

## 2023-07-21 DIAGNOSIS — E785 Hyperlipidemia, unspecified: Secondary | ICD-10-CM | POA: Diagnosis not present

## 2023-07-21 DIAGNOSIS — I10 Essential (primary) hypertension: Secondary | ICD-10-CM | POA: Diagnosis not present

## 2023-07-21 DIAGNOSIS — I480 Paroxysmal atrial fibrillation: Secondary | ICD-10-CM | POA: Diagnosis not present

## 2023-07-21 DIAGNOSIS — E039 Hypothyroidism, unspecified: Secondary | ICD-10-CM | POA: Diagnosis not present

## 2023-07-21 LAB — CUP PACEART REMOTE DEVICE CHECK
Battery Remaining Longevity: 84 mo
Battery Remaining Percentage: 76 %
Battery Voltage: 3.02 V
Brady Statistic AP VP Percent: 1 %
Brady Statistic AP VS Percent: 75 %
Brady Statistic AS VP Percent: 1 %
Brady Statistic AS VS Percent: 25 %
Brady Statistic RA Percent Paced: 75 %
Brady Statistic RV Percent Paced: 1 %
Date Time Interrogation Session: 20250630020018
Implantable Lead Connection Status: 753985
Implantable Lead Connection Status: 753985
Implantable Lead Implant Date: 20220627
Implantable Lead Implant Date: 20220627
Implantable Lead Location: 753859
Implantable Lead Location: 753860
Implantable Pulse Generator Implant Date: 20220627
Lead Channel Impedance Value: 380 Ohm
Lead Channel Impedance Value: 440 Ohm
Lead Channel Pacing Threshold Amplitude: 0.75 V
Lead Channel Pacing Threshold Amplitude: 0.75 V
Lead Channel Pacing Threshold Pulse Width: 0.5 ms
Lead Channel Pacing Threshold Pulse Width: 0.5 ms
Lead Channel Sensing Intrinsic Amplitude: 2.9 mV
Lead Channel Sensing Intrinsic Amplitude: 8.5 mV
Lead Channel Setting Pacing Amplitude: 2 V
Lead Channel Setting Pacing Amplitude: 2.5 V
Lead Channel Setting Pacing Pulse Width: 0.5 ms
Lead Channel Setting Sensing Sensitivity: 2 mV
Pulse Gen Model: 2272
Pulse Gen Serial Number: 3933448

## 2023-07-24 DIAGNOSIS — H35363 Drusen (degenerative) of macula, bilateral: Secondary | ICD-10-CM | POA: Diagnosis not present

## 2023-07-24 DIAGNOSIS — H18593 Other hereditary corneal dystrophies, bilateral: Secondary | ICD-10-CM | POA: Diagnosis not present

## 2023-07-24 DIAGNOSIS — Z961 Presence of intraocular lens: Secondary | ICD-10-CM | POA: Diagnosis not present

## 2023-07-24 DIAGNOSIS — H4422 Degenerative myopia, left eye: Secondary | ICD-10-CM | POA: Diagnosis not present

## 2023-07-24 DIAGNOSIS — H524 Presbyopia: Secondary | ICD-10-CM | POA: Diagnosis not present

## 2023-07-24 DIAGNOSIS — H52223 Regular astigmatism, bilateral: Secondary | ICD-10-CM | POA: Diagnosis not present

## 2023-07-24 DIAGNOSIS — H26491 Other secondary cataract, right eye: Secondary | ICD-10-CM | POA: Diagnosis not present

## 2023-08-05 DIAGNOSIS — N1831 Chronic kidney disease, stage 3a: Secondary | ICD-10-CM | POA: Diagnosis not present

## 2023-08-05 DIAGNOSIS — I10 Essential (primary) hypertension: Secondary | ICD-10-CM | POA: Diagnosis not present

## 2023-08-05 DIAGNOSIS — I480 Paroxysmal atrial fibrillation: Secondary | ICD-10-CM | POA: Diagnosis not present

## 2023-08-14 DIAGNOSIS — I422 Other hypertrophic cardiomyopathy: Secondary | ICD-10-CM | POA: Diagnosis not present

## 2023-08-14 DIAGNOSIS — K219 Gastro-esophageal reflux disease without esophagitis: Secondary | ICD-10-CM | POA: Diagnosis not present

## 2023-08-14 DIAGNOSIS — Z79899 Other long term (current) drug therapy: Secondary | ICD-10-CM | POA: Diagnosis not present

## 2023-08-14 DIAGNOSIS — Z0000 Encounter for general adult medical examination without abnormal findings: Secondary | ICD-10-CM | POA: Diagnosis not present

## 2023-08-14 DIAGNOSIS — I10 Essential (primary) hypertension: Secondary | ICD-10-CM | POA: Diagnosis not present

## 2023-08-14 DIAGNOSIS — I480 Paroxysmal atrial fibrillation: Secondary | ICD-10-CM | POA: Diagnosis not present

## 2023-08-14 DIAGNOSIS — Z1331 Encounter for screening for depression: Secondary | ICD-10-CM | POA: Diagnosis not present

## 2023-08-14 DIAGNOSIS — R21 Rash and other nonspecific skin eruption: Secondary | ICD-10-CM | POA: Diagnosis not present

## 2023-08-14 DIAGNOSIS — M8000XD Age-related osteoporosis with current pathological fracture, unspecified site, subsequent encounter for fracture with routine healing: Secondary | ICD-10-CM | POA: Diagnosis not present

## 2023-08-14 DIAGNOSIS — K5901 Slow transit constipation: Secondary | ICD-10-CM | POA: Diagnosis not present

## 2023-08-14 DIAGNOSIS — N1831 Chronic kidney disease, stage 3a: Secondary | ICD-10-CM | POA: Diagnosis not present

## 2023-08-14 DIAGNOSIS — E039 Hypothyroidism, unspecified: Secondary | ICD-10-CM | POA: Diagnosis not present

## 2023-08-14 DIAGNOSIS — D6869 Other thrombophilia: Secondary | ICD-10-CM | POA: Diagnosis not present

## 2023-08-21 ENCOUNTER — Ambulatory Visit: Attending: Cardiology

## 2023-08-21 DIAGNOSIS — I10 Essential (primary) hypertension: Secondary | ICD-10-CM | POA: Diagnosis not present

## 2023-08-21 DIAGNOSIS — N1831 Chronic kidney disease, stage 3a: Secondary | ICD-10-CM | POA: Diagnosis not present

## 2023-08-21 DIAGNOSIS — I513 Intracardiac thrombosis, not elsewhere classified: Secondary | ICD-10-CM | POA: Diagnosis not present

## 2023-08-21 DIAGNOSIS — E039 Hypothyroidism, unspecified: Secondary | ICD-10-CM | POA: Diagnosis not present

## 2023-08-21 DIAGNOSIS — Z5181 Encounter for therapeutic drug level monitoring: Secondary | ICD-10-CM | POA: Diagnosis not present

## 2023-08-21 DIAGNOSIS — I480 Paroxysmal atrial fibrillation: Secondary | ICD-10-CM | POA: Diagnosis not present

## 2023-08-21 DIAGNOSIS — E785 Hyperlipidemia, unspecified: Secondary | ICD-10-CM | POA: Diagnosis not present

## 2023-08-21 LAB — POCT INR: INR: 1.9 — AB (ref 2.0–3.0)

## 2023-08-21 MED ORDER — WARFARIN SODIUM 4 MG PO TABS: ORAL_TABLET | ORAL | 0 refills | Status: DC

## 2023-08-21 NOTE — Patient Instructions (Addendum)
 Description   Take 6 tablets today (6mg ),  then continue taking 4 tablets (4mg ) daily.  Recheck INR in 5 weeks.  New prescription sent into pharmacy for 4mg  tablets will need to change on dosage calendar next visit.  Please call the Coumadin  Clinic at [phone] with any questions   Once you complete the 1mg  tablets,  start taking the 4mg  tablet 1 tablet daily.

## 2023-08-21 NOTE — Progress Notes (Signed)
 INR 1.9; Please see anticoagulation encounter

## 2023-09-02 DIAGNOSIS — L570 Actinic keratosis: Secondary | ICD-10-CM | POA: Diagnosis not present

## 2023-09-02 DIAGNOSIS — L821 Other seborrheic keratosis: Secondary | ICD-10-CM | POA: Diagnosis not present

## 2023-09-02 DIAGNOSIS — L814 Other melanin hyperpigmentation: Secondary | ICD-10-CM | POA: Diagnosis not present

## 2023-09-02 DIAGNOSIS — Z85828 Personal history of other malignant neoplasm of skin: Secondary | ICD-10-CM | POA: Diagnosis not present

## 2023-09-04 DIAGNOSIS — N1831 Chronic kidney disease, stage 3a: Secondary | ICD-10-CM | POA: Diagnosis not present

## 2023-09-04 DIAGNOSIS — I10 Essential (primary) hypertension: Secondary | ICD-10-CM | POA: Diagnosis not present

## 2023-09-04 DIAGNOSIS — I480 Paroxysmal atrial fibrillation: Secondary | ICD-10-CM | POA: Diagnosis not present

## 2023-09-21 DIAGNOSIS — E785 Hyperlipidemia, unspecified: Secondary | ICD-10-CM | POA: Diagnosis not present

## 2023-09-21 DIAGNOSIS — I480 Paroxysmal atrial fibrillation: Secondary | ICD-10-CM | POA: Diagnosis not present

## 2023-09-21 DIAGNOSIS — N1831 Chronic kidney disease, stage 3a: Secondary | ICD-10-CM | POA: Diagnosis not present

## 2023-09-21 DIAGNOSIS — I10 Essential (primary) hypertension: Secondary | ICD-10-CM | POA: Diagnosis not present

## 2023-09-21 DIAGNOSIS — E039 Hypothyroidism, unspecified: Secondary | ICD-10-CM | POA: Diagnosis not present

## 2023-09-24 IMAGING — MG MM DIGITAL SCREENING BILAT W/ TOMO AND CAD
8 series · 9 of 24 positions shown · non-contrast
Comparison: Previous exam(s).

CLINICAL DATA: Screening.

EXAM:
DIGITAL SCREENING BILATERAL MAMMOGRAM WITH TOMOSYNTHESIS AND CAD
TECHNIQUE: Bilateral screening digital craniocaudal and mediolateral oblique
mammograms were obtained. Bilateral screening digital breast
tomosynthesis was performed. The images were evaluated with
computer-aided detection.

[R MLO synth-2D]
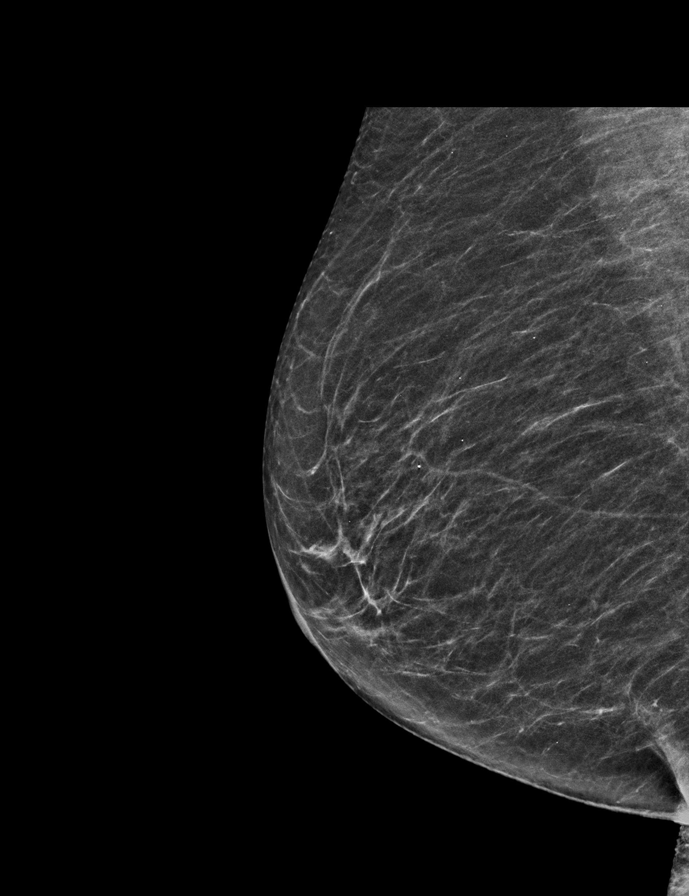

[R CC synth-2D]
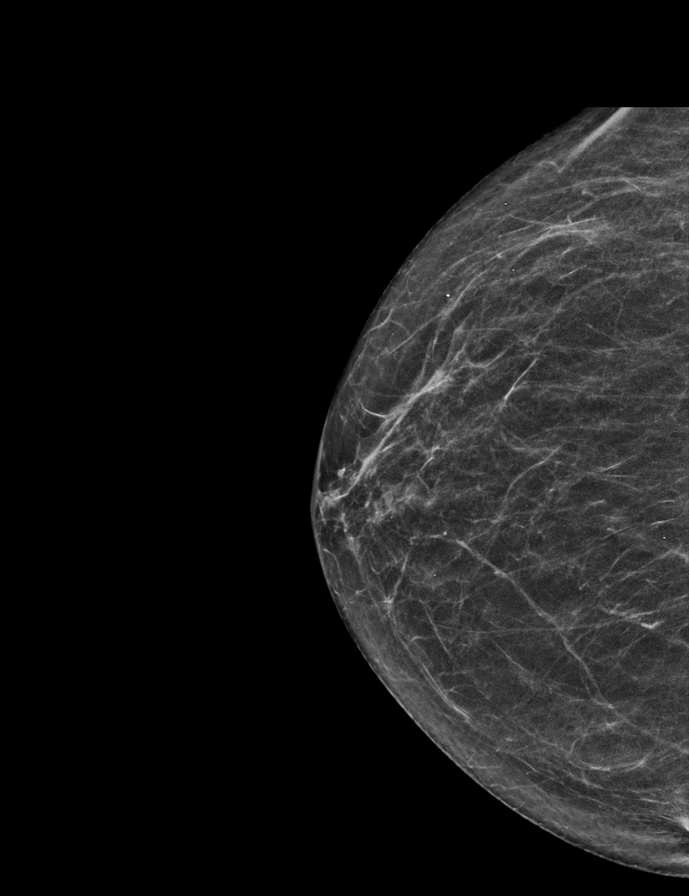

[L CC synth-2D]
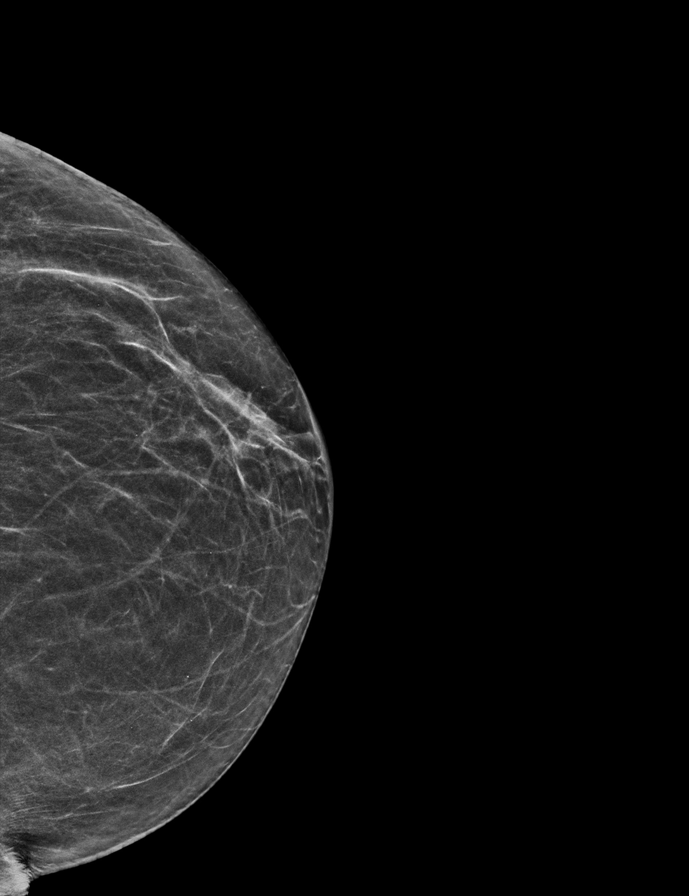

[L MLO synth-2D]
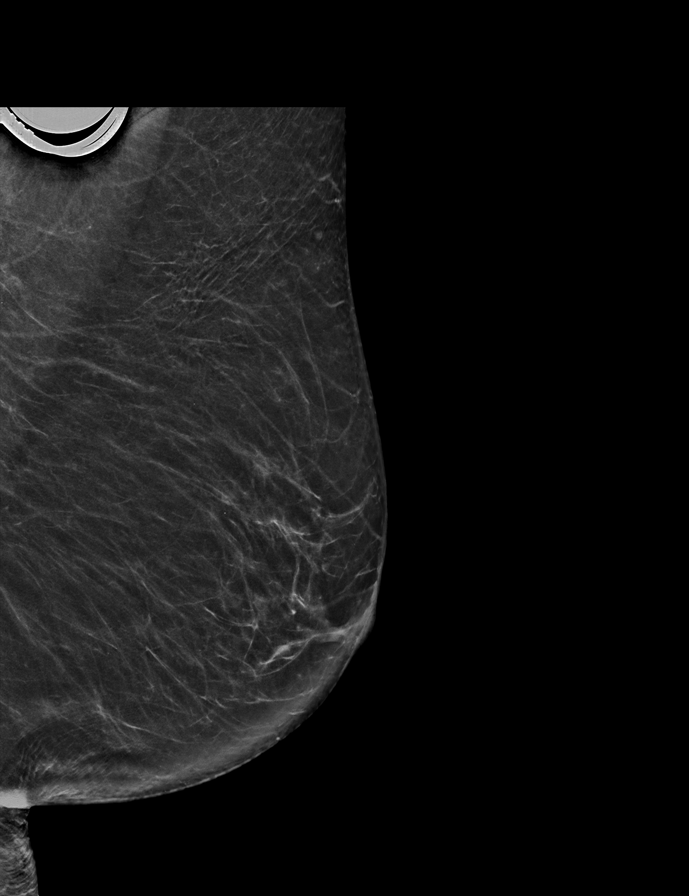

[R MLO tomo · 2 of 48 frames shown]
[frame 16/48]
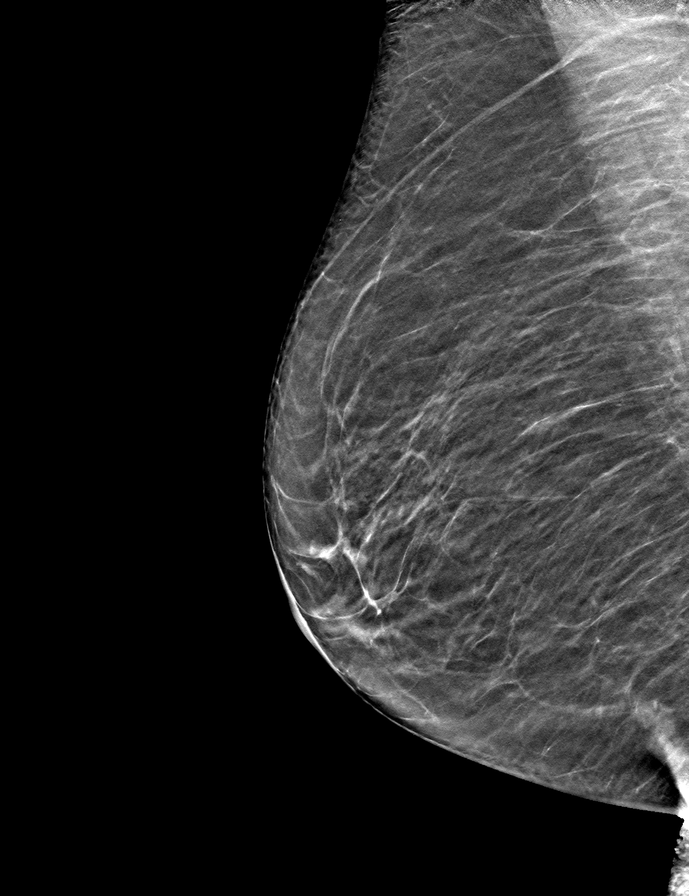
[frame 25/48]
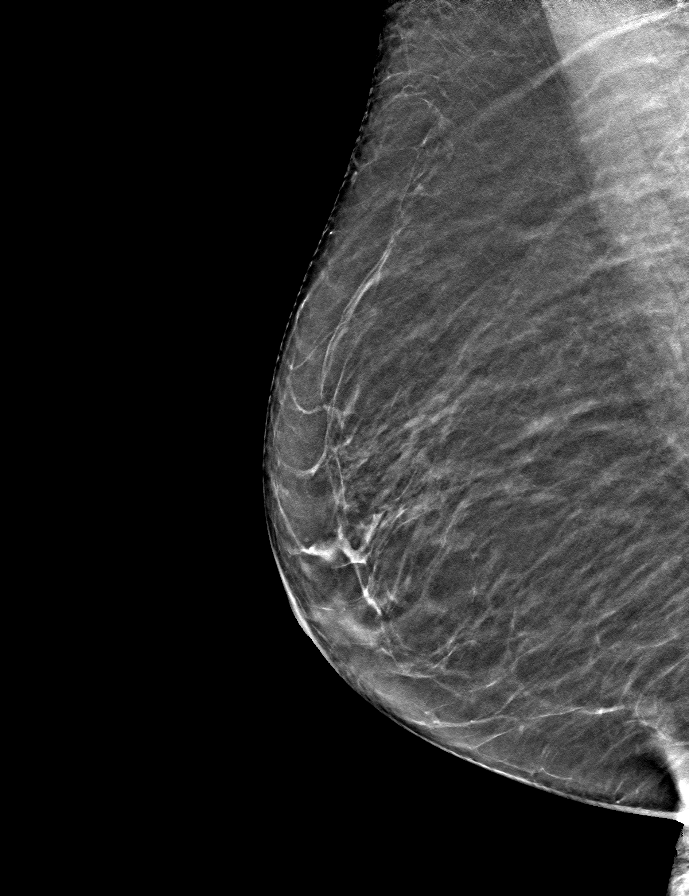

[L CC tomo · tomo slice 26/51.0]
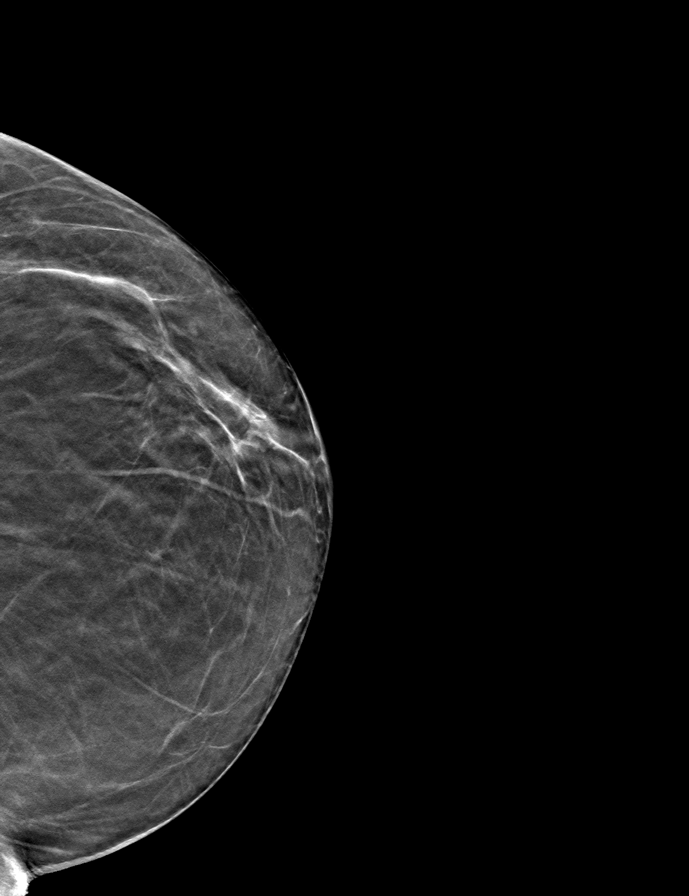

[L MLO tomo · tomo slice 31/61.0]
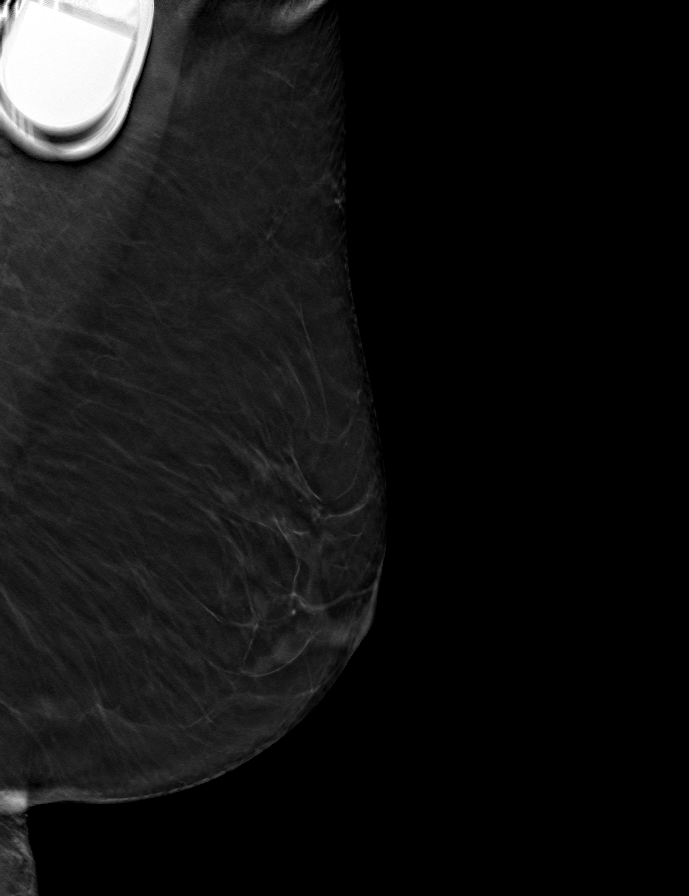

[R CC tomo · tomo slice 25/48.0]
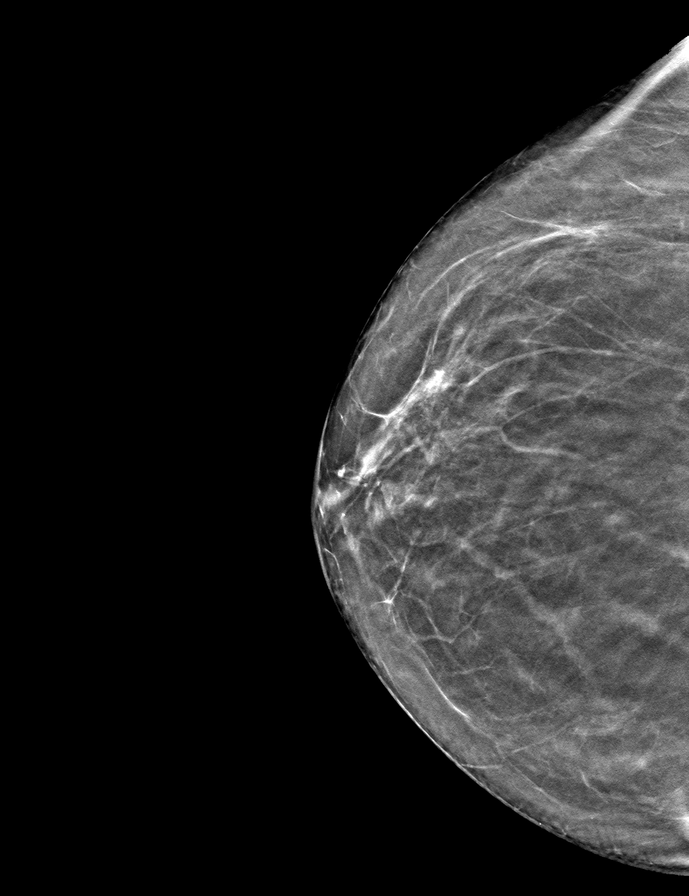

[9 of 24 positions shown; findings below may reference images not displayed]

ACR Breast Density Category b: There are scattered areas of
fibroglandular density.
FINDINGS: There are no findings suspicious for malignancy.
IMPRESSION: No mammographic evidence of malignancy. A result letter of this
screening mammogram will be mailed directly to the patient.

RECOMMENDATION:
Screening mammogram in one year. (Code:51-O-LD2)

BI-RADS CATEGORY  1: Negative.

## 2023-09-25 ENCOUNTER — Ambulatory Visit: Attending: Cardiology | Admitting: *Deleted

## 2023-09-25 DIAGNOSIS — I480 Paroxysmal atrial fibrillation: Secondary | ICD-10-CM | POA: Diagnosis not present

## 2023-09-25 DIAGNOSIS — Z5181 Encounter for therapeutic drug level monitoring: Secondary | ICD-10-CM | POA: Diagnosis not present

## 2023-09-25 LAB — POCT INR: POC INR: 2.1

## 2023-09-25 NOTE — Progress Notes (Signed)
 Lab Results  Component Value Date   INR 2.1 09/25/2023   INR 1.9 (A) 08/21/2023   INR 1.8 (A) 07/17/2023    Description   Continue taking warfarin 4mg  daily.  Recheck INR in 6 weeks.  Please call the Coumadin  Clinic at [phone] with any questions     INR 2.1; Please see anticoagulation encounter

## 2023-09-25 NOTE — Patient Instructions (Signed)
 Description   Continue taking warfarin 4mg  daily.  Recheck INR in 6 weeks.  Please call the Coumadin  Clinic at [phone] with any questions

## 2023-09-30 DIAGNOSIS — E039 Hypothyroidism, unspecified: Secondary | ICD-10-CM | POA: Diagnosis not present

## 2023-10-04 DIAGNOSIS — N1831 Chronic kidney disease, stage 3a: Secondary | ICD-10-CM | POA: Diagnosis not present

## 2023-10-04 DIAGNOSIS — I480 Paroxysmal atrial fibrillation: Secondary | ICD-10-CM | POA: Diagnosis not present

## 2023-10-04 DIAGNOSIS — I10 Essential (primary) hypertension: Secondary | ICD-10-CM | POA: Diagnosis not present

## 2023-10-05 NOTE — Progress Notes (Signed)
 Cardiology Office Note:    Date:  10/06/2023   ID:  Paula [name], DOB 1935-05-19, MRN [id number]  PCP:  Charlott Dorn LABOR, MD  Cardiologist:  [name], MD  Electrophysiologist:  OLE ONEIDA HOLTS, MD   Referring MD: Charlott Dorn LABOR, *   Chief Complaint  Patient presents with   Cardiomyopathy    History of Present Illness:    Paula [name] is a 88 y.o. female with a hx of paroxysmal atrial fibrillation, tachybrady syndrome status post PPM, hypertension, hypothyroidism who presents for follow-up.  She was referred by Paula Kicks, PA for evaluation of hypertrophic cardiomyopathy, initially seen on 06/14/2019.  Diagnosed with atrial fibrillation after presenting with palpitations to PCP office in April 2021. EKG showed A. fib with RVR. Started on Eliquis  given CHA2DS2-VASc score 5.  Also started on diltiazem  for rate control. She was seen in AF clinic on 05/11/2019, she was noted to be in normal sinus rhythm. She was continued on Eliquis  5 mg twice daily and diltiazem  180 mg daily. TTE on 05/27/19 was concerning for apical variant hypertrophic cardiomyopathy, EF 60 to 65%, normal RV function, moderate elevation in RVSP (48 mmHg), mild mitral regurgitation, mild to moderate tricuspid regurgitation.  Cardiac MRI on 07/05/2019 showed LV apical hypertrophy measuring up to 15 mm, consistent with apical hypertrophic cardiomyopathy, small apical aneurysm, patchy apical LGE accounting for 1% of total myocardial mass, hyperdynamic LV systolic function, normal RV function, mild MR (regurgitant fraction 24%).  Zio patch x7 days on 07/14/2019 showed AF burden 47%, with episode lasting 3 days and average rate 118 bpm, no NSVT, frequent episodes of SVT, longest lasting 19 seconds.  She was admitted in June 2022 with near syncope, underwent PPM for tachybrady syndrome.  Echo 02/11/2022 showed EF 60 to 65%, severe asymmetric LV apical hypertrophy, normal RV function, severe left  atrial enlargement, moderate to severe mitral regurgitation, moderate to severe tricuspid regurgitation.  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  Echocardiogram 03/2023 shows EF 60 to 65%, normal RV function, RVSP 61 mmHg, severe left atrial enlargement, moderate MR, severe TR, mild aortic stenosis, RAP 3.  Since last clinic visit, she reports she is doing well.  Reports BP has been controlled.  Denies any chest pain, lightheadedness, syncope, lower extremity edema, or palpitations.  Reports BP 110s to 140s at home.  Does report some dyspnea on exertion.  Reports lightheadedness has improved.  She is off amiodarone .  She has not needed to take Lasix .   Wt Readings from Last 3 Encounters:  10/06/23 130 lb 9.6 oz (59.2 kg)  04/09/23 130 lb 3.2 oz (59.1 kg)  03/19/23 129 lb (58.5 kg)   BP Readings from Last 3 Encounters:  10/06/23 118/64  04/09/23 122/68  03/19/23 (!) 172/87     Past Medical History:  Diagnosis Date   Anemia    Atrial fibrillation (HCC)    CHF (congestive heart failure) (HCC)    DVT (deep venous thrombosis) (HCC)    in her heart   Hyperlipidemia    Hypertension    Hypothyroidism    Irregular heart rate     Past Surgical History:  Procedure Laterality Date   APPENDECTOMY     in early 1970's  PACEMAKER IMPLANT N/A 07/17/2020   Procedure: PACEMAKER IMPLANT;  Surgeon: Waddell Danelle ORN, MD;  Location: [facility] INVASIVE CV LAB;  Service: Cardiovascular;  Laterality: N/A;   salvia gland  2012   Dr Carlie   VAGINAL HYSTERECTOMY     partial    Current Medications: Current Meds  Medication Sig   acetaminophen  (TYLENOL ) 325 MG tablet every 4 (four) hours as  needed for moderate pain or headache.   amiodarone  (PACERONE ) 100 MG tablet TAKE 1 TABLET EVERY DAY   Calcium  Carb-Cholecalciferol 600-5 MG-MCG TABS daily at 6 (six) AM. Patient takes this Medication in the afternoon not 6am   diltiazem  (CARDIZEM  CD) 300 MG 24 hr capsule TAKE 1 CAPSULE EVERY DAY   enoxaparin  (LOVENOX ) 60 MG/0.6ML injection Inject 0.6 mLs (60 mg total) into the skin every 12 (twelve) hours.   famotidine  (PEPCID  AC) 10 MG tablet as needed for heartburn or indigestion.   furosemide  (LASIX ) 20 MG tablet Take 1 tablet (20 mg total) by mouth daily as needed.   levothyroxine  (SYNTHROID , LEVOTHROID) 88 MCG tablet Take 88 mcg by mouth daily before breakfast.   linaclotide  (LINZESS ) 290 MCG CAPS capsule Take 1 capsule (290 mcg total) by mouth daily before breakfast.   Multiple Vitamin (MULTIVITAMIN WITH MINERALS) TABS tablet Take 1 tablet by mouth daily.   polyethylene glycol powder (GLYCOLAX /MIRALAX ) 17 GM/SCOOP powder Take 17 g by mouth 2 (two) times daily.   prazosin  (MINIPRESS ) 1 MG capsule TAKE 1 CAPSULE AT BEDTIME   sodium chloride  (OCEAN) 0.65 % SOLN nasal spray Place 1 spray into both nostrils as needed for congestion.   valsartan  (DIOVAN ) 160 MG tablet TAKE 1 AND 1/2 TABLETS EVERY DAY   warfarin (COUMADIN ) 4 MG tablet Take 1 tablet by mouth once daily or as directed by anticoagulation clinic     Allergies:   Eliquis  [apixaban ] and Wound dressing adhesive   Social History   Socioeconomic History   Marital status: Married    Spouse name: Paula [name]   Number of children: 1   Years of education: Not on file   Highest education level: Not on file  Occupational History   Not on file  Tobacco Use   Smoking status: Never   Smokeless tobacco: Never  Vaping Use   Vaping status: Never Used  Substance and Sexual Activity   Alcohol use: Never    Alcohol/week: 1.0 standard drink of alcohol    Types: 1 Glasses of wine per week   Drug use: Never   Sexual activity: Not Currently     Partners: Male    Birth control/protection: Post-menopausal    Comment: married  Other Topics Concern   Not on file  Social History Narrative   Not on file   Social Drivers of Health   Financial Resource Strain: Not on file  Food Insecurity: Not on file  Transportation Needs: Not on file  Physical Activity: Not on file  Stress: Not on file  Social Connections: Not on file     Family History: The patient's family history includes Breast cancer (age of onset: 52) in her sister; Cancer in her father; Other in her mother. There is no history of Colon cancer, Esophageal cancer, or Rectal cancer.  ROS:   Please see the history of present illness.     All other systems reviewed and are negative.  EKGs/Labs/Other Studies Reviewed:    The following studies were reviewed today:  Monitor 05/01/2020: No significant abnormalities   Patch Wear Time:  2 days  and 23 hours (2022-04-01T16:38:40-0400 to 2022-04-04T16:10:58-0400)   Patient had a min HR of 35 bpm, max HR of 119 bpm, and avg HR of 61 bpm. Predominant underlying rhythm was Sinus Rhythm. 2 Supraventricular Tachycardia runs occurred, the run with the fastest interval lasting 4 beats with a max rate of 119 bpm, the longest lasting 4 beats with an avg rate of 98 bpm. Isolated SVEs were rare (<1.0%), SVE Couplets were rare (<1.0%), and SVE Triplets were rare (<1.0%). Isolated VEs were rare (<1.0%), and no VE Couplets or VE Triplets were present.  No patient triggered events  Monitor 07/14/2019: Episode of atrial fibrillation lasting 3 days 6 hours, average rate 118 bpm. AF burden 47%. 141 episodes of SVT, longest lasting 18.5 seconds.   7 days of data recorded on Zio monitor. Patient had a min HR of 55 bpm, max HR of 207 bpm, and avg HR of 95 bpm. Predominant underlying rhythm was Sinus Rhythm. No VT, high degree block, or pauses noted.  141 episodes of SVT, longest lasting 18.5 seconds.  Episode of atrial fibrillation lasting 3 days 6  hours, average rate 118 bpm.  AF burden 47%.   Isolated atrial and ventricular ectopy was rare (<1%). There were 0 triggered events.  CMR 07/05/2019: 1. LV hypertrophy at apex measuring up to 15mm, consistent with apical hypertrophic cardiomyopathy   2.  Small apical aneurysm   3. Patchy LGE at apex, consistent with apical HCM. LGE accounts for 1% of total myocardial mass   4.  Normal LV size with hyperdynamic systolic function (EF 70%)   5.  Normal RV size and systolic function (EF 58%)   6. Mild mitral regurgitation (regurgitant volume 16cc, regurgitant fraction 24%)  Echo 05/27/2019: 1. Apical variant hypertrophic cardiomyopathy with no evidence of apical  thrombus. Left ventricular ejection fraction, by estimation, is 60 to 65%.  The left ventricle has normal function. The left ventricle demonstrates  regional wall motion  abnormalities (see scoring diagram/findings for description). There is  severe left ventricular hypertrophy of the apical segment. Left  ventricular diastolic parameters are consistent with Grade I diastolic  dysfunction (impaired relaxation).   2. Right ventricular systolic function is normal. The right ventricular  size is normal. There is moderately elevated pulmonary artery systolic  pressure. The estimated right ventricular systolic pressure is 47.9 mmHg.   3. Left atrial size was mildly dilated.   4. The mitral valve is normal in structure. Mild mitral valve  regurgitation. No evidence of mitral stenosis.   5. Tricuspid valve regurgitation is mild to moderate.   6. Possible fusion of right and non coronary cusp. The aortic valve is  normal in structure. Aortic valve regurgitation is not visualized. Mild to  moderate aortic valve sclerosis/calcification is present, without any  evidence of aortic stenosis.   7. The inferior vena cava is normal in size with greater than 50%  respiratory variability, suggesting right atrial pressure of 3 mmHg.  Exercise  Myoview  07/12/2016: Nuclear stress EF: 64%. Blood pressure demonstrated a normal response to exercise. Horizontal ST segment depression ST segment depression was noted during stress in the III, aVF, V4, V5 and V6 leads, and returning to baseline after 1-5 minutes of recovery. Findings are nonspecific given baseline T wave inversions. The study is normal. This is a low risk study. The left ventricular ejection fraction is normal (55-65%).   EKG:   10/06/2023: Atrial paced, rate 63, nonspecific T wave flattening 09/12/2022: Atrial paced rhythm, rate 60, poor R  wave progression, nonspecific T wave flattening 01/08/2022: Atrial paced, rate 60, poor R wave progression, nonspecific T wave flattening 07/04/2021: Atrial paced, rate 60, Q waves in V1/2, nonspecific T wave flattening 12/27/20: Atrial paced, rate 60, Q waves in V1/2, no ST abnormalities 07/10/2020: EKG is not ordered today. 04/14/2020: sinus rhythm, rate 74, LVH with repolarization abnormalities  Recent Labs: 12/04/2022: BUN 17; Creatinine, Ser 1.11; Hemoglobin 12.9; Platelets 323; Potassium 4.7; Sodium 135 03/10/2023: ALT 51  Recent Lipid Panel    Component Value Date/Time   CHOL 207 (H) 12/04/2022 1201   TRIG 72 12/04/2022 1201   HDL 70 12/04/2022 1201   CHOLHDL 3.0 12/04/2022 1201   LDLCALC 124 (H) 12/04/2022 1201    Physical Exam:    VS:  BP 118/64   Pulse 63   Ht 5' 2 (1.575 m)   Wt 130 lb 9.6 oz (59.2 kg)   SpO2 97%   BMI 23.89 kg/m     Wt Readings from Last 3 Encounters:  10/06/23 130 lb 9.6 oz (59.2 kg)  04/09/23 130 lb 3.2 oz (59.1 kg)  03/19/23 129 lb (58.5 kg)     GEN: in no acute distress HEENT: Normal NECK: No JVD; No carotid bruits CARDIAC: RRR, 2/6 systolic murmur RESPIRATORY:  Clear to auscultation without rales, wheezing or rhonchi  ABDOMEN: Soft, non-tender, non-distended MUSCULOSKELETAL:  No edema; No deformity  SKIN: Warm and dry NEUROLOGIC:  Alert and oriented x 3 PSYCHIATRIC:  Normal affect    ASSESSMENT:    1. Hypertrophic cardiomyopathy (HCC)   2. LV (left ventricular) mural thrombus   3. Mitral valve insufficiency, unspecified etiology   4. Tricuspid valve insufficiency, unspecified etiology   5. Paroxysmal atrial fibrillation (HCC)   6. SVT (supraventricular tachycardia) (HCC)   7. Hypertension, unspecified type   8. Hyperlipidemia, unspecified hyperlipidemia type       PLAN:    Hypertrophic cardiomyopathy: cardiac MRI on 07/05/2019 showed LV apical hypertrophy measuring up to 15 mm, consistent with apical hypertrophic cardiomyopathy, small apical aneurysm, patchy apical LGE accounting for 1% of total myocardial mass.  No NSVT on Zio patch x 7 days.  Echo 02/11/2022 showed EF 60 to 65%, severe asymmetric LV apical hypertrophy, normal RV function, severe left atrial enlargement, moderate to severe mitral regurgitation, moderate to severe tricuspid regurgitation.  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  This was developed despite being on Eliquis .  Recommended switching to warfarin -Follows in Coumadin  clinic for INR management  Mitral regurgitation/tricuspid regurgitation: Moderate to severe MR and moderate TR on CMR 03/2022.  Echo 03/2023 showed moderate MR, severe TR.  Will monitor -Takes Lasix  20 mg daily as needed.  Monitor daily weights and take as needed if gains more than 3 pounds in 1 day or 5 pounds in 1 week.  Appears euvolemic in clinic today, reports has not  needed Lasix  recently  Paroxysmal atrial fibrillation: Diagnosed 04/2019 after presenting to PCPs office with palpitations. CHA2DS2-VASc score 5.  Zio patch x7 days on 07/14/2019 showed AF burden 47%, with episode lasting 3 days and average rate  118 bpm.  She was admitted in June 2022 with near syncope, underwent PPM for tachybrady syndrome. -Continue warfarin, follows in Coumadin  clinic -Was on amiodarone  200 mg daily.  Transaminitis noted on labs, dose reduced to 100 mg daily, then eventually discontinued -Continue diltiazem  300 mg daily  SVT: Frequent episodes on ZIO monitoring, longest lasting 19 seconds.  Continue diltiazem   Hypertension: On valsartan  240 mg mg daily, prazosin  1 mg nightly, diltiazem  300 mg daily.  BP appears controlled  Hyperlipidemia: Has been on rosuvastatin  5 mg daily, but held due to transaminitis.  LDL 124 on 12/04/2022  Transaminitis: elevation in liver enzymes noted.  Amiodarone  dose decreased to 100 mg daily.  Rosuvastatin  discontinued.  Liver ultrasound unremarkable.  Referred to hepatology for evaluation.  Transaminitis has improved   RTC in 6 months   Medication Adjustments/Labs and Tests Ordered: Current medicines are reviewed at length with the patient today.  Concerns regarding medicines are outlined above.  Orders Placed This Encounter  Procedures   EKG 12-Lead   ECHOCARDIOGRAM COMPLETE    No orders of the defined types were placed in this encounter.     Patient Instructions  Medication Instructions:  Your physician recommends that you continue on your current medications as directed. Please refer to the Current Medication list given to you today.  *If you need a refill on your cardiac medications before your next appointment, please call your pharmacy*  Lab Work: None If you have labs (blood work) drawn today and your tests are completely normal, you will receive your results only by: MyChart Message (if you have MyChart) OR A paper copy  in the mail If you have any lab test that is abnormal or we need to change your treatment, we will call you to review the results.  Testing/Procedures: Echo to be done in 6 months  Your physician has requested that you have an echocardiogram. Echocardiography is a painless test that uses sound waves to create images of your heart. It provides your doctor with information about the size and shape of your heart and how well your heart's chambers and valves are working. This procedure takes approximately one hour. There are no restrictions for this procedure. Please do NOT wear cologne, perfume, aftershave, or lotions (deodorant is allowed). Please arrive 15 minutes prior to your appointment time.  Please note: We ask at that you not bring children with you during ultrasound (echo/ vascular) testing. Due to room size and safety concerns, children are not allowed in the ultrasound rooms during exams. Our front office staff cannot provide observation of children in our lobby area while testing is being conducted. An adult accompanying a patient to their appointment will only be allowed in the ultrasound room at the discretion of the ultrasound technician under special circumstances. We apologize for any inconvenience.   Follow-Up: At [facility], you and your health needs are our priority.  As part of our continuing mission to provide you with exceptional heart care, our providers are all part of one team.  This team includes your primary Cardiologist (physician) and Advanced Practice Providers or APPs (Physician Assistants and Nurse Practitioners) who all work together to provide you with the care you need, when you need it.  Your next appointment:   Post echo  Provider:   Dr. Kate  We recommend signing up for the patient portal called MyChart.  Sign up information is provided on this After Visit Summary.  MyChart is used to connect with patients for Virtual  Visits (Telemedicine).   Patients are able to view lab/test results, encounter notes, upcoming appointments, etc.  Non-urgent messages can be sent to your provider as well.   To learn more about what you can do with MyChart, go to [email or web address].   Other Instructions none            Signed, [name], MD  10/06/2023 2:20 PM    [facility]

## 2023-10-06 ENCOUNTER — Encounter: Payer: Self-pay | Admitting: Cardiology

## 2023-10-06 ENCOUNTER — Ambulatory Visit: Attending: Cardiology | Admitting: Cardiology

## 2023-10-06 VITALS — BP 118/64 | HR 63 | Ht 62.0 in | Wt 130.6 lb

## 2023-10-06 DIAGNOSIS — I422 Other hypertrophic cardiomyopathy: Secondary | ICD-10-CM

## 2023-10-06 DIAGNOSIS — I480 Paroxysmal atrial fibrillation: Secondary | ICD-10-CM | POA: Diagnosis not present

## 2023-10-06 DIAGNOSIS — I340 Nonrheumatic mitral (valve) insufficiency: Secondary | ICD-10-CM

## 2023-10-06 DIAGNOSIS — I10 Essential (primary) hypertension: Secondary | ICD-10-CM | POA: Diagnosis not present

## 2023-10-06 DIAGNOSIS — I071 Rheumatic tricuspid insufficiency: Secondary | ICD-10-CM

## 2023-10-06 DIAGNOSIS — I471 Supraventricular tachycardia, unspecified: Secondary | ICD-10-CM

## 2023-10-06 DIAGNOSIS — I513 Intracardiac thrombosis, not elsewhere classified: Secondary | ICD-10-CM

## 2023-10-06 DIAGNOSIS — E785 Hyperlipidemia, unspecified: Secondary | ICD-10-CM | POA: Diagnosis not present

## 2023-10-06 NOTE — Patient Instructions (Addendum)
 Medication Instructions:  Your physician recommends that you continue on your current medications as directed. Please refer to the Current Medication list given to you today.  *If you need a refill on your cardiac medications before your next appointment, please call your pharmacy*  Lab Work: None If you have labs (blood work) drawn today and your tests are completely normal, you will receive your results only by: MyChart Message (if you have MyChart) OR A paper copy in the mail If you have any lab test that is abnormal or we need to change your treatment, we will call you to review the results.  Testing/Procedures: Echo to be done in 6 months  Your physician has requested that you have an echocardiogram. Echocardiography is a painless test that uses sound waves to create images of your heart. It provides your doctor with information about the size and shape of your heart and how well your heart's chambers and valves are working. This procedure takes approximately one hour. There are no restrictions for this procedure. Please do NOT wear cologne, perfume, aftershave, or lotions (deodorant is allowed). Please arrive 15 minutes prior to your appointment time.  Please note: We ask at that you not bring children with you during ultrasound (echo/ vascular) testing. Due to room size and safety concerns, children are not allowed in the ultrasound rooms during exams. Our front office staff cannot provide observation of children in our lobby area while testing is being conducted. An adult accompanying a patient to their appointment will only be allowed in the ultrasound room at the discretion of the ultrasound technician under special circumstances. We apologize for any inconvenience.   Follow-Up: At [facility], you and your health needs are our priority.  As part of our continuing mission to provide you with exceptional heart care, our providers are all part of one team.  This team includes  your primary Cardiologist (physician) and Advanced Practice Providers or APPs (Physician Assistants and Nurse Practitioners) who all work together to provide you with the care you need, when you need it.  Your next appointment:   Post echo  Provider:   Dr. Kate  We recommend signing up for the patient portal called MyChart.  Sign up information is provided on this After Visit Summary.  MyChart is used to connect with patients for Virtual Visits (Telemedicine).  Patients are able to view lab/test results, encounter notes, upcoming appointments, etc.  Non-urgent messages can be sent to your provider as well.   To learn more about what you can do with MyChart, go to [email or web address].   Other Instructions none

## 2023-10-20 ENCOUNTER — Ambulatory Visit: Payer: Self-pay | Admitting: Internal Medicine

## 2023-10-20 ENCOUNTER — Ambulatory Visit: Payer: Medicare HMO

## 2023-10-20 DIAGNOSIS — M8580 Other specified disorders of bone density and structure, unspecified site: Secondary | ICD-10-CM | POA: Diagnosis not present

## 2023-10-20 DIAGNOSIS — I422 Other hypertrophic cardiomyopathy: Secondary | ICD-10-CM

## 2023-10-20 LAB — CUP PACEART REMOTE DEVICE CHECK
Battery Remaining Longevity: 80 mo
Battery Remaining Percentage: 74 %
Battery Voltage: 3.01 V
Brady Statistic AP VP Percent: 1 %
Brady Statistic AP VS Percent: 73 %
Brady Statistic AS VP Percent: 1 %
Brady Statistic AS VS Percent: 27 %
Brady Statistic RA Percent Paced: 71 %
Brady Statistic RV Percent Paced: 1 %
Date Time Interrogation Session: 20250929020014
Implantable Lead Connection Status: 753985
Implantable Lead Connection Status: 753985
Implantable Lead Implant Date: 20220627
Implantable Lead Implant Date: 20220627
Implantable Lead Location: 753859
Implantable Lead Location: 753860
Implantable Pulse Generator Implant Date: 20220627
Lead Channel Impedance Value: 380 Ohm
Lead Channel Impedance Value: 410 Ohm
Lead Channel Pacing Threshold Amplitude: 0.75 V
Lead Channel Pacing Threshold Amplitude: 0.75 V
Lead Channel Pacing Threshold Pulse Width: 0.5 ms
Lead Channel Pacing Threshold Pulse Width: 0.5 ms
Lead Channel Sensing Intrinsic Amplitude: 2.9 mV
Lead Channel Sensing Intrinsic Amplitude: 7.5 mV
Lead Channel Setting Pacing Amplitude: 2 V
Lead Channel Setting Pacing Amplitude: 2.5 V
Lead Channel Setting Pacing Pulse Width: 0.5 ms
Lead Channel Setting Sensing Sensitivity: 2 mV
Pulse Gen Model: 2272
Pulse Gen Serial Number: 3933448

## 2023-10-21 ENCOUNTER — Other Ambulatory Visit: Payer: Self-pay | Admitting: Cardiology

## 2023-10-21 DIAGNOSIS — E039 Hypothyroidism, unspecified: Secondary | ICD-10-CM | POA: Diagnosis not present

## 2023-10-21 DIAGNOSIS — I480 Paroxysmal atrial fibrillation: Secondary | ICD-10-CM | POA: Diagnosis not present

## 2023-10-21 DIAGNOSIS — N1831 Chronic kidney disease, stage 3a: Secondary | ICD-10-CM | POA: Diagnosis not present

## 2023-10-21 DIAGNOSIS — I10 Essential (primary) hypertension: Secondary | ICD-10-CM | POA: Diagnosis not present

## 2023-10-21 DIAGNOSIS — E785 Hyperlipidemia, unspecified: Secondary | ICD-10-CM | POA: Diagnosis not present

## 2023-10-22 ENCOUNTER — Telehealth: Payer: Self-pay

## 2023-10-22 NOTE — Telephone Encounter (Signed)
 Alert received from [employer] for AF ongoing from 9/21, not always good rate control, on OAC per EPIC.  Attempted to contact patient. No answer, left message to call back.

## 2023-10-22 NOTE — Progress Notes (Signed)
 Remote PPM Transmission

## 2023-10-23 NOTE — Telephone Encounter (Signed)
 [facility] to have send updated transmission and re-assess AF burden.

## 2023-10-23 NOTE — Telephone Encounter (Signed)
 Spoke with patient - she can tell she is back in AF because she is SOB with exertion.  Is off of Amio now due to concerns for liver toxicity.   Reviewed remote transmission:  Patient continues in AF and with noted poor ventricular rate control.   Appt made with Dr. Waddell tomorrow 10/24/23 at 145pm.  Patient aware and agreeable to come in.

## 2023-10-24 ENCOUNTER — Encounter: Payer: Self-pay | Admitting: Internal Medicine

## 2023-10-24 ENCOUNTER — Ambulatory Visit: Attending: Internal Medicine | Admitting: Internal Medicine

## 2023-10-24 VITALS — BP 112/60 | HR 107 | Ht 62.0 in | Wt 134.6 lb

## 2023-10-24 DIAGNOSIS — I4819 Other persistent atrial fibrillation: Secondary | ICD-10-CM | POA: Diagnosis not present

## 2023-10-24 MED ORDER — DRONEDARONE HCL 400 MG PO TABS: 400.0000 mg | ORAL_TABLET | Freq: Two times a day (BID) | ORAL | 3 refills | Status: DC

## 2023-10-24 NOTE — Progress Notes (Signed)
 HPI Paula [name] is a 88 y.o. female with apical hypertrophic cardiomyopathy, paroxysmal atrial fibrillation, hypertension, hypothyroidism who was found to have near syncope due to long sinus pauses and underwent DDD PM insertion. She feels much better. No syncope, chest pain or sob. She still gets tired easily and admits to being sedentary. Her LFT's increased on amiodarone  and it was stopped. She has developed atrial fib with a RVR and present for eval. She has had worsening peripheral edema. She has been taking lasix  daily. Allergies  Allergen Reactions   Eliquis  [Apixaban ] Other (See Comments)    Doesn't Work for her   Wound Dressing Adhesive Rash     Current Outpatient Medications  Medication Sig Dispense Refill   acetaminophen  (TYLENOL ) 325 MG tablet every 4 (four) hours as needed for moderate pain or headache.     Calcium  Carb-Cholecalciferol 600-5 MG-MCG TABS daily at 6 (six) AM. Patient takes this Medication in the afternoon not 6am     diltiazem  (CARDIZEM  CD) 300 MG 24 hr capsule TAKE 1 CAPSULE EVERY DAY 90 capsule 3   famotidine  (PEPCID  AC) 10 MG tablet as needed for heartburn or indigestion.     furosemide  (LASIX ) 20 MG tablet Take 1 tablet (20 mg total) by mouth daily as needed. 90 tablet 3   levothyroxine  (SYNTHROID ) 112 MCG tablet Take 112 mcg by mouth every morning.     linaclotide  (LINZESS ) 290 MCG CAPS capsule Take 1 capsule (290 mcg total) by mouth daily before breakfast. 30 capsule 0   Multiple Vitamin (MULTIVITAMIN WITH MINERALS) TABS tablet Take 1 tablet by mouth daily.     polyethylene glycol powder (GLYCOLAX /MIRALAX ) 17 GM/SCOOP powder Take 17 g by mouth 2 (two) times daily. (Patient taking differently: Take 17 g by mouth daily.) 238 g 0   prazosin  (MINIPRESS ) 1 MG capsule TAKE 1 CAPSULE AT BEDTIME 90 capsule 3   sodium chloride  (OCEAN) 0.65 % SOLN nasal spray Place 1 spray into both nostrils as needed for congestion.     valsartan  (DIOVAN ) 160 MG  tablet TAKE 1 AND 1/2 TABLETS EVERY DAY 135 tablet 2   warfarin (COUMADIN ) 4 MG tablet Take 1 tablet by mouth once daily or as directed by anticoagulation clinic 100 tablet 0   No current facility-administered medications for this visit.     Past Medical History:  Diagnosis Date   Anemia    Atrial fibrillation (HCC)    CHF (congestive heart failure) (HCC)    DVT (deep venous thrombosis) (HCC)    in her heart   Hyperlipidemia    Hypertension    Hypothyroidism    Irregular heart rate     ROS:   All systems reviewed and negative except as noted in the HPI.   Past Surgical History:  Procedure Laterality Date   APPENDECTOMY     in early 1970's   PACEMAKER IMPLANT N/A 07/17/2020   Procedure: PACEMAKER IMPLANT;  Surgeon: Waddell Danelle ORN, MD;  Location: [facility] INVASIVE CV LAB;  Service: Cardiovascular;  Laterality: N/A;   salvia gland  2012   Dr Carlie   VAGINAL HYSTERECTOMY     partial     Family History  Problem Relation Age of Onset   Other Mother        heart attack or aneursym   Cancer Father        blood cancer   Breast cancer Sister 52   Colon cancer Neg Hx    Esophageal cancer Neg Hx  Rectal cancer Neg Hx      Social History   Socioeconomic History   Marital status: Married    Spouse name: Beryl   Number of children: 1   Years of education: Not on file   Highest education level: Not on file  Occupational History   Not on file  Tobacco Use   Smoking status: Never   Smokeless tobacco: Never  Vaping Use   Vaping status: Never Used  Substance and Sexual Activity   Alcohol use: Never    Alcohol/week: 1.0 standard drink of alcohol    Types: 1 Glasses of wine per week   Drug use: Never   Sexual activity: Not Currently    Partners: Male    Birth control/protection: Post-menopausal    Comment: married  Other Topics Concern   Not on file  Social History Narrative   Not on file   Social Drivers of Health   Financial Resource Strain: Not on file   Food Insecurity: Not on file  Transportation Needs: Not on file  Physical Activity: Not on file  Stress: Not on file  Social Connections: Not on file  Intimate Partner Violence: Not on file     BP 112/60   Pulse (!) 107   Ht 5' 2 (1.575 m)   Wt 134 lb 9.6 oz (61.1 kg)   SpO2 98%   BMI 24.62 kg/m   Physical Exam:  Well appearing NAD HEENT: Unremarkable Neck:  No JVD, no thyromegally Lymphatics:  No adenopathy Back:  No CVA tenderness Lungs:  Clear HEART:  Regular rate rhythm, no murmurs, no rubs, no clicks Abd:  soft, positive bowel sounds, no organomegally, no rebound, no guarding Ext:  2 plus pulses, no edema, no cyanosis, no clubbing Skin:  No rashes no nodules Neuro:  CN II through XII intact, motor grossly intact  DEVICE  Normal device function.  See PaceArt for details.   Assess/Plan:  Near syncope - this has resolved since her DDD PM was inserted.    2.  Atrial fibrillation - we have stopped amiodarone  and will start multaq. If no improvement would consider uptitration of a beta blocker and rate control. Might proceed with DCCV if she does not convert on the multaq. 3.  Apical variant hypertrophic cardiomyopathy - she is stable with essentially no symptoms.    4. Coags - she has had a stroke and her eliquis  was stopped and she was started on warfarin. 5. Peripheral edema - she will take lasix  daily.   Danelle Nakia Koble,MD

## 2023-10-24 NOTE — Patient Instructions (Addendum)
 Medication Instructions:  Your physician has recommended you make the following change in your medication:  Start Multaq 400 mg twice daily with food.  Lab Work: None ordered.  You may go to any [facility] Location for your lab work:  [facility] - [address] ([facility]) - [address] [phone] [address]  [place] - [address]   [place]  - [address]   [place] - [address] - [address] [employer]  - [address] [place] - [address] ([facility]   If you have labs (blood work) drawn today and your tests are completely normal, you will receive your results only by: [employer] (if you have MyChart)  If you have any lab test that is abnormal or we need to change your treatment, we will call you or send a MyChart message to review the results.  Testing/Procedures: None ordered.  Follow-Up: At [facility], you and your health needs are our priority.  As part of our continuing mission to provide you with exceptional heart care, we have created designated Provider Care Teams.  These Care Teams include your primary Cardiologist (physician) and Advanced Practice Providers (APPs -  Physician Assistants and Nurse Practitioners) who all work together to provide you with the care you need, when you need it.  We recommend signing up for the patient portal called MyChart.  Sign up information is provided on this After Visit Summary.  MyChart is used to connect with patients for Virtual Visits (Telemedicine).  Patients are able to view lab/test results, encounter notes, upcoming appointments, etc.  Non-urgent messages can be sent to your provider as well.   To learn more about what you can do with MyChart, go to [email or web address].    Your next appointment:   EKG in 3 weeks 5/6 weeks follow up with Dr Waddell  The format for your next appointment:    In Person  Provider:   Danelle Waddell, MD

## 2023-11-03 ENCOUNTER — Other Ambulatory Visit: Payer: Self-pay | Admitting: Cardiology

## 2023-11-03 DIAGNOSIS — I10 Essential (primary) hypertension: Secondary | ICD-10-CM | POA: Diagnosis not present

## 2023-11-03 DIAGNOSIS — N1831 Chronic kidney disease, stage 3a: Secondary | ICD-10-CM | POA: Diagnosis not present

## 2023-11-03 DIAGNOSIS — I480 Paroxysmal atrial fibrillation: Secondary | ICD-10-CM | POA: Diagnosis not present

## 2023-11-06 ENCOUNTER — Ambulatory Visit: Attending: Cardiology

## 2023-11-06 DIAGNOSIS — I480 Paroxysmal atrial fibrillation: Secondary | ICD-10-CM

## 2023-11-06 DIAGNOSIS — I513 Intracardiac thrombosis, not elsewhere classified: Secondary | ICD-10-CM | POA: Diagnosis not present

## 2023-11-06 DIAGNOSIS — Z5181 Encounter for therapeutic drug level monitoring: Secondary | ICD-10-CM

## 2023-11-06 LAB — POCT INR: INR: 3.5 — AB (ref 2.0–3.0)

## 2023-11-06 NOTE — Patient Instructions (Signed)
 Hold tonight only then Continue taking warfarin 4mg  daily.  Recheck INR in 4 weeks.  Please call the Coumadin  Clinic at [phone] with any questions

## 2023-11-06 NOTE — Progress Notes (Signed)
 INR 3.5 Please see anticoagulation encounter Hold tonight only then Continue taking warfarin 4mg  daily.  Recheck INR in 4 weeks.  Please call the Coumadin  Clinic at [phone] with any questions

## 2023-11-12 ENCOUNTER — Other Ambulatory Visit: Payer: Self-pay | Admitting: Cardiology

## 2023-11-12 DIAGNOSIS — Z23 Encounter for immunization: Secondary | ICD-10-CM | POA: Diagnosis not present

## 2023-11-12 DIAGNOSIS — E039 Hypothyroidism, unspecified: Secondary | ICD-10-CM | POA: Diagnosis not present

## 2023-11-14 ENCOUNTER — Ambulatory Visit: Attending: Cardiovascular Disease | Admitting: *Deleted

## 2023-11-14 VITALS — BP 124/76 | HR 84 | Ht 62.5 in

## 2023-11-14 DIAGNOSIS — I4819 Other persistent atrial fibrillation: Secondary | ICD-10-CM

## 2023-11-14 NOTE — Progress Notes (Signed)
   Nurse Visit   Date of Encounter: 11/14/2023 ID: Kristal Perl, DOB 06/14/1935, MRN [id number]  PCP:  Charlott Dorn LABOR, MD   [facility] Providers Cardiologist:  [name], MD Electrophysiologist:  OLE ONEIDA HOLTS, MD      Visit Details   VS:  BP 124/76 (BP Location: Left Arm, Patient Position: Sitting, Cuff Size: Normal)   Pulse 84   Ht 5' 2.5 (1.588 m)   BMI 24.23 kg/m  , BMI Body mass index is 24.23 kg/m.  Wt Readings from Last 3 Encounters:  10/24/23 134 lb 9.6 oz (61.1 kg)  10/06/23 130 lb 9.6 oz (59.2 kg)  04/09/23 130 lb 3.2 oz (59.1 kg)     Reason for visit: EKG after starting Multaq Performed today: Vitals, EKG, Provider consulted:Taylor, and Education Changes (medications, testing, etc.) : none Length of Visit: 10 minutes    Medications Adjustments/Labs and Tests Ordered: Orders Placed This Encounter  Procedures   EKG 12-Lead   No orders of the defined types were placed in this encounter.    Signed, Sharlet Servant, RN  11/14/2023 10:55 AM

## 2023-11-14 NOTE — Patient Instructions (Signed)
 Medication Instructions:  Please continue your current medications as listed.  *If you need a refill on your cardiac medications before your next appointment, please call your pharmacy*  Follow-Up: At [facility], you and your health needs are our priority.  As part of our continuing mission to provide you with exceptional heart care, our providers are all part of one team.  This team includes your primary Cardiologist (physician) and Advanced Practice Providers or APPs (Physician Assistants and Nurse Practitioners) who all work together to provide you with the care you need, when you need it.  Your next appointment:   As instructed    We recommend signing up for the patient portal called MyChart.  Sign up information is provided on this After Visit Summary.  MyChart is used to connect with patients for Virtual Visits (Telemedicine).  Patients are able to view lab/test results, encounter notes, upcoming appointments, etc.  Non-urgent messages can be sent to your provider as well.   To learn more about what you can do with MyChart, go to [email or web address].

## 2023-11-21 DIAGNOSIS — N1831 Chronic kidney disease, stage 3a: Secondary | ICD-10-CM | POA: Diagnosis not present

## 2023-11-21 DIAGNOSIS — E785 Hyperlipidemia, unspecified: Secondary | ICD-10-CM | POA: Diagnosis not present

## 2023-11-21 DIAGNOSIS — E039 Hypothyroidism, unspecified: Secondary | ICD-10-CM | POA: Diagnosis not present

## 2023-11-21 DIAGNOSIS — I480 Paroxysmal atrial fibrillation: Secondary | ICD-10-CM | POA: Diagnosis not present

## 2023-11-21 DIAGNOSIS — I10 Essential (primary) hypertension: Secondary | ICD-10-CM | POA: Diagnosis not present

## 2023-12-01 ENCOUNTER — Encounter: Payer: Self-pay | Admitting: Internal Medicine

## 2023-12-01 ENCOUNTER — Ambulatory Visit: Attending: Internal Medicine | Admitting: Internal Medicine

## 2023-12-01 ENCOUNTER — Ambulatory Visit: Attending: Cardiology | Admitting: Pharmacist

## 2023-12-01 VITALS — BP 134/82 | Ht 62.5 in | Wt 134.3 lb

## 2023-12-01 DIAGNOSIS — I480 Paroxysmal atrial fibrillation: Secondary | ICD-10-CM

## 2023-12-01 DIAGNOSIS — Z5181 Encounter for therapeutic drug level monitoring: Secondary | ICD-10-CM | POA: Diagnosis not present

## 2023-12-01 DIAGNOSIS — I4819 Other persistent atrial fibrillation: Secondary | ICD-10-CM

## 2023-12-01 DIAGNOSIS — I513 Intracardiac thrombosis, not elsewhere classified: Secondary | ICD-10-CM

## 2023-12-01 LAB — CUP PACEART INCLINIC DEVICE CHECK
Battery Remaining Longevity: 82 mo
Battery Voltage: 3.01 V
Brady Statistic RA Percent Paced: 61 %
Brady Statistic RV Percent Paced: 3.8 %
Date Time Interrogation Session: 20251110122409
Implantable Lead Connection Status: 753985
Implantable Lead Connection Status: 753985
Implantable Lead Implant Date: 20220627
Implantable Lead Implant Date: 20220627
Implantable Lead Location: 753859
Implantable Lead Location: 753860
Implantable Pulse Generator Implant Date: 20220627
Lead Channel Impedance Value: 375 Ohm
Lead Channel Impedance Value: 400 Ohm
Lead Channel Pacing Threshold Amplitude: 1.75 V
Lead Channel Pacing Threshold Pulse Width: 0.5 ms
Lead Channel Sensing Intrinsic Amplitude: 1 mV
Lead Channel Sensing Intrinsic Amplitude: 6.1 mV
Lead Channel Setting Pacing Amplitude: 2 V
Lead Channel Setting Pacing Amplitude: 2.5 V
Lead Channel Setting Pacing Pulse Width: 0.5 ms
Lead Channel Setting Sensing Sensitivity: 2 mV
Pulse Gen Model: 2272
Pulse Gen Serial Number: 3933448

## 2023-12-01 LAB — POCT INR: INR: 2.2 (ref 2.0–3.0)

## 2023-12-01 MED ORDER — METOPROLOL SUCCINATE ER 25 MG PO TB24: 25.0000 mg | ORAL_TABLET | Freq: Every day | ORAL | 3 refills | Status: DC

## 2023-12-01 MED ORDER — VALSARTAN 160 MG PO TABS: 160.0000 mg | ORAL_TABLET | Freq: Every day | ORAL | 3 refills | Status: DC

## 2023-12-01 NOTE — Patient Instructions (Signed)
 Medication Instructions:  Your physician has recommended you make the following change in your medication:  Decrease valsartan  to 1 tablet daily. Start toprol XL 25mg  daily at night after dinner.  Lab Work: None ordered.  You may go to any [facility] Location for your lab work:  [facility] - [address] ([facility]) - [address] [phone] [address]   - [address]   [place]  - [address]   [place] - [address] - [address] [employer]  - [address] - [address] ([facility]   If you have labs (blood work) drawn today and your tests are completely normal, you will receive your results only by: [employer] (if you have MyChart)  If you have any lab test that is abnormal or we need to change your treatment, we will call you or send a MyChart message to review the results.  Testing/Procedures: None ordered.  Follow-Up: At [facility], you and your health needs are our priority.  As part of our continuing mission to provide you with exceptional heart care, we have created designated Provider Care Teams.  These Care Teams include your primary Cardiologist (physician) and Advanced Practice Providers (APPs -  Physician Assistants and Nurse Practitioners) who all work together to provide you with the care you need, when you need it.  Your next appointment:   January 2026  The format for your next appointment:   In Person  Provider:   Donnice Primus, MD or one of the following Advanced Practice Providers on your designated Care Team:   Charlies Arthur, [place] Ozell Jodie Passey, [place] Leotis Barrack, NP  Note: Remote monitoring is used to monitor your Pacemaker/ ICD from home. This monitoring reduces the number of office visits required to check your device to one time per year. It allows us  to keep an eye on the functioning of  your device to ensure it is working properly.

## 2023-12-01 NOTE — Progress Notes (Signed)
 Description   INR 2.2  Continue taking warfarin 4mg  daily.  Recheck INR in 4 weeks.  Please call the Coumadin  Clinic at [phone] with any questions

## 2023-12-01 NOTE — Progress Notes (Signed)
 HPI Paula [name] is a 88 y.o. female with apical hypertrophic cardiomyopathy, paroxysmal atrial fibrillation, hypertension, hypothyroidism who was found to have near syncope due to long sinus pauses and underwent DDD PM insertion. No syncope, chest pain or sob. She still gets tired easily and admits to being sedentary. Her LFT's increased on amiodarone  and it was stopped. She has developed atrial fib with a RVR and present for eval. She has had worsening peripheral edema. She has been taking lasix  daily. When I saw her last several weeks ago she was placed on multaq. She thinks that she is a little better.  Allergies  Allergen Reactions   Eliquis  [Apixaban ] Other (See Comments)    Doesn't Work for her   Wound Dressing Adhesive Rash     Current Outpatient Medications  Medication Sig Dispense Refill   acetaminophen  (TYLENOL ) 325 MG tablet every 4 (four) hours as needed for moderate pain or headache.     Calcium  Carb-Cholecalciferol 600-5 MG-MCG TABS daily at 6 (six) AM. Patient takes this Medication in the afternoon not 6am     diltiazem  (CARDIZEM  CD) 300 MG 24 hr capsule TAKE 1 CAPSULE EVERY DAY 90 capsule 3   dronedarone (MULTAQ) 400 MG tablet Take 1 tablet (400 mg total) by mouth 2 (two) times daily with a meal. 180 tablet 3   famotidine  (PEPCID  AC) 10 MG tablet as needed for heartburn or indigestion.     furosemide  (LASIX ) 20 MG tablet Take 1 tablet (20 mg total) by mouth daily as needed. 90 tablet 3   levothyroxine  (SYNTHROID ) 125 MCG tablet Take 125 mcg by mouth daily before breakfast.     linaclotide  (LINZESS ) 290 MCG CAPS capsule Take 1 capsule (290 mcg total) by mouth daily before breakfast. 30 capsule 0   metoprolol succinate (TOPROL XL) 25 MG 24 hr tablet Take 1 tablet (25 mg total) by mouth daily. At night after dinner 90 tablet 3   Multiple Vitamin (MULTIVITAMIN WITH MINERALS) TABS tablet Take 1 tablet by mouth daily.     polyethylene glycol powder  (GLYCOLAX /MIRALAX ) 17 GM/SCOOP powder Take 17 g by mouth 2 (two) times daily. 238 g 0   prazosin  (MINIPRESS ) 1 MG capsule TAKE 1 CAPSULE AT BEDTIME 90 capsule 3   sodium chloride  (OCEAN) 0.65 % SOLN nasal spray Place 1 spray into both nostrils as needed for congestion.     warfarin (COUMADIN ) 4 MG tablet TAKE 1 TABLET BY MOUTH ONCE DAILY OR AS DIRECTED BY ANTICOAGULATION CLINIC 90 tablet 3   valsartan  (DIOVAN ) 160 MG tablet Take 1 tablet (160 mg total) by mouth daily. 90 tablet 3   No current facility-administered medications for this visit.     Past Medical History:  Diagnosis Date   Anemia    Atrial fibrillation (HCC)    CHF (congestive heart failure) (HCC)    DVT (deep venous thrombosis) (HCC)    in her heart   Hyperlipidemia    Hypertension    Hypothyroidism    Irregular heart rate     ROS:   All systems reviewed and negative except as noted in the HPI.   Past Surgical History:  Procedure Laterality Date   APPENDECTOMY     in early 1970's   PACEMAKER IMPLANT N/A 07/17/2020   Procedure: PACEMAKER IMPLANT;  Surgeon: Waddell Danelle ORN, MD;  Location: [facility] INVASIVE CV LAB;  Service: Cardiovascular;  Laterality: N/A;   salvia gland  2012   Dr Carlie   VAGINAL HYSTERECTOMY  partial     Family History  Problem Relation Age of Onset   Other Mother        heart attack or aneursym   Cancer Father        blood cancer   Breast cancer Sister 65   Colon cancer Neg Hx    Esophageal cancer Neg Hx    Rectal cancer Neg Hx      Social History   Socioeconomic History   Marital status: Married    Spouse name: Beryl   Number of children: 1   Years of education: Not on file   Highest education level: Not on file  Occupational History   Not on file  Tobacco Use   Smoking status: Never   Smokeless tobacco: Never  Vaping Use   Vaping status: Never Used  Substance and Sexual Activity   Alcohol use: Never    Alcohol/week: 1.0 standard drink of alcohol    Types: 1 Glasses  of wine per week   Drug use: Never   Sexual activity: Not Currently    Partners: Male    Birth control/protection: Post-menopausal    Comment: married  Other Topics Concern   Not on file  Social History Narrative   Not on file   Social Drivers of Health   Financial Resource Strain: Not on file  Food Insecurity: Not on file  Transportation Needs: Not on file  Physical Activity: Not on file  Stress: Not on file  Social Connections: Not on file  Intimate Partner Violence: Not on file     BP 134/82   Ht 5' 2.5 (1.588 m)   Wt 134 lb 4.8 oz (60.9 kg)   SpO2 97%   BMI 24.17 kg/m   Physical Exam:  Well appearing NAD HEENT: Unremarkable Neck:  No JVD, no thyromegally Lymphatics:  No adenopathy Back:  No CVA tenderness Lungs:  Clear with no wheezes HEART:  IRegular rate rhythm, no murmurs, no rubs, no clicks Abd:  soft, positive bowel sounds, no organomegally, no rebound, no guarding Ext:  2 plus pulses, no edema, no cyanosis, no clubbing Skin:  No rashes no nodules Neuro:  CN II through XII intact, motor grossly intact   DEVICE  Normal device function.  See PaceArt for details.   Assess/Plan: Persistent atrial fib - today she is in atrial flutter but I was unable to pace her back to NSR. There is no good AA drug option for her and I don't that she would be able to maintain NSR though we have not totally ruled out a trial of dofetilide. I have not had good luck with dofetilide in patient's with HCM. PPM - her [employer] DDD PM is working normally. We will follow.   Danelle Ilo Beamon,MD

## 2023-12-01 NOTE — Patient Instructions (Signed)
 Description   INR 2.2  Continue taking warfarin 4mg  daily.  Recheck INR in 4 weeks.  Please call the Coumadin  Clinic at [phone] with any questions

## 2023-12-03 DIAGNOSIS — I480 Paroxysmal atrial fibrillation: Secondary | ICD-10-CM | POA: Diagnosis not present

## 2023-12-03 DIAGNOSIS — N1831 Chronic kidney disease, stage 3a: Secondary | ICD-10-CM | POA: Diagnosis not present

## 2023-12-03 DIAGNOSIS — I10 Essential (primary) hypertension: Secondary | ICD-10-CM | POA: Diagnosis not present

## 2023-12-10 ENCOUNTER — Telehealth: Payer: Self-pay | Admitting: Cardiology

## 2023-12-10 NOTE — Telephone Encounter (Signed)
 Pt c/o swelling/edema: STAT if pt has developed SOB within 24 hours  If swelling, where is the swelling located? No but states she thinks fluid is in her lungs   How much weight have you gained and in what time span? 5 lbs in a week   Have you gained 2 pounds in a day or 5 pounds in a week? 5 lbs in a week   Do you have a log of your daily weights (if so, list)? 11/19: 139.6  11/10: 134   Are you currently taking a fluid pill? No   Are you currently SOB? Nothing currently but states she has experienced some sob since starting a new med when she saw Dr. Waddell.   Have you traveled recently in a car or plane for an extended period of time? No

## 2023-12-10 NOTE — Telephone Encounter (Signed)
 Spoke with patient. C/o 5 lb weight gain in last week (11/10-11/19), including swelling in her face, eyes and states she may have some fluid in her lungs. Denies SOB at this time and did not sound SOB over the phone. Started Metoprolol  11/10 and states this is the day this started and only changes she has made in lifestyle. Advised patient to go to the Emergency Room several times and pt stated she did not want to wait in the ER for 8+ hours today. Pt requested message be sent to Dr. Kate and explained that message would be forwarded. Advised patient if swelling got worse and symptoms changed to call 911 and go to the Emergency Room.  Thank you,  [place], RN [facility] Triage

## 2023-12-11 ENCOUNTER — Other Ambulatory Visit: Payer: Self-pay

## 2023-12-11 ENCOUNTER — Encounter (HOSPITAL_BASED_OUTPATIENT_CLINIC_OR_DEPARTMENT_OTHER): Payer: Self-pay | Admitting: Emergency Medicine

## 2023-12-11 ENCOUNTER — Emergency Department (HOSPITAL_BASED_OUTPATIENT_CLINIC_OR_DEPARTMENT_OTHER): Admitting: Radiology

## 2023-12-11 ENCOUNTER — Emergency Department (HOSPITAL_BASED_OUTPATIENT_CLINIC_OR_DEPARTMENT_OTHER)
Admission: EM | Admit: 2023-12-11 | Discharge: 2023-12-11 | Disposition: A | Discharge status: Home / Self Care | Attending: Emergency Medicine | Admitting: Emergency Medicine

## 2023-12-11 DIAGNOSIS — R059 Cough, unspecified: Secondary | ICD-10-CM | POA: Diagnosis not present

## 2023-12-11 DIAGNOSIS — Z7901 Long term (current) use of anticoagulants: Secondary | ICD-10-CM | POA: Diagnosis not present

## 2023-12-11 DIAGNOSIS — I509 Heart failure, unspecified: Secondary | ICD-10-CM | POA: Insufficient documentation

## 2023-12-11 DIAGNOSIS — I4891 Unspecified atrial fibrillation: Secondary | ICD-10-CM | POA: Diagnosis not present

## 2023-12-11 DIAGNOSIS — I700 Atherosclerosis of aorta: Secondary | ICD-10-CM | POA: Diagnosis not present

## 2023-12-11 DIAGNOSIS — R220 Localized swelling, mass and lump, head: Secondary | ICD-10-CM | POA: Diagnosis not present

## 2023-12-11 DIAGNOSIS — R0602 Shortness of breath: Secondary | ICD-10-CM | POA: Diagnosis not present

## 2023-12-11 DIAGNOSIS — I110 Hypertensive heart disease with heart failure: Secondary | ICD-10-CM | POA: Diagnosis not present

## 2023-12-11 LAB — BASIC METABOLIC PANEL WITH GFR
Anion gap: 11 (ref 5–15)
BUN: 14 mg/dL (ref 8–23)
CO2: 25 mmol/L (ref 22–32)
Calcium: 8.6 mg/dL — ABNORMAL LOW (ref 8.9–10.3)
Chloride: 91 mmol/L — ABNORMAL LOW (ref 98–111)
Creatinine, Ser: 0.92 mg/dL (ref 0.44–1.00)
GFR, Estimated: >60 mL/min (ref >60)
Glucose, Bld: 91 mg/dL (ref 70–99)
Potassium: 3.9 mmol/L (ref 3.5–5.1)
Sodium: 128 mmol/L — ABNORMAL LOW (ref 135–145)

## 2023-12-11 LAB — CBC
HCT: 34.3 % — ABNORMAL LOW (ref 36.0–46.0)
Hemoglobin: 11.2 g/dL — ABNORMAL LOW (ref 12.0–15.0)
MCH: 28.8 pg (ref 26.0–34.0)
MCHC: 32.7 g/dL (ref 30.0–36.0)
MCV: 88.2 fL (ref 80.0–100.0)
Platelets: 252 K/uL (ref 150–400)
RBC: 3.89 MIL/uL (ref 3.87–5.11)
RDW: 13.7 % (ref 11.5–15.5)
WBC: 4.3 K/uL (ref 4.0–10.5)
nRBC: 0 % (ref 0.0–0.2)

## 2023-12-11 LAB — TROPONIN T, HIGH SENSITIVITY: Troponin T High Sensitivity: <15 ng/L (ref 0–19)

## 2023-12-11 LAB — PROTIME-INR
INR: 2.1 — ABNORMAL HIGH (ref 0.8–1.2)
Prothrombin Time: 24.6 s — ABNORMAL HIGH (ref 11.4–15.2)

## 2023-12-11 LAB — PRO BRAIN NATRIURETIC PEPTIDE: Pro Brain Natriuretic Peptide: 1372 pg/mL — ABNORMAL HIGH (ref <300.0)

## 2023-12-11 MED ORDER — FUROSEMIDE 10 MG/ML IJ SOLN
20.0000 mg | Freq: Once | INTRAMUSCULAR | Status: AC
  Administered 2023-12-11: 20 mg via INTRAVENOUS
  Filled 2023-12-11: qty 2

## 2023-12-11 NOTE — ED Notes (Signed)
 Pt ambulated successfully in hallway without assistance, SPO2 95-98%, HR 80s

## 2023-12-11 NOTE — ED Triage Notes (Addendum)
 New heart meds started on 11/10- metoprolol (stopped amio due to liver) Hx HCM and afib med changes per cards I think I'm allergic to it Reports facial swelling and sob, cough and  I just dont feel right, fatigue

## 2023-12-11 NOTE — ED Provider Notes (Signed)
 [place] EMERGENCY DEPARTMENT AT [facility] Provider Note   CSN: [id number] Arrival date & time: 12/11/23  1338     Patient presents with: Shortness of Breath   Paula [name] is a 88 y.o. female.  With a history of atrial fibrillation on warfarin, status post ICD placement, heart failure and hypertrophic cardiomyopathy presents to the ED for shortness of breath.  Patient recently has undergone some education changes by her cardiology/primary team.  Was previously on amiodarone  but LFTs were increasing so she was switched to diltiazem  about 2 months ago.  She had been doing well on that but was recently started on metoprolol about 10 days ago.  Since that time she has experienced increased shortness of breath as well as weight gain and facial swelling.  Notes fatigue when walking short distances.  No chest pain fevers chills nausea vomiting other complaints.  Compliant with warfarin    Shortness of Breath      Prior to Admission medications   Medication Sig Start Date End Date Taking? Authorizing Provider  triamcinolone cream (KENALOG) 0.1 % Apply topically. 12/04/23  Yes [provider]  acetaminophen  (TYLENOL ) 325 MG tablet every 4 (four) hours as needed for moderate pain or headache.    [provider]  Calcium  Carb-Cholecalciferol 600-5 MG-MCG TABS daily at 6 (six) AM. Patient takes this Medication in the afternoon not 6am    [provider]  diltiazem  (CARDIZEM  CD) 300 MG 24 hr capsule TAKE 1 CAPSULE EVERY DAY 04/29/23   Kate Lonni CROME, MD  dronedarone (MULTAQ) 400 MG tablet Take 1 tablet (400 mg total) by mouth 2 (two) times daily with a meal. 10/24/23   Waddell Danelle ORN, MD  famotidine  (PEPCID  AC) 10 MG tablet as needed for heartburn or indigestion.    [provider]  furosemide  (LASIX ) 20 MG tablet Take 1 tablet (20 mg total) by mouth daily as needed. 10/23/23   Kate Lonni CROME, MD  levothyroxine  (SYNTHROID )  125 MCG tablet Take 125 mcg by mouth daily before breakfast.    [provider]  linaclotide  (LINZESS ) 290 MCG CAPS capsule Take 1 capsule (290 mcg total) by mouth daily before breakfast. 03/21/22   Odell Celinda Balo, MD  metoprolol succinate (TOPROL XL) 25 MG 24 hr tablet Take 1 tablet (25 mg total) by mouth daily. At night after dinner 12/01/23   Waddell Danelle ORN, MD  Multiple Vitamin (MULTIVITAMIN WITH MINERALS) TABS tablet Take 1 tablet by mouth daily.    [provider]  polyethylene glycol powder (GLYCOLAX /MIRALAX ) 17 GM/SCOOP powder Take 17 g by mouth 2 (two) times daily. 03/20/22   Odell Celinda Balo, MD  prazosin  (MINIPRESS ) 1 MG capsule TAKE 1 CAPSULE AT BEDTIME 05/13/23   Kate Lonni CROME, MD  sodium chloride  (OCEAN) 0.65 % SOLN nasal spray Place 1 spray into both nostrils as needed for congestion.    [provider]  valsartan  (DIOVAN ) 160 MG tablet Take 1 tablet (160 mg total) by mouth daily. 12/01/23   Waddell Danelle ORN, MD  warfarin (COUMADIN ) 4 MG tablet TAKE 1 TABLET BY MOUTH ONCE DAILY OR AS DIRECTED BY ANTICOAGULATION CLINIC 11/04/23   Kate Lonni CROME, MD    Allergies: Eliquis  Alma.arms ] and Wound dressing adhesive    Review of Systems  Respiratory:  Positive for shortness of breath.     Updated Vital Signs BP 101/71 (BP Location: Right Arm)   Pulse 76   Temp 98.1 F (36.7 C) (Oral)   Resp 16  SpO2 94%   Physical Exam Vitals and nursing note reviewed.  HENT:     Head: Normocephalic and atraumatic.  Eyes:     Pupils: Pupils are equal, round, and reactive to light.  Cardiovascular:     Rate and Rhythm: Normal rate. Rhythm irregular.  Pulmonary:     Effort: Pulmonary effort is normal.     Breath sounds: Normal breath sounds.  Abdominal:     Palpations: Abdomen is soft.     Tenderness: There is no abdominal tenderness.  Skin:    General: Skin is warm and dry.  Neurological:     Mental Status: She is alert.   Psychiatric:        Mood and Affect: Mood normal.     (all labs ordered are listed, but only abnormal results are displayed) Labs Reviewed  BASIC METABOLIC PANEL WITH GFR - Abnormal; Notable for the following components:      Result Value   Sodium 128 (*)    Chloride 91 (*)    Calcium  8.6 (*)    All other components within normal limits  CBC - Abnormal; Notable for the following components:   Hemoglobin 11.2 (*)    HCT 34.3 (*)    All other components within normal limits  PROTIME-INR - Abnormal; Notable for the following components:   Prothrombin Time 24.6 (*)    INR 2.1 (*)    All other components within normal limits  PRO BRAIN NATRIURETIC PEPTIDE - Abnormal; Notable for the following components:   Pro Brain Natriuretic Peptide 1,372.0 (*)    All other components within normal limits  TROPONIN T, HIGH SENSITIVITY  TROPONIN T, HIGH SENSITIVITY    EKG: EKG Interpretation Date/Time:  Thursday December 11 2023 13:46:07 EST Ventricular Rate:  83 PR Interval:    QRS Duration:  100 QT Interval:  465 QTC Calculation: 547 R Axis:   26  Text Interpretation: Atrial fibrillation Probable anterior infarct, age indeterminate Prolonged QT interval Confirmed by Pamella Sharper [phone]) on 12/11/2023 4:58:56 PM  Radiology: [facility] Chest 2 View Result Date: 12/11/2023 CLINICAL DATA:  Facial swelling and shortness of breath and cough. EXAM: CHEST - 2 VIEW COMPARISON:  01/16/2022 FINDINGS: The pacer wires are stable. No complicating features. The heart is borderline enlarged but stable. Stable tortuosity and calcification of the thoracic aorta. The lungs are clear of an acute process. No infiltrates, edema or effusions. Mild chronic left pleural thickening. No pulmonary lesions or pneumothorax. IMPRESSION: No acute cardiopulmonary findings. Electronically Signed   By: MYRTIS Stammer M.D.   On: 12/11/2023 14:36     Procedures   Medications Ordered in the ED  furosemide  (LASIX ) injection 20  mg (20 mg Intravenous Given 12/11/23 1741)    Clinical Course as of 12/11/23 1806  Thu Dec 11, 2023  1802 O2 saturation meter remained in the high 90s when walking around department.  Appropriate for discharge with close PCP follow-up. [MP]    Clinical Course User Index [MP] Pamella Sharper LABOR, DO                                 Medical Decision Making 88 year old female with history as above presented to the ED for shortness of breath.  10 days of increasing shortness of breath facial swelling weight gain.  Worse with exertion.  Afebrile hemodynamically stable.  Rate controlled A-fib.  BNP elevated above 1000 and.  Some mild vascular congestion  on chest x-ray but no overt pulmonary edema or effusion.  Hyponatremia 128.  Instructed on fluid restriction.  Suspect mild heart failure exacerbation as etiology of her symptoms.  Will give a dose of Lasix  here and attempt ambulatory trial to see if she becomes very short of breath or desaturate  Amount and/or Complexity of Data Reviewed Labs: ordered. Radiology: ordered.  Risk Prescription drug management.        Final diagnoses:  Acute on chronic congestive heart failure, unspecified heart failure type [facility])    ED Discharge Orders     None          Pamella Ozell LABOR, DO 12/11/23 1806

## 2023-12-11 NOTE — Telephone Encounter (Signed)
 It looks like she was back in A-fib at recent appointment with EP, wonder if this is put her into heart failure.  Agree with going to ED.  If she is declining ED, can we get her in for close follow-up?  I am out the rest of this week, any APP openings?  If not we could add her to my schedule on Monday at 10 AM

## 2023-12-11 NOTE — Discharge Instructions (Signed)
 You were seen in the emergency room for shortness of breath weight gain facial swelling The blood work shows low sodium which will need to be rechecked by your primary doctor Otherwise Your blood work chest x-ray looks okay this is most likely a mild heart failure exacerbation You did not need to come to the hospital for this but you should take an extra dose of Lasix  tomorrow Instead of taking 1 tablet, take 2 tomorrow Call your primary doctor's office to schedule an appointment for reevaluation Return to the Emergency Department if your breathing gets worse if you have chest pain or any other concerns

## 2023-12-12 DIAGNOSIS — I509 Heart failure, unspecified: Secondary | ICD-10-CM | POA: Diagnosis not present

## 2023-12-15 ENCOUNTER — Telehealth: Payer: Self-pay

## 2023-12-15 ENCOUNTER — Encounter: Payer: Self-pay | Admitting: Internal Medicine

## 2023-12-15 NOTE — Telephone Encounter (Signed)
 Transmission last came through on 10/26/23.  No alert transmissions sent since.  Patient said sound was like a whistling sound, she has had some wheezing due to a cold and thinks maybe that's what it was.  She feels fine now and is not concerned.  Does not feel necessary to send a manual transmission.   She will call us  back if it happens again and send one then if needed.

## 2023-12-15 NOTE — Telephone Encounter (Signed)
 Pt called in LVM on DC phone stating that she heard a noise coming from her implanted device and wants to know if any alerts came in over the weekend. Pt feels fine no symptoms

## 2023-12-15 NOTE — Telephone Encounter (Signed)
 Yes I wanted to make a spot for her at 10 AM but unfortunately this was not done.  Can we please make a spot for her at my next quarter day on 12/5, would add her on at 10 AM

## 2023-12-18 ENCOUNTER — Encounter (HOSPITAL_BASED_OUTPATIENT_CLINIC_OR_DEPARTMENT_OTHER): Payer: Self-pay | Admitting: Emergency Medicine

## 2023-12-18 ENCOUNTER — Other Ambulatory Visit: Payer: Self-pay

## 2023-12-18 ENCOUNTER — Inpatient Hospital Stay (HOSPITAL_BASED_OUTPATIENT_CLINIC_OR_DEPARTMENT_OTHER)
Admission: EM | Admit: 2023-12-18 | Discharge: 2023-12-20 | DRG: 291 | Disposition: A | Discharge status: Home / Self Care | Attending: Hospitalist | Admitting: Hospitalist

## 2023-12-18 ENCOUNTER — Telehealth: Payer: Self-pay | Admitting: Cardiology

## 2023-12-18 ENCOUNTER — Emergency Department (HOSPITAL_BASED_OUTPATIENT_CLINIC_OR_DEPARTMENT_OTHER)

## 2023-12-18 DIAGNOSIS — D649 Anemia, unspecified: Secondary | ICD-10-CM | POA: Diagnosis not present

## 2023-12-18 DIAGNOSIS — Z7901 Long term (current) use of anticoagulants: Secondary | ICD-10-CM | POA: Diagnosis not present

## 2023-12-18 DIAGNOSIS — I081 Rheumatic disorders of both mitral and tricuspid valves: Secondary | ICD-10-CM | POA: Diagnosis not present

## 2023-12-18 DIAGNOSIS — I421 Obstructive hypertrophic cardiomyopathy: Secondary | ICD-10-CM | POA: Diagnosis present

## 2023-12-18 DIAGNOSIS — Z8249 Family history of ischemic heart disease and other diseases of the circulatory system: Secondary | ICD-10-CM

## 2023-12-18 DIAGNOSIS — Z888 Allergy status to other drugs, medicaments and biological substances status: Secondary | ICD-10-CM

## 2023-12-18 DIAGNOSIS — I110 Hypertensive heart disease with heart failure: Principal | ICD-10-CM | POA: Diagnosis present

## 2023-12-18 DIAGNOSIS — Z803 Family history of malignant neoplasm of breast: Secondary | ICD-10-CM

## 2023-12-18 DIAGNOSIS — Z79899 Other long term (current) drug therapy: Secondary | ICD-10-CM

## 2023-12-18 DIAGNOSIS — I5033 Acute on chronic diastolic (congestive) heart failure: Secondary | ICD-10-CM | POA: Diagnosis present

## 2023-12-18 DIAGNOSIS — I50811 Acute right heart failure: Secondary | ICD-10-CM | POA: Diagnosis present

## 2023-12-18 DIAGNOSIS — R0602 Shortness of breath: Secondary | ICD-10-CM | POA: Diagnosis not present

## 2023-12-18 DIAGNOSIS — E785 Hyperlipidemia, unspecified: Secondary | ICD-10-CM | POA: Diagnosis present

## 2023-12-18 DIAGNOSIS — E876 Hypokalemia: Secondary | ICD-10-CM | POA: Diagnosis present

## 2023-12-18 DIAGNOSIS — E871 Hypo-osmolality and hyponatremia: Secondary | ICD-10-CM | POA: Diagnosis present

## 2023-12-18 DIAGNOSIS — Z91048 Other nonmedicinal substance allergy status: Secondary | ICD-10-CM

## 2023-12-18 DIAGNOSIS — I4891 Unspecified atrial fibrillation: Secondary | ICD-10-CM | POA: Diagnosis not present

## 2023-12-18 DIAGNOSIS — Z950 Presence of cardiac pacemaker: Secondary | ICD-10-CM

## 2023-12-18 DIAGNOSIS — I4819 Other persistent atrial fibrillation: Secondary | ICD-10-CM | POA: Diagnosis present

## 2023-12-18 DIAGNOSIS — Z90711 Acquired absence of uterus with remaining cervical stump: Secondary | ICD-10-CM

## 2023-12-18 DIAGNOSIS — I509 Heart failure, unspecified: Secondary | ICD-10-CM | POA: Diagnosis not present

## 2023-12-18 DIAGNOSIS — I5031 Acute diastolic (congestive) heart failure: Secondary | ICD-10-CM

## 2023-12-18 DIAGNOSIS — E039 Hypothyroidism, unspecified: Secondary | ICD-10-CM | POA: Diagnosis not present

## 2023-12-18 DIAGNOSIS — Z79890 Hormone replacement therapy (postmenopausal): Secondary | ICD-10-CM

## 2023-12-18 LAB — BASIC METABOLIC PANEL WITH GFR
Anion gap: 13 (ref 5–15)
BUN: 12 mg/dL (ref 8–23)
CO2: 25 mmol/L (ref 22–32)
Calcium: 8.9 mg/dL (ref 8.9–10.3)
Chloride: 88 mmol/L — ABNORMAL LOW (ref 98–111)
Creatinine, Ser: 1.04 mg/dL — ABNORMAL HIGH (ref 0.44–1.00)
GFR, Estimated: 52 mL/min — ABNORMAL LOW (ref >60)
Glucose, Bld: 160 mg/dL — ABNORMAL HIGH (ref 70–99)
Potassium: 3.4 mmol/L — ABNORMAL LOW (ref 3.5–5.1)
Sodium: 125 mmol/L — ABNORMAL LOW (ref 135–145)

## 2023-12-18 LAB — CBC WITH DIFFERENTIAL/PLATELET
Abs Immature Granulocytes: 0.04 K/uL (ref 0.00–0.07)
Basophils Absolute: 0 K/uL (ref 0.0–0.1)
Basophils Relative: 0 %
Eosinophils Absolute: 0.1 K/uL (ref 0.0–0.5)
Eosinophils Relative: 1 %
HCT: 32.9 % — ABNORMAL LOW (ref 36.0–46.0)
Hemoglobin: 11 g/dL — ABNORMAL LOW (ref 12.0–15.0)
Immature Granulocytes: 1 %
Lymphocytes Relative: 13 %
Lymphs Abs: 0.8 K/uL (ref 0.7–4.0)
MCH: 28.6 pg (ref 26.0–34.0)
MCHC: 33.4 g/dL (ref 30.0–36.0)
MCV: 85.7 fL (ref 80.0–100.0)
Monocytes Absolute: 0.6 K/uL (ref 0.1–1.0)
Monocytes Relative: 9 %
Neutro Abs: 4.9 K/uL (ref 1.7–7.7)
Neutrophils Relative %: 76 %
Platelets: 269 K/uL (ref 150–400)
RBC: 3.84 MIL/uL — ABNORMAL LOW (ref 3.87–5.11)
RDW: 13.4 % (ref 11.5–15.5)
WBC: 6.5 K/uL (ref 4.0–10.5)
nRBC: 0 % (ref 0.0–0.2)

## 2023-12-18 LAB — HEPATIC FUNCTION PANEL
ALT: 45 U/L — ABNORMAL HIGH (ref 0–44)
AST: 47 U/L — ABNORMAL HIGH (ref 15–41)
Albumin: 3.7 g/dL (ref 3.5–5.0)
Alkaline Phosphatase: 92 U/L (ref 38–126)
Bilirubin, Direct: 0.3 mg/dL — ABNORMAL HIGH (ref 0.0–0.2)
Indirect Bilirubin: 0.3 mg/dL (ref 0.3–0.9)
Total Bilirubin: 0.5 mg/dL (ref 0.0–1.2)
Total Protein: 6.6 g/dL (ref 6.5–8.1)

## 2023-12-18 LAB — PROTIME-INR
INR: 3 — ABNORMAL HIGH (ref 0.8–1.2)
Prothrombin Time: 32.7 s — ABNORMAL HIGH (ref 11.4–15.2)

## 2023-12-18 LAB — PRO BRAIN NATRIURETIC PEPTIDE: Pro Brain Natriuretic Peptide: 2070 pg/mL — ABNORMAL HIGH (ref <300.0)

## 2023-12-18 LAB — TSH: TSH: 15.804 u[IU]/mL — ABNORMAL HIGH (ref 0.350–4.500)

## 2023-12-18 LAB — SODIUM
Sodium: 128 mmol/L — ABNORMAL LOW (ref 135–145)
Sodium: 128 mmol/L — ABNORMAL LOW (ref 135–145)

## 2023-12-18 LAB — RESP PANEL BY RT-PCR (RSV, FLU A&B, COVID)  RVPGX2
Influenza A by PCR: NEGATIVE
Influenza B by PCR: NEGATIVE
Resp Syncytial Virus by PCR: NEGATIVE
SARS Coronavirus 2 by RT PCR: NEGATIVE

## 2023-12-18 LAB — OSMOLALITY: Osmolality: 266 mosm/kg — ABNORMAL LOW (ref 275–295)

## 2023-12-18 LAB — T4, FREE: Free T4: 1.11 ng/dL (ref 0.61–1.12)

## 2023-12-18 LAB — TROPONIN T, HIGH SENSITIVITY
Troponin T High Sensitivity: 17 ng/L (ref 0–19)
Troponin T High Sensitivity: <15 ng/L (ref 0–19)

## 2023-12-18 LAB — URIC ACID: Uric Acid, Serum: 3.6 mg/dL (ref 2.5–7.1)

## 2023-12-18 MED ORDER — ACETAMINOPHEN 650 MG RE SUPP: 650.0000 mg | Freq: Four times a day (QID) | RECTAL | Status: DC | PRN

## 2023-12-18 MED ORDER — WARFARIN - PHARMACIST DOSING INPATIENT
Freq: Every day | Status: DC
  Administered 2023-12-19: 1

## 2023-12-18 MED ORDER — ONDANSETRON HCL 4 MG/2ML IJ SOLN: 4.0000 mg | Freq: Four times a day (QID) | INTRAMUSCULAR | Status: DC | PRN

## 2023-12-18 MED ORDER — LEVOTHYROXINE SODIUM 25 MCG PO TABS
125.0000 ug | ORAL_TABLET | Freq: Every day | ORAL | Status: DC
  Administered 2023-12-19 – 2023-12-20 (×2): 125 ug via ORAL
  Filled 2023-12-18 (×2): qty 1

## 2023-12-18 MED ORDER — METOPROLOL SUCCINATE ER 25 MG PO TB24
25.0000 mg | ORAL_TABLET | Freq: Every day | ORAL | Status: DC
  Administered 2023-12-18 – 2023-12-20 (×3): 25 mg via ORAL
  Filled 2023-12-18 (×3): qty 1

## 2023-12-18 MED ORDER — DRONEDARONE HCL 400 MG PO TABS
400.0000 mg | ORAL_TABLET | Freq: Two times a day (BID) | ORAL | Status: DC
  Administered 2023-12-19 – 2023-12-20 (×3): 400 mg via ORAL
  Filled 2023-12-18 (×4): qty 1

## 2023-12-18 MED ORDER — BISACODYL 5 MG PO TBEC: 5.0000 mg | DELAYED_RELEASE_TABLET | Freq: Every day | ORAL | Status: DC | PRN

## 2023-12-18 MED ORDER — POLYETHYLENE GLYCOL 3350 17 G PO PACK: 17.0000 g | PACK | Freq: Every day | ORAL | Status: DC | PRN

## 2023-12-18 MED ORDER — SODIUM CHLORIDE 0.9% FLUSH
3.0000 mL | Freq: Two times a day (BID) | INTRAVENOUS | Status: DC
  Administered 2023-12-18 – 2023-12-20 (×4): 3 mL via INTRAVENOUS

## 2023-12-18 MED ORDER — ONDANSETRON HCL 4 MG PO TABS: 4.0000 mg | ORAL_TABLET | Freq: Four times a day (QID) | ORAL | Status: DC | PRN

## 2023-12-18 MED ORDER — ACETAMINOPHEN 325 MG PO TABS: 650.0000 mg | ORAL_TABLET | Freq: Four times a day (QID) | ORAL | Status: DC | PRN

## 2023-12-18 MED ORDER — FUROSEMIDE 10 MG/ML IJ SOLN
20.0000 mg | Freq: Once | INTRAMUSCULAR | Status: AC
  Administered 2023-12-18: 20 mg via INTRAVENOUS
  Filled 2023-12-18: qty 2

## 2023-12-18 MED ORDER — DILTIAZEM HCL ER COATED BEADS 180 MG PO CP24
300.0000 mg | ORAL_CAPSULE | Freq: Every day | ORAL | Status: DC
  Administered 2023-12-18 – 2023-12-20 (×3): 300 mg via ORAL
  Filled 2023-12-18 (×3): qty 1

## 2023-12-18 MED ORDER — FUROSEMIDE 10 MG/ML IJ SOLN: 20.0000 mg | Freq: Two times a day (BID) | INTRAMUSCULAR | Status: DC

## 2023-12-18 MED ORDER — PRAZOSIN HCL 1 MG PO CAPS
1.0000 mg | ORAL_CAPSULE | Freq: Every day | ORAL | Status: DC
  Administered 2023-12-18 – 2023-12-19 (×2): 1 mg via ORAL
  Filled 2023-12-18 (×3): qty 1

## 2023-12-18 MED ORDER — FUROSEMIDE 10 MG/ML IJ SOLN
20.0000 mg | Freq: Two times a day (BID) | INTRAMUSCULAR | Status: AC
  Administered 2023-12-19 (×2): 20 mg via INTRAVENOUS
  Filled 2023-12-18 (×2): qty 2

## 2023-12-18 NOTE — ED Provider Notes (Signed)
 [place] EMERGENCY DEPARTMENT AT [facility] Provider Note   CSN: [id number] Arrival date & time: 12/18/23  1437     Patient presents with: Shortness of Breath   Paula [name] is a 88 y.o. female.   Pt is a 88 yo female with pmhx significant for apical hypertrophic cardiomyopathy, paroxysmal atrial fibrillation (on coumadin ), hypertension, hypothyroidism and CHF.  She was here on 11/20 for SOB and was given a dose of IV lasix , felt better and was d/c home.  She has had increased SOB and has gained about 10 lbs in the last week.  She tried to get through Thanksgiving, but SOB has worsened.       Prior to Admission medications   Medication Sig Start Date End Date Taking? Authorizing Provider  acetaminophen  (TYLENOL ) 325 MG tablet every 4 (four) hours as needed for moderate pain or headache.    [provider]  Calcium  Carb-Cholecalciferol 600-5 MG-MCG TABS daily at 6 (six) AM. Patient takes this Medication in the afternoon not 6am    [provider]  diltiazem  (CARDIZEM  CD) 300 MG 24 hr capsule TAKE 1 CAPSULE EVERY DAY 04/29/23   Kate Lonni CROME, MD  dronedarone  (MULTAQ ) 400 MG tablet Take 1 tablet (400 mg total) by mouth 2 (two) times daily with a meal. 10/24/23   Waddell Danelle ORN, MD  famotidine  (PEPCID  AC) 10 MG tablet as needed for heartburn or indigestion.    [provider]  furosemide  (LASIX ) 20 MG tablet Take 1 tablet (20 mg total) by mouth daily as needed. 10/23/23   Kate Lonni CROME, MD  levothyroxine  (SYNTHROID ) 125 MCG tablet Take 125 mcg by mouth daily before breakfast.    [provider]  linaclotide  (LINZESS ) 290 MCG CAPS capsule Take 1 capsule (290 mcg total) by mouth daily before breakfast. 03/21/22   Odell Celinda Balo, MD  metoprolol  succinate (TOPROL  XL) 25 MG 24 hr tablet Take 1 tablet (25 mg total) by mouth daily. At night after dinner 12/01/23   Waddell Danelle ORN, MD  Multiple Vitamin (MULTIVITAMIN  WITH MINERALS) TABS tablet Take 1 tablet by mouth daily.    [provider]  polyethylene glycol powder (GLYCOLAX /MIRALAX ) 17 GM/SCOOP powder Take 17 g by mouth 2 (two) times daily. 03/20/22   Odell Celinda Balo, MD  prazosin  (MINIPRESS ) 1 MG capsule TAKE 1 CAPSULE AT BEDTIME 05/13/23   Kate Lonni CROME, MD  sodium chloride  (OCEAN) 0.65 % SOLN nasal spray Place 1 spray into both nostrils as needed for congestion.    [provider]  triamcinolone cream (KENALOG) 0.1 % Apply topically. 12/04/23   [provider]  valsartan  (DIOVAN ) 160 MG tablet Take 1 tablet (160 mg total) by mouth daily. 12/01/23   Waddell Danelle ORN, MD  warfarin (COUMADIN ) 4 MG tablet TAKE 1 TABLET BY MOUTH ONCE DAILY OR AS DIRECTED BY ANTICOAGULATION CLINIC 11/04/23   Kate Lonni CROME, MD    Allergies: Eliquis  [apixaban ] and Wound dressing adhesive    Review of Systems  Respiratory:  Positive for shortness of breath.   All other systems reviewed and are negative.   Updated Vital Signs BP (!) 95/52   Pulse 70   Temp 98.5 F (36.9 C) (Oral)   Resp 18   SpO2 95%   Physical Exam Vitals and nursing note reviewed.  Constitutional:      Appearance: She is well-developed.  HENT:     Head: Normocephalic and atraumatic.     Mouth/Throat:  Mouth: Mucous membranes are moist.     Pharynx: Oropharynx is clear.  Eyes:     Extraocular Movements: Extraocular movements intact.     Pupils: Pupils are equal, round, and reactive to light.  Cardiovascular:     Rate and Rhythm: Normal rate. Rhythm irregular.  Pulmonary:     Effort: Pulmonary effort is normal.     Breath sounds: Normal breath sounds.  Abdominal:     General: Bowel sounds are normal.     Palpations: Abdomen is soft.  Musculoskeletal:        General: Normal range of motion.     Cervical back: Normal range of motion and neck supple.  Skin:    General: Skin is warm.     Capillary Refill: Capillary refill takes less  than 2 seconds.  Neurological:     General: No focal deficit present.     Mental Status: She is alert and oriented to person, place, and time.  Psychiatric:        Mood and Affect: Mood normal.        Behavior: Behavior normal.     (all labs ordered are listed, but only abnormal results are displayed) Labs Reviewed  BASIC METABOLIC PANEL WITH GFR - Abnormal; Notable for the following components:      Result Value   Sodium 125 (*)    Potassium 3.4 (*)    Chloride 88 (*)    Glucose, Bld 160 (*)    Creatinine, Ser 1.04 (*)    GFR, Estimated 52 (*)    All other components within normal limits  CBC WITH DIFFERENTIAL/PLATELET - Abnormal; Notable for the following components:   RBC 3.84 (*)    Hemoglobin 11.0 (*)    HCT 32.9 (*)    All other components within normal limits  PRO BRAIN NATRIURETIC PEPTIDE - Abnormal; Notable for the following components:   Pro Brain Natriuretic Peptide 2,070.0 (*)    All other components within normal limits  HEPATIC FUNCTION PANEL - Abnormal; Notable for the following components:   AST 47 (*)    ALT 45 (*)    Bilirubin, Direct 0.3 (*)    All other components within normal limits  PROTIME-INR - Abnormal; Notable for the following components:   Prothrombin Time 32.7 (*)    INR 3.0 (*)    All other components within normal limits  RESP PANEL BY RT-PCR (RSV, FLU A&B, COVID)  RVPGX2  TROPONIN T, HIGH SENSITIVITY  TROPONIN T, HIGH SENSITIVITY    EKG: None  EKG is not crossing over.  HR 70.  AFib.  LBBB.  No significant change compared to prior.  Radiology: [facility] Chest Port 1 View Result Date: 12/18/2023 CLINICAL DATA:  Shortness of breath EXAM: PORTABLE CHEST 1 VIEW COMPARISON:  12/11/2023 FINDINGS: Single frontal view of the chest demonstrates stable dual lead pacemaker. Cardiac silhouette is enlarged but stable. No acute airspace disease, effusion, or pneumothorax. No acute bony abnormalities. IMPRESSION: 1. Stable chest, no acute process.  Electronically Signed   By: Ozell Daring M.D.   On: 12/18/2023 15:22     Procedures   Medications Ordered in the ED  furosemide  (LASIX ) injection 20 mg (20 mg Intravenous Given 12/18/23 1537)                                    Medical Decision Making Amount and/or Complexity of Data Reviewed Labs: ordered. Radiology:  ordered.  Risk Prescription drug management. Decision regarding hospitalization.   This patient presents to the ED for concern of sob, this involves an extensive number of treatment options, and is a complaint that carries with it a high risk of complications and morbidity.  The differential diagnosis includes covid/flu/rsv, chf, pna   Co morbidities that complicate the patient evaluation  apical hypertrophic cardiomyopathy, paroxysmal atrial fibrillation (on coumadin ), hypertension, hypothyroidism and CHF   Additional history obtained:  Additional history obtained from epic chart review External records from outside source obtained and reviewed including family   Lab Tests:  I Ordered, and personally interpreted labs.  The pertinent results include:  cbc with hgb low at 11 (stable); bmp with na low at 125 (128 on 11/20), cr 1.04 (0.92 on 11/20); bnp elevated at 2070 (1372 on 11/20); LFTs with AST and ALT sl elevated at 47 and 45.  INR 3.0; trop nl at 17;    Imaging Studies ordered:  I ordered imaging studies including cxr  I independently visualized and interpreted imaging which showed Stable chest, no acute process.  I agree with the radiologist interpretation   Cardiac Monitoring:  The patient was maintained on a cardiac monitor.  I personally viewed and interpreted the cardiac monitored which showed an underlying rhythm of: afib   Medicines ordered and prescription drug management:  I ordered medication including lasix   for sx  Reevaluation of the patient after these medicines showed that the patient improved I have reviewed the patients  home medicines and have made adjustments as needed   Test Considered:  ct   Critical Interventions:  lasix    Consultations Obtained:  I requested consultation with the hospitalist (Dr. Juvenal),  and discussed lab and imaging findings as well as pertinent plan -she will admit   Problem List / ED Course:  CHF exacerbation:  SOB is improved after lasix , but she's still SOB.  BNP is increasing despite lasix  at home. Hyponatremia:  worsening.  Likely due to dilutional effect and maybe to lasix . Afib.  Persistent.  On coumadin . CHA2DS2/VAS Stroke Risk Points  Current as of 10 minutes ago     5 >= 2 Points: High Risk  1 to 1.99 Points: Medium Risk  0 Points: Low Risk    No Change      Details    This score determines the patient's risk of having a stroke if the  patient has atrial fibrillation.       Points Metrics  1 Has Congestive Heart Failure:  Yes    Current as of 10 minutes ago  0 Has Vascular Disease:  No    Current as of 10 minutes ago  1 Has Hypertension:  Yes    Current as of 10 minutes ago  2 Age:  27    Current as of 10 minutes ago  0 Has Diabetes Excluding Gestational Diabetes:  No    Current as of 10 minutes ago  0 Had Stroke:  No  Had TIA:  No  Had Thromboembolism:  No    Current as of 10 minutes ago  1 Female:  Yes    Current as of 10 minutes ago             Reevaluation:  After the interventions noted above, I reevaluated the patient and found that they have :improved   Social Determinants of Health:  Lives at home   Dispostion:  After consideration of the diagnostic results and the patients response to treatment,  I feel that the patent would benefit from admission.       Final diagnoses:  Acute on chronic congestive heart failure, unspecified heart failure type [facility])  Atrial fibrillation, persistent (HCC)  Anticoagulated on Coumadin   Hyponatremia    ED Discharge Orders     None          Dean Clarity, MD 12/18/23  1644

## 2023-12-18 NOTE — Progress Notes (Signed)
  ANTICOAGULATION CONSULT NOTE  Pharmacy Consult for warfarin Indication: Afib, apical thrombus 03/2022  Allergies  Allergen Reactions   Eliquis  [Apixaban ] Other (See Comments)    Doesn't Work for her   Wound Dressing Adhesive Rash    Patient Measurements: Height: 5' 2 (157.5 cm) Weight: 64.6 kg (142 lb 6.7 oz) IBW/kg (Calculated) : 50.1  Vital Signs: Temp: 97.5 F (36.4 C) (11/27 1940) Temp Source: Oral (11/27 1940) BP: 123/73 (11/27 1940) Pulse Rate: 72 (11/27 1940)  Labs: Recent Labs    12/18/23 1451  HGB 11.0*  HCT 32.9*  PLT 269  LABPROT 32.7*  INR 3.0*  CREATININE 1.04*    Estimated Creatinine Clearance: 34.3 mL/min (A) (by C-G formula based on SCr of 1.04 mg/dL (H)).  Medical History: Past Medical History:  Diagnosis Date   Anemia    Atrial fibrillation (HCC)    CHF (congestive heart failure) (HCC)    DVT (deep venous thrombosis) (HCC)    in her heart   Hyperlipidemia    Hypertension    Hypothyroidism    Irregular heart rate     Medications:  See MAR  Assessment: 48 yoM admitted with CHF exacerbation.  Pharmacy consulted for warfarin dosing.  PTA warfarin regimen - 4 mg daily per 11/10 office visit (4 mg tablets) Notable DDI's - amiodarone  (PTA)  INR 3 today on upper end of therapeutic range (in setting of volume overload).  Last warfarin dose 11/27.  Anticipate will be become less sensitive to warfarin with diuresis.   Goal of Therapy:  INR 2-3 Monitor platelets by anticoagulation protocol: Yes   Plan:  HOLD warfarin  Daily INR, CBC Monitor for s/sx of bleeding  Maurilio Fila, PharmD Clinical Pharmacist 12/18/2023  8:44 PM

## 2023-12-18 NOTE — ED Triage Notes (Signed)
 I still have fluid and feeling sob Seen on 12/11/23 for similar doe

## 2023-12-18 NOTE — Telephone Encounter (Signed)
 Patient's family called the answering service this afternoon concerned that patient has gained quite a bit of fluid in the past week.  Patient had been seen in the ED on 11/20 with shortness of breath, fatigue, abdominal distention.  There she was given 1 dose of IV Lasix .  Since then, she has been reaccumulating fluid.  Estimates about a 10 pound weight gain in 1 week.  Has significant abdominal swelling and ankle edema.  Family notes that she was short of breath when walking a short distance earlier today and while sitting at rest was having some pursed lip breathing.  Patient has been on Lasix  20 mg daily at home.  Recommended that patient go to the ED for IV diuresis given her significant weight gain.  Patient and family are in agreement.  Plan to go to the [facility].  I will forward this message to Dr. Madilyn.  Patient's next appointment is not until early January.  Likely will need closer follow-up.

## 2023-12-18 NOTE — H&P (Signed)
 History and Physical    Patient: Paula [name] FMW:[id number] DOB: September 17, 1935 DOA: 12/18/2023 DOS: the patient was seen and examined on 12/18/2023 . PCP: Charlott Dorn LABOR, MD  Patient coming from: [facility]. Chief complaint: Chief Complaint  Patient presents with   Shortness of Breath   HPI:  Paula [name] is a 88 y.o. female with past medical history  of  apical hypertrophic cardiomyopathy, history of small bowel obstruction, paroxysmal atrial fibrillation (on coumadin ), hypertension, hypothyroidism and CHF being admitted today for CHF exacerbation.  Next chart review shows telephone note Above patient having complaints of shortness of breath fatigue abdominal distention weight gain.  Reports about 10 pound weight gain in about the past 2 week also abdominal swelling >lower extremity edema bilaterally and now notices shortness of breath with ambulating.  Patient evaluated at [facility] and transferred to [facility] for ongoing care, patient's most recent echo was in March 2025 she does have an upcoming echocardiogram scheduled.  March echocardiogram shows EF of 60-65, severe LVH, indeterminate diastolic parameters.  Elevated pulmonary artery pressures, dilated left atrium, moderate MR, severe TR.  Reviewed pacemaker check on 10 November which showed episodes of a flutter and A-fib with uncontrolled rates. No reports of chest pain headaches nausea vomiting dizziness or loss of consciousness or falls. Patient was also seen on 20 November at [facility] for shortness of breath, EDMD documents that patient's amiodarone  was switched to diltiazem  due to elevated LFTs.   ED Course:  Vital signs in the ED were notable for the following:  Vitals:   12/18/23 1725 12/18/23 1809 12/18/23 1811 12/18/23 1940  BP: 120/65 127/83  123/73  Pulse: 70 70  72  Temp:  97.9 F (36.6 C)  (!) 97.5 F (36.4 C)  Resp: 19   18  Height:  5' 2 (1.575 m) 5' 2 (1.575 m)   Weight:  64.6 kg 64.6 kg    SpO2: 100% 100%  99%  TempSrc:  Oral  Oral  BMI (Calculated):  26.04 26.04    >>ED evaluation thus far shows: Initial CMP shows sodium 125 which is chronic on trend review, potassium 3.4 chloride 88 glucose 168 creatinine 1.04 eGFR 52 mild AST ALT elevation at 47 and 45 respectively. proBNP at 2070, initial troponin of 17. Initial CBC with a normal white count of 6.5 hemoglobin of 11 but seems to be chronic since 2022 as far as our labs and platelets of 269. INR of 3.0. Initial chest x-ray shows stable chest with no acute process.  >>While in the ED patient received the following: Medications  furosemide  (LASIX ) injection 20 mg (20 mg Intravenous Given 12/18/23 1537)   Review of Systems  Respiratory:  Positive for shortness of breath.   Cardiovascular:  Positive for leg swelling.   Past Medical History:  Diagnosis Date   Anemia    Atrial fibrillation (HCC)    CHF (congestive heart failure) (HCC)    DVT (deep venous thrombosis) (HCC)    in her heart   Hyperlipidemia    Hypertension    Hypothyroidism    Irregular heart rate    Past Surgical History:  Procedure Laterality Date   APPENDECTOMY     in early 1970's   PACEMAKER IMPLANT N/A 07/17/2020   Procedure: PACEMAKER IMPLANT;  Surgeon: Waddell Danelle ORN, MD;  Location: [facility];  Service: Cardiovascular;  Laterality: N/A;   salvia gland  2012   Dr Carlie   VAGINAL HYSTERECTOMY     partial  reports that she has never smoked. She has never used smokeless tobacco. She reports that she does not drink alcohol and does not use drugs. Allergies  Allergen Reactions   Eliquis  [Apixaban ] Other (See Comments)    Doesn't Work for her   Wound Dressing Adhesive Rash   Family History  Problem Relation Age of Onset   Other Mother        heart attack or aneursym   Cancer Father        blood cancer   Breast cancer Sister 1   Colon cancer Neg Hx    Esophageal cancer Neg Hx    Rectal cancer Neg Hx    Prior to  Admission medications   Medication Sig Start Date End Date Taking? Authorizing Provider  acetaminophen  (TYLENOL ) 325 MG tablet every 4 (four) hours as needed for moderate pain or headache.    [provider]  Calcium  Carb-Cholecalciferol 600-5 MG-MCG TABS daily at 6 (six) AM. Patient takes this Medication in the afternoon not 6am    [provider]  diltiazem  (CARDIZEM  CD) 300 MG 24 hr capsule TAKE 1 CAPSULE EVERY DAY 04/29/23   Kate Lonni CROME, MD  dronedarone  (MULTAQ ) 400 MG tablet Take 1 tablet (400 mg total) by mouth 2 (two) times daily with a meal. 10/24/23   Waddell Danelle ORN, MD  famotidine  (PEPCID  AC) 10 MG tablet as needed for heartburn or indigestion.    [provider]  furosemide  (LASIX ) 20 MG tablet Take 1 tablet (20 mg total) by mouth daily as needed. 10/23/23   Kate Lonni CROME, MD  levothyroxine  (SYNTHROID ) 125 MCG tablet Take 125 mcg by mouth daily before breakfast.    [provider]  linaclotide  (LINZESS ) 290 MCG CAPS capsule Take 1 capsule (290 mcg total) by mouth daily before breakfast. 03/21/22   Odell Celinda Balo, MD  metoprolol  succinate (TOPROL  XL) 25 MG 24 hr tablet Take 1 tablet (25 mg total) by mouth daily. At night after dinner 12/01/23   Waddell Danelle ORN, MD  Multiple Vitamin (MULTIVITAMIN WITH MINERALS) TABS tablet Take 1 tablet by mouth daily.    [provider]  polyethylene glycol powder (GLYCOLAX /MIRALAX ) 17 GM/SCOOP powder Take 17 g by mouth 2 (two) times daily. 03/20/22   Odell Celinda Balo, MD  prazosin  (MINIPRESS ) 1 MG capsule TAKE 1 CAPSULE AT BEDTIME 05/13/23   Kate Lonni CROME, MD  sodium chloride  (OCEAN) 0.65 % SOLN nasal spray Place 1 spray into both nostrils as needed for congestion.    [provider]  triamcinolone cream (KENALOG) 0.1 % Apply topically. 12/04/23   [provider]  valsartan  (DIOVAN ) 160 MG tablet Take 1 tablet (160 mg total) by mouth daily. 12/01/23   Waddell Danelle ORN, MD  warfarin (COUMADIN ) 4 MG tablet TAKE 1 TABLET BY MOUTH ONCE DAILY OR AS DIRECTED BY ANTICOAGULATION CLINIC 11/04/23   Kate Lonni CROME, MD                                                                                 Vitals:   12/18/23 1725 12/18/23 1809 12/18/23 1811 12/18/23 1940  BP: 120/65 127/83  123/73  Pulse: 70 70  72  Resp: 19   18  Temp:  97.9 F (36.6 C)  (!) 97.5 F (36.4 C)  TempSrc:  Oral  Oral  SpO2: 100% 100%  99%  Weight:  64.6 kg 64.6 kg   Height:  5' 2 (1.575 m) 5' 2 (1.575 m)    Physical Exam Vitals reviewed.  Constitutional:      General: She is not in acute distress.    Appearance: She is not ill-appearing.  HENT:     Head: Normocephalic and atraumatic.  Eyes:     Extraocular Movements: Extraocular movements intact.  Cardiovascular:     Rate and Rhythm: Normal rate and regular rhythm.     Pulses: Normal pulses.     Heart sounds: Normal heart sounds.  Pulmonary:     Effort: Pulmonary effort is normal.     Breath sounds: Rales present.  Abdominal:     General: There is no distension.     Palpations: Abdomen is soft.     Tenderness: There is no abdominal tenderness.  Musculoskeletal:     Right lower leg: Edema present.     Left lower leg: Edema present.  Neurological:     General: No focal deficit present.     Mental Status: She is alert and oriented to person, place, and time.  Psychiatric:        Mood and Affect: Mood normal.        Behavior: Behavior normal.     Labs on Admission: I have personally reviewed following labs and imaging studies CBC: Recent Labs  Lab 12/18/23 1451  WBC 6.5  NEUTROABS 4.9  HGB 11.0*  HCT 32.9*  MCV 85.7  PLT 269   Basic Metabolic Panel: Recent Labs  Lab 12/18/23 1451  NA 125*  K 3.4*  CL 88*  CO2 25  GLUCOSE 160*  BUN 12  CREATININE 1.04*  CALCIUM  8.9   GFR: Estimated Creatinine Clearance: 34.3 mL/min (A) (by C-G formula based on SCr of 1.04 mg/dL (H)). Liver Function  Tests: Recent Labs  Lab 12/18/23 1451  AST 47*  ALT 45*  ALKPHOS 92  BILITOT 0.5  PROT 6.6  ALBUMIN 3.7   No results for input(s): LIPASE, AMYLASE in the last 168 hours. No results for input(s): AMMONIA in the last 168 hours. Recent Labs    12/11/23 1349 12/18/23 1451  BUN 14 12  CREATININE 0.92 1.04*    Cardiac Enzymes: No results for input(s): CKTOTAL, CKMB, CKMBINDEX, TROPONINI in the last 168 hours. BNP (last 3 results) Recent Labs    12/11/23 1349 12/18/23 1451  PROBNP 1,372.0* 2,070.0*   HbA1C: No results for input(s): HGBA1C in the last 72 hours. CBG: No results for input(s): GLUCAP in the last 168 hours. Lipid Profile: No results for input(s): CHOL, HDL, LDLCALC, TRIG, CHOLHDL, LDLDIRECT in the last 72 hours. Thyroid  Function Tests: No results for input(s): TSH, T4TOTAL, FREET4, T3FREE, THYROIDAB in the last 72 hours. Anemia Panel: No results for input(s): VITAMINB12, FOLATE, FERRITIN, TIBC, IRON, RETICCTPCT in the last 72 hours. Urine analysis:    Component Value Date/Time   COLORURINE YELLOW 03/19/2022 1302   APPEARANCEUR CLEAR 03/19/2022 1302   LABSPEC 1.010 03/19/2022 1302   PHURINE 6.5 03/19/2022 1302   GLUCOSEU NEGATIVE 03/19/2022 1302   HGBUR TRACE (A) 03/19/2022 1302   BILIRUBINUR NEGATIVE 03/19/2022 1302   KETONESUR NEGATIVE 03/19/2022 1302   PROTEINUR NEGATIVE 03/19/2022 1302   NITRITE NEGATIVE 03/19/2022 1302   LEUKOCYTESUR NEGATIVE 03/19/2022 1302  Radiological Exams on Admission: DG Chest Port 1 View Result Date: 12/18/2023 CLINICAL DATA:  Shortness of breath EXAM: PORTABLE CHEST 1 VIEW COMPARISON:  12/11/2023 FINDINGS: Single frontal view of the chest demonstrates stable dual lead pacemaker. Cardiac silhouette is enlarged but stable. No acute airspace disease, effusion, or pneumothorax. No acute bony abnormalities. IMPRESSION: 1. Stable chest, no acute process. Electronically Signed    By: Ozell Daring M.D.   On: 12/18/2023 15:22   Data Reviewed: Relevant notes from primary care and specialist visits, past discharge summaries as available in EHR, including Care Everywhere . Prior diagnostic testing as pertinent to current admission diagnoses, Updated medications and problem lists for reconciliation .ED course, including vitals, labs, imaging, treatment and response to treatment,Triage notes, nursing and pharmacy notes and ED provider's notes.Notable results as noted in HPI.Discussed case with EDMD/ ED APP/ or Specialty MD on call and as needed.  Assessment & Plan  >> Acute on chronic HFpEF/hypertrophic obstructive cardiomyopathy: Patient has a history of preserved ejection fraction with known diastolic dysfunction presenting with shortness of breath and volume overload.  Clinically her symptoms are consistent with same patient has lower extremity edema rales shortness of breath weight gain of 10 pounds.  Patient received Lasix  20 mg at [facility].  Will continue with Lasix  20 mg IV every 12 x 2 doses with strict I's and O's Daily weights fluid restriction.  2D echocardiogram.  Monitor kidney function and electrolytes.  Avoid overdiuresis.  Will continue patient on her metoprolol . Patient serum osmolality is 266 and thus patient is hypervolemic hyponatremia additional labs are pending we will continue with Lasix  every 12 with first dose at 2350 tonight.  >> Mild to moderate chronic hyponatremia: Chronic hyponatremia secondary to diuretic use.  Will obtain a serum osmolality and urine osmolality.corrected is 126. Cont with cautious diuresis with lasix  20 mg iv q8h and serum sodium q4 hourly x 3. Will obtain serum and urine osmolality and additional hyponatremia evaluation labs.   >> A-fib: Currently patient is in SR on diltiazem /Multaq  and Coumadin . I will continue with Coumadin  pharmacy consult.  >>Abnormal LFT's: Mild 2/2 to passive hepatic congestion.   >>AKI: Lab Results   Component Value Date   CREATININE 1.04 (H) 12/18/2023   CREATININE 0.92 12/11/2023   CREATININE 1.11 (H) 12/04/2022    >> Hypothyroidism: Continue levothyroxine  at 125 mcg p.o. daily.    >>Symptomatic bradycardia: S/p Pacemaker.    >>Anemia: Since 2022. Currently hb is stable and we will follow an defer to pcp for further eval.    DVT prophylaxis:  Coumadin .  Consults:  None.   Advance Care Planning:    Code Status: Full Code   Family Communication:  None.  Disposition Plan:  Home.  Severity of Illness: The appropriate patient status for this patient is INPATIENT. Inpatient status is judged to be reasonable and necessary in order to provide the required intensity of service to ensure the patient's safety. The patient's presenting symptoms, physical exam findings, and initial radiographic and laboratory data in the context of their chronic comorbidities is felt to place them at high risk for further clinical deterioration. Furthermore, it is not anticipated that the patient will be medically stable for discharge from the hospital within 2 midnights of admission.   * I certify that at the point of admission it is my clinical judgment that the patient will require inpatient hospital care spanning beyond 2 midnights from the point of admission due to high intensity of service, high risk for further  deterioration and high frequency of surveillance required.*  Unresulted Labs (From admission, onward)     Start     Ordered   12/18/23 1858  Sodium  Now then every 4 hours,   R     Question:  Specimen collection method  Answer:  Lab=Lab collect   12/18/23 1857   12/18/23 1851  Osmolality, urine  Add-on,   AD        12/18/23 1850   12/18/23 1851  Sodium, urine, random  Add-on,   AD        12/18/23 1850   12/18/23 1851  Creatinine, urine, random  Add-on,   AD        12/18/23 1850   12/18/23 1851  Uric acid  Add-on,   AD       Question:  Specimen collection method  Answer:   Lab=Lab collect   12/18/23 1850   12/18/23 1851  TSH  Add-on,   AD       Question:  Specimen collection method  Answer:  Lab=Lab collect   12/18/23 1850   12/18/23 1851  T4, free  Add-on,   AD       Question:  Specimen collection method  Answer:  Lab=Lab collect   12/18/23 1850           Meds ordered this encounter  Medications   furosemide  (LASIX ) injection 20 mg   DISCONTD: furosemide  (LASIX ) injection 20 mg   sodium chloride  flush (NS) 0.9 % injection 3 mL   OR Linked Order Group    acetaminophen  (TYLENOL ) tablet 650 mg    acetaminophen  (TYLENOL ) suppository 650 mg   polyethylene glycol (MIRALAX  / GLYCOLAX ) packet 17 g   bisacodyl  (DULCOLAX) EC tablet 5 mg   OR Linked Order Group    ondansetron  (ZOFRAN ) tablet 4 mg    ondansetron  (ZOFRAN ) injection 4 mg   furosemide  (LASIX ) injection 20 mg   dronedarone  (MULTAQ ) tablet 400 mg   diltiazem  (CARDIZEM  CD) 24 hr capsule 300 mg   levothyroxine  (SYNTHROID ) tablet 125 mcg   metoprolol  succinate (TOPROL -XL) 24 hr tablet 25 mg   prazosin  (MINIPRESS ) capsule 1 mg     Orders Placed This Encounter  Procedures   Resp panel by RT-PCR (RSV, Flu A&B, Covid) Anterior Nasal Swab   DG Chest Port 1 View   Basic metabolic panel   CBC with Differential   Brain natriuretic peptide   Hepatic function panel   Protime-INR   Osmolality   Osmolality, urine   Sodium, urine, random   Creatinine, urine, random   Uric acid   TSH   T4, free   Sodium   Diet Heart Room service appropriate? Yes; Fluid consistency: Nectar Thick   ED Cardiac monitoring   Cardiac Monitoring Continuous x 48 hours Indications for use: Other; Other indications for use: chf   Strict intake and output   Daily weights   Notify physician (specify)   Initiate Heart Failure Care Plan   Daily weights   Strict intake and output   In and Out Cath   Patient Education:   Apply Heart Failure Care Plan   [facility] and [facility] only) Obtain REDS clips reading Every morning    Patient has an active order for admit to inpatient/place in observation   Maintain IV access   Vital signs   Notify physician (specify)   Mobility Protocol: No Restrictions   Refer to Sidebar Report Mobility Protocol for Adult Inpatient   Initiate Adult Central Line Maintenance  and Catheter Clearance Protocol for patients with central line (CVC, PICC, Port, Hemodialysis, Trialysis)   If patient diabetic or glucose greater than 140 notify physician for Sliding Scale Insulin Orders   Initiate CHG Protocol for patients in ICU/SD or any patient with a central line or foley catheter   Do not place and if present remove PureWick   Initiate Oral Care Protocol   Initiate Carrier Fluid Protocol   RN may order General Admission PRN Orders utilizing General Admission PRN medications (through manage orders) for the following patient needs: allergy symptoms (Claritin), cold sores (Carmex), cough (Robitussin DM), eye irritation (Liquifilm Tears), hemorrhoids (Tucks), indigestion (Maalox), minor skin irritation (Hydrocortisone Cream), muscle pain Lucienne Gay), nose irritation (saline nasal spray) and sore throat (Chloraseptic spray).   Full code   Consult to hospitalist   Consult to Transition of Care Team   Consult to Heart Failure Navigation Team [facility], [facility], and [facility])   Nutritional services consult   warfarin (COUMADIN ) per pharmacy consult   OT eval and treat   PT eval and treat   ED Pulse oximetry, continuous   Pulse oximetry check with vital signs   Oxygen therapy Mode or (Route): Nasal cannula; Liters Per Minute: 2; Keep O2 saturation between: greater than 92 %   Incentive spirometry   ED EKG   EKG   EKG   ECHOCARDIOGRAM COMPLETE   Insert peripheral IV   Saline lock IV   Insert peripheral IV   Admit to Inpatient (patient's expected length of stay will be greater than 2 midnights or inpatient only procedure)   Author: Mario LULLA Blanch, MD 12 pm- 8 pm. [facility]. 12/18/2023 7:52 PM Please  note for any communication after hours contact [facility] Assigned provider on call on Amion.

## 2023-12-19 ENCOUNTER — Inpatient Hospital Stay (HOSPITAL_COMMUNITY)

## 2023-12-19 DIAGNOSIS — I5031 Acute diastolic (congestive) heart failure: Secondary | ICD-10-CM | POA: Diagnosis not present

## 2023-12-19 LAB — ECHOCARDIOGRAM COMPLETE
AR max vel: 1.66 cm2
AV Area VTI: 1.3 cm2
AV Area mean vel: 1.69 cm2
AV Mean grad: 5 mmHg
AV Peak grad: 8.8 mmHg
Ao pk vel: 1.48 m/s
Area-P 1/2: 3.11 cm2
Height: 62 in
S' Lateral: 3 cm
Weight: 2236.35 [oz_av]

## 2023-12-19 LAB — CBC
HCT: 31.7 % — ABNORMAL LOW (ref 36.0–46.0)
Hemoglobin: 10.8 g/dL — ABNORMAL LOW (ref 12.0–15.0)
MCH: 28.6 pg (ref 26.0–34.0)
MCHC: 34.1 g/dL (ref 30.0–36.0)
MCV: 83.9 fL (ref 80.0–100.0)
Platelets: 259 K/uL (ref 150–400)
RBC: 3.78 MIL/uL — ABNORMAL LOW (ref 3.87–5.11)
RDW: 13.2 % (ref 11.5–15.5)
WBC: 6 K/uL (ref 4.0–10.5)
nRBC: 0 % (ref 0.0–0.2)

## 2023-12-19 LAB — CREATININE, URINE, RANDOM: Creatinine, Urine: 17 mg/dL

## 2023-12-19 LAB — PROTIME-INR
INR: 3.1 — ABNORMAL HIGH (ref 0.8–1.2)
Prothrombin Time: 33.7 s — ABNORMAL HIGH (ref 11.4–15.2)

## 2023-12-19 LAB — OSMOLALITY, URINE: Osmolality, Ur: 202 mosm/kg — ABNORMAL LOW (ref 300–900)

## 2023-12-19 LAB — SODIUM: Sodium: 131 mmol/L — ABNORMAL LOW (ref 135–145)

## 2023-12-19 LAB — SODIUM, URINE, RANDOM: Sodium, Ur: 67 mmol/L

## 2023-12-19 MED ORDER — WARFARIN SODIUM 2 MG PO TABS
2.0000 mg | ORAL_TABLET | Freq: Once | ORAL | Status: AC
  Administered 2023-12-19: 2 mg via ORAL
  Filled 2023-12-19: qty 1

## 2023-12-19 NOTE — Plan of Care (Signed)
  Problem: Education: Goal: Knowledge of General Education information will improve Description: Including pain rating scale, medication(s)/side effects and non-pharmacologic comfort measures Outcome: Progressing   Problem: Clinical Measurements: Goal: Ability to maintain clinical measurements within normal limits will improve Outcome: Progressing   Problem: Clinical Measurements: Goal: Will remain free from infection Outcome: Progressing   Problem: Clinical Measurements: Goal: Diagnostic test results will improve Outcome: Progressing   Problem: Clinical Measurements: Goal: Respiratory complications will improve Outcome: Progressing   Problem: Clinical Measurements: Goal: Cardiovascular complication will be avoided Outcome: Progressing   Problem: Activity: Goal: Risk for activity intolerance will decrease Outcome: Progressing   Problem: Skin Integrity: Goal: Risk for impaired skin integrity will decrease Outcome: Progressing   Problem: Safety: Goal: Ability to remain free from injury will improve Outcome: Progressing   Problem: Cardiac: Goal: Ability to achieve and maintain adequate cardiopulmonary perfusion will improve Outcome: Progressing

## 2023-12-19 NOTE — Evaluation (Signed)
 Occupational Therapy Evaluation and Discharge Patient Details Name: Paula [name] MRN: [id number] DOB: Mar 30, 1935 Today's Date: 12/19/2023   History of Present Illness   Pt is an 88 y.o. F presenting to [facility] on 12/18/23 with CHF exacerbation. PMH is significant for HTN, A-fib, hypothyroidism, and SBO.     Clinical Impressions Pt is functioning modified independently in mobility and ADLs. Educated in energy conservation strategies, fall prevention and encouraged continued active lifestyle. No further OT needs.      If plan is discharge home, recommend the following:   Assist for transportation     Functional Status Assessment   Patient has not had a recent decline in their functional status     Equipment Recommendations   None recommended by OT     Recommendations for Other Services         Precautions/Restrictions   Precautions Precautions: None Recall of Precautions/Restrictions: Intact Restrictions Weight Bearing Restrictions Per Provider Order: No     Mobility Bed Mobility               General bed mobility comments: received in chair, able to put foot of recliner down independently    Transfers Overall transfer level: Modified independent                 General transfer comment: uses arm of recliner to stand      Balance                                           ADL either performed or assessed with clinical judgement   ADL Overall ADL's : Modified independent                                       General ADL Comments: Educated in energy conservation strategies. Pt is aware of her sodium restrictions and weighs daily.     Vision Ability to See in Adequate Light: 0 Adequate Patient Visual Report: No change from baseline       Perception         Praxis         Pertinent Vitals/Pain Pain Assessment Pain Assessment: No/denies pain     Extremity/Trunk Assessment Upper  Extremity Assessment Upper Extremity Assessment: Overall WFL for tasks assessed   Lower Extremity Assessment Lower Extremity Assessment: Defer to PT evaluation   Cervical / Trunk Assessment Cervical / Trunk Assessment: Kyphotic   Communication Communication Communication: No apparent difficulties   Cognition Arousal: Alert Behavior During Therapy: WFL for tasks assessed/performed Cognition: No apparent impairments                               Following commands: Intact       Cueing  General Comments   Cueing Techniques: Verbal cues  no signs of acute distress   Exercises     Shoulder Instructions      Home Living Family/patient expects to be discharged to:: Private residence Living Arrangements: Spouse/significant other Available Help at Discharge: Available 24 hours/day Type of Home: House Home Access: Level entry     Home Layout: Two level;Able to live on main level with bedroom/bathroom     Bathroom Shower/Tub: Producer, Television/film/video: Handicapped height Bathroom Accessibility:  Yes   Home Equipment: Grab bars - toilet;Grab bars - tub/shower          Prior Functioning/Environment Prior Level of Function : Independent/Modified Independent;Other (comment) (hasn't been driving lately)             Mobility Comments: independent. does chair exercise class 1x/week, and just finished a balance class. ADLs Comments: independent, ordered thanksgiving meal as she was not feeling well    OT Problem List:     OT Treatment/Interventions:        OT Goals(Current goals can be found in the care plan section)       OT Frequency:       Co-evaluation              AM-PAC OT 6 Clicks Daily Activity     Outcome Measure Help from another person eating meals?: None Help from another person taking care of personal grooming?: None Help from another person toileting, which includes using toliet, bedpan, or urinal?: None Help  from another person bathing (including washing, rinsing, drying)?: None Help from another person to put on and taking off regular upper body clothing?: None Help from another person to put on and taking off regular lower body clothing?: None 6 Click Score: 24   End of Session Nurse Communication: Mobility status  Activity Tolerance: Patient tolerated treatment well Patient left: in chair;with call bell/phone within reach  OT Visit Diagnosis: Other (comment) (decreased activity tolerance)                Time: 9079-9066 OT Time Calculation (min): 13 min Charges:  OT General Charges $OT Visit: 1 Visit OT Evaluation $OT Eval Low Complexity: 1 Low  Mliss HERO, OTR/L Acute Rehabilitation Services Office: [phone]   Kennth Mliss Helling 12/19/2023, 10:24 AM

## 2023-12-19 NOTE — Progress Notes (Signed)
 Initial Nutrition Assessment  DOCUMENTATION CODES:   Not applicable  INTERVENTION:  Continue Heart Healthy diet as tolerated Change from nectar thick to thin liquids if appropriate Encourage PO intake    NUTRITION DIAGNOSIS:    (Decreased nutrient needs) related to chronic illness as evidenced by  (presents for CHF exacerbation).    GOAL:   Patient will meet greater than or equal to 90% of their needs    MONITOR:   PO intake, Labs, Weight trends  REASON FOR ASSESSMENT:   Consult Assessment of nutrition requirement/status  ASSESSMENT:   Past medical history  of  apical hypertrophic cardiomyopathy, history of small bowel obstruction, paroxysmal atrial fibrillation (on coumadin ), hypertension, hypothyroidism and CHF being admitted today for CHF exacerbatio  Patient seen in room sleeping, easily arousable. Pt reports ~ 10 lb weight gain from fluid retention, normally weighs between 120-130 lbs. No changes to PO intake PTA reported. Patient ate 100% of both breakfast and lunch today. Pt reports that she takes miralax  daily at home to manage constipation, ordered daily prn. Pt currently has nectar thick liquids ordered for diet. Pt denies any swallowing difficulties with solids or liquids, no history of speech therapy in chart history.   Admit/current weight: 63.4 kg   Nutritionally Relevant Medications: Warfarin  Labs Reviewed: Na 131, K 3.4, Cl 88, Glu 160, Cr 1.0, GFR 52  NUTRITION - FOCUSED PHYSICAL EXAM:  Flowsheet Row Most Recent Value  Orbital Region No depletion  Upper Arm Region No depletion  Thoracic and Lumbar Region No depletion  Buccal Region No depletion  Temple Region No depletion  Clavicle Bone Region No depletion  Clavicle and Acromion Bone Region No depletion  Scapular Bone Region No depletion  Dorsal Hand No depletion  Patellar Region Unable to assess  [edema]  Anterior Thigh Region Unable to assess  [edema]  Posterior Calf Region Unable to  assess  [edema]  Edema (RD Assessment) Mild  [BLE]  Hair Reviewed  Eyes Reviewed  Mouth Reviewed  Skin Reviewed  Nails Reviewed    Diet Order:   Diet Order             Diet Heart Room service appropriate? Yes; Fluid consistency: Nectar Thick  Diet effective now                   EDUCATION NEEDS:   No education needs have been identified at this time  Skin:  Skin Assessment: Reviewed RN Assessment  Last BM:  11/27  Height:   Ht Readings from Last 1 Encounters:  12/18/23 5' 2 (1.575 m)    Weight:   Wt Readings from Last 1 Encounters:  12/19/23 63.4 kg    Ideal Body Weight:  50 kg  BMI:  Body mass index is 25.56 kg/m.  Estimated Nutritional Needs:   Kcal:  1585-1900  Protein:  63-76 g  Fluid:  1.8-1.9L/d  Madalyn Potters, MS, RD, LDN Clinical Dietitian  Contact via secure chat. If unavailable, use group chat RD Inpatient.

## 2023-12-19 NOTE — Plan of Care (Signed)
   Problem: Education: Goal: Knowledge of General Education information will improve Description: Including pain rating scale, medication(s)/side effects and non-pharmacologic comfort measures Outcome: Progressing   Problem: Clinical Measurements: Goal: Diagnostic test results will improve Outcome: Progressing

## 2023-12-19 NOTE — Progress Notes (Signed)
  ANTICOAGULATION CONSULT NOTE  Pharmacy Consult for warfarin Indication: Afib, apical thrombus 03/2022  Allergies  Allergen Reactions   Eliquis  [Apixaban ] Other (See Comments)    Doesn't Work for her. Developed a clot while taking it.   Wound Dressing Adhesive Rash    Patient Measurements: Height: 5' 2 (157.5 cm) Weight: 63.4 kg (139 lb 12.4 oz) IBW/kg (Calculated) : 50.1  Vital Signs: Temp: 98.7 F (37.1 C) (11/28 0719) Temp Source: Oral (11/28 0719) BP: 100/69 (11/28 0919) Pulse Rate: 75 (11/28 0719)  Labs: Recent Labs    12/18/23 1451 12/19/23 0259  HGB 11.0* 10.8*  HCT 32.9* 31.7*  PLT 269 259  LABPROT 32.7* 33.7*  INR 3.0* 3.1*  CREATININE 1.04*  --     Estimated Creatinine Clearance: 34 mL/min (A) (by C-G formula based on SCr of 1.04 mg/dL (H)).  Medical History: Past Medical History:  Diagnosis Date   Anemia    Atrial fibrillation (HCC)    CHF (congestive heart failure) (HCC)    DVT (deep venous thrombosis) (HCC)    in her heart   Hyperlipidemia    Hypertension    Hypothyroidism    Irregular heart rate     Medications:  See MAR  Assessment: 69 yoM admitted with CHF exacerbation.  Pharmacy consulted for warfarin dosing.  PTA warfarin regimen - 4 mg daily per 11/10 office visit (4 mg tablets) Notable DDI's - dronedarone  (PTA)  INR 3.1 today on upper end of therapeutic range (in setting of volume overload). Last warfarin dose 11/27.  Anticipate will be become less sensitive to warfarin with diuresis. Dose held 11/27.  Goal of Therapy:  INR 2-3 Monitor platelets by anticoagulation protocol: Yes   Plan:  Give warfarin 2 mg PO x 1 Monitor daily INR, CBC, clinical course, s/sx of bleed, PO intake/diet, Drug-Drug Interactions   Thank you for allowing pharmacy to be a part of this patient's care.   Bascom JAYSON Louder, PharmD 12/19/2023 9:59 AM  **Pharmacist phone directory can be found on [email or web address] listed under [facility]**

## 2023-12-19 NOTE — Evaluation (Addendum)
 Physical Therapy Evaluation and DC Patient Details Name: Paula [name] MRN: [id number] DOB: [birth year] Today's Date: 12/19/2023  History of Present Illness  Pt is an 88 y.o. F presenting to [facility] on 12/18/23 with CHF exacerbation. PMH is significant for HTN, A-fib, hypothyroidism, and SBO.  Clinical Impression  Prior to admittance pt was mobilizing independently and was independent with ADLs. Pt presents to evaluation near baseline level of function. Pt was supervision-mod I for all mobility tasks with HR remaining between 82-84 bpm. Pt was given HF handout and educated on its contents with patient asking appropriate follow-up questions and verbalized understanding. Pt does not need skilled PT in the acute setting or follow-up thearpies; signing off at this time.          If plan is discharge home, recommend the following: Assist for transportation   Can travel by private vehicle        Equipment Recommendations None recommended by PT  Recommendations for Other Services       Functional Status Assessment Patient has not had a recent decline in their functional status     Precautions / Restrictions Precautions Precautions: None Recall of Precautions/Restrictions: Intact Restrictions Weight Bearing Restrictions Per Provider Order: No      Mobility  Bed Mobility               General bed mobility comments: pt received ambulating back to bed from bathroom and left in recliner at end of session    Transfers Overall transfer level: Modified independent Equipment used: None               General transfer comment: pt pushes up from bed w/ BUE    Ambulation/Gait Ambulation/Gait assistance: Supervision Gait Distance (Feet): 125 Feet Assistive device: None Gait Pattern/deviations: Step-through pattern, Decreased stride length, Narrow base of support (pt's right leg is in slighter ER throughout gait) Gait velocity: functional Gait velocity interpretation:  <1.8 ft/sec, indicate of risk for recurrent falls Pre-gait activities: standing marches at [employer] Gait Details: Pt ambulates w/out an AD and no physical assitance. Pt does demonstrate slight ER of RLE throughout gait and several instances of narrowed BOS but is able to self correct w/out loss of balance. Pt able to multi-task throughout gait w/ gait speed remaining consitent throughout.  Stairs            Wheelchair Mobility     Tilt Bed    Modified Rankin (Stroke Patients Only)       Balance Overall balance assessment: Modified Independent                                           Pertinent Vitals/Pain Pain Assessment Pain Assessment: No/denies pain    Home Living Family/patient expects to be discharged to:: Private residence Living Arrangements: Spouse/significant other Available Help at Discharge: Available 24 hours/day Type of Home: House Home Access: Level entry       Home Layout: Two level;Able to live on main level with bedroom/bathroom (washer/dryer in basement, but spouse goes down there and brings laundry up for pt to fold) Home Equipment: Grab bars - toilet;Grab bars - tub/shower      Prior Function Prior Level of Function : Independent/Modified Independent;History of Falls (last six months) (1 fainting episode resulting in a fall)             Mobility Comments: independent.  does chair exercise class 1x/week, and just finished a balance class. ADLs Comments: independent     Extremity/Trunk Assessment   Upper Extremity Assessment Upper Extremity Assessment: Defer to OT evaluation    Lower Extremity Assessment Lower Extremity Assessment: Overall WFL for tasks assessed    Cervical / Trunk Assessment Cervical / Trunk Assessment: Kyphotic  Communication   Communication Communication: No apparent difficulties    Cognition Arousal: Alert Behavior During Therapy: WFL for tasks assessed/performed   PT - Cognitive  impairments: No apparent impairments                         Following commands: Intact       Cueing Cueing Techniques: Verbal cues, Visual cues     General Comments General comments (skin integrity, edema, etc.): no signs of acute distress    Exercises     Assessment/Plan    PT Assessment Patient does not need any further PT services  PT Problem List         PT Treatment Interventions      PT Goals (Current goals can be found in the Care Plan section)  Acute Rehab PT Goals Patient Stated Goal: to keep getting stronger PT Goal Formulation: With patient    Frequency       Co-evaluation               AM-PAC PT 6 Clicks Mobility  Outcome Measure Help needed turning from your back to your side while in a flat bed without using bedrails?: None Help needed moving from lying on your back to sitting on the side of a flat bed without using bedrails?: None Help needed moving to and from a bed to a chair (including a wheelchair)?: None Help needed standing up from a chair using your arms (e.g., wheelchair or bedside chair)?: None Help needed to walk in hospital room?: None Help needed climbing 3-5 steps with a railing? : Total 6 Click Score: 21    End of Session Equipment Utilized During Treatment: Gait belt Activity Tolerance: Patient tolerated treatment well Patient left: in chair;with call bell/phone within reach;with nursing/sitter in room Nurse Communication: Mobility status PT Visit Diagnosis: History of falling (Z91.81)    Time: 9154-9084 PT Time Calculation (min) (ACUTE ONLY): 30 min   Charges:   PT Evaluation $PT Eval Low Complexity: 1 Low PT Treatments $Therapeutic Activity: 8-22 mins PT General Charges $$ ACUTE PT VISIT: 1 Visit         Leontine Hilt DPT Acute Rehab Services [phone] Prefer contact via chat  Leontine NOVAK Rahima Fleishman 12/19/2023, 9:36 AM

## 2023-12-19 NOTE — Hospital Course (Signed)
 HPI:  Paula [name] is a 88 y.o. female with past medical history  of  apical hypertrophic cardiomyopathy, history of small bowel obstruction, paroxysmal atrial fibrillation (on coumadin ), hypertension, hypothyroidism and CHF being admitted today for CHF exacerbation.  Next chart review shows telephone note Above patient having complaints of shortness of breath fatigue abdominal distention weight gain.  Reports about 10 pound weight gain in about the past 2 week also abdominal swelling >lower extremity edema bilaterally and now notices shortness of breath with ambulating.  [facility] and [facility] to [facility] for ongoing care, patient's most recent echo was in March 2025 she does have an upcoming echocardiogram scheduled.  March echocardiogram shows EF of 60-65, severe LVH, indeterminate diastolic parameters.  Elevated pulmonary artery pressures, dilated left atrium, moderate MR, severe TR.  Reviewed pacemaker check on 10 November which showed episodes of a flutter and A-fib with uncontrolled rates. No reports of chest pain headaches nausea vomiting dizziness or loss of consciousness or falls. Patient was also seen on 20 November at [facility] for shortness of breath, EDMD documents that patient's amiodarone  was switched to diltiazem  due to elevated LFTs.     ED Course:  Vital signs in the ED were notable for the following:  Multiple Vitals        Vitals:    12/18/23 1725 12/18/23 1809 12/18/23 1811 12/18/23 1940  BP: 120/65 127/83   123/73  Pulse: 70 70   72  Temp:   97.9 F (36.6 C)   (!) 97.5 F (36.4 C)  Resp: 19     18  Height:   5' 2 (1.575 m) 5' 2 (1.575 m)    Weight:   64.6 kg 64.6 kg    SpO2: 100% 100%   99%  TempSrc:   Oral   Oral  BMI (Calculated):   26.04 26.04        >>ED evaluation thus far shows: Initial CMP shows sodium 125 which is chronic on trend review, potassium 3.4 chloride 88 glucose 168 creatinine 1.04 eGFR 52 mild AST ALT  elevation at 47 and 45 respectively. proBNP at 2070, initial troponin of 17. Initial CBC with a normal white count of 6.5 hemoglobin of 11 but seems to be chronic since 2022 as far as our labs and platelets of 269. INR of 3.0. Initial chest x-ray shows stable chest with no acute process.

## 2023-12-19 NOTE — Progress Notes (Signed)
 Physical Therapy Discharge Patient Details Name: Paula [name] MRN: [id number] DOB: 1935-12-30 Today's Date: 12/19/2023 Time: 9154-9084 PT Time Calculation (min) (ACUTE ONLY): 30 min  Patient discharged from PT services secondary to goals met and no further PT needs identified.  Please see latest therapy progress note for current level of functioning and progress toward goals.    Progress and discharge plan discussed with patient and/or caregiver: Patient/Caregiver agrees with plan  GP    Leontine Hilt DPT Acute Rehab Services [phone] Prefer contact via chat   Leontine KATHEE Hilt 12/19/2023, 9:55 AM

## 2023-12-19 NOTE — Progress Notes (Signed)
 Progress Note   Patient: Paula [name] FMW:[id number] DOB: 1935-07-14 DOA: 12/18/2023     1 DOS: the patient was seen and examined on 12/19/2023   Brief hospital course: HPI:  [name] is a 88 y.o. female with past medical history  of  apical hypertrophic cardiomyopathy, history of small bowel obstruction, paroxysmal atrial fibrillation (on coumadin ), hypertension, hypothyroidism and CHF being admitted today for CHF exacerbation.  Next chart review shows telephone note Above patient having complaints of shortness of breath fatigue abdominal distention weight gain.  Reports about 10 pound weight gain in about the past 2 week also abdominal swelling >lower extremity edema bilaterally and now notices shortness of breath with ambulating.  [facility] and [facility] for ongoing care, patient's most recent echo was in March 2025 she does have an upcoming echocardiogram scheduled.  March echocardiogram shows EF of 60-65, severe LVH, indeterminate diastolic parameters.  Elevated pulmonary artery pressures, dilated left atrium, moderate MR, severe TR.  Reviewed pacemaker check on 10 November which showed episodes of a flutter and A-fib with uncontrolled rates. No reports of chest pain headaches nausea vomiting dizziness or loss of consciousness or falls. Patient was also seen on 20 November at [facility] for shortness of breath, EDMD documents that patient's amiodarone  was switched to diltiazem  due to elevated LFTs.     ED Course:  Vital signs in the ED were notable for the following:  Multiple Vitals        Vitals:    12/18/23 1725 12/18/23 1809 12/18/23 1811 12/18/23 1940  BP: 120/65 127/83   123/73  Pulse: 70 70   72  Temp:   97.9 F (36.6 C)   (!) 97.5 F (36.4 C)  Resp: 19     18  Height:   5' 2 (1.575 m) 5' 2 (1.575 m)    Weight:   64.6 kg 64.6 kg    SpO2: 100% 100%   99%  TempSrc:   Oral   Oral  BMI (Calculated):   26.04 26.04         >>ED evaluation thus far shows: Initial CMP shows sodium 125 which is chronic on trend review, potassium 3.4 chloride 88 glucose 168 creatinine 1.04 eGFR 52 mild AST ALT elevation at 47 and 45 respectively. proBNP at 2070, initial troponin of 17. Initial CBC with a normal white count of 6.5 hemoglobin of 11 but seems to be chronic since 2022 as far as our labs and platelets of 269. INR of 3.0. Initial chest x-ray shows stable chest with no acute process.  Assessment and Plan: 88 year old female/PMH of HFpEF, HTN, paroxysmal atrial fibrillation on Coumadin , hypothyroidism, history of small bowel obstruction who was admitted for acute CHF exacerbation.  Patient is feeling much better but is still has some rales on her exam on the lungs.  1.  Acute exacerbation of HFpEF/hypertrophic obstructive cardiomyopathy - Patient ejection fraction is preserved - Patient takes Lasix  as needed at home. - Patient complained of 10 pound weight gain within a week. - Patient was started on Lasix  20 mg twice daily which has been helping her. - Her lungs are still wet and will benefit from 1 more day of IV diuresis. - She will be continued on IV Lasix  20 mg twice daily, diltiazem , Multaq , metoprolol , prazosin . - Input output charting, daily weights - Supplemental oxygen to maintain saturation more than 90%  2.  HTN - Continue Cardizem , Lasix , metoprolol , prazosin . - Continue to monitor blood  pressure  3.  Chronic atrial fibrillation - Continue diltiazem  and Multaq  and metoprolol . - Continue warfarin - Pharmacy to dose warfarin with goal INR is 2-3 - INR is 3.1 today  4.  Hypothyroidism - Resume home dose of levothyroxine  125 mcg daily       Subjective: Feels better but he still complains some shortness of breath  Physical Exam: Vitals:   12/19/23 0403 12/19/23 0719 12/19/23 0919 12/19/23 1158  BP: 120/70 97/78 100/69 113/79  Pulse: 72 75    Resp: 18 18  18   Temp: (!) 97.4 F (36.3 C)  98.7 F (37.1 C)  98.2 F (36.8 C)  TempSrc: Oral Oral  Oral  SpO2: 98% 95%    Weight: 63.4 kg     Height:       Seen and examined at bedside General alert awake and oriented HEENT: No jugular venous distention Cardiovascular: A-fib Pulmonary: Bilateral basal fine rales, no wheezes no rhonchi GI: Abdomen soft bowel sounds present Extremities: 1+ pitting edema Skin: No rashes Neurological: Alert awake and oriented no focal logical deficit identified  Data Reviewed:  Reviewed  Family Communication: None  Disposition: Status is: Inpatient Remains inpatient appropriate because: Ongoing recovery from congestive heart failure exacerbation  Planned Discharge Destination: Home    Time spent: 35 minutes  Author: Nena Rebel, MD 12/19/2023 2:16 PM  For on call review [email or web address].

## 2023-12-20 DIAGNOSIS — I5031 Acute diastolic (congestive) heart failure: Secondary | ICD-10-CM | POA: Diagnosis not present

## 2023-12-20 LAB — BASIC METABOLIC PANEL WITH GFR
Anion gap: 12 (ref 5–15)
Anion gap: 10 (ref 5–15)
BUN: 11 mg/dL (ref 8–23)
BUN: 14 mg/dL (ref 8–23)
CO2: 25 mmol/L (ref 22–32)
CO2: 23 mmol/L (ref 22–32)
Calcium: 7.9 mg/dL — ABNORMAL LOW (ref 8.9–10.3)
Calcium: 8.1 mg/dL — ABNORMAL LOW (ref 8.9–10.3)
Chloride: 91 mmol/L — ABNORMAL LOW (ref 98–111)
Chloride: 96 mmol/L — ABNORMAL LOW (ref 98–111)
Creatinine, Ser: 1.01 mg/dL — ABNORMAL HIGH (ref 0.44–1.00)
Creatinine, Ser: 0.98 mg/dL (ref 0.44–1.00)
GFR, Estimated: 54 mL/min — ABNORMAL LOW (ref >60)
GFR, Estimated: 56 mL/min — ABNORMAL LOW (ref >60)
Glucose, Bld: 107 mg/dL — ABNORMAL HIGH (ref 70–99)
Glucose, Bld: 98 mg/dL (ref 70–99)
Potassium: 2.9 mmol/L — ABNORMAL LOW (ref 3.5–5.1)
Potassium: 4.5 mmol/L (ref 3.5–5.1)
Sodium: 128 mmol/L — ABNORMAL LOW (ref 135–145)
Sodium: 129 mmol/L — ABNORMAL LOW (ref 135–145)

## 2023-12-20 LAB — CBC
HCT: 31.9 % — ABNORMAL LOW (ref 36.0–46.0)
Hemoglobin: 10.8 g/dL — ABNORMAL LOW (ref 12.0–15.0)
MCH: 29 pg (ref 26.0–34.0)
MCHC: 33.9 g/dL (ref 30.0–36.0)
MCV: 85.8 fL (ref 80.0–100.0)
Platelets: 260 K/uL (ref 150–400)
RBC: 3.72 MIL/uL — ABNORMAL LOW (ref 3.87–5.11)
RDW: 13.3 % (ref 11.5–15.5)
WBC: 6.4 K/uL (ref 4.0–10.5)
nRBC: 0 % (ref 0.0–0.2)

## 2023-12-20 LAB — PROTIME-INR
INR: 2.3 — ABNORMAL HIGH (ref 0.8–1.2)
Prothrombin Time: 26 s — ABNORMAL HIGH (ref 11.4–15.2)

## 2023-12-20 MED ORDER — FUROSEMIDE 20 MG PO TABS: 20.0000 mg | ORAL_TABLET | Freq: Every day | ORAL | 0 refills | Status: DC

## 2023-12-20 MED ORDER — POTASSIUM CHLORIDE 10 MEQ/100ML IV SOLN
10.0000 meq | INTRAVENOUS | Status: AC
  Administered 2023-12-20 (×2): 10 meq via INTRAVENOUS
  Filled 2023-12-20 (×2): qty 100

## 2023-12-20 MED ORDER — POTASSIUM CHLORIDE 20 MEQ PO PACK
40.0000 meq | PACK | Freq: Once | ORAL | Status: AC
  Administered 2023-12-20: 40 meq via ORAL
  Filled 2023-12-20: qty 2

## 2023-12-20 MED ORDER — WARFARIN SODIUM 3 MG PO TABS: 3.0000 mg | ORAL_TABLET | Freq: Once | ORAL | Status: DC

## 2023-12-20 NOTE — Progress Notes (Signed)
  ANTICOAGULATION CONSULT NOTE  Pharmacy Consult for warfarin Indication: Afib, apical thrombus 03/2022  Allergies  Allergen Reactions   Eliquis  [Apixaban ] Other (See Comments)    Doesn't Work for her. Developed a clot while taking it.   Wound Dressing Adhesive Rash    Patient Measurements: Height: 5' 2 (157.5 cm) Weight: 59.4 kg (130 lb 15.3 oz) IBW/kg (Calculated) : 50.1  Vital Signs: Temp: 97.4 F (36.3 C) (11/29 0710) Temp Source: Oral (11/29 0710) BP: 116/67 (11/29 0710) Pulse Rate: 68 (11/29 0710)  Labs: Recent Labs    12/18/23 1451 12/19/23 0259 12/20/23 0159  HGB 11.0* 10.8* 10.8*  HCT 32.9* 31.7* 31.9*  PLT 269 259 260  LABPROT 32.7* 33.7* 26.0*  INR 3.0* 3.1* 2.3*  CREATININE 1.04*  --   --     Estimated Creatinine Clearance: 30.7 mL/min (A) (by C-G formula based on SCr of 1.04 mg/dL (H)).  Medical History: Past Medical History:  Diagnosis Date   Anemia    Atrial fibrillation (HCC)    CHF (congestive heart failure) (HCC)    DVT (deep venous thrombosis) (HCC)    in her heart   Hyperlipidemia    Hypertension    Hypothyroidism    Irregular heart rate     Medications:  See MAR  Assessment: 40 yoF admitted with CHF exacerbation.  Pharmacy consulted for warfarin dosing.  PTA warfarin regimen - 4 mg daily per 11/10 office visit (4 mg tablets) Notable DDI's - dronedarone  (PTA)  INR 2.3 today down from 3.1 yesterday, likely responding to diuresis and has less volume overload. Last warfarin dose 2mg  on 11/28. Anticipate will be become less sensitive to warfarin with diuresis.  Goal of Therapy:  INR 2-3 Monitor platelets by anticoagulation protocol: Yes   Plan:  Give warfarin 3 mg PO x 1 Monitor daily INR, CBC, clinical course, s/sx of bleed, PO intake/diet, Drug-Drug Interactions   Thank you for allowing pharmacy to be involved with this patient's care.  Mendel Barter, PharmD PGY1 Clinical Pharmacist [facility]  12/20/2023  8:52 AM

## 2023-12-20 NOTE — Progress Notes (Signed)
 Mobility Specialist Progress Note:    12/20/23 0930  Mobility  Activity Ambulated with assistance  Level of Assistance Standby assist, set-up cues, supervision of patient - no hands on  Assistive Device None  Distance Ambulated (ft) 250 ft  Activity Response Tolerated well  Mobility Referral Yes  Mobility visit 1 Mobility  Mobility Specialist Start Time (ACUTE ONLY) 0915  Mobility Specialist Stop Time (ACUTE ONLY) [id number]  Mobility Specialist Time Calculation (min) (ACUTE ONLY) 13 min   Received pt in chair having no complaints and agreeable to mobility. Pt was asymptomatic throughout ambulation and returned to room w/o fault. Left in chair w/ call bell in reach and all needs met.   Thersia Minder Mobility Specialist  Please contact vis Secure Chat or  Rehab Office [phone]

## 2023-12-20 NOTE — Discharge Summary (Signed)
 Physician Discharge Summary   Patient: Paula [name] MRN: [id number] DOB: October 07, 1935  Admit date:     12/18/2023  Discharge date: 12/20/23  Discharge Physician: Nena Rebel   PCP: Charlott Dorn LABOR, MD   Recommendations at discharge:   Continue taking Lasix  20 mg daily Follow-up with primary care provider and cardiologist as outpatient Continue foods rich in potassium like banana etc. daily Continue to watch fluid intake not more than 1.5 L a day  Discharge Diagnoses: Principal Problem:   Acute CHF (congestive heart failure) (HCC)  Resolved Problems:   * No resolved hospital problems. *  Hospital Course: HPI:  Paula [name] is a 88 y.o. female with past medical history  of  apical hypertrophic cardiomyopathy, history of small bowel obstruction, paroxysmal atrial fibrillation (on coumadin ), hypertension, hypothyroidism and CHF being admitted today for CHF exacerbation.  Next chart review shows telephone note Above patient having complaints of shortness of breath fatigue abdominal distention weight gain.  Reports about 10 pound weight gain in about the past 2 week also abdominal swelling >lower extremity edema bilaterally and now notices shortness of breath with ambulating.  Patient evaluated at [facility] and transferred to [facility] for ongoing care, patient's most recent echo was in March 2025 she does have an upcoming echocardiogram scheduled.  March echocardiogram shows EF of 60-65, severe LVH, indeterminate diastolic parameters.  Elevated pulmonary artery pressures, dilated left atrium, moderate MR, severe TR.  Reviewed pacemaker check on 10 November which showed episodes of a flutter and A-fib with uncontrolled rates. No reports of chest pain headaches nausea vomiting dizziness or loss of consciousness or falls. Patient was also seen on 20 November at [facility] for shortness of breath, EDMD documents that patient's amiodarone  was switched to diltiazem  due  to elevated LFTs.     ED Course:  Vital signs in the ED were notable for the following:  Multiple Vitals        Vitals:    12/18/23 1725 12/18/23 1809 12/18/23 1811 12/18/23 1940  BP: 120/65 127/83   123/73  Pulse: 70 70   72  Temp:   97.9 F (36.6 C)   (!) 97.5 F (36.4 C)  Resp: 19     18  Height:   5' 2 (1.575 m) 5' 2 (1.575 m)    Weight:   64.6 kg 64.6 kg    SpO2: 100% 100%   99%  TempSrc:   Oral   Oral  BMI (Calculated):   26.04 26.04        >>ED evaluation thus far shows: Initial CMP shows sodium 125 which is chronic on trend review, potassium 3.4 chloride 88 glucose 168 creatinine 1.04 eGFR 52 mild AST ALT elevation at 47 and 45 respectively. proBNP at 2070, initial troponin of 17. Initial CBC with a normal white count of 6.5 hemoglobin of 11 but seems to be chronic since 2022 as far as our labs and platelets of 269. INR of 3.0. Initial chest x-ray shows stable chest with no acute process.  Assessment and Plan: 88 year old female/PMH of HFpEF, HTN, paroxysmal atrial fibrillation on Coumadin , hypothyroidism, history of small bowel obstruction who was admitted for acute CHF exacerbation.  Patient is feeling much better but is still has some coarse crepts. on her exam on the lungs.   1.  Acute exacerbation of HFpEF/hypertrophic obstructive cardiomyopathy - Patient ejection fraction is preserved - Patient takes Lasix  as needed at home. - Patient complained of 10 pound weight gain within a week. -  Patient was started on Lasix  20 mg twice daily which has been helping her. - Her lungs are still has some coarse creps. - She will will be discharged home on 20 mg p.o. Lasix  daily, diltiazem , Multaq , metoprolol , prazosin . - She is off oxygen  2.  Hypokalemia related to diuresis - Extensive counseling was done regarding potassium supplementation - Patient will be given 60 mill equivalents of potassium 40 by mouth and 20 IV. - Will check potassium level today and once  potassium is corrected she will be discharged home. - She has been counseled for  to check potassium rich food and check potassium level as outpatient.   3.  HTN - Continue Cardizem , Lasix , metoprolol , prazosin . - Pressure has been stable   4.  Chronic atrial fibrillation - Continue diltiazem  and Multaq  and metoprolol . - Continue warfarin - Pharmacy to dose warfarin with goal INR is 2-3 - INR is 3.1 today   5.  Hypothyroidism - Resume home dose of levothyroxine  125 mcg daily         Consultants: None Procedures performed: None Disposition: Home Diet recommendation:  Cardiac diet DISCHARGE MEDICATION: Allergies as of 12/20/2023       Reactions   Eliquis  [apixaban ] Other (See Comments)   Doesn't Work for her. Developed a clot while taking it.   Wound Dressing Adhesive Rash        Medication List     TAKE these medications    acetaminophen  325 MG tablet Commonly known as: TYLENOL  Take 650 mg by mouth every 4 (four) hours as needed for moderate pain (pain score 4-6) or headache.   Calcium  Carb-Cholecalciferol 600-5 MG-MCG Tabs Take 1 tablet by mouth daily at 6 (six) AM.   diltiazem  300 MG 24 hr capsule Commonly known as: CARDIZEM  CD TAKE 1 CAPSULE EVERY DAY   dronedarone  400 MG tablet Commonly known as: MULTAQ  Take 1 tablet (400 mg total) by mouth 2 (two) times daily with a meal.   furosemide  20 MG tablet Commonly known as: LASIX  Take 1 tablet (20 mg total) by mouth daily.   levothyroxine  125 MCG tablet Commonly known as: SYNTHROID  Take 125 mcg by mouth daily before breakfast.   Linzess  290 MCG Caps capsule Generic drug: linaclotide  Take 1 capsule (290 mcg total) by mouth daily before breakfast.   metoprolol  succinate 25 MG 24 hr tablet Commonly known as: Toprol  XL Take 1 tablet (25 mg total) by mouth daily. At night after dinner   multivitamin with minerals Tabs tablet Take 1 tablet by mouth daily.   Pepcid  AC 10 MG tablet Generic drug:  famotidine  Take 10 mg by mouth at bedtime.   polyethylene glycol 17 g packet Commonly known as: MIRALAX  / GLYCOLAX  Take 17 g by mouth at bedtime.   prazosin  1 MG capsule Commonly known as: MINIPRESS  TAKE 1 CAPSULE AT BEDTIME   sodium chloride  0.65 % Soln nasal spray Commonly known as: OCEAN Place 1 spray into both nostrils as needed for congestion.   triamcinolone cream 0.1 % Commonly known as: KENALOG Apply 1 Application topically 2 (two) times daily as needed (itching).   valsartan  160 MG tablet Commonly known as: DIOVAN  Take 1 tablet (160 mg total) by mouth daily. What changed: when to take this   warfarin 4 MG tablet Commonly known as: COUMADIN  Take as directed. If you are unsure how to take this medication, talk to your nurse or doctor. Original instructions: TAKE 1 TABLET BY MOUTH ONCE DAILY OR AS DIRECTED BY ANTICOAGULATION CLINIC  What changed: See the new instructions.        Discharge Exam: Filed Weights   12/18/23 1811 12/19/23 0403 12/20/23 0545  Weight: 64.6 kg 63.4 kg 59.4 kg   Seen and examined at bedside. General: Alert awake and oriented not in distress HEENT: Nodule of interest in the Lungs: She has a coarse occasional scattered crepitations on both lung field Heart: Regular S1-S2 GI: Abdomen soft bowel sounds present Extremities: No edema Neurological: Alert awake and oriented, no focal logical deficit identified  Condition at discharge: stable  The results of significant diagnostics from this hospitalization (including imaging, microbiology, ancillary and laboratory) are listed below for reference.   Imaging Studies: ECHOCARDIOGRAM COMPLETE Result Date: 12/19/2023    ECHOCARDIOGRAM REPORT   Patient Name:   Paula [name] Date of Exam: 12/19/2023 Medical Rec #:  [id number]              Height:       62.0 in Accession #:    [id number]             Weight:       139.8 lb Date of Birth:  06-25-1935             BSA:          1.642 m Patient  Age:    88 years               BP:           100/69 mmHg Patient Gender: F                      HR:           71 bpm. Exam Location:  Inpatient Procedure: 2D Echo, Cardiac Doppler, Color Doppler and Strain Analysis (Both            Spectral and Color Flow Doppler were utilized during procedure). Indications:    CHF  History:        Patient has prior history of Echocardiogram examinations, most                 recent 04/03/2023. CHF, Pacemaker; Risk Factors:Hypertension and                 Dyslipidemia.  Sonographer:    Philomena Daring Referring Phys: [id number] EKTA V PATEL IMPRESSIONS  1. Left ventricular ejection fraction, by estimation, is 40 to 45%. The left ventricle has mildly decreased function. The left ventricle demonstrates global hypokinesis. There is severe asymmetric left ventricular hypertrophy of the apical segment. Diastology indeterminate due to atrial fibrillation.  2. Right ventricular systolic function is mildly reduced. The right ventricular size is normal. There is moderately elevated pulmonary artery systolic pressure.  3. Left atrial size was moderately dilated.  4. Right atrial size was severely dilated.  5. There is no evidence of cardiac tamponade.  6. The mitral valve is degenerative. Moderate mitral valve regurgitation. No evidence of mitral stenosis.  7. The tricuspid valve is degenerative. Tricuspid valve regurgitation is severe.  8. The aortic valve is tricuspid. Aortic valve regurgitation is not visualized. No aortic stenosis is present.  9. The inferior vena cava is dilated in size with <50% respiratory variability, suggesting right atrial pressure of 15 mmHg. Comparison(s): Changes from prior study are noted. New mildly reduced LV and RV systolic function. FINDINGS  Left Ventricle: Left ventricular ejection fraction, by estimation, is 40 to 45%. The left ventricle has mildly decreased function. The  left ventricle demonstrates global hypokinesis. The left ventricular internal cavity size  was normal in size. There is  severe asymmetric left ventricular hypertrophy of the apical segment. Abnormal (paradoxical) septal motion, consistent with left bundle branch block. Diastology indeterminate due to atrial fibrillation. Right Ventricle: The right ventricular size is normal. No increase in right ventricular wall thickness. Right ventricular systolic function is mildly reduced. There is moderately elevated pulmonary artery systolic pressure. The tricuspid regurgitant velocity is 3.07 m/s, and with an assumed right atrial pressure of 15 mmHg, the estimated right ventricular systolic pressure is 52.7 mmHg. Left Atrium: Left atrial size was moderately dilated. Right Atrium: Right atrial size was severely dilated. Pericardium: Trivial pericardial effusion is present. There is no evidence of cardiac tamponade. Mitral Valve: The mitral valve is degenerative in appearance. There is mild thickening of the mitral valve leaflet(s). Moderate mitral valve regurgitation. No evidence of mitral valve stenosis. Tricuspid Valve: The tricuspid valve is degenerative in appearance. Tricuspid valve regurgitation is severe. No evidence of tricuspid stenosis. Aortic Valve: The aortic valve is tricuspid. Aortic valve regurgitation is not visualized. No aortic stenosis is present. Aortic valve mean gradient measures 5.0 mmHg. Aortic valve peak gradient measures 8.8 mmHg. Aortic valve area, by VTI measures 1.30 cm. Pulmonic Valve: The pulmonic valve was normal in structure. Pulmonic valve regurgitation is trivial. No evidence of pulmonic stenosis. Aorta: The aortic root and ascending aorta are structurally normal, with no evidence of dilitation. Venous: The inferior vena cava is dilated in size with less than 50% respiratory variability, suggesting right atrial pressure of 15 mmHg. IAS/Shunts: No atrial level shunt detected by color flow Doppler. Additional Comments: A device lead is visualized.  LEFT VENTRICLE PLAX 2D LVIDd:          4.30 cm   Diastology LVIDs:         3.00 cm   LV e' medial:    6.31 cm/s LV PW:         0.80 cm   LV E/e' medial:  17.7 LV IVS:        1.10 cm   LV e' lateral:   8.81 cm/s LVOT diam:     2.00 cm   LV E/e' lateral: 12.7 LV SV:         37 LV SV Index:   23 LVOT Area:     3.14 cm  RIGHT VENTRICLE            IVC RV S prime:     7.40 cm/s  IVC diam: 2.70 cm TAPSE (M-mode): 1.5 cm LEFT ATRIUM             Index        RIGHT ATRIUM           Index LA diam:        4.30 cm 2.62 cm/m   RA Area:     24.20 cm LA Vol (A2C):   69.4 ml 42.27 ml/m  RA Volume:   77.70 ml  47.33 ml/m LA Vol (A4C):   74.3 ml 45.26 ml/m LA Biplane Vol: 74.1 ml 45.14 ml/m  AORTIC VALVE AV Area (Vmax):    1.66 cm AV Area (Vmean):   1.69 cm AV Area (VTI):     1.30 cm AV Vmax:           148.00 cm/s AV Vmean:          99.500 cm/s AV VTI:  0.286 m AV Peak Grad:      8.8 mmHg AV Mean Grad:      5.0 mmHg LVOT Vmax:         78.10 cm/s LVOT Vmean:        53.500 cm/s LVOT VTI:          0.118 m LVOT/AV VTI ratio: 0.41  AORTA Ao Root diam: 2.70 cm Ao Asc diam:  3.20 cm MITRAL VALVE                TRICUSPID VALVE MV Area (PHT): 3.11 cm     TR Peak grad:   37.7 mmHg MV Decel Time: 244 msec     TR Vmax:        307.00 cm/s MV E velocity: 112.00 cm/s                             SHUNTS                             Systemic VTI:  0.12 m                             Systemic Diam: 2.00 cm Paula [name] Electronically signed by Paula [name] Signature Date/Time: 12/19/2023/1:58:05 PM    Final    DG Chest Port 1 View Result Date: 12/18/2023 CLINICAL DATA:  Shortness of breath EXAM: PORTABLE CHEST 1 VIEW COMPARISON:  12/11/2023 FINDINGS: Single frontal view of the chest demonstrates stable dual lead pacemaker. Cardiac silhouette is enlarged but stable. No acute airspace disease, effusion, or pneumothorax. No acute bony abnormalities. IMPRESSION: 1. Stable chest, no acute process. Electronically Signed   By: Ozell Daring M.D.   On: 12/18/2023  15:22   DG Chest 2 View Result Date: 12/11/2023 CLINICAL DATA:  Facial swelling and shortness of breath and cough. EXAM: CHEST - 2 VIEW COMPARISON:  01/16/2022 FINDINGS: The pacer wires are stable. No complicating features. The heart is borderline enlarged but stable. Stable tortuosity and calcification of the thoracic aorta. The lungs are clear of an acute process. No infiltrates, edema or effusions. Mild chronic left pleural thickening. No pulmonary lesions or pneumothorax. IMPRESSION: No acute cardiopulmonary findings. Electronically Signed   By: MYRTIS Stammer M.D.   On: 12/11/2023 14:36   CUP PACEART INCLINIC DEVICE CHECK Result Date: 12/01/2023 Full device check not complete. Presenting rhythm AF/VS 80-120. AT/AF burden 13%. On Warfarin per EPIC. Multiple AMS episodes noted c/w AFL/AF. Rates uncontrolled at times. Dr. Waddell attempt Atrial NIPS during OV. Unsuccessful. Programming Changes: - PVAB programmed from to 70ms d/t undersensing AF at times.Rozelle Banter, BSN, RN   Microbiology: Results for orders placed or performed during the hospital encounter of 12/18/23  Resp panel by RT-PCR (RSV, Flu A&B, Covid) Anterior Nasal Swab     Status: None   Collection Time: 12/18/23  3:19 PM   Specimen: Anterior Nasal Swab  Result Value Ref Range Status   SARS Coronavirus 2 by RT PCR NEGATIVE NEGATIVE Final    Comment: (NOTE) SARS-CoV-2 target nucleic acids are NOT DETECTED.  The SARS-CoV-2 RNA is generally detectable in upper respiratory specimens during the acute phase of infection. The lowest concentration of SARS-CoV-2 viral copies this assay can detect is 138 copies/mL. A negative result does not preclude SARS-Cov-2 infection and should not be used as the sole basis for  treatment or other patient management decisions. A negative result may occur with  improper specimen collection/handling, submission of specimen other than nasopharyngeal swab, presence of viral mutation(s) within  the areas targeted by this assay, and inadequate number of viral copies(<138 copies/mL). A negative result must be combined with clinical observations, patient history, and epidemiological information. The expected result is Negative.  Fact Sheet for Patients:  bloggercourse.com  Fact Sheet for Healthcare Providers:  seriousbroker.it  This test is no t yet approved or cleared by the [place]  FDA and  has been authorized for detection and/or diagnosis of SARS-CoV-2 by FDA under an Emergency Use Authorization (EUA). This EUA will remain  in effect (meaning this test can be used) for the duration of the COVID-19 declaration under Section 564(b)(1) of the Act, 21 U.S.C.section 360bbb-3(b)(1), unless the authorization is terminated  or revoked sooner.       Influenza A by PCR NEGATIVE NEGATIVE Final   Influenza B by PCR NEGATIVE NEGATIVE Final    Comment: (NOTE) The Xpert Xpress SARS-CoV-2/FLU/RSV plus assay is intended as an aid in the diagnosis of influenza from Nasopharyngeal swab specimens and should not be used as a sole basis for treatment. Nasal washings and aspirates are unacceptable for Xpert Xpress SARS-CoV-2/FLU/RSV testing.  Fact Sheet for Patients: bloggercourse.com  Fact Sheet for Healthcare Providers: seriousbroker.it  This test is not yet approved or cleared by the [place]  FDA and has been authorized for detection and/or diagnosis of SARS-CoV-2 by FDA under an Emergency Use Authorization (EUA). This EUA will remain in effect (meaning this test can be used) for the duration of the COVID-19 declaration under Section 564(b)(1) of the Act, 21 U.S.C. section 360bbb-3(b)(1), unless the authorization is terminated or revoked.     Resp Syncytial Virus by PCR NEGATIVE NEGATIVE Final    Comment: (NOTE) Fact Sheet for  Patients: bloggercourse.com  Fact Sheet for Healthcare Providers: seriousbroker.it  This test is not yet approved or cleared by the [place]  FDA and has been authorized for detection and/or diagnosis of SARS-CoV-2 by FDA under an Emergency Use Authorization (EUA). This EUA will remain in effect (meaning this test can be used) for the duration of the COVID-19 declaration under Section 564(b)(1) of the Act, 21 U.S.C. section 360bbb-3(b)(1), unless the authorization is terminated or revoked.  Performed at [facility], [address] [place], [place] [address]     Labs: CBC: Recent Labs  Lab 12/18/23 1451 12/19/23 0259 12/20/23 0159  WBC 6.5 6.0 6.4  NEUTROABS 4.9  --   --   HGB 11.0* 10.8* 10.8*  HCT 32.9* 31.7* 31.9*  MCV 85.7 83.9 85.8  PLT 269 259 260   Basic Metabolic Panel: Recent Labs  Lab 12/18/23 1451 12/18/23 1909 12/18/23 2237 12/19/23 0259 12/20/23 0825  NA 125* 128* 128* 131* 128*  K 3.4*  --   --   --  2.9*  CL 88*  --   --   --  91*  CO2 25  --   --   --  25  GLUCOSE 160*  --   --   --  107*  BUN 12  --   --   --  11  CREATININE 1.04*  --   --   --  1.01*  CALCIUM  8.9  --   --   --  7.9*   Liver Function Tests: Recent Labs  Lab 12/18/23 1451  AST 47*  ALT 45*  ALKPHOS 92  BILITOT 0.5  PROT 6.6  ALBUMIN 3.7   CBG: No results for input(s): GLUCAP in the last 168 hours.  Discharge time spent: greater than 30 minutes.  Signed: Nena Rebel, MD [facility] 12/20/2023

## 2023-12-20 NOTE — Plan of Care (Signed)
  Problem: Education: Goal: Knowledge of General Education information will improve Description: Including pain rating scale, medication(s)/side effects and non-pharmacologic comfort measures Outcome: Progressing   Problem: Clinical Measurements: Goal: Ability to maintain clinical measurements within normal limits will improve Outcome: Progressing   Problem: Clinical Measurements: Goal: Will remain free from infection Outcome: Progressing   Problem: Clinical Measurements: Goal: Diagnostic test results will improve Outcome: Progressing   Problem: Safety: Goal: Ability to remain free from injury will improve Outcome: Progressing   Problem: Skin Integrity: Goal: Risk for impaired skin integrity will decrease Outcome: Progressing

## 2023-12-21 DIAGNOSIS — E785 Hyperlipidemia, unspecified: Secondary | ICD-10-CM | POA: Diagnosis not present

## 2023-12-21 DIAGNOSIS — I480 Paroxysmal atrial fibrillation: Secondary | ICD-10-CM | POA: Diagnosis not present

## 2023-12-21 DIAGNOSIS — E039 Hypothyroidism, unspecified: Secondary | ICD-10-CM | POA: Diagnosis not present

## 2023-12-21 DIAGNOSIS — I509 Heart failure, unspecified: Secondary | ICD-10-CM | POA: Diagnosis not present

## 2023-12-21 DIAGNOSIS — I10 Essential (primary) hypertension: Secondary | ICD-10-CM | POA: Diagnosis not present

## 2023-12-21 DIAGNOSIS — N1831 Chronic kidney disease, stage 3a: Secondary | ICD-10-CM | POA: Diagnosis not present

## 2023-12-21 NOTE — Telephone Encounter (Signed)
 I sent a message last week about adding her to my schedule on 12/5 at 10 AM but looks like it has not been done.  Can we add her on at this time?

## 2023-12-22 NOTE — Telephone Encounter (Signed)
 Called patient and left message to call office. Left message that Dr. Kate would like to schedule visit 12/5 @10 :00. Left message to call office to confirm appt.

## 2023-12-25 DIAGNOSIS — I480 Paroxysmal atrial fibrillation: Secondary | ICD-10-CM | POA: Diagnosis not present

## 2023-12-25 DIAGNOSIS — K5901 Slow transit constipation: Secondary | ICD-10-CM | POA: Diagnosis not present

## 2023-12-25 DIAGNOSIS — N1831 Chronic kidney disease, stage 3a: Secondary | ICD-10-CM | POA: Diagnosis not present

## 2023-12-25 DIAGNOSIS — D6869 Other thrombophilia: Secondary | ICD-10-CM | POA: Diagnosis not present

## 2023-12-25 DIAGNOSIS — E039 Hypothyroidism, unspecified: Secondary | ICD-10-CM | POA: Diagnosis not present

## 2023-12-25 DIAGNOSIS — R21 Rash and other nonspecific skin eruption: Secondary | ICD-10-CM | POA: Diagnosis not present

## 2023-12-25 DIAGNOSIS — I10 Essential (primary) hypertension: Secondary | ICD-10-CM | POA: Diagnosis not present

## 2023-12-25 DIAGNOSIS — I5040 Unspecified combined systolic (congestive) and diastolic (congestive) heart failure: Secondary | ICD-10-CM | POA: Diagnosis not present

## 2023-12-25 DIAGNOSIS — I422 Other hypertrophic cardiomyopathy: Secondary | ICD-10-CM | POA: Diagnosis not present

## 2023-12-25 NOTE — Progress Notes (Unsigned)
 Cardiology Office Note:    Date:  12/26/2023   ID:  Ventura Leggitt, DOB 03-31-1935, MRN [id number]  PCP:  Charlott Dorn LABOR, MD  Cardiologist:  [name], MD  Electrophysiologist:  OLE ONEIDA HOLTS, MD   Referring MD: Charlott Dorn LABOR, *   No chief complaint on file.   History of Present Illness:    Paula [name] is a 88 y.o. female with a hx of paroxysmal atrial fibrillation, tachybrady syndrome status post PPM, hypertension, hypothyroidism who presents for follow-up.  She was referred by Quita Kicks, PA for evaluation of hypertrophic cardiomyopathy, initially seen on 06/14/2019.  Diagnosed with atrial fibrillation after presenting with palpitations to PCP office in April 2021. EKG showed A. fib with RVR. Started on Eliquis  given CHA2DS2-VASc score 5.  Also started on diltiazem  for rate control. She was seen in AF clinic on 05/11/2019, she was noted to be in normal sinus rhythm. She was continued on Eliquis  5 mg twice daily and diltiazem  180 mg daily. TTE on 05/27/19 was concerning for apical variant hypertrophic cardiomyopathy, EF 60 to 65%, normal RV function, moderate elevation in RVSP (48 mmHg), mild mitral regurgitation, mild to moderate tricuspid regurgitation.  Cardiac MRI on 07/05/2019 showed LV apical hypertrophy measuring up to 15 mm, consistent with apical hypertrophic cardiomyopathy, small apical aneurysm, patchy apical LGE accounting for 1% of total myocardial mass, hyperdynamic LV systolic function, normal RV function, mild MR (regurgitant fraction 24%).  Zio patch x7 days on 07/14/2019 showed AF burden 47%, with episode lasting 3 days and average rate 118 bpm, no NSVT, frequent episodes of SVT, longest lasting 19 seconds.  She was admitted in June 2022 with near syncope, underwent PPM for tachybrady syndrome.  Echo 02/11/2022 showed EF 60 to 65%, severe asymmetric LV apical hypertrophy, normal RV function, severe left atrial enlargement, moderate  to severe mitral regurgitation, moderate to severe tricuspid regurgitation.  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  Echocardiogram 03/2023 shows EF 60 to 65%, normal RV function, RVSP 61 mmHg, severe left atrial enlargement, moderate MR, severe TR, mild aortic stenosis, RAP 3.  Since last clinic visit, she was admitted 1127 through 12/20/2023 with acute on chronic heart failure.  Reported 10 pound weight gain.  Echocardiogram 12/19/2023 showed EF 40 to 45%, mildly reduced RV function, moderate atrial enlargement, severe right atrial enlargement, RAP 15, moderate mitral regurgitation, severe tricuspid regurgitation.  She reports that since discharge from the hospital she has continued to feel short of breath and have edema.  She denies any chest pain.  Denies any lightheadedness or syncope.    Wt Readings from Last 3 Encounters:  12/26/23 145 lb 9.6 oz (66 kg)  12/20/23 130 lb 15.3 oz (59.4 kg)  12/01/23 134 lb 4.8 oz (60.9 kg)   BP Readings from Last 3 Encounters:  12/26/23 100/68  12/20/23 (!) 98/59  12/11/23 101/71     Past Medical History:  Diagnosis Date   Anemia    Atrial fibrillation (HCC)    CHF (congestive heart failure) (HCC)    DVT (deep venous thrombosis) (HCC)    in her heart   Hyperlipidemia    Hypertension    Hypothyroidism    Irregular heart rate  Past Surgical History:  Procedure Laterality Date   APPENDECTOMY     in early 1970's   PACEMAKER IMPLANT N/A 07/17/2020   Procedure: PACEMAKER IMPLANT;  Surgeon: Waddell Danelle ORN, MD;  Location[facility] INVASIVE CV LAB;  Service: Cardiovascular;  Laterality: N/A;   salvia gland  2012   Dr Carlie   VAGINAL  HYSTERECTOMY     partial    Current Medications: Current Meds  Medication Sig   acetaminophen  (TYLENOL ) 325 MG tablet Take 650 mg by mouth every 4 (four) hours as needed for moderate pain (pain score 4-6) or headache.   Calcium  Carb-Cholecalciferol 600-5 MG-MCG TABS Take 1 tablet by mouth daily at 6 (six) AM.   diltiazem  (CARDIZEM  CD) 300 MG 24 hr capsule TAKE 1 CAPSULE EVERY DAY   dronedarone  (MULTAQ ) 400 MG tablet Take 1 tablet (400 mg total) by mouth 2 (two) times daily with a meal.   famotidine  (PEPCID  AC) 10 MG tablet Take 10 mg by mouth at bedtime.   furosemide  (LASIX ) 20 MG tablet Take 1 tablet (20 mg total) by mouth daily.   levothyroxine  (SYNTHROID ) 125 MCG tablet Take 125 mcg by mouth daily before breakfast.   linaclotide  (LINZESS ) 290 MCG CAPS capsule Take 1 capsule (290 mcg total) by mouth daily before breakfast.   metoprolol  succinate (TOPROL  XL) 25 MG 24 hr tablet Take 1 tablet (25 mg total) by mouth daily. At night after dinner   Multiple Vitamin (MULTIVITAMIN WITH MINERALS) TABS tablet Take 1 tablet by mouth daily.   polyethylene glycol (MIRALAX  / GLYCOLAX ) 17 g packet Take 17 g by mouth at bedtime.   sodium chloride  (OCEAN) 0.65 % SOLN nasal spray Place 1 spray into both nostrils as needed for congestion.   triamcinolone cream (KENALOG) 0.1 % Apply 1 Application topically 2 (two) times daily as needed (itching).   valsartan  (DIOVAN ) 160 MG tablet Take 1 tablet (160 mg total) by mouth daily. (Patient taking differently: Take 160 mg by mouth every evening.)   warfarin (COUMADIN ) 4 MG tablet TAKE 1 TABLET BY MOUTH ONCE DAILY OR AS DIRECTED BY ANTICOAGULATION CLINIC (Patient taking differently: Take 4 mg by mouth daily at 4 PM. Take 1 tablet by mouth once daily or as directed by anticoagulation clinic)     Allergies:   Eliquis  [apixaban ] and Wound dressing adhesive   Social History   Socioeconomic History   Marital status: Married    Spouse name: Beryl   Number of  children: 1   Years of education: Not on file   Highest education level: Not on file  Occupational History   Not on file  Tobacco Use   Smoking status: Never   Smokeless tobacco: Never  Vaping Use   Vaping status: Never Used  Substance and Sexual Activity   Alcohol use: Never    Alcohol/week: 1.0 standard drink of alcohol    Types: 1 Glasses of wine per week   Drug use: Never   Sexual activity: Not Currently    Partners: Male    Birth control/protection: Post-menopausal    Comment: married  Other Topics Concern   Not on file  Social History Narrative   Not on file   Social Drivers of Health   Financial Resource Strain: Not on file  Food Insecurity: No Food Insecurity (12/18/2023)   Hunger Vital Sign    Worried About Running Out of Food in the Last Year: Never true    Ran Out of Food in the Last Year: Never true  Transportation Needs: No Transportation Needs (12/18/2023)   PRAPARE - Administrator, Civil Service (Medical): No    Lack of Transportation (Non-Medical): No  Physical Activity: Not on file  Stress: Not on file  Social Connections: Socially Integrated (12/18/2023)   Social Connection and Isolation Panel    Frequency of Communication with Friends and Family: More than three times a week    Frequency of Social Gatherings with Friends and Family: More than three times a week    Attends Religious Services: More than 4 times per year    Active Member of [employer] or Organizations: Yes    Attends Engineer, Structural: More than 4 times per year    Marital Status: Married     Family History: The patient's family history includes Breast cancer (age of onset: 3) in her sister; Cancer in her father; Other in her mother. There is no history of Colon cancer, Esophageal cancer, or Rectal cancer.  ROS:   Please see the history of present illness.     All other systems reviewed and are negative.  EKGs/Labs/Other Studies Reviewed:    The following  studies were reviewed today:  Monitor 05/01/2020: No significant abnormalities   Patch Wear Time:  2 days and 23 hours (2022-04-01T16:38:40-0400 to 2022-04-04T16:10:58-0400)   Patient had a min HR of 35 bpm, max HR of 119 bpm, and avg HR of 61 bpm. Predominant underlying rhythm was Sinus Rhythm. 2 Supraventricular Tachycardia runs occurred, the run with the fastest interval lasting 4 beats with a max rate of 119 bpm, the longest lasting 4 beats with an avg rate of 98 bpm. Isolated SVEs were rare (<1.0%), SVE Couplets were rare (<1.0%), and SVE Triplets were rare (<1.0%). Isolated VEs were rare (<1.0%), and no VE Couplets or VE Triplets were present.  No patient triggered events  Monitor 07/14/2019: Episode of atrial fibrillation lasting 3 days 6 hours, average rate 118 bpm. AF burden 47%. 141 episodes of SVT, longest lasting 18.5 seconds.   7 days of data recorded on Zio monitor. Patient had a min HR of 55 bpm, max HR of 207 bpm, and avg HR of 95 bpm. Predominant underlying rhythm was Sinus Rhythm. No VT, high degree block, or pauses noted.  141 episodes of SVT, longest lasting 18.5 seconds.  Episode of atrial fibrillation lasting 3 days 6 hours, average rate 118 bpm.  AF burden 47%.   Isolated atrial and ventricular ectopy was rare (<1%). There were 0 triggered events.  CMR 07/05/2019: 1. LV hypertrophy at apex measuring up to 15mm, consistent with apical hypertrophic cardiomyopathy   2.  Small apical aneurysm   3. Patchy LGE at apex, consistent with apical HCM. LGE accounts for 1% of total myocardial mass   4.  Normal LV size with hyperdynamic systolic function (EF 70%)   5.  Normal RV size and systolic function (EF 58%)   6. Mild mitral regurgitation (regurgitant volume 16cc, regurgitant fraction 24%)  Echo 05/27/2019: 1. Apical variant hypertrophic cardiomyopathy with no evidence of apical  thrombus. Left ventricular ejection fraction, by estimation, is 60 to 65%.  The left  ventricle has normal function. The left ventricle demonstrates  regional wall motion  abnormalities (see scoring diagram/findings for description). There is  severe left ventricular hypertrophy of the apical segment. Left  ventricular diastolic parameters are consistent with Grade I diastolic  dysfunction (impaired relaxation).   2. Right ventricular systolic function is normal. The right ventricular  size is normal. There  is moderately elevated pulmonary artery systolic  pressure. The estimated right ventricular systolic pressure is 47.9 mmHg.   3. Left atrial size was mildly dilated.   4. The mitral valve is normal in structure. Mild mitral valve  regurgitation. No evidence of mitral stenosis.   5. Tricuspid valve regurgitation is mild to moderate.   6. Possible fusion of right and non coronary cusp. The aortic valve is  normal in structure. Aortic valve regurgitation is not visualized. Mild to  moderate aortic valve sclerosis/calcification is present, without any  evidence of aortic stenosis.   7. The inferior vena cava is normal in size with greater than 50%  respiratory variability, suggesting right atrial pressure of 3 mmHg.  Exercise Myoview  07/12/2016: Nuclear stress EF: 64%. Blood pressure demonstrated a normal response to exercise. Horizontal ST segment depression ST segment depression was noted during stress in the III, aVF, V4, V5 and V6 leads, and returning to baseline after 1-5 minutes of recovery. Findings are nonspecific given baseline T wave inversions. The study is normal. This is a low risk study. The left ventricular ejection fraction is normal (55-65%).   EKG:   10/06/2023: Atrial paced, rate 63, nonspecific T wave flattening 09/12/2022: Atrial paced rhythm, rate 60, poor R wave progression, nonspecific T wave flattening 01/08/2022: Atrial paced, rate 60, poor R wave progression, nonspecific T wave flattening 07/04/2021: Atrial paced, rate 60, Q waves in V1/2,  nonspecific T wave flattening 12/27/20: Atrial paced, rate 60, Q waves in V1/2, no ST abnormalities 07/10/2020: EKG is not ordered today. 04/14/2020: sinus rhythm, rate 74, LVH with repolarization abnormalities  Recent Labs: 12/18/2023: ALT 45; Pro Brain Natriuretic Peptide 2,070.0; TSH 15.804 12/20/2023: BUN 14; Creatinine, Ser 0.98; Hemoglobin 10.8; Platelets 260; Potassium 4.5; Sodium 129  Recent Lipid Panel    Component Value Date/Time   CHOL 207 (H) 12/04/2022 1201   TRIG 72 12/04/2022 1201   HDL 70 12/04/2022 1201   CHOLHDL 3.0 12/04/2022 1201   LDLCALC 124 (H) 12/04/2022 1201    Physical Exam:    VS:  BP 100/68   Pulse 100   Ht 5' 2.5 (1.588 m)   Wt 145 lb 9.6 oz (66 kg)   SpO2 100%   BMI 26.21 kg/m     Wt Readings from Last 3 Encounters:  12/26/23 145 lb 9.6 oz (66 kg)  12/20/23 130 lb 15.3 oz (59.4 kg)  12/01/23 134 lb 4.8 oz (60.9 kg)     GEN: in no acute distress HEENT: Normal NECK: + JVD; No carotid bruits CARDIAC: RRR, 2/6 systolic murmur RESPIRATORY: Diminished breath sounds ABDOMEN: Soft, non-tender, non-distended MUSCULOSKELETAL:  1+  edema; No deformity  SKIN: Warm and dry NEUROLOGIC:  Alert and oriented x 3 PSYCHIATRIC:  Normal affect   ASSESSMENT:    1. Acute systolic heart failure (HCC)   2. Persistent atrial fibrillation (HCC)   3. LV (left ventricular) mural thrombus   4. Tricuspid valve insufficiency, unspecified etiology   5. Mitral valve insufficiency, unspecified etiology       PLAN:    Acute systolic heart failure: Weight is up 15 pounds from last clinic visit.  She is volume overloaded on exam.  She was admitted to [facility] last week for volume overload, and echo showed new systolic dysfunction, with EF 40 to 45%.  Cardiology was not consulted and she was only admitted from 11/27 through 11/29 and appears received minimal diuresis.  She needs admission for IV diuresis, recommend admission to medicine with cardiology consult.  Suspect her  new systolic heart failure is due to persistent A-fib.  She is currently on Multaq  as amiodarone  was discontinued due to transaminitis.  Would recommend diuresis and once euvolemic attempt cardioversion.  If unable to restore sinus rhythm on Multaq , will need to discuss antiarrhythmic options with the EP.  Hypertrophic cardiomyopathy: cardiac MRI on 07/05/2019 showed LV apical hypertrophy measuring up to 15 mm, consistent with apical hypertrophic cardiomyopathy, small apical aneurysm, patchy apical LGE accounting for 1% of total myocardial mass.  No NSVT on Zio patch x 7 days.  Echo 02/11/2022 showed EF 60 to 65%, severe asymmetric LV apical hypertrophy, normal RV function, severe left atrial enlargement, moderate to severe mitral regurgitation, moderate to severe tricuspid regurgitation.  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  This was developed despite being on Eliquis .  Recommended switching to warfarin -Follows in Coumadin  clinic for INR management  Mitral regurgitation/tricuspid regurgitation: Moderate to severe MR and moderate TR on CMR 03/2022.  Echo 03/2023 showed moderate MR, severe TR.  Will monitor  Persistent atrial fibrillation: Diagnosed 04/2019 after presenting to PCPs office with palpitations. CHA2DS2-VASc score 5.  Zio patch x7 days on 07/14/2019 showed AF burden 47%, with episode lasting 3 days and average rate 118 bpm.  She was admitted in June 2022 with near syncope, underwent PPM for  tachybrady syndrome. -Continue warfarin, follows in Coumadin  clinic -Was on amiodarone  200 mg daily.  Transaminitis noted on labs, dose reduced to 100 mg daily, then eventually discontinued.  She is currently on Multaq .  Follows with EP.  Suspect A-fib is urology of new systolic heart failure as above, recommend cardioversion -Continue diltiazem  300 mg daily  SVT: Frequent episodes on ZIO monitoring, longest lasting 19 seconds.  Continue diltiazem   Hypertension: On valsartan  240 mg mg daily, prazosin  1 mg nightly, diltiazem  300 mg daily.  BP appears controlled  Hyperlipidemia: Has been on rosuvastatin  5 mg daily, but held due to transaminitis.  LDL 124 on 12/04/2022  Transaminitis: elevation in liver enzymes noted.  Amiodarone  dose decreased to 100 mg daily and then eventually discontinued.  Rosuvastatin  discontinued.  Liver ultrasound unremarkable.  Referred to hepatology for evaluation.  Transaminitis has improved   RTC in 2 weeks   Medication Adjustments/Labs and Tests Ordered: Current medicines are reviewed at length with the patient today.  Concerns regarding medicines are outlined above.  Orders Placed This Encounter  Procedures   EKG 12-Lead    No orders of the defined types were placed in this encounter.     There are no Patient Instructions on file for this visit.    Signed, [name], MD  12/26/2023 10:19 AM    [facility]

## 2023-12-26 ENCOUNTER — Other Ambulatory Visit: Payer: Self-pay

## 2023-12-26 ENCOUNTER — Ambulatory Visit: Attending: Cardiology | Admitting: Cardiology

## 2023-12-26 ENCOUNTER — Encounter: Payer: Self-pay | Admitting: Cardiology

## 2023-12-26 ENCOUNTER — Inpatient Hospital Stay (HOSPITAL_COMMUNITY)
Admission: EM | Admit: 2023-12-26 | Discharge: 2024-01-02 | DRG: 291 | Disposition: A | Attending: Internal Medicine | Admitting: Internal Medicine

## 2023-12-26 ENCOUNTER — Emergency Department (HOSPITAL_COMMUNITY)

## 2023-12-26 ENCOUNTER — Encounter (HOSPITAL_COMMUNITY): Payer: Self-pay

## 2023-12-26 VITALS — BP 100/68 | HR 100 | Ht 62.5 in | Wt 145.6 lb

## 2023-12-26 DIAGNOSIS — I5021 Acute systolic (congestive) heart failure: Secondary | ICD-10-CM

## 2023-12-26 DIAGNOSIS — I513 Intracardiac thrombosis, not elsewhere classified: Secondary | ICD-10-CM | POA: Diagnosis present

## 2023-12-26 DIAGNOSIS — I071 Rheumatic tricuspid insufficiency: Secondary | ICD-10-CM

## 2023-12-26 DIAGNOSIS — I4892 Unspecified atrial flutter: Secondary | ICD-10-CM | POA: Diagnosis not present

## 2023-12-26 DIAGNOSIS — I110 Hypertensive heart disease with heart failure: Secondary | ICD-10-CM | POA: Diagnosis not present

## 2023-12-26 DIAGNOSIS — I422 Other hypertrophic cardiomyopathy: Secondary | ICD-10-CM

## 2023-12-26 DIAGNOSIS — I340 Nonrheumatic mitral (valve) insufficiency: Secondary | ICD-10-CM | POA: Diagnosis not present

## 2023-12-26 DIAGNOSIS — I4819 Other persistent atrial fibrillation: Secondary | ICD-10-CM

## 2023-12-26 DIAGNOSIS — I5043 Acute on chronic combined systolic (congestive) and diastolic (congestive) heart failure: Secondary | ICD-10-CM | POA: Diagnosis not present

## 2023-12-26 DIAGNOSIS — E871 Hypo-osmolality and hyponatremia: Secondary | ICD-10-CM | POA: Diagnosis not present

## 2023-12-26 DIAGNOSIS — I130 Hypertensive heart and chronic kidney disease with heart failure and stage 1 through stage 4 chronic kidney disease, or unspecified chronic kidney disease: Secondary | ICD-10-CM | POA: Diagnosis not present

## 2023-12-26 DIAGNOSIS — R0602 Shortness of breath: Principal | ICD-10-CM

## 2023-12-26 DIAGNOSIS — I509 Heart failure, unspecified: Secondary | ICD-10-CM

## 2023-12-26 DIAGNOSIS — Z950 Presence of cardiac pacemaker: Secondary | ICD-10-CM | POA: Diagnosis present

## 2023-12-26 DIAGNOSIS — E039 Hypothyroidism, unspecified: Secondary | ICD-10-CM | POA: Diagnosis not present

## 2023-12-26 DIAGNOSIS — I253 Aneurysm of heart: Secondary | ICD-10-CM | POA: Diagnosis not present

## 2023-12-26 DIAGNOSIS — I081 Rheumatic disorders of both mitral and tricuspid valves: Secondary | ICD-10-CM | POA: Diagnosis not present

## 2023-12-26 DIAGNOSIS — I480 Paroxysmal atrial fibrillation: Secondary | ICD-10-CM | POA: Diagnosis present

## 2023-12-26 DIAGNOSIS — N1831 Chronic kidney disease, stage 3a: Secondary | ICD-10-CM | POA: Diagnosis not present

## 2023-12-26 DIAGNOSIS — I5033 Acute on chronic diastolic (congestive) heart failure: Secondary | ICD-10-CM | POA: Diagnosis not present

## 2023-12-26 DIAGNOSIS — I272 Pulmonary hypertension, unspecified: Secondary | ICD-10-CM | POA: Diagnosis not present

## 2023-12-26 DIAGNOSIS — I10 Essential (primary) hypertension: Secondary | ICD-10-CM | POA: Diagnosis present

## 2023-12-26 DIAGNOSIS — I495 Sick sinus syndrome: Secondary | ICD-10-CM | POA: Diagnosis not present

## 2023-12-26 DIAGNOSIS — I50811 Acute right heart failure: Secondary | ICD-10-CM | POA: Diagnosis present

## 2023-12-26 DIAGNOSIS — Z5181 Encounter for therapeutic drug level monitoring: Secondary | ICD-10-CM

## 2023-12-26 DIAGNOSIS — Z7901 Long term (current) use of anticoagulants: Secondary | ICD-10-CM

## 2023-12-26 LAB — BASIC METABOLIC PANEL WITH GFR
Anion gap: 10 (ref 5–15)
BUN: 15 mg/dL (ref 8–23)
CO2: 25 mmol/L (ref 22–32)
Calcium: 8.4 mg/dL — ABNORMAL LOW (ref 8.9–10.3)
Chloride: 98 mmol/L (ref 98–111)
Creatinine, Ser: 1.12 mg/dL — ABNORMAL HIGH (ref 0.44–1.00)
GFR, Estimated: 48 mL/min — ABNORMAL LOW (ref >60)
Glucose, Bld: 98 mg/dL (ref 70–99)
Potassium: 4.2 mmol/L (ref 3.5–5.1)
Sodium: 133 mmol/L — ABNORMAL LOW (ref 135–145)

## 2023-12-26 LAB — TSH: TSH: 13.882 u[IU]/mL — ABNORMAL HIGH (ref 0.350–4.500)

## 2023-12-26 LAB — CBC
HCT: 34.4 % — ABNORMAL LOW (ref 36.0–46.0)
Hemoglobin: 11.2 g/dL — ABNORMAL LOW (ref 12.0–15.0)
MCH: 28.9 pg (ref 26.0–34.0)
MCHC: 32.6 g/dL (ref 30.0–36.0)
MCV: 88.9 fL (ref 80.0–100.0)
Platelets: 296 K/uL (ref 150–400)
RBC: 3.87 MIL/uL (ref 3.87–5.11)
RDW: 14.2 % (ref 11.5–15.5)
WBC: 6.1 K/uL (ref 4.0–10.5)
nRBC: 0 % (ref 0.0–0.2)

## 2023-12-26 LAB — PROTIME-INR
INR: 2.1 — ABNORMAL HIGH (ref 0.8–1.2)
Prothrombin Time: 24.5 s — ABNORMAL HIGH (ref 11.4–15.2)

## 2023-12-26 LAB — BRAIN NATRIURETIC PEPTIDE: B Natriuretic Peptide: 763 pg/mL — ABNORMAL HIGH (ref 0.0–100.0)

## 2023-12-26 LAB — MAGNESIUM: Magnesium: 2.1 mg/dL (ref 1.7–2.4)

## 2023-12-26 MED ORDER — FUROSEMIDE 10 MG/ML IJ SOLN
40.0000 mg | Freq: Once | INTRAMUSCULAR | Status: AC
  Administered 2023-12-26: 40 mg via INTRAVENOUS

## 2023-12-26 MED ORDER — POTASSIUM CHLORIDE CRYS ER 20 MEQ PO TBCR
20.0000 meq | EXTENDED_RELEASE_TABLET | Freq: Every day | ORAL | Status: DC
  Administered 2023-12-27 – 2023-12-30 (×4): 20 meq via ORAL
  Filled 2023-12-26 (×4): qty 1

## 2023-12-26 MED ORDER — MAGNESIUM OXIDE -MG SUPPLEMENT 400 (240 MG) MG PO TABS
400.0000 mg | ORAL_TABLET | Freq: Every day | ORAL | Status: DC
  Administered 2023-12-27 – 2024-01-02 (×7): 400 mg via ORAL
  Filled 2023-12-26 (×7): qty 1

## 2023-12-26 MED ORDER — WARFARIN - PHARMACIST DOSING INPATIENT: Freq: Every day | Status: DC

## 2023-12-26 MED ORDER — LEVOTHYROXINE SODIUM 25 MCG PO TABS
125.0000 ug | ORAL_TABLET | Freq: Every day | ORAL | Status: DC
  Administered 2023-12-27 – 2023-12-29 (×3): 125 ug via ORAL
  Filled 2023-12-26 (×3): qty 1

## 2023-12-26 MED ORDER — ONDANSETRON HCL 4 MG/2ML IJ SOLN: 4.0000 mg | Freq: Four times a day (QID) | INTRAMUSCULAR | Status: DC | PRN

## 2023-12-26 MED ORDER — ONDANSETRON HCL 4 MG PO TABS: 4.0000 mg | ORAL_TABLET | Freq: Four times a day (QID) | ORAL | Status: DC | PRN

## 2023-12-26 MED ORDER — ACETAMINOPHEN 650 MG RE SUPP: 650.0000 mg | Freq: Four times a day (QID) | RECTAL | Status: DC | PRN

## 2023-12-26 MED ORDER — FUROSEMIDE 10 MG/ML IJ SOLN
40.0000 mg | Freq: Two times a day (BID) | INTRAMUSCULAR | Status: DC
  Administered 2023-12-26 – 2023-12-29 (×6): 40 mg via INTRAVENOUS
  Filled 2023-12-26 (×6): qty 4

## 2023-12-26 MED ORDER — POLYETHYLENE GLYCOL 3350 17 G PO PACK: 17.0000 g | PACK | Freq: Every day | ORAL | Status: DC | PRN

## 2023-12-26 MED ORDER — METOPROLOL SUCCINATE ER 25 MG PO TB24
25.0000 mg | ORAL_TABLET | Freq: Every day | ORAL | Status: DC
  Administered 2023-12-26 – 2024-01-02 (×7): 25 mg via ORAL
  Filled 2023-12-26 (×8): qty 1

## 2023-12-26 MED ORDER — DRONEDARONE HCL 400 MG PO TABS
400.0000 mg | ORAL_TABLET | Freq: Two times a day (BID) | ORAL | Status: DC
  Administered 2023-12-27 – 2023-12-29 (×5): 400 mg via ORAL
  Filled 2023-12-26 (×7): qty 1

## 2023-12-26 MED ORDER — WARFARIN SODIUM 2 MG PO TABS
4.0000 mg | ORAL_TABLET | Freq: Once | ORAL | Status: AC
  Administered 2023-12-26: 4 mg via ORAL
  Filled 2023-12-26: qty 2
  Filled 2023-12-26: qty 1

## 2023-12-26 MED ORDER — FUROSEMIDE 10 MG/ML IJ SOLN
40.0000 mg | Freq: Once | INTRAMUSCULAR | Status: DC
  Filled 2023-12-26: qty 4

## 2023-12-26 MED ORDER — SACUBITRIL-VALSARTAN 24-26 MG PO TABS
1.0000 | ORAL_TABLET | Freq: Two times a day (BID) | ORAL | Status: DC
  Administered 2023-12-26 – 2023-12-31 (×11): 1 via ORAL
  Filled 2023-12-26 (×11): qty 1

## 2023-12-26 MED ORDER — FUROSEMIDE 10 MG/ML IJ SOLN: 40.0000 mg | Freq: Every day | INTRAMUSCULAR | Status: DC

## 2023-12-26 MED ORDER — ACETAMINOPHEN 325 MG PO TABS: 650.0000 mg | ORAL_TABLET | Freq: Four times a day (QID) | ORAL | Status: DC | PRN

## 2023-12-26 NOTE — Consult Note (Addendum)
 Cardiology Consultation   Patient ID: Paula [name] MRN: [id number]; DOB: 12/21/1935  Admit date: 12/26/2023 Date of Consult: 12/26/2023  PCP:  Charlott Dorn LABOR, MD   [facility] Providers Cardiologist:  Paula LITTIE Nanas, MD  Electrophysiologist:  OLE ONEIDA HOLTS, MD    Patient Profile: Paula [name] is a 88 y.o. female with a hx of paroxysmal atrial fibrillation, tachybradycardia syndrome s/p PPM, hypertension, hypothyroidism who is being seen 12/26/2023 for the evaluation of CHF, A-fib at the request of Dr. Jackquline.  History of Present Illness: Paula [name] is an 88 year old female with above medical history.  She is followed by Dr. Nanas.   Patient was diagnosed with atrial fibrillation 04/2019 after being evaluated by their primary care provider for palpitations.  Patient was started on diltiazem  for rate control and Eliquis .  Seen in A-fib clinic shortly after and was found to be back in normal sinus rhythm.  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  Patient underwent cardiac MRI on 07/05/2019 that showed LV hypertrophy at apex measuring up to 15 mm, consistent with apical hypertrophic cardiomyopathy, small apical aneurysm, patchy apical LGE accounting for 1% of total myocardial mass.  Patient continued to have A-fib with RVR despite diltiazem  titration.  Had a syncopal episode in 06/2020 and was found to be bradycardic.  Had a pacemaker implanted for tachybradycardia syndrome.  Echocardiogram in 01/2022 showed EF 60-65%, no regional wall motion abnormalities, severe asymmetric LVH, normal RV systolic function, severely elevated PA systolic pressure, moderate to severe mitral valve regurgitation, moderate to severe tricuspid valve regurgitation.  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  There was moderate-severe mitral valve regurgitation and moderate tricuspid valve regurgitation.  As patient had developed a small apical thrombus well on Eliquis , she was transitioned to warfarin.  Echocardiogram in 03/2023 showed EF 60 to 65%, no wall motion abnormalities, severe LVH of the apical segment, normal RV systolic function, severely elevated PA systolic pressure, moderate MR, severe TR, mild AS.  In the past, patient had been on amiodarone .  In 09/2023, she developed transaminitis and amiodarone  was discontinued.  She was seen by EP on 11/20/2023 and was back in atrial fibrillation.  She was started on Multaq .  When seen again by EP on 12/01/2023, she was in atrial flutter.  Attempted to pace her back to normal sinus rhythm but was unable to.  There were no good antiarrhythmic drug options for her and it was unlikely she would be able to maintain normal sinus rhythm.  There was some consideration of trial of dofetilide, though this was not felt to be a good option.  Started on metoprolol .  Patient was seen in the ED on 12/11/2023 for increased shortness of breath, weight gain, facial swelling.  Pro BNP was 1372.  She was given a dose of IV Lasix  and was discharged from the ED.  Presented to the ED again on 11/27 with similar symptoms.  Reported 10 pound weight gain.  She was admitted from 11/27 - 11/29 for CHF exacerbation.  Echocardiogram during that admission showed EF 40-45%, severe asymmetric LVH, mildly reduced RV systolic function, moderately elevated PA systolic pressure, moderate mitral regurgitation, severe tricuspid valve regurgitation.  Treated with IV Lasix .  She was discharged on 20 mg Lasix  daily.  Patient was seen in clinic on 12/26/23.  Reported that she ongoing shortness of breath and edema.  Reported 15 pound weight gain since her last appointment.  Patient was instructed to go to the ED with plans for IV diuresis,  DCCV after diuresis. In the ED, BNP 763. Creatinine stable at 1.12, Na 133. CXR showed no active disease   On interview, patient tells me that despite her ED visit and admission for CHF, she has continued to have lower extremity swelling. Also notes abdominal distention and early satiety. Has some shortness of breath on exertion bus is comfortable at rest. No orthopnea. She denies chest pain, palpitations. Denies dizziness, syncope, near syncope.   Past Medical History:  Diagnosis Date   Anemia    Atrial fibrillation (HCC)    CHF (congestive heart failure) (HCC)    DVT (deep venous thrombosis) (HCC)    in her heart   Hyperlipidemia    Hypertension    Hypothyroidism    Irregular heart rate    Thrombus in heart chamber 04/11/2022    Past Surgical History:  Procedure Laterality Date   APPENDECTOMY     in early 1970's   PACEMAKER IMPLANT N/A 07/17/2020   Procedure: PACEMAKER IMPLANT;  Surgeon: Waddell Danelle ORN, MD;  Location: [facility];  Service: Cardiovascular;  Laterality: N/A;   salvia gland  2012   Dr Carlie   VAGINAL HYSTERECTOMY     partial     Scheduled Meds:  warfarin  4 mg Oral ONCE-1600   Warfarin - Pharmacist Dosing Inpatient   Does not apply q1600   Continuous Infusions:  PRN Meds:   Allergies:    Allergies  Allergen Reactions   Eliquis  [Apixaban ] Other (See Comments)    Doesn't Work for her. Developed a clot while taking it.   Wound Dressing Adhesive Rash    Social History:   Social History   Socioeconomic History   Marital status: Married    Spouse name: Beryl   Number of children: 1   Years of education: Not on file   Highest education level: Not on file  Occupational History   Not on file  Tobacco Use   Smoking status: Never   Smokeless tobacco: Never  Vaping Use   Vaping status: Never Used  Substance and Sexual Activity   Alcohol use: Never    Alcohol/week: 1.0 standard drink of alcohol    Types: 1 Glasses of wine per week   Drug  use: Never   Sexual activity: Not Currently    Partners: Male    Birth control/protection: Post-menopausal    Comment: married  Other Topics Concern   Not on file  Social History Narrative   Not on file   Social Drivers of Health   Financial Resource Strain: Not on file  Food Insecurity: No Food Insecurity (12/18/2023)   Hunger Vital Sign    Worried About Running Out of Food in the Last Year: Never true    Ran Out of Food in the Last Year: Never true  Transportation Needs: No Transportation Needs (12/18/2023)   PRAPARE - Administrator, Civil Service (Medical): No    Lack of Transportation (Non-Medical): No  Physical Activity: Not on file  Stress: Not on file  Social Connections: Socially Integrated (12/18/2023)   Social Connection and Isolation Panel    Frequency of Communication with Friends and Family: More than three times a week    Frequency of Social Gatherings with Friends and Family: More than three times a week  Attends Religious Services: More than 4 times per year    Active Member of Clubs or Organizations: Yes    Attends Banker Meetings: More than 4 times per year    Marital Status: Married  Catering Manager Violence: Not At Risk (12/18/2023)   Humiliation, Afraid, Rape, and Kick questionnaire    Fear of Current or Ex-Partner: No    Emotionally Abused: No    Physically Abused: No    Sexually Abused: No    Family History:  Family History  Problem Relation Age of Onset   Other Mother        heart attack or aneursym   Cancer Father        blood cancer   Breast cancer Sister 35   Colon cancer Neg Hx    Esophageal cancer Neg Hx    Rectal cancer Neg Hx      ROS:  Please see the history of present illness.  All other ROS reviewed and negative.     Physical Exam/Data: Vitals:   12/26/23 1034 12/26/23 1400  BP: 118/86 138/83  Pulse: 74 86  Resp: 17 18  Temp: (!) 97.5 F (36.4 C) 97.6 F (36.4 C)  TempSrc:  Oral  SpO2: 100%  93%  Weight:  65.9 kg    Intake/Output Summary (Last 24 hours) at 12/26/2023 1456 Last data filed at 12/26/2023 1441 Gross per 24 hour  Intake --  Output 200 ml  Net -200 ml      12/26/2023    2:00 PM 12/26/2023    9:35 AM 12/20/2023    5:45 AM  Last 3 Weights  Weight (lbs) 145 lb 4.5 oz 145 lb 9.6 oz 130 lb 15.3 oz  Weight (kg) 65.9 kg 66.044 kg 59.4 kg     Body mass index is 26.15 kg/m.  General:  Well nourished, well developed, in no acute distress. Sitting comfortably on the exam table  HEENT: normal Neck: + JVD Vascular: Radial pulses 2+ bilaterally Cardiac:  normal S1, S2; irregular rate and rhythm. Faint systolic murmur  Lungs:  crackles in bilateral lung bases.  Abd: soft, nontender. Distended  Ext: 2+ edema in BLE  Musculoskeletal:  No deformities  Skin: warm and dry  Neuro:  CNs 2-12 intact, no focal abnormalities noted Psych:  Normal affect   EKG:  The EKG was personally reviewed and demonstrates:  atrial fibrillation with occasional v-paced beats. HR 78 BPM Telemetry:  Telemetry was personally reviewed and demonstrates:  Atrial fibrillation, HR in the 80s-90s  Relevant CV Studies: Cardiac Studies & Procedures   ______________________________________________________________________________________________   STRESS TESTS  MYOCARDIAL PERFUSION IMAGING 07/12/2016  Interpretation Summary  Nuclear stress EF: 64%.  Blood pressure demonstrated a normal response to exercise.  Horizontal ST segment depression ST segment depression was noted during stress in the III, aVF, V4, V5 and V6 leads, and returning to baseline after 1-5 minutes of recovery. Findings are nonspecific given baseline T wave inversions.  The study is normal.  This is a low risk study.  The left ventricular ejection fraction is normal (55-65%).   ECHOCARDIOGRAM  ECHOCARDIOGRAM COMPLETE 12/19/2023  Narrative ECHOCARDIOGRAM REPORT    Patient Name:   Paula [name] Date of  Exam: 12/19/2023 Medical Rec #:  [id number]              Height:       62.0 in Accession #:    [id number]  Weight:       139.8 lb Date of Birth:  01/23/1935             BSA:          1.642 m Patient Age:    88 years               BP:           100/69 mmHg Patient Gender: F                      HR:           71 bpm. Exam Location:  Inpatient  Procedure: 2D Echo, Cardiac Doppler, Color Doppler and Strain Analysis (Both Spectral and Color Flow Doppler were utilized during procedure).  Indications:    CHF  History:        Patient has prior history of Echocardiogram examinations, most recent 04/03/2023. CHF, Pacemaker; Risk Factors:Hypertension and Dyslipidemia.  Sonographer:    Philomena Daring Referring Phys: [id number] EKTA V PATEL  IMPRESSIONS   1. Left ventricular ejection fraction, by estimation, is 40 to 45%. The left ventricle has mildly decreased function. The left ventricle demonstrates global hypokinesis. There is severe asymmetric left ventricular hypertrophy of the apical segment. Diastology indeterminate due to atrial fibrillation. 2. Right ventricular systolic function is mildly reduced. The right ventricular size is normal. There is moderately elevated pulmonary artery systolic pressure. 3. Left atrial size was moderately dilated. 4. Right atrial size was severely dilated. 5. There is no evidence of cardiac tamponade. 6. The mitral valve is degenerative. Moderate mitral valve regurgitation. No evidence of mitral stenosis. 7. The tricuspid valve is degenerative. Tricuspid valve regurgitation is severe. 8. The aortic valve is tricuspid. Aortic valve regurgitation is not visualized. No aortic stenosis is present. 9. The inferior vena cava is dilated in size with <50% respiratory variability, suggesting right atrial pressure of 15 mmHg.  Comparison(s): Changes from prior study are noted. New mildly reduced LV and RV systolic function.  FINDINGS Left Ventricle: Left  ventricular ejection fraction, by estimation, is 40 to 45%. The left ventricle has mildly decreased function. The left ventricle demonstrates global hypokinesis. The left ventricular internal cavity size was normal in size. There is severe asymmetric left ventricular hypertrophy of the apical segment. Abnormal (paradoxical) septal motion, consistent with left bundle branch block. Diastology indeterminate due to atrial fibrillation.  Right Ventricle: The right ventricular size is normal. No increase in right ventricular wall thickness. Right ventricular systolic function is mildly reduced. There is moderately elevated pulmonary artery systolic pressure. The tricuspid regurgitant velocity is 3.07 m/s, and with an assumed right atrial pressure of 15 mmHg, the estimated right ventricular systolic pressure is 52.7 mmHg.  Left Atrium: Left atrial size was moderately dilated.  Right Atrium: Right atrial size was severely dilated.  Pericardium: Trivial pericardial effusion is present. There is no evidence of cardiac tamponade.  Mitral Valve: The mitral valve is degenerative in appearance. There is mild thickening of the mitral valve leaflet(s). Moderate mitral valve regurgitation. No evidence of mitral valve stenosis.  Tricuspid Valve: The tricuspid valve is degenerative in appearance. Tricuspid valve regurgitation is severe. No evidence of tricuspid stenosis.  Aortic Valve: The aortic valve is tricuspid. Aortic valve regurgitation is not visualized. No aortic stenosis is present. Aortic valve mean gradient measures 5.0 mmHg. Aortic valve peak gradient measures 8.8 mmHg. Aortic valve area, by VTI measures 1.30 cm.  Pulmonic Valve: The pulmonic valve was normal in structure.  Pulmonic valve regurgitation is trivial. No evidence of pulmonic stenosis.  Aorta: The aortic root and ascending aorta are structurally normal, with no evidence of dilitation.  Venous: The inferior vena cava is dilated in size  with less than 50% respiratory variability, suggesting right atrial pressure of 15 mmHg.  IAS/Shunts: No atrial level shunt detected by color flow Doppler.  Additional Comments: A device lead is visualized.   LEFT VENTRICLE PLAX 2D LVIDd:         4.30 cm   Diastology LVIDs:         3.00 cm   LV e' medial:    6.31 cm/s LV PW:         0.80 cm   LV E/e' medial:  17.7 LV IVS:        1.10 cm   LV e' lateral:   8.81 cm/s LVOT diam:     2.00 cm   LV E/e' lateral: 12.7 LV SV:         37 LV SV Index:   23 LVOT Area:     3.14 cm   RIGHT VENTRICLE            IVC RV S prime:     7.40 cm/s  IVC diam: 2.70 cm TAPSE (M-mode): 1.5 cm  LEFT ATRIUM             Index        RIGHT ATRIUM           Index LA diam:        4.30 cm 2.62 cm/m   RA Area:     24.20 cm LA Vol (A2C):   69.4 ml 42.27 ml/m  RA Volume:   77.70 ml  47.33 ml/m LA Vol (A4C):   74.3 ml 45.26 ml/m LA Biplane Vol: 74.1 ml 45.14 ml/m AORTIC VALVE AV Area (Vmax):    1.66 cm AV Area (Vmean):   1.69 cm AV Area (VTI):     1.30 cm AV Vmax:           148.00 cm/s AV Vmean:          99.500 cm/s AV VTI:            0.286 m AV Peak Grad:      8.8 mmHg AV Mean Grad:      5.0 mmHg LVOT Vmax:         78.10 cm/s LVOT Vmean:        53.500 cm/s LVOT VTI:          0.118 m LVOT/AV VTI ratio: 0.41  AORTA Ao Root diam: 2.70 cm Ao Asc diam:  3.20 cm  MITRAL VALVE                TRICUSPID VALVE MV Area (PHT): 3.11 cm     TR Peak grad:   37.7 mmHg MV Decel Time: 244 msec     TR Vmax:        307.00 cm/s MV E velocity: 112.00 cm/s SHUNTS Systemic VTI:  0.12 m Systemic Diam: 2.00 cm  Paula [name] Electronically signed by Paula [name] Signature Date/Time: 12/19/2023/1:58:05 PM    Final    MONITORS  LONG TERM MONITOR (3-14 DAYS) 05/01/2020  Narrative  No significant abnormalities   Patch Wear Time:  2 days and 23 hours (2022-04-01T16:38:40-0400 to 2022-04-04T16:10:58-0400)  Patient had a min HR of 35 bpm, max HR  of 119 bpm, and avg HR of 61 bpm. Predominant underlying rhythm was  Sinus Rhythm. 2 Supraventricular Tachycardia runs occurred, the run with the fastest interval lasting 4 beats with a max rate of 119 bpm, the longest lasting 4 beats with an avg rate of 98 bpm. Isolated SVEs were rare (<1.0%), SVE Couplets were rare (<1.0%), and SVE Triplets were rare (<1.0%). Isolated VEs were rare (<1.0%), and no VE Couplets or VE Triplets were present.  No patient triggered events     CARDIAC MRI  MR CARDIAC MORPHOLOGY W WO CONTRAST 03/25/2022  Narrative CLINICAL DATA:  55F with apical HCM, PAF, tachybrady syndrome s/p PPM. Echo 02/11/22 showed moderate-severe MR, moderate-severe TR  EXAM: CARDIAC MRI  TECHNIQUE: The patient was scanned on a 1.5 Tesla Siemens magnet. A dedicated cardiac coil was used. Functional imaging was done using Fiesta sequences. 2,3, and 4 chamber views were done to assess for RWMA's. Modified Simpson's rule using a short axis stack was used to calculate an ejection fraction on a dedicated work [employer]. The patient received 6 cc of Gadavist . After 10 minutes inversion recovery sequences were used to assess for infiltration and scar tissue. Phase contrast velocity mapping was performed above the aortic and pulmonic valves  CONTRAST:  6 cc  of Gadavist   FINDINGS: Left ventricle:  -Apical hypertrophy consistent with apical HCM  -Apical aneurysm measuring 21mm in diameter  -Normal size  -Normal systolic function  -Patchy LGE at apex accounting for <1% total myocardial mass  -Small apical thrombus  LV EF:  60% (Normal 52-79%)  Absolute volumes:  LV EDV: (Normal 78-167 mL)  LV ESV: 55mL (Normal 21-64 mL)  LV SV: 82mL (Normal 52-114 mL)  CO: 6.6L/min (Normal 2.7-6.3 L/min)  Indexed volumes:  LV EDV: 3mL/sq-m (Normal 50-96 mL/sq-m)  LV ESV: 1mL/sq-m (Normal 10-40 mL/sq-m)  LV SV: 7mL/sq-m (Normal 33-64 mL/sq-m)  CI:  4.0L/min/sq-m (Normal 1.9-3.9 L/min/sq-m)  Right ventricle: Normal size and systolic function  RV EF: 53% (Normal 52-80%)  Absolute volumes:  RV EDV: (Normal 79-175 mL)  RV ESV: 68mL (Normal 13-75 mL)  RV SV: 78mL (Normal 56-110 mL)  CO: 6.2L/min (Normal 2.7-6 L/min)  Indexed volumes:  RV EDV: 45mL/sq-m (Normal 51-97 mL/sq-m)  RV ESV: 74mL/sq-m (Normal 9-42 mL/sq-m)  RV SV: 46mL/sq-m (Normal 35-61 mL/sq-m)  CI: 3.7L/min/sq-m (Normal 1.8-3.8 L/min/sq-m)  Left atrium: Moderate enlargement  Right atrium: Mild enlargement  Mitral valve: Moderate to severe regurgitation; moderate by regurgitant volume (32cc), moderate to severe by regurgitant fraction (39%)  Aortic valve: Trivial regurgitation  Tricuspid valve: Moderate regurgitation (regurgitant volume 28cc, regurgitant fraction 36%)  Pulmonic valve: Trivial regurgitation  Aorta: Normal proximal ascending aorta  Pericardium: Small effusion  IMPRESSION: 1. LV apical hypertrophy consistent with apical hypertrophic cardiomyopathy  2.  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  Small apical thrombus  4. Patchy LGE at apex consistent with apical HCM. LGE accounts for <1% total myocardial mass  5.  Normal LV size and systolic function (EF 60%)  6.  Normal RV size and systolic function (EF 53%)  7. Moderate to severe mitral regurgitation; moderate by regurgitant volume (32cc), moderate to severe by regurgitant fraction (39%)  8. Moderate tricuspid regurgitation (regurgitant volume 28cc, regurgitant fraction 36%)   Electronically Signed By: Paula [name] M.D. On: 04/03/2022 21:29   ______________________________________________________________________________________________       Laboratory Data: High Sensitivity Troponin:  No results for input(s): TROPONINIHS in the last 720 hours.   Chemistry Recent Labs  Lab 12/20/23 0825 12/20/23 1405 12/26/23 1109  NA 128*  129* 133*   K 2.9* 4.5 4.2  CL 91* 96* 98  CO2 25 23 25   GLUCOSE 107* 98 98  BUN 11 14 15   CREATININE 1.01* 0.98 1.12*  CALCIUM  7.9* 8.1* 8.4*  MG  --   --  2.1  GFRNONAA 54* 56* 48*  ANIONGAP 12 10 10     No results for input(s): PROT, ALBUMIN, AST, ALT, ALKPHOS, BILITOT in the last 168 hours. Lipids No results for input(s): CHOL, TRIG, HDL, LABVLDL, LDLCALC, CHOLHDL in the last 168 hours.  Hematology Recent Labs  Lab 12/20/23 0159 12/26/23 1109  WBC 6.4 6.1  RBC 3.72* 3.87  HGB 10.8* 11.2*  HCT 31.9* 34.4*  MCV 85.8 88.9  MCH 29.0 28.9  MCHC 33.9 32.6  RDW 13.3 14.2  PLT 260 296   Thyroid   Recent Labs  Lab 12/26/23 1109  TSH 13.882*    BNP Recent Labs  Lab 12/26/23 1109  BNP 763.0*    DDimer No results for input(s): DDIMER in the last 168 hours.  Radiology/Studies:  DG Chest 2 View Result Date: 12/26/2023 CLINICAL DATA:  Chest pain EXAM: CHEST - 2 VIEW COMPARISON:  Prior chest x-ray 12/18/2023 FINDINGS: Left subclavian approach cardiac rhythm maintenance device remains in unchanged position. Mild cardiomegaly, stable. Atherosclerotic calcifications in the transverse aorta. Diffuse mild chronic bronchitic changes. No focal airspace infiltrate, pleural effusion, pulmonary edema or pneumothorax. Right basilar pleuroparenchymal scarring. No acute osseous abnormality. IMPRESSION: No active cardiopulmonary disease. Electronically Signed   By: Wilkie Lent M.D.   On: 12/26/2023 11:53     Assessment and Plan:  Acute on Chronic HFrEF  Suspected nonischemic cardiomyopathy  Apical Variant HCM  - Echocardiogram from 11/28 showed EF 40-45%, severe asymmetric left ventricular hypertrophy of the apical segment, mildly reduced RV systolic function, moderate MR, severe TR - Patient was seen in the ED on 11/20 with CHF exacerbation. Given a dose of IV lasix  and discharged. Admitted 11/27-11/29 with CHF. Appears to only have mildly diuresed.  - Seen in clinic  today with 15 lb weight gain, shortness of breath, edema. Sent to the ED with plans to admit for diuresis  - Patient has been back in atrial fibrillation since 10/2023. Likely this has contributed to CHF  - in the ED, BNP 763. Creatinine 1.12  - Start IV lasix  40 mg BID  - Follow strict I/Os, daily weights  - Continue metoprolol  succinate 25 mg daily  - Transition from valsartan  160 mg daily to entresto  24-26 mg BID  - If BP tolerates, consider starting spironolactone  - Recommend holding diltiazem  with reduced EF   Persistent atrial fibrillation  - Patient had previously been on amiodarone  but developed transaminitis. Transitioned to multaq  in 09/2023. Has been back in afib since 10/2023  - Suspect afib is contributing to CHF  - Per Dr. Adrian EP note 11/10- no good options for AA drugs. Could possibly try dofetilide, but may not be a good option with her HCM  - Currently in atrial fibrillation. HR well controlled  - Continue coumadin  - goal INR 2.0-3.0. INR has been therapeutic since 10/16  - Stop diltiazem   - Continue metoprolol  succinate 25 mg daily. Can increase as needed for HR control  - Continue mutlaq for now. Once diuresed, consider cardioversion this admission   Tachy-brady s/p ppm  - Followed by EP   Mitral valve regurgitation  Tricuspid valve regurgitation  - Echocardiogram from 11/2023 showed EF 40-45%, moderate MR, severe TR  - Diuresis as above  Risk Assessment/Risk Scores:   New [place]  Heart Association (NYHA) Functional Class NYHA Class III  CHA2DS2-VASc Score = 6  This indicates a 9.7% annual risk of stroke. The patient's score is based upon: CHF History: 1 HTN History: 1 Diabetes History: 0 Stroke History: 0 Vascular Disease History: 1 Age Score: 2 Gender Score: 1    For questions or updates, please contact  [facility] Please consult [email or web address] for contact info under     Signed, Rollo FABIENE Louder, PA-C  12/26/2023 2:56  PM   ATTENDING ATTESTATION:  After conducting a review of all available clinical information with the care team, interviewing the patient, and performing a physical exam, I agree with the findings and plan described in this note.   GEN: No acute distress, AO x 3 HEENT:  MMM, JVP to jaw, no scleral icterus Cardiac: Rate and rhythm, no murmurs, rubs, or gallops.  Respiratory: Clear to auscultation bilaterally. GI: Soft, nontender, non-distended  MS: Trace edema; No deformity.  Warm and well-perfused Neuro:  Nonfocal  Vasc:  +2 radial pulses  Patient is a very pleasant 88 year old female with a history of persistent atrial fibrillation on Coumadin , tachybradycardia syndrome status post permanent pacemaker, intolerance to amiodarone  due to elevated LFTs, hypertension, hypothyroidism, and apical variant hypertrophic obstructive cardiomyopathy who was sent from the outpatient clinic due to acute on chronic systolic and diastolic heart failure.  The patient was in her normal state of health up until November 20 when she developed increasing shortness of breath and peripheral edema.  She was given a dose of Lasix  and discharged from the emergency department.  Unfortunately she presented again about a week later with a 10 pound weight gain.  Her echocardiogram at that point demonstrated moderate LV dysfunction with an EF of 40 to 45%.  She was discharged on Lasix  20 mg daily only.  She was seen today in clinic endorsing ongoing shortness of breath, peripheral edema, and early satiety.  She reported 15 pound weight gain.  For this reason she was instructed to proceed to the emergency department.  In the emergency department her BNP was elevated to 763.  Her creatinine is mildly elevated to 1.12.  Her TSH was 13.8.  INR is therapeutic at 2.1.  Chest x-ray shows no acute cardiopulmonary disease.  EKG demonstrates atrial fibrillation and occasional V pacing.  Acute on chronic systolic and diastolic heart  failure Start Lasix  40 mg IV twice daily Continue Toprol  25 mg daily Discontinue valsartan  and start Entresto  24 x 26 twice daily with uptitration here in the hospital If creatinine okay tomorrow, start spironolactone BP fairly well-controlled Persistent atrial fibrillation Unable to tolerate amiodarone  Given structural heart disease not a good candidate for dofetilide Currently on Multaq  will continue for now. Continue Toprol  25 mg After intensive diuresis we will pursue cardioversion this admission Continue Coumadin ; INR have been therapeutic since 10/16 Tachybradycardia syndrome Status post permanent pacemaker Pacemaker interrogation demonstrating 13% AF burden Unable to overdrive pace to normal sinus rhythm EP visit November 10 Valvular heart disease Moderate MR Severe TR Some R-sided symptoms Continue therapy as above Advanced age, would defer T-TEER.    Lurena Red, MD Pager [phone]

## 2023-12-26 NOTE — ED Provider Triage Note (Signed)
 Emergency Medicine Provider Triage Evaluation Note  Paula [name] , a 88 y.o. female  was evaluated in triage.  Pt complains of increasing swelling.  Concerns for worsening CHF exacerbation..  Review of Systems  Positive: SOB, swelling  Negative: CP, N/v/d.   Physical Exam  BP 118/86 (BP Location: Left Arm)   Pulse 74   Temp (!) 97.5 F (36.4 C)   Resp 17   SpO2 100%  Gen:   Awake, no distress   Resp:  Normal effort  MSK:   Moves extremities without difficulty  Other:    Medical Decision Making  Medically screening exam initiated at 10:51 AM.  Appropriate orders placed.  Paula [name] was informed that the remainder of the evaluation will be completed by another provider, this initial triage assessment does not replace that evaluation, and the importance of remaining in the ED until their evaluation is complete.  Will obtain basic laboratory screening workup.  BnP.  X-ray.  No chest pain. No  Concerns for ACS component.   Paula Lavonia SAILOR, MD 12/26/23 1051

## 2023-12-26 NOTE — Assessment & Plan Note (Addendum)
 Continue with levothyroxine

## 2023-12-26 NOTE — Hospital Course (Addendum)
 Paula [name] was admitted to the hospital with the working diagnosis of heart failure exacerbation.   88 year old female with history of atrial fibrillation, sp pacemaker, hypertrophic cardiomyopathy, chronic hyponatremia, hypothyroidism who presented with dyspnea.  Recent hospitalization,11/27 to 12/20/23, for heart failure exacerbation, she was diuresed and discharged on 20 mg furosemide  to take daily.  At home she had recurrent dyspnea and worsening edema. On the day of admission she was evaluated at the outpatient cardiology clinic, she was found hypervolemic and was referred to the ED.  On her initial physical examination her blood pressure was 118/86, HR 74, RR 17 and 02 saturation 100%  Lungs with no wheezing or rhonchi, heart with S1 and S2 present and regular, positive JVD, abdomen with no distention and positive lower extremity edema.   Na 133, K 4.2 Cl 98 bicarbonate 25 glucose 98 bun 15 cr 1,12 Mg 2,1  BNP 763  Wbc 6,1 hgb 11.2 plt 296  TSH 13,8 free T4 1,11  INR 2.1   Chest radiograph with positive cardiomegaly, bilateral hilar vascular congestion, and very small bilateral pleural effusions.   EKG 76 bpm, right axis deviation, qtc 459, atrial fibrillation with ventricular pacing, no significant ST segment changes, negative T wave lead V3 to V6.   Patient was placed on IV furosemide  for diuresis.    12/08 direct current cardioversion 200, converting to sinus rhythm.  12/11 placed on dofetilide  125 mg bid, but resulted in significant prolongation of qtc  12/12 decision was made to transition to amiodarone  and follow up as outpatient.

## 2023-12-26 NOTE — Assessment & Plan Note (Deleted)
 12/26/23 chronic.

## 2023-12-26 NOTE — Assessment & Plan Note (Signed)
 12/26/23 chronic.  Has pacemaker.  Followed by EP/cardiology.

## 2023-12-26 NOTE — TOC CM/SW Note (Signed)
 TOC consult received for d/c planning needs, possible SNF vs. HH. Follow-up to be completed with patient as appropriate.   Merilee Batty, MSN, RN Case Management [phone]

## 2023-12-26 NOTE — ED Provider Notes (Signed)
 [place] EMERGENCY DEPARTMENT AT [facility] Provider Note   CSN: [id number] Arrival date & time: 12/26/23  1031     Patient presents with: Shortness of Breath and A-Fib   Eira Alpert is a 88 y.o. female.    Shortness of Breath  Patient presents increasing swelling bilateral lower extremities.  Feels like she just is getting a lot more water weight compared to baseline.  No chest pain.  Does endorse some shortness of breath especially with exertion.  Patient states she will be brushing her teeth and get short of breath.  No exertional chest pain.  No nausea vomit diarrhea.  Still producing urine.  Is been taking her Lasix  as prescribed after last admission.  Been taking warfarin as well.  No sick contacts.  Previous medical history reviewed : Patient saw her cardiologist today.  Cardiology found her to be 15 pounds up from her last clinic visit.  Volume overload exam.  Last EF 40-45%.  Patient was sent here for admission for IV diuresis.  Recommend diuresis and once euvolemic attempt cardioversion.     Prior to Admission medications   Medication Sig Start Date End Date Taking? Authorizing Provider  acetaminophen  (TYLENOL ) 325 MG tablet Take 650 mg by mouth every 4 (four) hours as needed for moderate pain (pain score 4-6) or headache.    [provider]  Calcium  Carb-Cholecalciferol 600-5 MG-MCG TABS Take 1 tablet by mouth daily at 6 (six) AM.    [provider]  diltiazem  (CARDIZEM  CD) 300 MG 24 hr capsule TAKE 1 CAPSULE EVERY DAY 04/29/23   Kate Lonni CROME, MD  dronedarone  (MULTAQ ) 400 MG tablet Take 1 tablet (400 mg total) by mouth 2 (two) times daily with a meal. 10/24/23   Waddell Danelle ORN, MD  famotidine  (PEPCID  AC) 10 MG tablet Take 10 mg by mouth at bedtime.    [provider]  furosemide  (LASIX ) 20 MG tablet Take 1 tablet (20 mg total) by mouth daily. 12/20/23   Roann Gouty, MD  levothyroxine  (SYNTHROID ) 125 MCG tablet  Take 125 mcg by mouth daily before breakfast.    [provider]  linaclotide  (LINZESS ) 290 MCG CAPS capsule Take 1 capsule (290 mcg total) by mouth daily before breakfast. 03/21/22   Odell Celinda Balo, MD  metoprolol  succinate (TOPROL  XL) 25 MG 24 hr tablet Take 1 tablet (25 mg total) by mouth daily. At night after dinner 12/01/23   Waddell Danelle ORN, MD  Multiple Vitamin (MULTIVITAMIN WITH MINERALS) TABS tablet Take 1 tablet by mouth daily.    [provider]  polyethylene glycol (MIRALAX  / GLYCOLAX ) 17 g packet Take 17 g by mouth at bedtime.    [provider]  prazosin  (MINIPRESS ) 1 MG capsule TAKE 1 CAPSULE AT BEDTIME Patient not taking: Reported on 12/26/2023 05/13/23   Kate Lonni CROME, MD  sodium chloride  (OCEAN) 0.65 % SOLN nasal spray Place 1 spray into both nostrils as needed for congestion.    [provider]  triamcinolone cream (KENALOG) 0.1 % Apply 1 Application topically 2 (two) times daily as needed (itching). 12/04/23   [provider]  valsartan  (DIOVAN ) 160 MG tablet Take 1 tablet (160 mg total) by mouth daily. Patient taking differently: Take 160 mg by mouth every evening. 12/01/23   Waddell Danelle ORN, MD  warfarin (COUMADIN ) 4 MG tablet TAKE 1 TABLET BY MOUTH ONCE DAILY OR AS DIRECTED BY ANTICOAGULATION CLINIC Patient taking differently: Take 4 mg by mouth daily at 4  PM. Take 1 tablet by mouth once daily or as directed by anticoagulation clinic 11/04/23   Kate Lonni CROME, MD    Allergies: Eliquis  [apixaban ] and Wound dressing adhesive    Review of Systems  Respiratory:  Positive for shortness of breath.     Updated Vital Signs BP 138/83   Pulse 86   Temp 97.6 F (36.4 C) (Oral)   Resp 18   Wt 65.9 kg   SpO2 93%   BMI 26.15 kg/m   Physical Exam Vitals and nursing note reviewed.  Constitutional:      General: She is not in acute distress.    Appearance: She is well-developed.  HENT:     Head:  Normocephalic and atraumatic.  Eyes:     Conjunctiva/sclera: Conjunctivae normal.  Cardiovascular:     Rate and Rhythm: Normal rate and regular rhythm.     Heart sounds: No murmur heard. Pulmonary:     Effort: Pulmonary effort is normal. No respiratory distress.     Breath sounds: Normal breath sounds.  Abdominal:     Palpations: Abdomen is soft.     Tenderness: There is no abdominal tenderness.  Musculoskeletal:        General: No swelling.     Cervical back: Neck supple.  Skin:    General: Skin is warm and dry.     Capillary Refill: Capillary refill takes less than 2 seconds.  Neurological:     Mental Status: She is alert.  Psychiatric:        Mood and Affect: Mood normal.     (all labs ordered are listed, but only abnormal results are displayed) Labs Reviewed  BASIC METABOLIC PANEL WITH GFR - Abnormal; Notable for the following components:      Result Value   Sodium 133 (*)    Creatinine, Ser 1.12 (*)    Calcium  8.4 (*)    GFR, Estimated 48 (*)    All other components within normal limits  CBC - Abnormal; Notable for the following components:   Hemoglobin 11.2 (*)    HCT 34.4 (*)    All other components within normal limits  PROTIME-INR - Abnormal; Notable for the following components:   Prothrombin Time 24.5 (*)    INR 2.1 (*)    All other components within normal limits  BRAIN NATRIURETIC PEPTIDE - Abnormal; Notable for the following components:   B Natriuretic Peptide 763.0 (*)    All other components within normal limits  TSH - Abnormal; Notable for the following components:   TSH 13.882 (*)    All other components within normal limits  MAGNESIUM     EKG: EKG Interpretation Date/Time:  Friday December 26 2023 10:39:49 EST Ventricular Rate:  78 PR Interval:    QRS Duration:  88 QT Interval:  420 QTC Calculation: 478 R Axis:   -4  Text Interpretation: Atrial fibrillation with frequent ventricular-paced complexes Possible Anterior infarct , age  undetermined Abnormal ECG When compared with ECG of 26-Dec-2023 10:09, PREVIOUS ECG IS PRESENT Confirmed by Simon Rea [phone]) on 12/26/2023 10:46:31 AM  Radiology: [facility] Chest 2 View Result Date: 12/26/2023 CLINICAL DATA:  Chest pain EXAM: CHEST - 2 VIEW COMPARISON:  Prior chest x-ray 12/18/2023 FINDINGS: Left subclavian approach cardiac rhythm maintenance device remains in unchanged position. Mild cardiomegaly, stable. Atherosclerotic calcifications in the transverse aorta. Diffuse mild chronic bronchitic changes. No focal airspace infiltrate, pleural effusion, pulmonary edema or pneumothorax. Right basilar pleuroparenchymal scarring. No acute osseous abnormality. IMPRESSION: No active cardiopulmonary  disease. Electronically Signed   By: Wilkie Lent M.D.   On: 12/26/2023 11:53     Procedures   Medications Ordered in the ED  warfarin (COUMADIN ) tablet 4 mg (has no administration in time range)  Warfarin - Pharmacist Dosing Inpatient (has no administration in time range)  furosemide  (LASIX ) injection 40 mg (has no administration in time range)  sacubitril -valsartan  (ENTRESTO ) 24-26 mg per tablet (has no administration in time range)  metoprolol  succinate (TOPROL -XL) 24 hr tablet 25 mg (has no administration in time range)  furosemide  (LASIX ) injection 40 mg (40 mg Intravenous Given 12/26/23 1429)                                    Medical Decision Making Amount and/or Complexity of Data Reviewed Labs: ordered. Radiology: ordered.  Risk Prescription drug management. Decision regarding hospitalization.     HPI:  Patient presents increasing swelling bilateral lower extremities.  Feels like she just is getting a lot more water weight compared to baseline.  No chest pain.  Does endorse some shortness of breath especially with exertion.  Patient states she will be brushing her teeth and get short of breath.  No exertional chest pain.  No nausea vomit diarrhea.  Still producing  urine.  Is been taking her Lasix  as prescribed after last admission.  Been taking warfarin as well.  No sick contacts.  Previous medical history reviewed : Patient saw her cardiologist today.  Cardiology found her to be 15 pounds up from her last clinic visit.  Volume overload exam.  Last EF 40-45%.  Patient was sent here for admission for IV diuresis.  Recommend diuresis and once euvolemic attempt cardioversion  MDM:   On exam, patient ANO x 3 GCS 15.  Hemodynamically stable.  Afebrile.  Pulse ox 100% on room air.  Patient does have swelling of the lower extremities.  Cannot appreciate any obvious rales or rhonchi at this time.  Obtain laboratory workup.  Assessment, large electrolyte derangements.  Assess for AKI.  BNP for further characterization of patient's hypervolemia.  Chest x-ray to rule out infiltrate or pleural effusions or pulmonary edema.  Really no concerns any kind of ACS pathology.  Obtain EKG but no indication obtain troponins given patient is asymptomatic at this time.    Reevaluation:   Upon reexamination, patient hemodynamically stable.  Remains A&O x 3 with GCS 15.   No AKI.  No large electrolyte derangements.  BMP elevated to 763.  Started patient on 40 mg IV Lasix .  Patient admitted to medicine with cardiology following.  Interventions: 40 mg IV lasix    EKG Interpreted by Me: a fib    Cardiac Tele Interpreted by Me: a fib    I have independently interpreted the CXR    Social Determinant of Health: Lives at home with husband     Disposition and Follow Up: admit       Final diagnoses:  SOB (shortness of breath)  Acute on chronic congestive heart failure, unspecified heart failure type [facility])    ED Discharge Orders     None          [name], MD 12/26/23 [id number]

## 2023-12-26 NOTE — Assessment & Plan Note (Deleted)
 12/26/23 admit to cardiac telemetry bed.  Continue with IV diuresis with Lasix  40 mg daily.  Given right sided heart failure, will need to be cautious about overdiuresis.  Hold her other blood pressure medications including Diovan  and Minipress  and Cardizem .  Keep potassium level greater than 4 and magnesium  level greater than 2. cardiology will be consulted.

## 2023-12-26 NOTE — H&P (Addendum)
 History and Physical    Paula [name] FMW:[id number] DOB: August 03, 1935 DOA: 12/26/2023  DOS: the patient was seen and examined on 12/26/2023  PCP: Charlott Dorn LABOR, MD   Patient coming from: Clinic(cardiology)  I have personally briefly reviewed patient's old medical records in [facility]  CC: SOB, sent from cardiology office HPI: 88 year old female with a history of atrial fibrillation (on Multaq ), prior history of pacemaker, history of hypertrophic cardiomyopathy, prior history of cardiac thrombus while taking Eliquis  now on chronic Coumadin , chronic hyponatremia, hypothyroidism who presents to the ER from cardiology clinic.  Patient had been admitted to the hospital from December 18, 2023.  She was discharged on December 20, 2023.  Her echocardiogram did show new onset right sided heart failure.  Her LV EF had decreased to 40 to 45%.  Her prior echo in March 2025 had shown a LVEF of 60 to 65%.  And also normal RV systolic function.  Patient has had persistent dyspnea on exertion since discharge.  She was seen in cardiology clinic today.  Her weights show that she is about 10 pounds heavier than her prior November 2025 cardiology appointment.  She was sent directly to the ER from the cardiology office. I personally spoke with her cardiologist Dr. Kate.  He feels that the patient is having acute right-sided heart failure due to lower extremity edema and no pulmonary edema.  He wants the patient diuresed.  He is planning on cardioversion next week.  Patient is on Coumadin .  She has not missed any doses.  Her INR is therapeutic at 2.1.  Patient states that her PCP has had trouble managing her thyroid  levels.  Patient is on Multaq .  Last month, her free T4 was normal at 1.11, and her TSH was elevated at 15.8.  Her chest x-ray today was clear of any pulmonary edema.  EKG shows rate controlled A-fib.  [facility] hospital[facility] consulted for admission.  Cardiology was  consulted.  Significant Events: Admitted 12/26/2023 for acute right sided CHF   Admission Labs: WBC 6.1, HgB 11.2, plt 296 INR 2.1 Mg 2.1 Na 133, K 4.2, CO2 of 25, BUN 15, scr 1.12, glu 98 TSH 13.882 BNP 763  Admission Imaging Studies: CXR No active cardiopulmonary disease.   Significant Labs:   Significant Imaging Studies:   Antibiotic Therapy: Anti-infectives (From admission, onward)    None       Procedures:   Consultants: cardiology    ED Course: CXR negative for pulmonary edema. BNP elevated. Discussed with cards(Dr. Kate) who wants pt to be admitted to inpatient bed, not [facility].  Review of Systems:  Review of Systems  Constitutional:  Positive for malaise/fatigue.  HENT: Negative.    Eyes: Negative.   Respiratory:  Positive for shortness of breath.        SOB with exertion  Cardiovascular:  Negative for chest pain.       Abd/flank edema. Dyspnea on exertion. No chest pain  Gastrointestinal: Negative.   Genitourinary: Negative.   Musculoskeletal: Negative.   Skin: Negative.   Neurological: Negative.   Endo/Heme/Allergies: Negative.   Psychiatric/Behavioral: Negative.    All other systems reviewed and are negative.   Past Medical History:  Diagnosis Date   Anemia    Atrial fibrillation (HCC)    CHF (congestive heart failure) (HCC)    DVT (deep venous thrombosis) (HCC)    in her heart   Hyperlipidemia    Hypertension    Hypothyroidism    Irregular heart  rate    Thrombus in heart chamber 04/11/2022    Past Surgical History:  Procedure Laterality Date   APPENDECTOMY     in early 1970's   PACEMAKER IMPLANT N/A 07/17/2020   Procedure: PACEMAKER IMPLANT;  Surgeon: Waddell Danelle ORN, MD;  Location[facility] INVASIVE CV LAB;  Service: Cardiovascular;  Laterality: N/A;   salvia gland  2012   Dr Carlie   VAGINAL HYSTERECTOMY     partial     reports that she has never smoked. She has never used smokeless tobacco. She reports that she  does not drink alcohol and does not use drugs.  Allergies  Allergen Reactions   Eliquis  [Apixaban ] Other (See Comments)    Doesn't Work for her. Developed a clot while taking it.   Wound Dressing Adhesive Rash    Family History  Problem Relation Age of Onset   Other Mother        heart attack or aneursym   Cancer Father        blood cancer   Breast cancer Sister 76   Colon cancer Neg Hx    Esophageal cancer Neg Hx    Rectal cancer Neg Hx     Prior to Admission medications   Medication Sig Start Date End Date Taking? Authorizing Provider  acetaminophen  (TYLENOL ) 325 MG tablet Take 650 mg by mouth every 4 (four) hours as needed for moderate pain (pain score 4-6) or headache.    [provider]  Calcium  Carb-Cholecalciferol 600-5 MG-MCG TABS Take 1 tablet by mouth daily at 6 (six) AM.    [provider]  diltiazem  (CARDIZEM  CD) 300 MG 24 hr capsule TAKE 1 CAPSULE EVERY DAY 04/29/23   Kate Lonni CROME, MD  dronedarone  (MULTAQ ) 400 MG tablet Take 1 tablet (400 mg total) by mouth 2 (two) times daily with a meal. 10/24/23   Waddell Danelle ORN, MD  famotidine  (PEPCID  AC) 10 MG tablet Take 10 mg by mouth at bedtime.    [provider]  furosemide  (LASIX ) 20 MG tablet Take 1 tablet (20 mg total) by mouth daily. 12/20/23   Roann Gouty, MD  levothyroxine  (SYNTHROID ) 125 MCG tablet Take 125 mcg by mouth daily before breakfast.    [provider]  linaclotide  (LINZESS ) 290 MCG CAPS capsule Take 1 capsule (290 mcg total) by mouth daily before breakfast. 03/21/22   Odell Celinda Balo, MD  metoprolol  succinate (TOPROL  XL) 25 MG 24 hr tablet Take 1 tablet (25 mg total) by mouth daily. At night after dinner 12/01/23   Waddell Danelle ORN, MD  Multiple Vitamin (MULTIVITAMIN WITH MINERALS) TABS tablet Take 1 tablet by mouth daily.    [provider]  polyethylene glycol (MIRALAX  / GLYCOLAX ) 17 g packet Take 17 g by mouth at bedtime.    [provider]  prazosin  (MINIPRESS ) 1 MG capsule TAKE 1 CAPSULE AT BEDTIME Patient not taking: Reported on 12/26/2023 05/13/23   Kate Lonni CROME, MD  sodium chloride  (OCEAN) 0.65 % SOLN nasal spray Place 1 spray into both nostrils as needed for congestion.    [provider]  triamcinolone cream (KENALOG) 0.1 % Apply 1 Application topically 2 (two) times daily as needed (itching). 12/04/23   [provider]  valsartan  (DIOVAN ) 160 MG tablet Take 1 tablet (160 mg total) by mouth daily. Patient taking differently: Take 160 mg by mouth every evening. 12/01/23   Waddell Danelle ORN, MD  warfarin (COUMADIN ) 4 MG tablet TAKE 1 TABLET BY MOUTH ONCE DAILY  OR AS DIRECTED BY ANTICOAGULATION CLINIC Patient taking differently: Take 4 mg by mouth daily at 4 PM. Take 1 tablet by mouth once daily or as directed by anticoagulation clinic 11/04/23   Kate Lonni CROME, MD    Physical Exam: Vitals:   12/26/23 1034  BP: 118/86  Pulse: 74  Resp: 17  Temp: (!) 97.5 F (36.4 C)  SpO2: 100%    Physical Exam Vitals and nursing note reviewed.  Constitutional:      General: She is not in acute distress.    Appearance: She is not toxic-appearing or diaphoretic.     Comments: Appears younger than stated age of 86  HENT:     Head: Normocephalic and atraumatic.  Eyes:     General: No scleral icterus. Cardiovascular:     Rate and Rhythm: Normal rate. Rhythm irregular.     Comments: JVD to angle of mandible Pulmonary:     Effort: Pulmonary effort is normal.     Breath sounds: Normal breath sounds.  Abdominal:     General: Bowel sounds are normal.     Palpations: Abdomen is soft.     Tenderness: There is no abdominal tenderness.  Skin:    General: Skin is warm and dry.     Capillary Refill: Capillary refill takes less than 2 seconds.     Comments: Trace flank edema bilaterally. Minimal thigh/lower leg edema  Neurological:     Mental Status: She is alert and oriented to person, place, and  time.      Labs on Admission: I have personally reviewed following labs and imaging studies  CBC: Recent Labs  Lab 12/20/23 0159 12/26/23 1109  WBC 6.4 6.1  HGB 10.8* 11.2*  HCT 31.9* 34.4*  MCV 85.8 88.9  PLT 260 296   Basic Metabolic Panel: Recent Labs  Lab 12/20/23 0825 12/20/23 1405 12/26/23 1109  NA 128* 129* 133*  K 2.9* 4.5 4.2  CL 91* 96* 98  CO2 25 23 25   GLUCOSE 107* 98 98  BUN 11 14 15   CREATININE 1.01* 0.98 1.12*  CALCIUM  7.9* 8.1* 8.4*  MG  --   --  2.1   GFR: Estimated Creatinine Clearance: 32.6 mL/min (A) (by C-G formula based on SCr of 1.12 mg/dL (H)). Coagulation Profile: Recent Labs  Lab 12/20/23 0159 12/26/23 1109  INR 2.3* 2.1*   ProBNP, BNP (last 5 results) Recent Labs    12/11/23 1349 12/18/23 1451 12/26/23 1109  PROBNP 1,372.0* 2,070.0*  --   BNP  --   --  763.0*   Thyroid  Function Tests: Recent Labs    12/26/23 1109  TSH 13.882*   Radiological Exams on Admission: I have personally reviewed images DG Chest 2 View Result Date: 12/26/2023 CLINICAL DATA:  Chest pain EXAM: CHEST - 2 VIEW COMPARISON:  Prior chest x-ray 12/18/2023 FINDINGS: Left subclavian approach cardiac rhythm maintenance device remains in unchanged position. Mild cardiomegaly, stable. Atherosclerotic calcifications in the transverse aorta. Diffuse mild chronic bronchitic changes. No focal airspace infiltrate, pleural effusion, pulmonary edema or pneumothorax. Right basilar pleuroparenchymal scarring. No acute osseous abnormality. IMPRESSION: No active cardiopulmonary disease. Electronically Signed   By: Wilkie Lent M.D.   On: 12/26/2023 11:53    EKG: My personal interpretation of EKG shows: afib with occ paced rhythm      Assessment/Plan Principal Problem:   Acute right-sided CHF (congestive heart failure) (HCC) Active Problems:   Persistent atrial fibrillation (HCC)   Hyponatremia   Essential hypertension   Pacemaker  Apical variant hypertrophic  cardiomyopathy (HCC)   Hypothyroidism   Chronic anticoagulation - on coumadin  for afib and prior hx of mural thrombus(while taking Eliquis )    Assessment and Plan: * Acute right-sided CHF (congestive heart failure) (HCC) 12/26/23 admit to cardiac telemetry bed.  Continue with IV diuresis with Lasix  40 mg daily.  Given right sided heart failure, will need to be cautious about overdiuresis.  Hold her other blood pressure medications including Diovan  and Minipress  and Cardizem .  Keep potassium level greater than 4 and magnesium  level greater than 2. cardiology will be consulted.   Hyponatremia 12/26/23 chronic hyponatremia.  Monitor serum sodium during diuresis.   Persistent atrial fibrillation (HCC) 12/26/23 continue with Multaq  4 mg twice daily.  Hold her Toprol -XL and Cardizem  for now.  Patient states that she has had syncope in the past due to low blood pressures.   Chronic anticoagulation - on coumadin  for afib and prior hx of mural thrombus(while taking Eliquis ) 12/26/23 continue with Coumadin  4 mg a day.  Consult pharmacy for INR monitoring.   Hypothyroidism 12/26/23 free T4 was normal last month.  She has an elevated TSH.  She is on Multaq .  Continue with Synthroid  125 mcg daily.  Apical variant hypertrophic cardiomyopathy (HCC) 12/26/23 chronic.  Has pacemaker.  Followed by EP/cardiology.   Pacemaker 12/26/23 chronic.   Essential hypertension 12/26/23 will hold her other hypertension medications for now during her diuresis.  Patient has had syncope due to low blood pressures in the past.   DVT prophylaxis: Coumadin  Code Status: Full Code Family Communication: discussed with pt and husband Paula [name] at bedside  Disposition Plan: home  Consults called: cardiology, discussed with her personal cardiology Dr. Kate. Sent secure chat to cards APP Avelina Bonds. Admission status: Inpatient, Telemetry bed   Camellia Door, DO [facility] 12/26/2023, 2:27 PM

## 2023-12-26 NOTE — Assessment & Plan Note (Deleted)
 12/26/23 continue with Coumadin  4 mg a day.  Consult pharmacy for INR monitoring.

## 2023-12-26 NOTE — Assessment & Plan Note (Addendum)
 Tachybradycardia syndrome sp pacemaker.   12/11 EKG 61 bpm, left axis deviation, qtc 501, atrial pacing and ventricular sensing, with no significant ST segment changes, negative T wave lead II, III, aVF, V1 to V6.   Continue with dofetilide  for rhythm control.  AV blockade with metoprolol  succinate 25 mg  Continue anticoagulation with warfarin INR today 2.2 Continue telemetry monitoring

## 2023-12-26 NOTE — ED Triage Notes (Signed)
 Pt came in via POV d/t feeling SOB d/t her cardiologist saying she needs to get some more fluid off of her d/t her A-Fib (per pt). A/Ox4, denies any acute pain, just feeling fatigued.

## 2023-12-26 NOTE — Assessment & Plan Note (Addendum)
 AKI Hyponatremia   Renal function today with serum cr at 0,89 with K at 4,4 and serum bicarbonate at 27  Na 131 and Mg 2.7  Continue close monitoring renal function and electrolytes

## 2023-12-26 NOTE — Progress Notes (Signed)
 PHARMACY - ANTICOAGULATION CONSULT NOTE  Pharmacy Consult for warfarin Indication: atrial fibrillation and apical thrombus  Allergies  Allergen Reactions   Eliquis  [Apixaban ] Other (See Comments)    Doesn't Work for her. Developed a clot while taking it.   Wound Dressing Adhesive Rash    Patient Measurements:    Vital Signs: Temp: 97.5 F (36.4 C) (12/05 1034) BP: 118/86 (12/05 1034) Pulse Rate: 74 (12/05 1034)  Labs: Recent Labs    12/26/23 1109  HGB 11.2*  HCT 34.4*  PLT 296  LABPROT 24.5*  INR 2.1*  CREATININE 1.12*    Estimated Creatinine Clearance: 32.6 mL/min (A) (by C-G formula based on SCr of 1.12 mg/dL (H)).   Medical History: Past Medical History:  Diagnosis Date   Anemia    Atrial fibrillation (HCC)    CHF (congestive heart failure) (HCC)    DVT (deep venous thrombosis) (HCC)    in her heart   Hyperlipidemia    Hypertension    Hypothyroidism    Irregular heart rate    Thrombus in heart chamber 04/11/2022    Assessment: 15 YOF presenting with persistent dyspnea and volume overload, hx of afib and apical thrombus on warfarin PTA, INR low end therapeutic on admission at 2.1, last dose taken 12/4  PTA dosing: 4mg  daily  Goal of Therapy:  INR 2-3 Monitor platelets by anticoagulation protocol: Yes   Plan:  Warfarin 4mg  PO x1 Daily INR, s/s bleeding  Dorn Poot, PharmD, [facility] Clinical Pharmacist ED Pharmacist Phone # [phone] 12/26/2023 2:52 PM

## 2023-12-26 NOTE — Assessment & Plan Note (Addendum)
 Continue blood pressure control with losartan  and metoprolol .

## 2023-12-27 LAB — PROTIME-INR
INR: 1.9 — ABNORMAL HIGH (ref 0.8–1.2)
INR: 2 — ABNORMAL HIGH (ref 0.8–1.2)
Prothrombin Time: 22.7 s — ABNORMAL HIGH (ref 11.4–15.2)
Prothrombin Time: 23.3 s — ABNORMAL HIGH (ref 11.4–15.2)

## 2023-12-27 LAB — MAGNESIUM: Magnesium: 2 mg/dL (ref 1.7–2.4)

## 2023-12-27 LAB — BASIC METABOLIC PANEL WITH GFR
Anion gap: 10 (ref 5–15)
BUN: 15 mg/dL (ref 8–23)
CO2: 27 mmol/L (ref 22–32)
Calcium: 8.4 mg/dL — ABNORMAL LOW (ref 8.9–10.3)
Chloride: 96 mmol/L — ABNORMAL LOW (ref 98–111)
Creatinine, Ser: 1.02 mg/dL — ABNORMAL HIGH (ref 0.44–1.00)
GFR, Estimated: 54 mL/min — ABNORMAL LOW (ref >60)
Glucose, Bld: 97 mg/dL (ref 70–99)
Potassium: 3.6 mmol/L (ref 3.5–5.1)
Sodium: 133 mmol/L — ABNORMAL LOW (ref 135–145)

## 2023-12-27 MED ORDER — POTASSIUM CHLORIDE CRYS ER 20 MEQ PO TBCR
40.0000 meq | EXTENDED_RELEASE_TABLET | Freq: Once | ORAL | Status: AC
  Administered 2023-12-27: 40 meq via ORAL
  Filled 2023-12-27: qty 2

## 2023-12-27 MED ORDER — WARFARIN SODIUM 5 MG PO TABS
5.0000 mg | ORAL_TABLET | ORAL | Status: AC
  Administered 2023-12-27: 5 mg via ORAL
  Filled 2023-12-27: qty 1

## 2023-12-27 NOTE — Progress Notes (Signed)
 PROGRESS NOTE  Paula [name] FMW:[id number] DOB: [birth year] DOA: 12/26/2023 PCP: Charlott Dorn LABOR, MD   LOS: 1 day   Brief Narrative / Interim history: 88 year old female with history of A-fib on Multaq , pacemaker, hypertrophic cardiomyopathy, prior cardiac thrombus while on Eliquis  now on Coumadin , chronic hyponatremia, hypothyroidism who comes into the hospital with fluid overload directed from cardiology clinic.  She was recently hospitalized and discharged November 29, and at that time her 2D echocardiogram showed new onset RV systolic dysfunction with moderately elevated PA pressure, and her LVEF decreased to 40 And 45% from prior normal values.  She comes into the hospital with dyspnea on discharge, 10 pound weight gain and evidence of fluid overload.  Cardiology consulted  Subjective / 24h Interval events: She is feeling better this morning, was in the bathroom all night urinating.  Breathing is better.  She denies any chest pain, no nausea or vomiting.  Assesement and Plan: Principal problem Acute on chronic combined CHF -patient was admitted to the hospital with evidence of fluid overload.  Cardiology consulted and are evaluating, appreciate input.  Currently she is being diuresed with IV furosemide , continue - Monitor strict ins and outs, daily weights  Active problems Hyponatremia-fairly chronic, sodium 133 this morning, monitor, no large shifts noted.  PAF, presence of pacemaker -she is in A-fib, continue anticoagulation with Coumadin , continue metoprolol  for rate control.  Per cardiology, may attempt cardioversion on Monday once better diuresed - Continue dronedarone   Essential hypertension-she is on metoprolol , continue, also on furosemide  and Entresto  per cardiology.  Monitor blood pressure.  Apical variant hypertrophic cardiomyopathy-noted  CKD 3A-baseline creatinine around 1.0-1.1, currently at baseline, monitor while getting diuresed.   Scheduled Meds:   dronedarone   400 mg Oral BID WC   furosemide   40 mg Intravenous BID   levothyroxine   125 mcg Oral QAC breakfast   magnesium  oxide  400 mg Oral Daily   metoprolol  succinate  25 mg Oral Daily   potassium chloride   20 mEq Oral Daily   sacubitril -valsartan   1 tablet Oral BID   Warfarin - Pharmacist Dosing Inpatient   Does not apply q1600   Continuous Infusions: PRN Meds:.acetaminophen  **OR** acetaminophen , ondansetron  **OR** ondansetron  (ZOFRAN ) IV, polyethylene glycol  Current Outpatient Medications  Medication Instructions   acetaminophen  (TYLENOL ) 650 mg, Every 4 hours PRN   Calcium  Carb-Cholecalciferol 600-5 MG-MCG TABS 1 tablet, Daily   diltiazem  (CARDIZEM  CD) 300 mg, Oral, Daily   dronedarone  (MULTAQ ) 400 mg, Oral, 2 times daily with meals   famotidine  (PEPCID  AC) 10 mg, Daily at bedtime   furosemide  (LASIX ) 20 mg, Oral, Daily   levothyroxine  (SYNTHROID ) 125 mcg, Daily before breakfast   Linzess  290 mcg, Oral, Daily before breakfast   metoprolol  succinate (TOPROL  XL) 25 mg, Oral, Daily, At night after dinner   Multiple Vitamin (MULTIVITAMIN WITH MINERALS) TABS tablet 1 tablet, Daily   polyethylene glycol (MIRALAX  / GLYCOLAX ) 17 g, Every evening   prazosin  (MINIPRESS ) 1 mg, Oral, Daily at bedtime   sodium chloride  (OCEAN) 0.65 % SOLN nasal spray 1 spray, As needed   triamcinolone cream (KENALOG) 0.1 % 1 Application, 2 times daily PRN   valsartan  (DIOVAN ) 160 mg, Oral, Daily   warfarin (COUMADIN ) 4 MG tablet TAKE 1 TABLET BY MOUTH ONCE DAILY OR AS DIRECTED BY ANTICOAGULATION CLINIC    Diet Orders (From admission, onward)     Start     Ordered   12/29/23 0001  Diet NPO time specified  Diet effective midnight  12/27/23 0919   12/26/23 1400  Diet heart healthy/carb modified Room service appropriate? Yes; Fluid consistency: Thin; Fluid restriction: 1800 mL Fluid  Diet effective now       Question Answer Comment  Diet-HS Snack? Nothing   Room service appropriate? Yes   Fluid  consistency: Thin   Fluid restriction: 1800 mL Fluid      12/26/23 1359            DVT prophylaxis: SCDs Start: 12/26/23 1733   Lab Results  Component Value Date   PLT 296 12/26/2023      Code Status: Full Code  Family Communication: No family at bedside  Status is: Inpatient Remains inpatient appropriate because: Ongoing IV diuresis   Level of care: Telemetry  Consultants:  Cardiology  Objective: Vitals:   12/26/23 2007 12/27/23 0019 12/27/23 0402 12/27/23 0403  BP: (!) 130/97 119/77  123/80  Pulse: 81 88  (!) 114  Resp: 20 18  20   Temp: 97.9 F (36.6 C) 97.9 F (36.6 C)  98 F (36.7 C)  TempSrc: Oral Oral  Oral  SpO2: 99% 95%  97%  Weight:  61.8 kg 61.8 kg   Height:        Intake/Output Summary (Last 24 hours) at 12/27/2023 9063 Last data filed at 12/27/2023 0600 Gross per 24 hour  Intake 120 ml  Output 4050 ml  Net -3930 ml   Wt Readings from Last 3 Encounters:  12/27/23 61.8 kg  12/26/23 66 kg  12/20/23 59.4 kg    Examination:  Constitutional: NAD Eyes: no scleral icterus ENMT: Mucous membranes are moist.  Neck: normal, supple Respiratory: Diminished at the bases but overall clear without wheezing Cardiovascular: Irregular.  1+ lower extremity edema Abdomen: non distended, no tenderness. Bowel sounds positive.  Musculoskeletal: no clubbing / cyanosis.   Data Reviewed: I have independently reviewed following labs and imaging studies   CBC Recent Labs  Lab 12/26/23 1109  WBC 6.1  HGB 11.2*  HCT 34.4*  PLT 296  MCV 88.9  MCH 28.9  MCHC 32.6  RDW 14.2    Recent Labs  Lab 12/20/23 1405 12/26/23 1109 12/27/23 0229  NA 129* 133* 133*  K 4.5 4.2 3.6  CL 96* 98 96*  CO2 23 25 27   GLUCOSE 98 98 97  BUN 14 15 15   CREATININE 0.98 1.12* 1.02*  CALCIUM  8.1* 8.4* 8.4*  MG  --  2.1 2.0  INR  --  2.1* 1.9*  TSH  --  13.882*  --   BNP  --  763.0*  --      ------------------------------------------------------------------------------------------------------------------ No results for input(s): CHOL, HDL, LDLCALC, TRIG, CHOLHDL, LDLDIRECT in the last 72 hours.  No results found for: HGBA1C ------------------------------------------------------------------------------------------------------------------ Recent Labs    12/26/23 1109  TSH 13.882*    Cardiac Enzymes No results for input(s): CKMB, TROPONINI, MYOGLOBIN in the last 168 hours.  Invalid input(s): CK ------------------------------------------------------------------------------------------------------------------    Component Value Date/Time   BNP 763.0 (H) 12/26/2023 1109    CBG: No results for input(s): GLUCAP in the last 168 hours.  Recent Results (from the past 240 hours)  Resp panel by RT-PCR (RSV, Flu A&B, Covid) Anterior Nasal Swab     Status: None   Collection Time: 12/18/23  3:19 PM   Specimen: Anterior Nasal Swab  Result Value Ref Range Status   SARS Coronavirus 2 by RT PCR NEGATIVE NEGATIVE Final    Comment: (NOTE) SARS-CoV-2 target nucleic acids are NOT DETECTED.  The SARS-CoV-2  RNA is generally detectable in upper respiratory specimens during the acute phase of infection. The lowest concentration of SARS-CoV-2 viral copies this assay can detect is 138 copies/mL. A negative result does not preclude SARS-Cov-2 infection and should not be used as the sole basis for treatment or other patient management decisions. A negative result may occur with  improper specimen collection/handling, submission of specimen other than nasopharyngeal swab, presence of viral mutation(s) within the areas targeted by this assay, and inadequate number of viral copies(<138 copies/mL). A negative result must be combined with clinical observations, patient history, and epidemiological information. The expected result is Negative.  Fact Sheet for  Patients:  bloggercourse.com  Fact Sheet for Healthcare Providers:  seriousbroker.it  This test is no t yet approved or cleared by the [place]  FDA and  has been authorized for detection and/or diagnosis of SARS-CoV-2 by FDA under an Emergency Use Authorization (EUA). This EUA will remain  in effect (meaning this test can be used) for the duration of the COVID-19 declaration under Section 564(b)(1) of the Act, 21 U.S.C.section 360bbb-3(b)(1), unless the authorization is terminated  or revoked sooner.       Influenza A by PCR NEGATIVE NEGATIVE Final   Influenza B by PCR NEGATIVE NEGATIVE Final    Comment: (NOTE) The Xpert Xpress SARS-CoV-2/FLU/RSV plus assay is intended as an aid in the diagnosis of influenza from Nasopharyngeal swab specimens and should not be used as a sole basis for treatment. Nasal washings and aspirates are unacceptable for Xpert Xpress SARS-CoV-2/FLU/RSV testing.  Fact Sheet for Patients: bloggercourse.com  Fact Sheet for Healthcare Providers: seriousbroker.it  This test is not yet approved or cleared by the [place]  FDA and has been authorized for detection and/or diagnosis of SARS-CoV-2 by FDA under an Emergency Use Authorization (EUA). This EUA will remain in effect (meaning this test can be used) for the duration of the COVID-19 declaration under Section 564(b)(1) of the Act, 21 U.S.C. section 360bbb-3(b)(1), unless the authorization is terminated or revoked.     Resp Syncytial Virus by PCR NEGATIVE NEGATIVE Final    Comment: (NOTE) Fact Sheet for Patients: bloggercourse.com  Fact Sheet for Healthcare Providers: seriousbroker.it  This test is not yet approved or cleared by the [place]  FDA and has been authorized for detection and/or diagnosis of SARS-CoV-2 by FDA under an Emergency  Use Authorization (EUA). This EUA will remain in effect (meaning this test can be used) for the duration of the COVID-19 declaration under Section 564(b)(1) of the Act, 21 U.S.C. section 360bbb-3(b)(1), unless the authorization is terminated or revoked.  Performed at [facility], [address], [place], [place] [address]      Radiology Studies: DG Chest 2 View Result Date: 12/26/2023 CLINICAL DATA:  Chest pain EXAM: CHEST - 2 VIEW COMPARISON:  Prior chest x-ray 12/18/2023 FINDINGS: Left subclavian approach cardiac rhythm maintenance device remains in unchanged position. Mild cardiomegaly, stable. Atherosclerotic calcifications in the transverse aorta. Diffuse mild chronic bronchitic changes. No focal airspace infiltrate, pleural effusion, pulmonary edema or pneumothorax. Right basilar pleuroparenchymal scarring. No acute osseous abnormality. IMPRESSION: No active cardiopulmonary disease. Electronically Signed   By: Wilkie Lent M.D.   On: 12/26/2023 11:53     Nilda Fendt, MD, PhD [facility]  Between 7 am - 7 pm I am available, please contact me via Amion (for emergencies) or Securechat (non urgent messages)  Between 7 pm - 7 am I am not available, please contact night coverage MD/APP via Amion

## 2023-12-27 NOTE — Progress Notes (Signed)
 PHARMACY - ANTICOAGULATION CONSULT NOTE  Pharmacy Consult for warfarin Indication: atrial fibrillation and apical thrombus  Allergies  Allergen Reactions   Eliquis  [Apixaban ] Other (See Comments)    Doesn't Work for her. Developed a clot while taking it.   Wound Dressing Adhesive Rash    Patient Measurements: Height: 5' 2.5 (158.8 cm) Weight: 61.8 kg (136 lb 4.8 oz) IBW/kg (Calculated) : 51.25 HEPARIN  DW (KG): 64.3  Vital Signs: Temp: 98 F (36.7 C) (12/06 0403) Temp Source: Oral (12/06 0403) BP: 123/80 (12/06 0403) Pulse Rate: 114 (12/06 0403)  Labs: Recent Labs    12/26/23 1109 12/27/23 0229  HGB 11.2*  --   HCT 34.4*  --   PLT 296  --   LABPROT 24.5* 22.7*  INR 2.1* 1.9*  CREATININE 1.12* 1.02*    Estimated Creatinine Clearance: 34.7 mL/min (A) (by C-G formula based on SCr of 1.02 mg/dL (H)).   Medical History: Past Medical History:  Diagnosis Date   Anemia    Atrial fibrillation (HCC)    CHF (congestive heart failure) (HCC)    DVT (deep venous thrombosis) (HCC)    in her heart   Hyperlipidemia    Hypertension    Hypothyroidism    Irregular heart rate    Thrombus in heart chamber 04/11/2022    Assessment: 88 YOF presenting with persistent dyspnea and volume overload, hx of afib and apical thrombus on warfarin PTA, INR low end therapeutic on admission at 2.1, last dose taken 12/4.   12/6 AM: Patient slightly subtherapeutic at INR 1.9 on PTA warfarin regimen. Pt currently volume up on diuresis, which may be impacting warfarin absorption. Will plan on augmented warfarin dose today.   PTA dosing: 4mg  daily  Goal of Therapy:  INR 2-3 Monitor platelets by anticoagulation protocol: Yes   Plan:  Warfarin 5 mg PO x1 now per cards with planned recheck at 1600  Daily INR, s/s bleeding  Massie Fila, PharmD Clinical Pharmacist  12/27/2023 8:16 AM

## 2023-12-27 NOTE — Progress Notes (Signed)
   Cardiologist:  Schumann/Lambert  Subjective:   Breathing improved   Objective:  Vitals:   12/26/23 2007 12/27/23 0019 12/27/23 0402 12/27/23 0403  BP: (!) 130/97 119/77  123/80  Pulse: 81 88  (!) 114  Resp: 20 18  20   Temp: 97.9 F (36.6 C) 97.9 F (36.6 C)  98 F (36.7 C)  TempSrc: Oral Oral  Oral  SpO2: 99% 95%  97%  Weight:  61.8 kg 61.8 kg   Height:        Intake/Output from previous day:  Intake/Output Summary (Last 24 hours) at 12/27/2023 0859 Last data filed at 12/27/2023 0600 Gross per 24 hour  Intake 120 ml  Output 4050 ml  Net -3930 ml    Physical Exam:  Spry elderly female  JVP elevated  Apical MR murmur Abdomen benign  No edema Abdomen benign PPM under left clavicle   Lab Results: Basic Metabolic Panel: Recent Labs    12/26/23 1109 12/27/23 0229  NA 133* 133*  K 4.2 3.6  CL 98 96*  CO2 25 27  GLUCOSE 98 97  BUN 15 15  CREATININE 1.12* 1.02*  CALCIUM  8.4* 8.4*  MG 2.1 2.0    CBC: Recent Labs    12/26/23 1109  WBC 6.1  HGB 11.2*  HCT 34.4*  MCV 88.9  PLT 296    Thyroid  Function Tests: Recent Labs    12/26/23 1109  TSH 13.882*     Imaging: DG Chest 2 View Result Date: 12/26/2023 CLINICAL DATA:  Chest pain EXAM: CHEST - 2 VIEW COMPARISON:  Prior chest x-ray 12/18/2023 FINDINGS: Left subclavian approach cardiac rhythm maintenance device remains in unchanged position. Mild cardiomegaly, stable. Atherosclerotic calcifications in the transverse aorta. Diffuse mild chronic bronchitic changes. No focal airspace infiltrate, pleural effusion, pulmonary edema or pneumothorax. Right basilar pleuroparenchymal scarring. No acute osseous abnormality. IMPRESSION: No active cardiopulmonary disease. Electronically Signed   By: Wilkie Lent M.D.   On: 12/26/2023 11:53    Cardiac Studies:  ECG: Afib V pacing    Telemetry: Afib rates 80-95 with escape V pacing   Echo: 11/28  EF 40-45% severe LAE severe TR moderate  MR  Medications:    dronedarone   400 mg Oral BID WC   furosemide   40 mg Intravenous BID   levothyroxine   125 mcg Oral QAC breakfast   magnesium  oxide  400 mg Oral Daily   metoprolol  succinate  25 mg Oral Daily   potassium chloride   20 mEq Oral Daily   sacubitril -valsartan   1 tablet Oral BID   Warfarin - Pharmacist Dosing Inpatient   Does not apply q1600      Assessment/Plan:   PAF:  unlikely to maintain NSR given age, apical HOCM, severe LAE Limited options for AAT continue Multaq . Rates ok on Toprol  25 mg. INR 1.9 Has been Rx since 10/16  Pharmacy dosing would give extra coumadin  today. I think she would be ok for [facility] Monday without TEE which would be higher risk Will give extra 5 mg dose coumadin  this am and note to pharmacy Check INR agina 4:00 pm. CHADVAS 6  CHF:  improved lasix  iv 40 mg bid continue entresto  and beta blocker EF 40-45% Valve Dx:  severe TR and moderate MR not a candidate for clip Rx given age Thyroid :  on synthroid   TSH 13.9 consider increasing dose to 150 micrograms/day SSS:  normal PPM function    Paula [name] 12/27/2023, 8:59 AM

## 2023-12-28 LAB — CBC
HCT: 39 % (ref 36.0–46.0)
Hemoglobin: 12.8 g/dL (ref 12.0–15.0)
MCH: 27.9 pg (ref 26.0–34.0)
MCHC: 32.8 g/dL (ref 30.0–36.0)
MCV: 85.2 fL (ref 80.0–100.0)
Platelets: 295 K/uL (ref 150–400)
RBC: 4.58 MIL/uL (ref 3.87–5.11)
RDW: 14.3 % (ref 11.5–15.5)
WBC: 6.3 K/uL (ref 4.0–10.5)
nRBC: 0 % (ref 0.0–0.2)

## 2023-12-28 LAB — COMPREHENSIVE METABOLIC PANEL WITH GFR
ALT: 29 U/L (ref 0–44)
AST: 31 U/L (ref 15–41)
Albumin: 3.4 g/dL — ABNORMAL LOW (ref 3.5–5.0)
Alkaline Phosphatase: 63 U/L (ref 38–126)
Anion gap: 15 (ref 5–15)
BUN: 12 mg/dL (ref 8–23)
CO2: 26 mmol/L (ref 22–32)
Calcium: 8.4 mg/dL — ABNORMAL LOW (ref 8.9–10.3)
Chloride: 94 mmol/L — ABNORMAL LOW (ref 98–111)
Creatinine, Ser: 1.11 mg/dL — ABNORMAL HIGH (ref 0.44–1.00)
GFR, Estimated: 48 mL/min — ABNORMAL LOW (ref >60)
Glucose, Bld: 89 mg/dL (ref 70–99)
Potassium: 4 mmol/L (ref 3.5–5.1)
Sodium: 135 mmol/L (ref 135–145)
Total Bilirubin: 0.8 mg/dL (ref 0.0–1.2)
Total Protein: 6.6 g/dL (ref 6.5–8.1)

## 2023-12-28 LAB — MAGNESIUM: Magnesium: 2.1 mg/dL (ref 1.7–2.4)

## 2023-12-28 LAB — PROTIME-INR
INR: 2.2 — ABNORMAL HIGH (ref 0.8–1.2)
Prothrombin Time: 25.7 s — ABNORMAL HIGH (ref 11.4–15.2)

## 2023-12-28 LAB — PHOSPHORUS: Phosphorus: 2.3 mg/dL — ABNORMAL LOW (ref 2.5–4.6)

## 2023-12-28 MED ORDER — K PHOS MONO-SOD PHOS DI & MONO 155-852-130 MG PO TABS
500.0000 mg | ORAL_TABLET | Freq: Two times a day (BID) | ORAL | Status: AC
  Administered 2023-12-28 (×2): 500 mg via ORAL
  Filled 2023-12-28 (×2): qty 2

## 2023-12-28 MED ORDER — WARFARIN SODIUM 3 MG PO TABS
6.0000 mg | ORAL_TABLET | Freq: Once | ORAL | Status: AC
  Administered 2023-12-28: 6 mg via ORAL
  Filled 2023-12-28: qty 2

## 2023-12-28 MED ORDER — WARFARIN SODIUM 2 MG PO TABS: 4.0000 mg | ORAL_TABLET | Freq: Every day | ORAL | Status: DC

## 2023-12-28 NOTE — Progress Notes (Addendum)
 PHARMACY - ANTICOAGULATION CONSULT NOTE  Pharmacy Consult for warfarin Indication: atrial fibrillation and apical thrombus  Allergies  Allergen Reactions   Eliquis  [Apixaban ] Other (See Comments)    Doesn't Work for her. Developed a clot while taking it.   Wound Dressing Adhesive Rash    Patient Measurements: Height: 5' 2.5 (158.8 cm) Weight: 58.6 kg (129 lb 3.2 oz) IBW/kg (Calculated) : 51.25 HEPARIN  DW (KG): 64.3  Vital Signs: Temp: 97.7 F (36.5 C) (12/07 0600) Temp Source: Oral (12/07 0600) BP: 117/78 (12/07 0600) Pulse Rate: 84 (12/07 0600)  Labs: Recent Labs    12/26/23 1109 12/27/23 0229 12/27/23 1640 12/28/23 0254  HGB 11.2*  --   --  12.8  HCT 34.4*  --   --  39.0  PLT 296  --   --  295  LABPROT 24.5* 22.7* 23.3* 25.7*  INR 2.1* 1.9* 2.0* 2.2*  CREATININE 1.12* 1.02*  --  1.11*    Estimated Creatinine Clearance: 29.5 mL/min (A) (by C-G formula based on SCr of 1.11 mg/dL (H)).   Medical History: Past Medical History:  Diagnosis Date   Anemia    Atrial fibrillation (HCC)    CHF (congestive heart failure) (HCC)    DVT (deep venous thrombosis) (HCC)    in her heart   Hyperlipidemia    Hypertension    Hypothyroidism    Irregular heart rate    Thrombus in heart chamber 04/11/2022    Assessment: 88 YOF presenting with persistent dyspnea and volume overload, hx of afib and apical thrombus on warfarin PTA, INR low end therapeutic on admission at 2.1, last dose taken 12/4.   12/7 AM: Patient with therapeutic INR 2.2 following augmented dose yesterday. Too soon to see effect of augmented dose. CBC WNL. Cardiology planning for DCCV in coming days.   PTA dosing: 4mg  daily  Goal of Therapy:  INR 2-3 Monitor platelets by anticoagulation protocol: Yes   Plan:  Warfarin 4 mg daily per PTA regimen- standing order entered  Daily INR, s/s bleeding  ADDENDUM:  Cardiology physician did not agree with 4 mg dose as previously ordered and per patient's PTA  regimen and ordered a boosted dose of 6 mg in order to ensure the patient's INR is therapeutic for cardioversion Monday. There was discussion that patient was previously supratherapeutic with INR 3.5 at her 11/16/23 clinic appointment on her PTA regimen of 4 mg daily. Of note, patient also received a boosted dose of 5 mg yesterday d/t dip in INR to 1.9. I discussed with Cardiologist that the dip was likely d/t reduced absorption associated with congestion and as she becomes decongested she will likely become more responsive to warfarin. It was also emphasized that INR tends to respond to warfarin dosing from 2-3 days prior, and therefore the 5 mg dose is not yet reflected in the patient's INR that ws drawn today and resulted at 2.2. As a result, continued augmented doses will likely result in an INR increase in coming days placing patient at risk of a supratherapeutic INR.  We discussed removing the consult for pharmacy to dose warfarin as he has been selecting his own doses over the past 2 days. He did not wish to remove the consult and have pharmacy continue to dose warfarin.   Massie Fila, PharmD Clinical Pharmacist  12/28/2023 7:44 AM

## 2023-12-28 NOTE — Anesthesia Preprocedure Evaluation (Signed)
 Anesthesia Evaluation  Patient identified by MRN, date of birth, ID band Patient awake    Reviewed: Allergy & Precautions, NPO status , Patient's Chart, lab work & pertinent test results  History of Anesthesia Complications Negative for: history of anesthetic complications  Airway Mallampati: II  TM Distance: >3 FB Neck ROM: Full    Dental no notable dental hx. (+) Teeth Intact, Dental Advisory Given   Pulmonary neg pulmonary ROS   Pulmonary exam normal breath sounds clear to auscultation       Cardiovascular hypertension, Pt. on medications and Pt. on home beta blockers (-) angina +CHF and + DVT  (-) Past MI Normal cardiovascular exam+ dysrhythmias Atrial Fibrillation + pacemaker  Rhythm:Irregular Rate:Abnormal  EF 40-45%  hypertrophic cardiomyopathy, prior history of cardiac thrombus     Neuro/Psych negative neurological ROS  negative psych ROS   GI/Hepatic negative GI ROS, Neg liver ROS,,,  Endo/Other  Hypothyroidism    Renal/GU   negative genitourinary   Musculoskeletal   Abdominal   Peds  Hematology  (+) Blood dyscrasia (on coumadin )   Anesthesia Other Findings   Reproductive/Obstetrics                              Anesthesia Physical Anesthesia Plan  ASA: 3  Anesthesia Plan: General   Post-op Pain Management:    Induction: Intravenous  PONV Risk Score and Plan: Treatment may vary due to age or medical condition  Airway Management Planned: Natural Airway and Nasal Cannula  Additional Equipment: None  Intra-op Plan:   Post-operative Plan:   Informed Consent: I have reviewed the patients History and Physical, chart, labs and discussed the procedure including the risks, benefits and alternatives for the proposed anesthesia with the patient or authorized representative who has indicated his/her understanding and acceptance.     Dental advisory given  Plan Discussed  with:   Anesthesia Plan Comments:          Anesthesia Quick Evaluation

## 2023-12-28 NOTE — Progress Notes (Signed)
   Cardiologist:  Schumann/Lambert  Subjective:   No complaints.  Her INR slipped to 1.9 for a few hours 12/6 but she got an extra dose and was Rx again in PM> She has been on coumadin  chronically with Rx INR;s since 10/16  Objective:  Vitals:   12/28/23 0600 12/28/23 0815 12/28/23 0851 12/28/23 1158  BP: 117/78 101/71 105/73   Pulse: 84 93 97 (!) 106  Resp: 19 20  18   Temp: 97.7 F (36.5 C) 97.8 F (36.6 C)    TempSrc: Oral Oral  Oral  SpO2: 98% 98%  98%  Weight:      Height:        Intake/Output from previous day:  Intake/Output Summary (Last 24 hours) at 12/28/2023 1353 Last data filed at 12/28/2023 1210 Gross per 24 hour  Intake 340 ml  Output 2660 ml  Net -2320 ml    Physical Exam:  Spry elderly female  JVP elevated  Apical MR murmur Abdomen benign  No edema Abdomen benign PPM under left clavicle   Lab Results: Basic Metabolic Panel: Recent Labs    12/27/23 0229 12/28/23 0254  NA 133* 135  K 3.6 4.0  CL 96* 94*  CO2 27 26  GLUCOSE 97 89  BUN 15 12  CREATININE 1.02* 1.11*  CALCIUM  8.4* 8.4*  MG 2.0 2.1  PHOS  --  2.3*    CBC: Recent Labs    12/26/23 1109 12/28/23 0254  WBC 6.1 6.3  HGB 11.2* 12.8  HCT 34.4* 39.0  MCV 88.9 85.2  PLT 296 295    Thyroid  Function Tests: Recent Labs    12/26/23 1109  TSH 13.882*     Imaging: No results found.  Cardiac Studies:  ECG: Afib V pacing    Telemetry: Afib rates 80-95 with escape V pacing   Echo: 11/28  EF 40-45% severe LAE severe TR moderate MR  Medications:    dronedarone   400 mg Oral BID WC   furosemide   40 mg Intravenous BID   levothyroxine   125 mcg Oral QAC breakfast   magnesium  oxide  400 mg Oral Daily   metoprolol  succinate  25 mg Oral Daily   phosphorus  500 mg Oral BID   potassium chloride   20 mEq Oral Daily   sacubitril -valsartan   1 tablet Oral BID   warfarin  4 mg Oral q1600   Warfarin - Pharmacist Dosing Inpatient   Does not apply q1600      Assessment/Plan:    PAF:  unlikely to maintain NSR given age, apical HOCM, severe LAE Limited options for AAT continue Multaq . Rates ok on Toprol  25 mg. INR 1.9 Has been Rx since 10/16  Pharmacy dosing  I will write for 6 mg dose today instead of 4 mg to make sure INR > 2.0 in am I think she would be ok for [facility] Monday without TEE which would be higher riskI NR was 2.0 and Rx 12/6 in afternoon  CHADVAS 6  CHF:  improved lasix  iv 40 mg bid continue entresto  and beta blocker EF 40-45% Valve Dx:  severe TR and moderate MR not a candidate for clip Rx given age Thyroid :  on synthroid   TSH 13.9 consider increasing dose to 150 micrograms/day SSS:  normal PPM function    Maude Emmer 12/28/2023, 1:53 PM

## 2023-12-28 NOTE — Progress Notes (Signed)
 PROGRESS NOTE  Paula [name] FMW:[id number] DOB: 03-11-1935 DOA: 12/26/2023 PCP: Charlott Dorn LABOR, MD   LOS: 2 days   Brief Narrative / Interim history: 88 year old female with history of A-fib on Multaq , pacemaker, hypertrophic cardiomyopathy, prior cardiac thrombus while on Eliquis  now on Coumadin , chronic hyponatremia, hypothyroidism who comes into the hospital with fluid overload directed from cardiology clinic.  She was recently hospitalized and discharged November 29, and at that time her 2D echocardiogram showed new onset RV systolic dysfunction with moderately elevated PA pressure, and her LVEF decreased to 40 And 45% from prior normal values.  She comes into the hospital with dyspnea on discharge, 10 pound weight gain and evidence of fluid overload.  Cardiology consulted  Subjective / 24h Interval events: Feeling almost back to baseline.  Feels like her swelling has gone down significantly, still has some around her waist but better than when she first came in  Assesement and Plan: Principal problem Acute on chronic combined CHF -patient was admitted to the hospital with evidence of fluid overload.  Cardiology consulted and are evaluating, appreciate input.  Currently she is being diuresed with IV furosemide , continue per cardiology - Monitor strict ins and outs, daily weights.  She is at 129 pounds, was 142 on admission  Active problems Hyponatremia-fairly chronic, remains actually normalized today after diuresis  PAF, presence of pacemaker -she is in A-fib, continue anticoagulation with Coumadin , continue metoprolol  for rate control.  Per cardiology, may attempt cardioversion on Monday once better diuresed - Continue dronedarone   Essential hypertension-on metoprolol , Entresto , furosemide   Apical variant hypertrophic cardiomyopathy-noted  CKD 3A-baseline creatinine around 1.0-1.1, currently at baseline, monitor while getting diuresed.  Hypothyroidism-continue  Synthroid   Scheduled Meds:  dronedarone   400 mg Oral BID WC   furosemide   40 mg Intravenous BID   levothyroxine   125 mcg Oral QAC breakfast   magnesium  oxide  400 mg Oral Daily   metoprolol  succinate  25 mg Oral Daily   potassium chloride   20 mEq Oral Daily   sacubitril -valsartan   1 tablet Oral BID   warfarin  4 mg Oral q1600   Warfarin - Pharmacist Dosing Inpatient   Does not apply q1600   Continuous Infusions: PRN Meds:.acetaminophen  **OR** acetaminophen , ondansetron  **OR** ondansetron  (ZOFRAN ) IV, polyethylene glycol  Current Outpatient Medications  Medication Instructions   acetaminophen  (TYLENOL ) 650 mg, Every 4 hours PRN   Calcium  Carb-Cholecalciferol 600-5 MG-MCG TABS 1 tablet, Daily   diltiazem  (CARDIZEM  CD) 300 mg, Oral, Daily   dronedarone  (MULTAQ ) 400 mg, Oral, 2 times daily with meals   famotidine  (PEPCID  AC) 10 mg, Daily at bedtime   furosemide  (LASIX ) 20 mg, Oral, Daily   levothyroxine  (SYNTHROID ) 125 mcg, Daily before breakfast   Linzess  290 mcg, Oral, Daily before breakfast   metoprolol  succinate (TOPROL  XL) 25 mg, Oral, Daily, At night after dinner   Multiple Vitamin (MULTIVITAMIN WITH MINERALS) TABS tablet 1 tablet, Daily   polyethylene glycol (MIRALAX  / GLYCOLAX ) 17 g, Every evening   prazosin  (MINIPRESS ) 1 mg, Oral, Daily at bedtime   sodium chloride  (OCEAN) 0.65 % SOLN nasal spray 1 spray, As needed   triamcinolone cream (KENALOG) 0.1 % 1 Application, 2 times daily PRN   valsartan  (DIOVAN ) 160 mg, Oral, Daily   warfarin (COUMADIN ) 4 MG tablet TAKE 1 TABLET BY MOUTH ONCE DAILY OR AS DIRECTED BY ANTICOAGULATION CLINIC    Diet Orders (From admission, onward)     Start     Ordered   12/29/23 0001  Diet NPO time  specified  Diet effective midnight        12/27/23 0919   12/26/23 1400  Diet heart healthy/carb modified Room service appropriate? Yes; Fluid consistency: Thin; Fluid restriction: 1800 mL Fluid  Diet effective now       Question Answer Comment   Diet-HS Snack? Nothing   Room service appropriate? Yes   Fluid consistency: Thin   Fluid restriction: 1800 mL Fluid      12/26/23 1359            DVT prophylaxis: SCDs Start: 12/26/23 1733 warfarin (COUMADIN ) tablet 4 mg   Lab Results  Component Value Date   PLT 295 12/28/2023      Code Status: Full Code  Family Communication: No family at bedside  Status is: Inpatient Remains inpatient appropriate because: Ongoing IV diuresis   Level of care: Telemetry  Consultants:  Cardiology  Objective: Vitals:   12/28/23 0500 12/28/23 0600 12/28/23 0815 12/28/23 0851  BP:  117/78 101/71 105/73  Pulse:  84 93 97  Resp:  19 20   Temp:  97.7 F (36.5 C) 97.8 F (36.6 C)   TempSrc:  Oral Oral   SpO2:  98% 98%   Weight: 58.6 kg     Height:        Intake/Output Summary (Last 24 hours) at 12/28/2023 1025 Last data filed at 12/28/2023 0700 Gross per 24 hour  Intake 480 ml  Output 3460 ml  Net -2980 ml   Wt Readings from Last 3 Encounters:  12/28/23 58.6 kg  12/26/23 66 kg  12/20/23 59.4 kg    Examination: Constitutional: NAD Eyes: lids and conjunctivae normal, no scleral icterus ENMT: mmm Neck: normal, supple Respiratory: clear to auscultation bilaterally, no wheezing, no crackles. Normal respiratory effort.  Cardiovascular: Irregular. No LE edema. Abdomen: soft, no distention, no tenderness. Bowel sounds positive.   Data Reviewed: I have independently reviewed following labs and imaging studies   CBC Recent Labs  Lab 12/26/23 1109 12/28/23 0254  WBC 6.1 6.3  HGB 11.2* 12.8  HCT 34.4* 39.0  PLT 296 295  MCV 88.9 85.2  MCH 28.9 27.9  MCHC 32.6 32.8  RDW 14.2 14.3    Recent Labs  Lab 12/26/23 1109 12/27/23 0229 12/27/23 1640 12/28/23 0254  NA 133* 133*  --  135  K 4.2 3.6  --  4.0  CL 98 96*  --  94*  CO2 25 27  --  26  GLUCOSE 98 97  --  89  BUN 15 15  --  12  CREATININE 1.12* 1.02*  --  1.11*  CALCIUM  8.4* 8.4*  --  8.4*  AST  --   --    --  31  ALT  --   --   --  29  ALKPHOS  --   --   --  63  BILITOT  --   --   --  0.8  ALBUMIN  --   --   --  3.4*  MG 2.1 2.0  --  2.1  INR 2.1* 1.9* 2.0* 2.2*  TSH 13.882*  --   --   --   BNP 763.0*  --   --   --     ------------------------------------------------------------------------------------------------------------------ No results for input(s): CHOL, HDL, LDLCALC, TRIG, CHOLHDL, LDLDIRECT in the last 72 hours.  No results found for: HGBA1C ------------------------------------------------------------------------------------------------------------------ Recent Labs    12/26/23 1109  TSH 13.882*    Cardiac Enzymes No results for input(s): CKMB, TROPONINI, MYOGLOBIN in  the last 168 hours.  Invalid input(s): CK ------------------------------------------------------------------------------------------------------------------    Component Value Date/Time   BNP 763.0 (H) 12/26/2023 1109    CBG: No results for input(s): GLUCAP in the last 168 hours.  Recent Results (from the past 240 hours)  Resp panel by RT-PCR (RSV, Flu A&B, Covid) Anterior Nasal Swab     Status: None   Collection Time: 12/18/23  3:19 PM   Specimen: Anterior Nasal Swab  Result Value Ref Range Status   SARS Coronavirus 2 by RT PCR NEGATIVE NEGATIVE Final    Comment: (NOTE) SARS-CoV-2 target nucleic acids are NOT DETECTED.  The SARS-CoV-2 RNA is generally detectable in upper respiratory specimens during the acute phase of infection. The lowest concentration of SARS-CoV-2 viral copies this assay can detect is 138 copies/mL. A negative result does not preclude SARS-Cov-2 infection and should not be used as the sole basis for treatment or other patient management decisions. A negative result may occur with  improper specimen collection/handling, submission of specimen other than nasopharyngeal swab, presence of viral mutation(s) within the areas targeted by this assay, and  inadequate number of viral copies(<138 copies/mL). A negative result must be combined with clinical observations, patient history, and epidemiological information. The expected result is Negative.  Fact Sheet for Patients:  bloggercourse.com  Fact Sheet for Healthcare Providers:  seriousbroker.it  This test is no t yet approved or cleared by the [place]  FDA and  has been authorized for detection and/or diagnosis of SARS-CoV-2 by FDA under an Emergency Use Authorization (EUA). This EUA will remain  in effect (meaning this test can be used) for the duration of the COVID-19 declaration under Section 564(b)(1) of the Act, 21 U.S.C.section 360bbb-3(b)(1), unless the authorization is terminated  or revoked sooner.       Influenza A by PCR NEGATIVE NEGATIVE Final   Influenza B by PCR NEGATIVE NEGATIVE Final    Comment: (NOTE) The Xpert Xpress SARS-CoV-2/FLU/RSV plus assay is intended as an aid in the diagnosis of influenza from Nasopharyngeal swab specimens and should not be used as a sole basis for treatment. Nasal washings and aspirates are unacceptable for Xpert Xpress SARS-CoV-2/FLU/RSV testing.  Fact Sheet for Patients: bloggercourse.com  Fact Sheet for Healthcare Providers: seriousbroker.it  This test is not yet approved or cleared by the [place]  FDA and has been authorized for detection and/or diagnosis of SARS-CoV-2 by FDA under an Emergency Use Authorization (EUA). This EUA will remain in effect (meaning this test can be used) for the duration of the COVID-19 declaration under Section 564(b)(1) of the Act, 21 U.S.C. section 360bbb-3(b)(1), unless the authorization is terminated or revoked.     Resp Syncytial Virus by PCR NEGATIVE NEGATIVE Final    Comment: (NOTE) Fact Sheet for Patients: bloggercourse.com  Fact Sheet for Healthcare  Providers: seriousbroker.it  This test is not yet approved or cleared by the [place]  FDA and has been authorized for detection and/or diagnosis of SARS-CoV-2 by FDA under an Emergency Use Authorization (EUA). This EUA will remain in effect (meaning this test can be used) for the duration of the COVID-19 declaration under Section 564(b)(1) of the Act, 21 U.S.C. section 360bbb-3(b)(1), unless the authorization is terminated or revoked.  Performed at [facility], [address], [place], [place] [address]      Radiology Studies: No results found.    Nilda Fendt, MD, PhD [facility]  Between 7 am - 7 pm I am available, please contact me via Amion (for emergencies) or  Securechat (non urgent messages)  Between 7 pm - 7 am I am not available, please contact night coverage MD/APP via Amion

## 2023-12-29 ENCOUNTER — Telehealth (HOSPITAL_COMMUNITY): Payer: Self-pay | Admitting: Pharmacy Technician

## 2023-12-29 ENCOUNTER — Encounter (HOSPITAL_COMMUNITY): Admission: EM | Disposition: A | Payer: Self-pay | Source: Home / Self Care | Attending: Internal Medicine

## 2023-12-29 ENCOUNTER — Other Ambulatory Visit (HOSPITAL_COMMUNITY): Payer: Self-pay

## 2023-12-29 ENCOUNTER — Inpatient Hospital Stay (HOSPITAL_COMMUNITY): Payer: Self-pay | Admitting: Anesthesiology

## 2023-12-29 DIAGNOSIS — I5021 Acute systolic (congestive) heart failure: Secondary | ICD-10-CM

## 2023-12-29 HISTORY — PX: CARDIOVERSION: EP1203

## 2023-12-29 LAB — BASIC METABOLIC PANEL WITH GFR
Anion gap: 7 (ref 5–15)
BUN: 18 mg/dL (ref 8–23)
CO2: 30 mmol/L (ref 22–32)
Calcium: 7.9 mg/dL — ABNORMAL LOW (ref 8.9–10.3)
Chloride: 98 mmol/L (ref 98–111)
Creatinine, Ser: 1.13 mg/dL — ABNORMAL HIGH (ref 0.44–1.00)
GFR, Estimated: 47 mL/min — ABNORMAL LOW (ref >60)
Glucose, Bld: 91 mg/dL (ref 70–99)
Potassium: 4.1 mmol/L (ref 3.5–5.1)
Sodium: 135 mmol/L (ref 135–145)

## 2023-12-29 LAB — CBC
HCT: 38.7 % (ref 36.0–46.0)
Hemoglobin: 12.7 g/dL (ref 12.0–15.0)
MCH: 28.1 pg (ref 26.0–34.0)
MCHC: 32.8 g/dL (ref 30.0–36.0)
MCV: 85.6 fL (ref 80.0–100.0)
Platelets: 318 K/uL (ref 150–400)
RBC: 4.52 MIL/uL (ref 3.87–5.11)
RDW: 14.3 % (ref 11.5–15.5)
WBC: 7.4 K/uL (ref 4.0–10.5)
nRBC: 0 % (ref 0.0–0.2)

## 2023-12-29 LAB — PROTIME-INR
INR: 2.2 — ABNORMAL HIGH (ref 0.8–1.2)
Prothrombin Time: 25.8 s — ABNORMAL HIGH (ref 11.4–15.2)

## 2023-12-29 LAB — MAGNESIUM: Magnesium: 2.1 mg/dL (ref 1.7–2.4)

## 2023-12-29 SURGERY — CARDIOVERSION (CATH LAB)
Anesthesia: General

## 2023-12-29 MED ORDER — PROPOFOL 10 MG/ML IV BOLUS
INTRAVENOUS | Status: DC | PRN
  Administered 2023-12-29: 60 mg via INTRAVENOUS
  Administered 2023-12-29: 20 mg via INTRAVENOUS

## 2023-12-29 MED ORDER — TORSEMIDE 20 MG PO TABS
20.0000 mg | ORAL_TABLET | Freq: Every day | ORAL | Status: DC
  Administered 2023-12-30: 20 mg via ORAL
  Filled 2023-12-29: qty 1

## 2023-12-29 MED ORDER — LIDOCAINE HCL (PF) 2 % IJ SOLN
INTRAMUSCULAR | Status: DC | PRN
  Administered 2023-12-29: 40 mg via INTRADERMAL

## 2023-12-29 MED ORDER — LEVOTHYROXINE SODIUM 75 MCG PO TABS
150.0000 ug | ORAL_TABLET | Freq: Every day | ORAL | Status: DC
  Administered 2023-12-30: 150 ug via ORAL
  Filled 2023-12-29: qty 2

## 2023-12-29 MED ORDER — EMPAGLIFLOZIN 10 MG PO TABS
10.0000 mg | ORAL_TABLET | Freq: Every day | ORAL | Status: DC
  Administered 2023-12-30 – 2024-01-02 (×4): 10 mg via ORAL
  Filled 2023-12-29 (×4): qty 1

## 2023-12-29 MED ORDER — WARFARIN SODIUM 2 MG PO TABS
4.0000 mg | ORAL_TABLET | Freq: Every day | ORAL | Status: DC
  Administered 2023-12-29 – 2024-01-01 (×4): 4 mg via ORAL
  Filled 2023-12-29 (×4): qty 2

## 2023-12-29 MED ORDER — HYDROXYZINE HCL 10 MG PO TABS
10.0000 mg | ORAL_TABLET | Freq: Once | ORAL | Status: AC
  Administered 2023-12-30: 10 mg via ORAL
  Filled 2023-12-29: qty 1

## 2023-12-29 SURGICAL SUPPLY — 1 items: PAD DEFIB RADIO PHYSIO CONN (PAD) ×1 IMPLANT

## 2023-12-29 NOTE — CV Procedure (Signed)
    Electrical Cardioversion Procedure Note Paula [name] [id number] Aug 19, 1935  Procedure: Electrical Cardioversion Indications:  Atrial Fibrillation  Time Out: Verified patient identification, verified procedure,medications/allergies/relevent history reviewed, required imaging and test results available.  Performed  Procedure Details  During this procedure the patient is administered a total of Propofol  80 mg and Lidocaine  20 mg to achieve and maintain moderate conscious sedation.  The patient's heart rate, blood pressure, and oxygen saturation are monitored continuously during the procedure. The period of conscious sedation is 2 minutes, of which I was present face-to-face 100% of this time. Paula Chang CRNA is an independent, trained observer who assisted in the monitoring of the patient's level of consciousness.     Cardioversion was done with synchronized biphasic defibrillation with AP pads with 200watts.  The patient converted to normal sinus rhythm. The patient tolerated the procedure well   IMPRESSION:  Successful cardioversion of atrial fibrillation    Paula [name] 12/29/2023, 8:21 AM

## 2023-12-29 NOTE — Telephone Encounter (Signed)
 Patient Product/process development scientist completed.    The patient is insured through [employer]. Patient has Medicare and is not eligible for a copay card, but may be able to apply for patient assistance or Medicare RX Payment Plan (Patient Must reach out to their plan, if eligible for payment plan), if available.    Ran test claim for sacubitril-valsartan (Entresto) 24-26 mg and the current 30 day co-pay is $0.00.  Ran test claim for Farxiga 10 mg and the current 30 day co-pay is $0.00  Ran test claim for Jardiance 10 mg and the current 30 day co-pay is $0.00  This test claim was processed through [employer]- copay amounts may vary at other pharmacies due to [employer], or as the patient moves through the different stages of their insurance plan.     Reyes Sharps, CPHT Pharmacy Technician Patient Advocate Specialist Lead [facility] Patient Advocate Team Direct Number: [phone]  Fax: [phone]

## 2023-12-29 NOTE — Plan of Care (Signed)
   Problem: Education: Goal: Knowledge of General Education information will improve Description: Including pain rating scale, medication(s)/side effects and non-pharmacologic comfort measures Outcome: Progressing   Problem: Activity: Goal: Risk for activity intolerance will decrease Outcome: Progressing   Problem: Coping: Goal: Level of anxiety will decrease Outcome: Progressing

## 2023-12-29 NOTE — Interval H&P Note (Signed)
 History and Physical Interval Note:  12/29/2023 8:00 AM  Paula [name]  has presented today for surgery, with the diagnosis of afib.  The various methods of treatment have been discussed with the patient and family. After consideration of risks, benefits and other options for treatment, the patient has consented to  Procedure(s): CARDIOVERSION (N/A) as a surgical intervention.  The patient's history has been reviewed, patient examined, no change in status, stable for surgery.  I have reviewed the patient's chart and labs.  Questions were answered to the patient's satisfaction.     [name]

## 2023-12-29 NOTE — Transfer of Care (Signed)
 Immediate Anesthesia Transfer of Care Note  Patient: Paula [name]  Procedure(s) Performed: CARDIOVERSION  Patient Location: Cath Lab  Anesthesia Type:MAC  Level of Consciousness: drowsy and patient cooperative  Airway & Oxygen Therapy: Patient Spontanous Breathing and Patient connected to nasal cannula oxygen  Post-op Assessment: Report given to RN, Post -op Vital signs reviewed and stable, Patient moving all extremities, and Patient moving all extremities X 4  Post vital signs: Reviewed and stable  Last Vitals:  Vitals Value Taken Time  BP 109/63 12/29/23 0825  Temp    Pulse 61 12/29/23 0825  Resp 10 12/29/23 0825  SpO2 97 12/29/23 0825    Last Pain:  Vitals:   12/29/23 0740  TempSrc: Temporal  PainSc:          Complications: There were no known notable events for this encounter.

## 2023-12-29 NOTE — Progress Notes (Signed)
 Heart Failure Navigator Progress Note  Assessed for Heart & Vascular TOC clinic readiness.  Patient does not meet criteria due to has a scheduled [facility] appointment on 01/08/2024. No HF TOC. .   Navigator will sign off at this time.   Stephane Haddock, BSN, Scientist, Clinical (histocompatibility And Immunogenetics) Only

## 2023-12-29 NOTE — Progress Notes (Signed)
 PROGRESS NOTE  Paula [name] FMW:[id number] DOB: Mar 09, 1935 DOA: 12/26/2023 PCP: Charlott Dorn LABOR, MD   LOS: 3 days   Brief Narrative / Interim history: 88 year old female with history of A-fib on Multaq , pacemaker, hypertrophic cardiomyopathy, prior cardiac thrombus while on Eliquis  now on Coumadin , chronic hyponatremia, hypothyroidism who comes into the hospital with fluid overload directed from cardiology clinic.  She was recently hospitalized and discharged November 29, and at that time her 2D echocardiogram showed new onset RV systolic dysfunction with moderately elevated PA pressure, and her LVEF decreased to 40 And 45% from prior normal values.  She comes into the hospital with dyspnea on discharge, 10 pound weight gain and evidence of fluid overload.  Cardiology consulted  Subjective / 24h Interval events: Feeling back to baseline.  Asking about when she can go home.  She is scheduled for cardioversion this morning  Assesement and Plan: Principal problem Acute on chronic combined CHF -patient was admitted to the hospital with evidence of fluid overload.  Cardiology consulted and are evaluating, appreciate input.  Currently she is being diuresed with IV furosemide , continue per cardiology - Monitor strict ins and outs, daily weights.  She is at 126 pounds, was 142 on admission - Discussed with cardiology, just converted to sinus rhythm, they will need to figure out her home diuretics and antiarrhythmics.  Plan for perhaps home tomorrow if she remains in sinus  Active problems Hyponatremia-fairly chronic, normalized now that she is diuresed  PAF, presence of pacemaker -she is in A-fib, continue anticoagulation with Coumadin , continue metoprolol  for rate control.  Per cardiology, may attempt cardioversion on Monday once better diuresed - Continue dronedarone , cardiology discussed with the EP team about alternative antiarrhythmics  Essential hypertension-on metoprolol ,  Entresto , furosemide .  Will be switched to oral diuretics per cardiology today  Apical variant hypertrophic cardiomyopathy-noted  CKD 3A-baseline creatinine around 1.0-1.1, remains at baseline this morning  Hypothyroidism-continue Synthroid   Scheduled Meds:  dronedarone   400 mg Oral BID WC   furosemide   40 mg Intravenous BID   levothyroxine   125 mcg Oral QAC breakfast   magnesium  oxide  400 mg Oral Daily   metoprolol  succinate  25 mg Oral Daily   potassium chloride   20 mEq Oral Daily   sacubitril -valsartan   1 tablet Oral BID   warfarin  4 mg Oral q1600   Warfarin - Pharmacist Dosing Inpatient   Does not apply q1600   Continuous Infusions: PRN Meds:.acetaminophen  **OR** acetaminophen , ondansetron  **OR** ondansetron  (ZOFRAN ) IV, polyethylene glycol  Current Outpatient Medications  Medication Instructions   acetaminophen  (TYLENOL ) 650 mg, Every 4 hours PRN   Calcium  Carb-Cholecalciferol 600-5 MG-MCG TABS 1 tablet, Daily   diltiazem  (CARDIZEM  CD) 300 mg, Oral, Daily   dronedarone  (MULTAQ ) 400 mg, Oral, 2 times daily with meals   famotidine  (PEPCID  AC) 10 mg, Daily at bedtime   furosemide  (LASIX ) 20 mg, Oral, Daily   levothyroxine  (SYNTHROID ) 125 mcg, Daily before breakfast   Linzess  290 mcg, Oral, Daily before breakfast   metoprolol  succinate (TOPROL  XL) 25 mg, Oral, Daily, At night after dinner   Multiple Vitamin (MULTIVITAMIN WITH MINERALS) TABS tablet 1 tablet, Daily   polyethylene glycol (MIRALAX  / GLYCOLAX ) 17 g, Every evening   prazosin  (MINIPRESS ) 1 mg, Oral, Daily at bedtime   sodium chloride  (OCEAN) 0.65 % SOLN nasal spray 1 spray, As needed   triamcinolone cream (KENALOG) 0.1 % 1 Application, 2 times daily PRN   valsartan  (DIOVAN ) 160 mg, Oral, Daily   warfarin (COUMADIN ) 4 MG  tablet TAKE 1 TABLET BY MOUTH ONCE DAILY OR AS DIRECTED BY ANTICOAGULATION CLINIC    Diet Orders (From admission, onward)     Start     Ordered   12/29/23 1011  Diet Heart Room service  appropriate? Yes; Fluid consistency: Thin  Diet effective now       Question Answer Comment  Room service appropriate? Yes   Fluid consistency: Thin      12/29/23 1010            DVT prophylaxis: SCDs Start: 12/26/23 1733 warfarin (COUMADIN ) tablet 4 mg   Lab Results  Component Value Date   PLT 318 12/29/2023      Code Status: Full Code  Family Communication: No family at bedside  Status is: Inpatient Remains inpatient appropriate because: Ongoing IV diuresis   Level of care: Telemetry  Consultants:  Cardiology  Objective: Vitals:   12/29/23 0859 12/29/23 0918 12/29/23 0930 12/29/23 1002  BP: 111/65 121/72 (!) 126/92 121/72  Pulse:  61    Resp:  18    Temp:  (!) 97.5 F (36.4 C)    TempSrc:  Oral    SpO2:  100%    Weight:      Height:        Intake/Output Summary (Last 24 hours) at 12/29/2023 1028 Last data filed at 12/29/2023 9472 Gross per 24 hour  Intake 320 ml  Output 750 ml  Net -430 ml   Wt Readings from Last 3 Encounters:  12/29/23 57.6 kg  12/26/23 66 kg  12/20/23 59.4 kg    Examination: Constitutional: NAD Eyes: lids and conjunctivae normal, no scleral icterus ENMT: mmm Neck: normal, supple Respiratory: clear to auscultation bilaterally, no wheezing, no crackles. Normal respiratory effort.  Cardiovascular: Regular rate and rhythm, no murmurs / rubs / gallops. No LE edema. Abdomen: soft, no distention, no tenderness. Bowel sounds positive.   Data Reviewed: I have independently reviewed following labs and imaging studies   CBC Recent Labs  Lab 12/26/23 1109 12/28/23 0254 12/29/23 0215  WBC 6.1 6.3 7.4  HGB 11.2* 12.8 12.7  HCT 34.4* 39.0 38.7  PLT 296 295 318  MCV 88.9 85.2 85.6  MCH 28.9 27.9 28.1  MCHC 32.6 32.8 32.8  RDW 14.2 14.3 14.3    Recent Labs  Lab 12/26/23 1109 12/27/23 0229 12/27/23 1640 12/28/23 0254 12/29/23 0215  NA 133* 133*  --  135 135  K 4.2 3.6  --  4.0 4.1  CL 98 96*  --  94* 98  CO2 25 27  --   26 30  GLUCOSE 98 97  --  89 91  BUN 15 15  --  12 18  CREATININE 1.12* 1.02*  --  1.11* 1.13*  CALCIUM  8.4* 8.4*  --  8.4* 7.9*  AST  --   --   --  31  --   ALT  --   --   --  29  --   ALKPHOS  --   --   --  63  --   BILITOT  --   --   --  0.8  --   ALBUMIN  --   --   --  3.4*  --   MG 2.1 2.0  --  2.1 2.1  INR 2.1* 1.9* 2.0* 2.2* 2.2*  TSH 13.882*  --   --   --   --   BNP 763.0*  --   --   --   --     ------------------------------------------------------------------------------------------------------------------  No results for input(s): CHOL, HDL, LDLCALC, TRIG, CHOLHDL, LDLDIRECT in the last 72 hours.  No results found for: HGBA1C ------------------------------------------------------------------------------------------------------------------ Recent Labs    12/26/23 1109  TSH 13.882*    Cardiac Enzymes No results for input(s): CKMB, TROPONINI, MYOGLOBIN in the last 168 hours.  Invalid input(s): CK ------------------------------------------------------------------------------------------------------------------    Component Value Date/Time   BNP 763.0 (H) 12/26/2023 1109    CBG: No results for input(s): GLUCAP in the last 168 hours.  No results found for this or any previous visit (from the past 240 hours).    Radiology Studies: EP STUDY Result Date: 12/29/2023 See surgical note for result.  Nilda Fendt, MD, PhD [facility]  Between 7 am - 7 pm I am available, please contact me via Amion (for emergencies) or Securechat (non urgent messages)  Between 7 pm - 7 am I am not available, please contact night coverage MD/APP via Amion

## 2023-12-29 NOTE — TOC Initial Note (Signed)
 Transition of Care [facility]) - Initial/Assessment Note    Patient Details  Name: Paula [name] MRN: [id number] Date of Birth: 02-Dec-1935  Transition [facility]) CM/SW Contact:    Waddell Barnie Rama, RN Phone Number: 12/29/2023, 3:12 PM  Clinical Narrative:                 From home with spouse, has PCP and insurance on file, states has no HH services in place at this time or DME at home.  States family member (spouse)  will transport them home at [employer] and family is support system, states [facility] [place] [facility] [place] [facility].   Pta self ambulatory, has a walker but does not use it.    There are no ICM needs identified  at this time.  Please place consult for ICM needs.    Expected Discharge Plan: Home/Self Care Barriers to Discharge: Continued Medical Work up   Patient Goals and CMS Choice Patient states their goals for this hospitalization and ongoing recovery are:: return home with spouse   Choice offered to / list presented to : NA      Expected Discharge Plan and Services In-house Referral: NA Discharge Planning Services: CM Consult Post Acute Care Choice: NA Living arrangements for the past 2 months: Single Family Home                 DME Arranged: N/A DME Agency: NA       HH Arranged: NA          Prior Living Arrangements/Services Living arrangements for the past 2 months: Single Family Home Lives with:: Spouse Patient language and need for interpreter reviewed:: Yes Do you feel safe going back to the place where you live?: Yes      Need for Family Participation in Patient Care: Yes (Comment) Care giver support system in place?: Yes (comment)   Criminal Activity/Legal Involvement Pertinent to Current Situation/Hospitalization: No - Comment as needed  Activities of Daily Living   ADL Screening (condition at time of admission) Independently performs ADLs?: Yes (appropriate for developmental age) Is the patient deaf or have  difficulty hearing?: No Does the patient have difficulty seeing, even when wearing glasses/contacts?: No Does the patient have difficulty concentrating, remembering, or making decisions?: No  Permission Sought/Granted Permission sought to share information with : Case Manager Permission granted to share information with : Yes, Verbal Permission Granted              Emotional Assessment Appearance:: Appears stated age Attitude/Demeanor/Rapport: Engaged Affect (typically observed): Appropriate Orientation: : Oriented to Self, Oriented to Place, Oriented to  Time, Oriented to Situation Alcohol / Substance Use: Not Applicable Psych Involvement: No (comment)  Admission diagnosis:  SOB (shortness of breath) [R06.02] Acute right-sided CHF (congestive heart failure) (HCC) [I50.811] Acute on chronic congestive heart failure, unspecified heart failure type (HCC) [I50.9] Patient Active Problem List   Diagnosis Date Noted   Acute systolic heart failure (HCC) 12/29/2023   Chronic anticoagulation - on coumadin  for afib and prior hx of mural thrombus(while taking Eliquis ) 12/26/2023   Acute right-sided CHF (congestive heart failure) (HCC) 12/18/2023   Apical variant hypertrophic cardiomyopathy (HCC) 07/04/2022   Allergic rhinitis 07/04/2022   Arrhythmia 07/04/2022   Hardening of the aorta (main artery of the heart) 07/04/2022   Hypercholesterolemia 07/04/2022   Hypothyroidism 07/04/2022   Osteopenia 07/04/2022   Anticoagulation management encounter 04/11/2022   Constipation 03/19/2022   Hyponatremia 03/19/2022   Pacemaker  10/24/2020   Symptomatic bradycardia 07/15/2020   Essential hypertension 12/15/2019   Persistent atrial fibrillation (HCC) 08/31/2019   PAF (paroxysmal atrial fibrillation) (HCC) 05/11/2019   Secondary hypercoagulable state 05/11/2019   PCP:  Charlott Dorn LABOR, MD Pharmacy:   [phone] [place] [employer] - [address]  [place] [address] Phone: [phone] Fax: [phone]  [facility] - [place], [place] - [address] [place] [address] Phone: [phone] Fax: [phone]     Social Drivers of Health (SDOH) Social History: SDOH Screenings   Food Insecurity: No Food Insecurity (12/26/2023)  Housing: Low Risk  (12/27/2023)  Transportation Needs: No Transportation Needs (12/26/2023)  Utilities: Not At Risk (12/26/2023)  Social Connections: Socially Integrated (12/27/2023)  Tobacco Use: Low Risk  (12/26/2023)   SDOH Interventions:     Readmission Risk Interventions    12/29/2023    3:10 PM  Readmission Risk Prevention Plan  Transportation Screening Complete  Home Care Screening Complete  Medication Review (RN CM) Complete

## 2023-12-29 NOTE — Progress Notes (Deleted)
 Once cleared by cardiology

## 2023-12-29 NOTE — Progress Notes (Signed)
 Rounding Note   Patient Name: Paula [name] Date of Encounter: 12/29/2023  [facility] Cardiologist: [name], MD   Subjective Incomplete I/O's yesterday, recorded as net -330 cc, -8.1 L on admission.  Creatinine stable at 1.1.  Weight is down 18 pounds on admission.  She reports feeling significantly improved, states that dyspnea has resolved   Scheduled Meds:  dronedarone   400 mg Oral BID WC   furosemide   40 mg Intravenous BID   levothyroxine   125 mcg Oral QAC breakfast   magnesium  oxide  400 mg Oral Daily   metoprolol  succinate  25 mg Oral Daily   potassium chloride   20 mEq Oral Daily   sacubitril -valsartan   1 tablet Oral BID   warfarin  4 mg Oral q1600   Warfarin - Pharmacist Dosing Inpatient   Does not apply q1600   Continuous Infusions:  PRN Meds: acetaminophen  **OR** acetaminophen , ondansetron  **OR** ondansetron  (ZOFRAN ) IV, polyethylene glycol   Vital Signs  Vitals:   12/29/23 0859 12/29/23 0918 12/29/23 0930 12/29/23 1002  BP: 111/65 121/72 (!) 126/92 121/72  Pulse:  61    Resp:  18    Temp:  (!) 97.5 F (36.4 C)    TempSrc:  Oral    SpO2:  100%    Weight:      Height:        Intake/Output Summary (Last 24 hours) at 12/29/2023 1012 Last data filed at 12/29/2023 0527 Gross per 24 hour  Intake 320 ml  Output 750 ml  Net -430 ml      12/29/2023    4:25 AM 12/28/2023    5:00 AM 12/27/2023    4:02 AM  Last 3 Weights  Weight (lbs) 126 lb 14.4 oz 129 lb 3.2 oz 136 lb 4.8 oz  Weight (kg) 57.561 kg 58.605 kg 61.825 kg      Telemetry Atrial paced rhythm, rate 70s- Personally Reviewed  ECG  Atrial paced, rate 61, QTc 461- Personally Reviewed  Physical Exam  GEN: No acute distress.   Neck: No JVD Cardiac: RRR, no murmurs, rubs, or gallops.  Respiratory: Clear to auscultation bilaterally. GI: Soft, nontender, non-distended  MS: No edema; No deformity. Neuro:  Nonfocal  Psych: Normal affect   Labs High Sensitivity  Troponin:  No results for input(s): TROPONINIHS in the last 720 hours.   Chemistry Recent Labs  Lab 12/27/23 0229 12/28/23 0254 12/29/23 0215  NA 133* 135 135  K 3.6 4.0 4.1  CL 96* 94* 98  CO2 27 26 30   GLUCOSE 97 89 91  BUN 15 12 18   CREATININE 1.02* 1.11* 1.13*  CALCIUM  8.4* 8.4* 7.9*  MG 2.0 2.1 2.1  PROT  --  6.6  --   ALBUMIN  --  3.4*  --   AST  --  31  --   ALT  --  29  --   ALKPHOS  --  63  --   BILITOT  --  0.8  --   GFRNONAA 54* 48* 47*  ANIONGAP 10 15 7     Lipids No results for input(s): CHOL, TRIG, HDL, LABVLDL, LDLCALC, CHOLHDL in the last 168 hours.  Hematology Recent Labs  Lab 12/26/23 1109 12/28/23 0254 12/29/23 0215  WBC 6.1 6.3 7.4  RBC 3.87 4.58 4.52  HGB 11.2* 12.8 12.7  HCT 34.4* 39.0 38.7  MCV 88.9 85.2 85.6  MCH 28.9 27.9 28.1  MCHC 32.6 32.8 32.8  RDW 14.2 14.3 14.3  PLT 296 295 318  Thyroid   Recent Labs  Lab 12/26/23 1109  TSH 13.882*    BNP Recent Labs  Lab 12/26/23 1109  BNP 763.0*    DDimer No results for input(s): DDIMER in the last 168 hours.   Radiology  EP STUDY Result Date: 12/29/2023 See surgical note for result.   Cardiac Studies   Patient Profile   88 y.o. female ith a hx of paroxysmal atrial fibrillation, tachybrady syndrome status post PPM, hypertension, hypothyroidism who presents with chronic systolic heart failure   Acute systolic heart failure: Sent to ED from clinic on 12/5, weight was up 15 pounds in clinic and significantly volume overloaded on exam.  Echo with new systolic heart failure, EF 40 to 45%. Suspect her new systolic heart failure is due to persistent A-fib.  She is currently on Multaq  as amiodarone  was discontinued due to transaminitis.  Underwent successful cardioversion 12/8 - Has diuresed well, weight is down 18 pounds on admission.  Appears euvolemic, received IV Lasix  this morning, will transition to p.o. torsemide  tomorrow -Continue Entresto  24-26 mg daily and  Toprol -XL 25 mg daily  Persistent atrial fibrillation: Diagnosed 04/2019 after presenting to PCPs office with palpitations. CHA2DS2-VASc score 5.  Zio patch x7 days on 07/14/2019 showed AF burden 47%, with episode lasting 3 days and average rate 118 bpm.  She was admitted in June 2022 with near syncope, underwent PPM for tachybrady syndrome.  Now in persistent A-fib.   -Continue warfarin per pharmacy -Continue Toprol -XL 25 mg daily.  Previously on diltiazem , discontinued due to systolic heart failure as above -Was on amiodarone  200 mg daily.  Transaminitis noted on labs, dose reduced to 100 mg daily, then eventually discontinued.  She is currently on Multaq .  Follows with EP.  Suspect A-fib is etiology of new systolic heart failure as above, underwent cardioversion on 12/8, now back in sinus rhythm.  Multaq  likely not best option for patient given her new systolic heart failure, will discuss antiarrhythmic options with EP.  Would consider rechallenging with amiodarone  as her transaminitis was mild and could have also been due to her statin which was discontinued.  Alternatively Tikosyn  might be an option.  Given suspect her acute systolic heart failure is due to A-fib, would favor rhythm control strategy   Hypertrophic cardiomyopathy: cardiac MRI on 07/05/2019 showed LV apical hypertrophy measuring up to 15 mm, consistent with apical hypertrophic cardiomyopathy, small apical aneurysm, patchy apical LGE accounting for 1% of total myocardial mass.  No NSVT on Zio patch x 7 days.  Echo 02/11/2022 showed EF 60 to 65%, severe asymmetric LV apical hypertrophy, normal RV function, severe left atrial enlargement, moderate to severe mitral regurgitation, moderate to severe tricuspid regurgitation.  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  This was developed despite being on Eliquis , was switched to warfarin - Continue warfarin per pharmacy  Mitral regurgitation/tricuspid regurgitation: Moderate to severe MR and moderate TR on CMR 03/2022.  Echo 03/2023 showed moderate MR, severe TR. Given age and comorbidities, not a good candidate for transcatheter intervention   Hypertension: On valsartan  240 mg mg daily, prazosin  1 mg nightly, diltiazem  300 mg daily prior to admission.  Changed to Entresto  and Toprol -XL as above  Hyperlipidemia: Has been on rosuvastatin  5 mg daily, but held due to transaminitis.  LDL 124 on 12/04/2022   Transaminitis: elevation in liver enzymes noted.  Amiodarone  dose decreased to 100 mg daily and then eventually discontinued.  Rosuvastatin  discontinued.  Liver ultrasound unremarkable.  Referred to hepatology for evaluation.  Transaminitis has improved  For questions or updates, please contact [facility] Please consult [email or web address] for contact info under     Signed, [name], MD  12/29/2023, 10:12 AM

## 2023-12-29 NOTE — Anesthesia Postprocedure Evaluation (Signed)
 Anesthesia Post Note  Patient: Paula [name]  Procedure(s) Performed: CARDIOVERSION     Patient location during evaluation: Cath Lab Anesthesia Type: General Level of consciousness: awake and alert Pain management: pain level controlled Vital Signs Assessment: post-procedure vital signs reviewed and stable Respiratory status: spontaneous breathing, nonlabored ventilation, respiratory function stable and patient connected to nasal cannula oxygen Cardiovascular status: blood pressure returned to baseline and stable Postop Assessment: no apparent nausea or vomiting Anesthetic complications: no   There were no known notable events for this encounter.  Last Vitals:  Vitals:   12/29/23 1002 12/29/23 1210  BP: 121/72 116/66  Pulse:  (!) 110  Resp:  18  Temp:  36.5 C  SpO2:  90%    Last Pain:  Vitals:   12/29/23 1210  TempSrc: Oral  PainSc:                  Garnette DELENA Gab

## 2023-12-29 NOTE — Progress Notes (Signed)
 PHARMACY - ANTICOAGULATION CONSULT NOTE  Pharmacy Consult for warfarin Indication: atrial fibrillation and apical thrombus  Allergies  Allergen Reactions   Eliquis  [Apixaban ] Other (See Comments)    Doesn't Work for her. Developed a clot while taking it.   Wound Dressing Adhesive Rash    Patient Measurements: Height: 5' 2.5 (158.8 cm) Weight: 57.6 kg (126 lb 14.4 oz) IBW/kg (Calculated) : 51.25 HEPARIN  DW (KG): 64.3  Vital Signs: Temp: 97.5 F (36.4 C) (12/08 0918) Temp Source: Oral (12/08 0918) BP: 121/72 (12/08 0918) Pulse Rate: 61 (12/08 0918)  Labs: Recent Labs    12/26/23 1109 12/27/23 0229 12/27/23 1640 12/28/23 0254 12/29/23 0215  HGB 11.2*  --   --  12.8 12.7  HCT 34.4*  --   --  39.0 38.7  PLT 296  --   --  295 318  LABPROT 24.5* 22.7* 23.3* 25.7* 25.8*  INR 2.1* 1.9* 2.0* 2.2* 2.2*  CREATININE 1.12* 1.02*  --  1.11* 1.13*    Estimated Creatinine Clearance: 28.9 mL/min (A) (by C-G formula based on SCr of 1.13 mg/dL (H)).   Medical History: Past Medical History:  Diagnosis Date   Anemia    Atrial fibrillation (HCC)    CHF (congestive heart failure) (HCC)    DVT (deep venous thrombosis) (HCC)    in her heart   Hyperlipidemia    Hypertension    Hypothyroidism    Irregular heart rate    Thrombus in heart chamber 04/11/2022    Assessment: 88 YOF presenting with persistent dyspnea and volume overload, hx of afib and apical thrombus on warfarin PTA, INR low end therapeutic on admission at 2.1, last dose taken 12/4.   -INR= 2.2 (5mg  warfarin 12/6 and 6mg  warfarin 12/7 12/7 per cardiology to ensure INR > 2.0 today) -Noted s/p DCCV  PTA dosing: 4mg  daily  Goal of Therapy:  INR 2-3 Monitor platelets by anticoagulation protocol: Yes   Plan:  Warfarin 4 mg daily  Daily INR, s/s bleeding  Prentice Poisson, PharmD Clinical Pharmacist **Pharmacist phone directory can now be found on [email or web address] ([email or web address]).  Listed under [facility].

## 2023-12-29 NOTE — Consult Note (Addendum)
 ELECTROPHYSIOLOGY CONSULT NOTE    Patient ID: Akeylah Hendel MRN: [id number], DOB/AGE: [birth year] 88 y.o.  Admit date: 12/26/2023 Date of Consult: 12/29/2023  Primary Physician: Charlott Dorn LABOR, MD Primary Cardiologist: [name], MD  Electrophysiologist: Dr. Waddell   Referring Provider: Dr. Trixie / [facility]   Patient Profile: Omaira Mellen is a 88 y.o. female with a history of paroxysmal atrial fibrillation, tachy-brady syndrome s/p PPM, HFrEF, apical variant hypertrophic cardiomyopathy & hypothyroidism who is being seen today for the evaluation of atrial fibrillation at the request of Dr. Nanas.  HPI:  Annslee Tercero is a 88 y.o. female who was seen in Cardiology clinic on 12/26/23 for hospital follow up.    She was seen in ER on 12/11/23 for dyspnea and amiodarone  was changed to Cardizem  due to elevated LFT's.  She was recently admitted from 11/27-11/29/25 for acute on chronic systolic HF.  She followed up with Dr. Nanas in Cardiology Clinic ad was to be volume overloaded / decompensated HF in setting of AF.    She was re-admitted 12/26/23 for IV diuresis. The patients weight is down from 65.9kg > 57.6 kg.  She underwent cardioversion on 12/29/23 with 200 j x1 restoring NSR.   He denies chest pain, palpitations, dyspnea, PND, orthopnea, nausea, vomiting, dizziness, syncope, edema, weight gain, or early satiety.   Labs Potassium4.1 (12/08 0215) Magnesium   2.1 (12/08 0215) Creatinine, ser  1.13* (12/08 0215) PLT  318 (12/08 0215) HGB  12.7 (12/08 0215) WBC 7.4 (12/08 0215)  .    Past Medical History:  Diagnosis Date   Anemia    Atrial fibrillation (HCC)    CHF (congestive heart failure) (HCC)    DVT (deep venous thrombosis) (HCC)    in her heart   Hyperlipidemia    Hypertension    Hypothyroidism    Irregular heart rate    Thrombus in heart chamber 04/11/2022     Surgical History:  Past Surgical History:  Procedure  Laterality Date   APPENDECTOMY     in early 1970's   PACEMAKER IMPLANT N/A 07/17/2020   Procedure: PACEMAKER IMPLANT;  Surgeon: Waddell Danelle ORN, MD;  Location: [facility] INVASIVE CV LAB;  Service: Cardiovascular;  Laterality: N/A;   salvia gland  2012   Dr Carlie   VAGINAL HYSTERECTOMY     partial     Medications Prior to Admission  Medication Sig Dispense Refill Last Dose/Taking   acetaminophen  (TYLENOL ) 325 MG tablet Take 650 mg by mouth every 4 (four) hours as needed for moderate pain (pain score 4-6) or headache.   Past Week   Calcium  Carb-Cholecalciferol 600-5 MG-MCG TABS Take 1 tablet by mouth daily at 6 (six) AM.   12/25/2023 Evening   diltiazem  (CARDIZEM  CD) 300 MG 24 hr capsule TAKE 1 CAPSULE EVERY DAY 90 capsule 3 12/26/2023 Morning   dronedarone  (MULTAQ ) 400 MG tablet Take 1 tablet (400 mg total) by mouth 2 (two) times daily with a meal. 180 tablet 3 12/26/2023 Morning   famotidine  (PEPCID  AC) 10 MG tablet Take 10 mg by mouth at bedtime.   12/25/2023 Bedtime   furosemide  (LASIX ) 20 MG tablet Take 1 tablet (20 mg total) by mouth daily. 30 tablet 0 12/26/2023 Morning   levothyroxine  (SYNTHROID ) 125 MCG tablet Take 125 mcg by mouth daily before breakfast.   12/26/2023 Morning   linaclotide  (LINZESS ) 290 MCG CAPS capsule Take 1 capsule (290 mcg total) by mouth daily before breakfast. 30 capsule 0 12/26/2023 Morning  metoprolol  succinate (TOPROL  XL) 25 MG 24 hr tablet Take 1 tablet (25 mg total) by mouth daily. At night after dinner 90 tablet 3 12/25/2023 Evening   Multiple Vitamin (MULTIVITAMIN WITH MINERALS) TABS tablet Take 1 tablet by mouth daily.   12/25/2023 Evening   polyethylene glycol (MIRALAX  / GLYCOLAX ) 17 g packet Take 17 g by mouth every evening.   12/25/2023 Evening   sodium chloride  (OCEAN) 0.65 % SOLN nasal spray Place 1 spray into both nostrils as needed for congestion.   Past Month   triamcinolone cream (KENALOG) 0.1 % Apply 1 Application topically 2 (two) times daily as needed  (itching).   Past Month   valsartan  (DIOVAN ) 160 MG tablet Take 1 tablet (160 mg total) by mouth daily. (Patient taking differently: Take 160 mg by mouth every evening.) 90 tablet 3 12/25/2023 Evening   warfarin (COUMADIN ) 4 MG tablet TAKE 1 TABLET BY MOUTH ONCE DAILY OR AS DIRECTED BY ANTICOAGULATION CLINIC (Patient taking differently: Take 4 mg by mouth daily at 4 PM. Take 1 tablet by mouth once daily or as directed by anticoagulation clinic) 90 tablet 3 12/25/2023 at  4:00 PM   prazosin  (MINIPRESS ) 1 MG capsule TAKE 1 CAPSULE AT BEDTIME (Patient not taking: Reported on 12/26/2023) 90 capsule 3 Not Taking    Inpatient Medications:   dronedarone   400 mg Oral BID WC   [START ON 12/30/2023] empagliflozin   10 mg Oral Daily   levothyroxine   125 mcg Oral QAC breakfast   magnesium  oxide  400 mg Oral Daily   metoprolol  succinate  25 mg Oral Daily   potassium chloride   20 mEq Oral Daily   sacubitril -valsartan   1 tablet Oral BID   [START ON 12/30/2023] torsemide   20 mg Oral Daily   warfarin  4 mg Oral q1600   Warfarin - Pharmacist Dosing Inpatient   Does not apply q1600    Allergies:  Allergies  Allergen Reactions   Eliquis  [Apixaban ] Other (See Comments)    Doesn't Work for her. Developed a clot while taking it.   Wound Dressing Adhesive Rash    Family History  Problem Relation Age of Onset   Other Mother        heart attack or aneursym   Cancer Father        blood cancer   Breast cancer Sister 91   Colon cancer Neg Hx    Esophageal cancer Neg Hx    Rectal cancer Neg Hx      Physical Exam: Vitals:   12/29/23 0918 12/29/23 0930 12/29/23 1002 12/29/23 1210  BP: 121/72 (!) 126/92 121/72 116/66  Pulse: 61   (!) 110  Resp: 18   18  Temp: (!) 97.5 F (36.4 C)   97.7 F (36.5 C)  TempSrc: Oral   Oral  SpO2: 100%   90%  Weight:      Height:        GEN- NAD, A&O x 3, normal affect HEENT: Normocephalic, atraumatic Lungs- CTAB, Normal effort.  Heart- Regular rate and rhythm, No  M/G/R.  GI- Soft, NT, ND.  Extremities- No clubbing, cyanosis, or edema   Radiology/Studies: EP STUDY Result Date: 12/29/2023 See surgical note for result.  DG Chest 2 View Result Date: 12/26/2023 CLINICAL DATA:  Chest pain EXAM: CHEST - 2 VIEW COMPARISON:  Prior chest x-ray 12/18/2023 FINDINGS: Left subclavian approach cardiac rhythm maintenance device remains in unchanged position. Mild cardiomegaly, stable. Atherosclerotic calcifications in the transverse aorta. Diffuse mild chronic bronchitic changes. No focal  airspace infiltrate, pleural effusion, pulmonary edema or pneumothorax. Right basilar pleuroparenchymal scarring. No acute osseous abnormality. IMPRESSION: No active cardiopulmonary disease. Electronically Signed   By: Wilkie Lent M.D.   On: 12/26/2023 11:53   ECHOCARDIOGRAM COMPLETE Result Date: 12/19/2023    ECHOCARDIOGRAM REPORT   Patient Name:   JEANAE WHITMILL Date of Exam: 12/19/2023 Medical Rec #:  [id number]              Height:       62.0 in Accession #:    [id number]             Weight:       139.8 lb Date of Birth:  1935-08-23             BSA:          1.642 m Patient Age:    88 years               BP:           100/69 mmHg Patient Gender: F                      HR:           71 bpm. Exam Location:  Inpatient Procedure: 2D Echo, Cardiac Doppler, Color Doppler and Strain Analysis (Both            Spectral and Color Flow Doppler were utilized during procedure). Indications:    CHF  History:        Patient has prior history of Echocardiogram examinations, most                 recent 04/03/2023. CHF, Pacemaker; Risk Factors:Hypertension and                 Dyslipidemia.  Sonographer:    Philomena Daring Referring Phys: [id number] EKTA V PATEL IMPRESSIONS  1. Left ventricular ejection fraction, by estimation, is 40 to 45%. The left ventricle has mildly decreased function. The left ventricle demonstrates global hypokinesis. There is severe asymmetric left ventricular hypertrophy of the  apical segment. Diastology indeterminate due to atrial fibrillation.  2. Right ventricular systolic function is mildly reduced. The right ventricular size is normal. There is moderately elevated pulmonary artery systolic pressure.  3. Left atrial size was moderately dilated.  4. Right atrial size was severely dilated.  5. There is no evidence of cardiac tamponade.  6. The mitral valve is degenerative. Moderate mitral valve regurgitation. No evidence of mitral stenosis.  7. The tricuspid valve is degenerative. Tricuspid valve regurgitation is severe.  8. The aortic valve is tricuspid. Aortic valve regurgitation is not visualized. No aortic stenosis is present.  9. The inferior vena cava is dilated in size with <50% respiratory variability, suggesting right atrial pressure of 15 mmHg. Comparison(s): Changes from prior study are noted. New mildly reduced LV and RV systolic function. FINDINGS  Left Ventricle: Left ventricular ejection fraction, by estimation, is 40 to 45%. The left ventricle has mildly decreased function. The left ventricle demonstrates global hypokinesis. The left ventricular internal cavity size was normal in size. There is  severe asymmetric left ventricular hypertrophy of the apical segment. Abnormal (paradoxical) septal motion, consistent with left bundle branch block. Diastology indeterminate due to atrial fibrillation. Right Ventricle: The right ventricular size is normal. No increase in right ventricular wall thickness. Right ventricular systolic function is mildly reduced. There is moderately elevated pulmonary artery systolic pressure. The tricuspid regurgitant velocity is 3.07 m/s,  and with an assumed right atrial pressure of 15 mmHg, the estimated right ventricular systolic pressure is 52.7 mmHg. Left Atrium: Left atrial size was moderately dilated. Right Atrium: Right atrial size was severely dilated. Pericardium: Trivial pericardial effusion is present. There is no evidence of cardiac  tamponade. Mitral Valve: The mitral valve is degenerative in appearance. There is mild thickening of the mitral valve leaflet(s). Moderate mitral valve regurgitation. No evidence of mitral valve stenosis. Tricuspid Valve: The tricuspid valve is degenerative in appearance. Tricuspid valve regurgitation is severe. No evidence of tricuspid stenosis. Aortic Valve: The aortic valve is tricuspid. Aortic valve regurgitation is not visualized. No aortic stenosis is present. Aortic valve mean gradient measures 5.0 mmHg. Aortic valve peak gradient measures 8.8 mmHg. Aortic valve area, by VTI measures 1.30 cm. Pulmonic Valve: The pulmonic valve was normal in structure. Pulmonic valve regurgitation is trivial. No evidence of pulmonic stenosis. Aorta: The aortic root and ascending aorta are structurally normal, with no evidence of dilitation. Venous: The inferior vena cava is dilated in size with less than 50% respiratory variability, suggesting right atrial pressure of 15 mmHg. IAS/Shunts: No atrial level shunt detected by color flow Doppler. Additional Comments: A device lead is visualized.  LEFT VENTRICLE PLAX 2D LVIDd:         4.30 cm   Diastology LVIDs:         3.00 cm   LV e' medial:    6.31 cm/s LV PW:         0.80 cm   LV E/e' medial:  17.7 LV IVS:        1.10 cm   LV e' lateral:   8.81 cm/s LVOT diam:     2.00 cm   LV E/e' lateral: 12.7 LV SV:         37 LV SV Index:   23 LVOT Area:     3.14 cm  RIGHT VENTRICLE            IVC RV S prime:     7.40 cm/s  IVC diam: 2.70 cm TAPSE (M-mode): 1.5 cm LEFT ATRIUM             Index        RIGHT ATRIUM           Index LA diam:        4.30 cm 2.62 cm/m   RA Area:     24.20 cm LA Vol (A2C):   69.4 ml 42.27 ml/m  RA Volume:   77.70 ml  47.33 ml/m LA Vol (A4C):   74.3 ml 45.26 ml/m LA Biplane Vol: 74.1 ml 45.14 ml/m  AORTIC VALVE AV Area (Vmax):    1.66 cm AV Area (Vmean):   1.69 cm AV Area (VTI):     1.30 cm AV Vmax:           148.00 cm/s AV Vmean:          99.500 cm/s AV  VTI:            0.286 m AV Peak Grad:      8.8 mmHg AV Mean Grad:      5.0 mmHg LVOT Vmax:         78.10 cm/s LVOT Vmean:        53.500 cm/s LVOT VTI:          0.118 m LVOT/AV VTI ratio: 0.41  AORTA Ao Root diam: 2.70 cm Ao Asc diam:  3.20 cm MITRAL VALVE  TRICUSPID VALVE MV Area (PHT): 3.11 cm     TR Peak grad:   37.7 mmHg MV Decel Time: 244 msec     TR Vmax:        307.00 cm/s MV E velocity: 112.00 cm/s                             SHUNTS                             Systemic VTI:  0.12 m                             Systemic Diam: 2.00 cm Georganna Archer Electronically signed by Georganna Archer Signature Date/Time: 12/19/2023/1:58:05 PM    Final    DG Chest Port 1 View Result Date: 12/18/2023 CLINICAL DATA:  Shortness of breath EXAM: PORTABLE CHEST 1 VIEW COMPARISON:  12/11/2023 FINDINGS: Single frontal view of the chest demonstrates stable dual lead pacemaker. Cardiac silhouette is enlarged but stable. No acute airspace disease, effusion, or pneumothorax. No acute bony abnormalities. IMPRESSION: 1. Stable chest, no acute process. Electronically Signed   By: Ozell Daring M.D.   On: 12/18/2023 15:22   DG Chest 2 View Result Date: 12/11/2023 CLINICAL DATA:  Facial swelling and shortness of breath and cough. EXAM: CHEST - 2 VIEW COMPARISON:  01/16/2022 FINDINGS: The pacer wires are stable. No complicating features. The heart is borderline enlarged but stable. Stable tortuosity and calcification of the thoracic aorta. The lungs are clear of an acute process. No infiltrates, edema or effusions. Mild chronic left pleural thickening. No pulmonary lesions or pneumothorax. IMPRESSION: No acute cardiopulmonary findings. Electronically Signed   By: MYRTIS Stammer M.D.   On: 12/11/2023 14:36   CUP PACEART INCLINIC DEVICE CHECK Result Date: 12/01/2023 Full device check not complete. Presenting rhythm AF/VS 80-120. AT/AF burden 13%. On Warfarin per EPIC. Multiple AMS episodes noted c/w AFL/AF. Rates  uncontrolled at times. Dr. Waddell attempt Atrial NIPS during OV. Unsuccessful. Programming Changes: - PVAB programmed from to 70ms d/t undersensing AF at times.Rozelle Banter, BSN, RN   EKG:12/29/23 > AP with prolonged AV delay, 61 bpm (personally reviewed)  TELEMETRY: AF 90-120's > AP 60's, SR 60-80's (personally reviewed)  DEVICE HISTORY:  Abbott Dual Chamber PPM implanted 07/17/20 for SSS / bradycardia    Arrhythmia / AAD / Pertinent EP Studies AF > initial dx 04/2019 by PCP (palpitations)  ZIO 07/14/19 > 47% AF burden, frequent SVT episodes Near Syncope 06/2019 > PPM insertion for tachybrady Amiodarone  > failed due to elevated LFT's  Drodedarone > breakthrough AF, stopped 12/2023  DCCV 12/29/23 > 200j x1 with conversion to NSR   Assessment/Plan:  Atrial Fibrillation with RVR  CHA2DS2-VASc of at least 4, d/p DCCV 12/29/23 200j x1 > NSR . LVEF 11/2023 40-45%. -stop dronedarone  with low EF / recurrent AF, allow for wash out  -Katerine Morua need patient to be transferred to 6E for Tikosyn  loading, anticipate first dose may be 12/9 PM pending review with pharmacy regarding half life  -anticipate 125 mcg BID dosing given CrCl 28-32 mL/min -tele monitoring   Secondary Hypercoagulable State  -continue Coumadin  for stroke prophylaxis   Tachy-Brady Syndrome, SSS s/p PPM  -intermittent AP noted on monitor   Apical Variant Hypertrophic Cardiomyopathy  Acute Systolic Heart Failure  LV Mural Thrombus  VHD: TV / MV  Insufficiency  -per Cardiology  -down 8L since admit / from 65 kgs > 57 kgs     For questions or updates, please contact [facility] Please consult [email or web address] for contact info under     Signed, Daphne Barrack, NP-C, AGACNP-BC [facility] - Electrophysiology  12/29/2023, 3:49 PM     [name] was seen by me today along with Daphne Barrack. I have personally performed an evaluation on this patient.  My findings are as follows: 88 y.o.  female patient has a past medical history as above.  She was seen in the emergency room 12/01/2023 with dyspnea.  Amiodarone  was stopped and changed to Cardizem  due to elevated LFTs.  She was again admitted 12/18/2023 with acute on chronic systolic heart failure.  She was readmitted 12/26/2023 for IV diuresis.  She is net -8 L.  She was noted to be in atrial fibrillation at the time and was cardioverted today.  Currently she feels well.  She has no acute shortness of breath and no cardiac awareness..  Data: EKG(s) and pertinent labs, studies, etc were personally reviewed and interpreted by me:  EKG, telemetry, labs Otherwise, I agree with data as outlined by the advanced practice provider.  Exam performed by me: Gen: No acute distress Neck: no JVD Cardiac: Regular Lungs: Normal work of breathing Extremities: No edema  My Assessment and Plan: 1.  Persistent atrial fibrillation: Post cardioversion today.  She was previously on Multaq .  She was previously on amiodarone  but this was stopped due to elevated LFTs.  Kamariah Fruchter likely need to try Tikosyn .  Clotine Heiner check on the stop date of amiodarone  to see if we can start dofetilide  this admission.  2.  Second hypercoagulable state: Continue Coumadin   3.  Tachybradycardia syndrome: Post pacemaker  Signed,  Layne Lebon Gladis Norton, MD  12/29/2023 4:38 PM

## 2023-12-30 ENCOUNTER — Telehealth (HOSPITAL_COMMUNITY): Payer: Self-pay

## 2023-12-30 ENCOUNTER — Other Ambulatory Visit (HOSPITAL_COMMUNITY): Payer: Self-pay

## 2023-12-30 ENCOUNTER — Encounter (HOSPITAL_COMMUNITY): Payer: Self-pay | Admitting: Cardiology

## 2023-12-30 LAB — CBC
HCT: 38.7 % (ref 36.0–46.0)
Hemoglobin: 12.9 g/dL (ref 12.0–15.0)
MCH: 28.9 pg (ref 26.0–34.0)
MCHC: 33.3 g/dL (ref 30.0–36.0)
MCV: 86.6 fL (ref 80.0–100.0)
Platelets: 310 K/uL (ref 150–400)
RBC: 4.47 MIL/uL (ref 3.87–5.11)
RDW: 14.1 % (ref 11.5–15.5)
WBC: 6.7 K/uL (ref 4.0–10.5)
nRBC: 0 % (ref 0.0–0.2)

## 2023-12-30 LAB — BASIC METABOLIC PANEL WITH GFR
Anion gap: 15 (ref 5–15)
Anion gap: 9 (ref 5–15)
BUN: 18 mg/dL (ref 8–23)
BUN: 19 mg/dL (ref 8–23)
CO2: 19 mmol/L — ABNORMAL LOW (ref 22–32)
CO2: 28 mmol/L (ref 22–32)
Calcium: 8 mg/dL — ABNORMAL LOW (ref 8.9–10.3)
Calcium: 8.2 mg/dL — ABNORMAL LOW (ref 8.9–10.3)
Chloride: 98 mmol/L (ref 98–111)
Chloride: 97 mmol/L — ABNORMAL LOW (ref 98–111)
Creatinine, Ser: 0.99 mg/dL (ref 0.44–1.00)
Creatinine, Ser: 1.4 mg/dL — ABNORMAL HIGH (ref 0.44–1.00)
GFR, Estimated: 56 mL/min — ABNORMAL LOW (ref >60)
GFR, Estimated: 37 mL/min — ABNORMAL LOW (ref >60)
Glucose, Bld: 78 mg/dL (ref 70–99)
Glucose, Bld: 116 mg/dL — ABNORMAL HIGH (ref 70–99)
Potassium: 3.9 mmol/L (ref 3.5–5.1)
Potassium: 4.4 mmol/L (ref 3.5–5.1)
Sodium: 132 mmol/L — ABNORMAL LOW (ref 135–145)
Sodium: 134 mmol/L — ABNORMAL LOW (ref 135–145)

## 2023-12-30 LAB — MAGNESIUM
Magnesium: 2.2 mg/dL (ref 1.7–2.4)
Magnesium: 2.4 mg/dL (ref 1.7–2.4)

## 2023-12-30 LAB — PROTIME-INR
INR: 2.4 — ABNORMAL HIGH (ref 0.8–1.2)
Prothrombin Time: 27.2 s — ABNORMAL HIGH (ref 11.4–15.2)

## 2023-12-30 MED ORDER — LEVOTHYROXINE SODIUM 50 MCG PO TABS
137.0000 ug | ORAL_TABLET | Freq: Every day | ORAL | Status: DC
  Administered 2023-12-31 – 2024-01-02 (×3): 137 ug via ORAL
  Filled 2023-12-30 (×3): qty 1

## 2023-12-30 MED ORDER — POTASSIUM CHLORIDE 20 MEQ PO PACK
20.0000 meq | PACK | Freq: Once | ORAL | Status: DC
  Filled 2023-12-30: qty 1

## 2023-12-30 MED ORDER — DOFETILIDE 125 MCG PO CAPS
125.0000 ug | ORAL_CAPSULE | Freq: Two times a day (BID) | ORAL | Status: DC
  Administered 2023-12-30 – 2024-01-01 (×4): 125 ug via ORAL
  Filled 2023-12-30 (×6): qty 1

## 2023-12-30 MED ORDER — POTASSIUM CHLORIDE CRYS ER 20 MEQ PO TBCR
20.0000 meq | EXTENDED_RELEASE_TABLET | Freq: Once | ORAL | Status: AC
  Administered 2023-12-30: 20 meq via ORAL
  Filled 2023-12-30: qty 1

## 2023-12-30 NOTE — Progress Notes (Signed)
 PROGRESS NOTE  Paula [name] FMW:[id number] DOB: 1935-03-14 DOA: 12/26/2023 PCP: Charlott Dorn LABOR, MD   LOS: 4 days   Brief Narrative / Interim history: 88 year old female with history of A-fib on Multaq , pacemaker, hypertrophic cardiomyopathy, prior cardiac thrombus while on Eliquis  now on Coumadin , chronic hyponatremia, hypothyroidism who comes into the hospital with fluid overload directed from cardiology clinic.  She was recently hospitalized and discharged November 29, and at that time her 2D echocardiogram showed new onset RV systolic dysfunction with moderately elevated PA pressure, and her LVEF decreased to 40 And 45% from prior normal values.  She comes into the hospital with dyspnea on discharge, 10 pound weight gain and evidence of fluid overload.  Cardiology consulted  Subjective / 24h Interval events: She is feeling well this morning, denies any chest pain, denies any palpitations.  She is breathing well.  Assesement and Plan: Principal problem Acute on chronic combined CHF -patient was admitted to the hospital with evidence of fluid overload.  This is likely in the setting of poorly controlled A-fib.  Cardiology consulted and are evaluating, appreciate input.  Has been diuresed with IV furosemide , now transition to torsemide  since she is euvolemic - Monitor strict ins and outs, daily weights.  She is at 125 pounds, was 142 on admission  Active problems PAF, presence of pacemaker -she came into the hospital in A-fib, continue anticoagulation with Coumadin .  Has been placed on metoprolol , continue.  She used to be on amiodarone , and this was converted to dronedarone  as an outpatient.  For her, cardiology is trying to maintain sinus rhythm. - EP consulted, they plan to start dofetilide  tonight  Hypothyroidism -patient apparently has been stable on 88 mcg of levothyroxine  for few decades, however about 3 to 4 months ago was found to have an elevated TSH and levothyroxine   was increased to 100.  Recheck TSH 6 weeks later showed a persistently elevated TSH and it was further increased to 125.  Another recheck 6 weeks later showed again a persistently elevated TSH, currently her levothyroxine  was increased to 137 - This is extremely unusual given prior long-term stability on 88 mcg.  Patient is taking this medication correctly and has been doing so for a while.  There have been some reports about HAMA (human anti-mouse antibody) which can develop at any time, and this interferes with TSH assay causing a falsely elevated TSH. HAMA prevalence in general population ranges from approximately 8% to 12%. - As an outpatient, recommend checking TSH along with free T4 to ensure that the free T4 does not become supratherapeutic, potentially changing the assay to which the TSH is checked.  Alternatively, if possible, checking a TSH with adding heterophile antibody blocking reagents. Ultimately defer to outpatient PCP, consider referral to endocrinology - this is unlikely to be caused by amiodarone  since her thyroid  is nonfunctional  Hyponatremia-fairly chronic, normalized yesterday but down to 132 today, overall no large shifts noted  Essential hypertension-on metoprolol , Entresto , torsemide   Apical variant hypertrophic cardiomyopathy-noted  CKD 3A-baseline creatinine around 1.0-1.1, remains at baseline today  Scheduled Meds:  empagliflozin   10 mg Oral Daily   [START ON 12/31/2023] levothyroxine   137 mcg Oral Q0600   magnesium  oxide  400 mg Oral Daily   metoprolol  succinate  25 mg Oral Daily   potassium chloride   20 mEq Oral Once   potassium chloride   20 mEq Oral Daily   sacubitril -valsartan   1 tablet Oral BID   torsemide   20 mg Oral Daily   warfarin  4 mg Oral q1600   Warfarin - Pharmacist Dosing Inpatient   Does not apply q1600   Continuous Infusions: PRN Meds:.acetaminophen  **OR** acetaminophen , ondansetron  **OR** ondansetron  (ZOFRAN ) IV, polyethylene glycol  Current  Outpatient Medications  Medication Instructions   acetaminophen  (TYLENOL ) 650 mg, Every 4 hours PRN   Calcium  Carb-Cholecalciferol 600-5 MG-MCG TABS 1 tablet, Daily   diltiazem  (CARDIZEM  CD) 300 mg, Oral, Daily   dronedarone  (MULTAQ ) 400 mg, Oral, 2 times daily with meals   famotidine  (PEPCID  AC) 10 mg, Daily at bedtime   furosemide  (LASIX ) 20 mg, Oral, Daily   levothyroxine  (SYNTHROID ) 125 mcg, Daily before breakfast   Linzess  290 mcg, Oral, Daily before breakfast   metoprolol  succinate (TOPROL  XL) 25 mg, Oral, Daily, At night after dinner   Multiple Vitamin (MULTIVITAMIN WITH MINERALS) TABS tablet 1 tablet, Daily   polyethylene glycol (MIRALAX  / GLYCOLAX ) 17 g, Every evening   prazosin  (MINIPRESS ) 1 mg, Oral, Daily at bedtime   sodium chloride  (OCEAN) 0.65 % SOLN nasal spray 1 spray, As needed   triamcinolone cream (KENALOG) 0.1 % 1 Application, 2 times daily PRN   valsartan  (DIOVAN ) 160 mg, Oral, Daily   warfarin (COUMADIN ) 4 MG tablet TAKE 1 TABLET BY MOUTH ONCE DAILY OR AS DIRECTED BY ANTICOAGULATION CLINIC    Diet Orders (From admission, onward)     Start     Ordered   12/29/23 1011  Diet Heart Room service appropriate? Yes; Fluid consistency: Thin  Diet effective now       Question Answer Comment  Room service appropriate? Yes   Fluid consistency: Thin      12/29/23 1010            DVT prophylaxis: SCDs Start: 12/26/23 1733 warfarin (COUMADIN ) tablet 4 mg   Lab Results  Component Value Date   PLT 310 12/30/2023      Code Status: Full Code  Family Communication: No family at bedside  Status is: Inpatient Remains inpatient appropriate because: Ongoing IV diuresis   Level of care: Telemetry  Consultants:  Cardiology  Objective: Vitals:   12/29/23 1625 12/29/23 1926 12/30/23 0100 12/30/23 0433  BP: 134/69 115/64 (!) 147/71 (!) 144/99  Pulse:  61 (!) 59 65  Resp: 18 18 18 18   Temp: 97.6 F (36.4 C) 97.8 F (36.6 C) 98.2 F (36.8 C) 98.7 F (37.1 C)   TempSrc: Oral Oral Oral Oral  SpO2: 95% 100% 99% 100%  Weight:    57 kg  Height:        Intake/Output Summary (Last 24 hours) at 12/30/2023 1013 Last data filed at 12/30/2023 0900 Gross per 24 hour  Intake 600 ml  Output 700 ml  Net -100 ml   Wt Readings from Last 3 Encounters:  12/30/23 57 kg  12/26/23 66 kg  12/20/23 59.4 kg    Examination: Constitutional: NAD Eyes: lids and conjunctivae normal, no scleral icterus ENMT: mmm Neck: normal, supple Respiratory: clear to auscultation bilaterally, no wheezing, no crackles. Normal respiratory effort.  Cardiovascular: Regular rate and rhythm, no murmurs / rubs / gallops. No LE edema. Abdomen: soft, no distention, no tenderness. Bowel sounds positive.   Data Reviewed: I have independently reviewed following labs and imaging studies   CBC Recent Labs  Lab 12/26/23 1109 12/28/23 0254 12/29/23 0215 12/30/23 0255  WBC 6.1 6.3 7.4 6.7  HGB 11.2* 12.8 12.7 12.9  HCT 34.4* 39.0 38.7 38.7  PLT 296 295 318 310  MCV 88.9 85.2 85.6 86.6  MCH 28.9  27.9 28.1 28.9  MCHC 32.6 32.8 32.8 33.3  RDW 14.2 14.3 14.3 14.1    Recent Labs  Lab 12/26/23 1109 12/27/23 0229 12/27/23 1640 12/28/23 0254 12/29/23 0215 12/30/23 0255  NA 133* 133*  --  135 135 132*  K 4.2 3.6  --  4.0 4.1 3.9  CL 98 96*  --  94* 98 98  CO2 25 27  --  26 30 19*  GLUCOSE 98 97  --  89 91 78  BUN 15 15  --  12 18 18   CREATININE 1.12* 1.02*  --  1.11* 1.13* 0.99  CALCIUM  8.4* 8.4*  --  8.4* 7.9* 8.0*  AST  --   --   --  31  --   --   ALT  --   --   --  29  --   --   ALKPHOS  --   --   --  63  --   --   BILITOT  --   --   --  0.8  --   --   ALBUMIN  --   --   --  3.4*  --   --   MG 2.1 2.0  --  2.1 2.1 2.2  INR 2.1* 1.9* 2.0* 2.2* 2.2* 2.4*  TSH 13.882*  --   --   --   --   --   BNP 763.0*  --   --   --   --   --     ------------------------------------------------------------------------------------------------------------------ No results for  input(s): CHOL, HDL, LDLCALC, TRIG, CHOLHDL, LDLDIRECT in the last 72 hours.  No results found for: HGBA1C ------------------------------------------------------------------------------------------------------------------ No results for input(s): TSH, T4TOTAL, T3FREE, THYROIDAB in the last 72 hours.  Invalid input(s): FREET3   Cardiac Enzymes No results for input(s): CKMB, TROPONINI, MYOGLOBIN in the last 168 hours.  Invalid input(s): CK ------------------------------------------------------------------------------------------------------------------    Component Value Date/Time   BNP 763.0 (H) 12/26/2023 1109    CBG: No results for input(s): GLUCAP in the last 168 hours.  No results found for this or any previous visit (from the past 240 hours).    Radiology Studies: No results found.  Nilda Fendt, MD, PhD [facility]  Between 7 am - 7 pm I am available, please contact me via Amion (for emergencies) or Securechat (non urgent messages)  Between 7 pm - 7 am I am not available, please contact night coverage MD/APP via Amion

## 2023-12-30 NOTE — Discharge Instructions (Addendum)
 Follow with Charlott Dorn LABOR, MD in 5-7 days  Dr Charlott, please consider checking and monitoring TSH along with free T4 to ensure that the free T4 does not become supratherapeutic. Alternatively, one can see if the assay to which the TSH is checked can be changed. If possible, consider checking a TSH with adding heterophile antibody blocking reagents (to block the HAMA, if this is the reason)  Please get a complete blood count and chemistry panel checked by your Primary MD at your next visit, and again as instructed by your Primary MD. Please get your medications reviewed and adjusted by your Primary MD.  Please request your Primary MD to go over all Hospital Tests and Procedure/Radiological results at the follow up, please get all Hospital records sent to your Prim MD by signing hospital release before you go home.  In some cases, there will be blood work, cultures and biopsy results pending at the time of your discharge. Please request that your primary care M.D. goes through all the records of your hospital data and follows up on these results.  If you had Pneumonia of Lung problems at the Hospital: Please get a 2 view Chest X ray done in 6-8 weeks after hospital discharge or sooner if instructed by your Primary MD.  If you have Congestive Heart Failure: Please call your Cardiologist or Primary MD anytime you have any of the following symptoms:  1) 3 pound weight gain in 24 hours or 5 pounds in 1 week  2) shortness of breath, with or without a dry hacking cough  3) swelling in the hands, feet or stomach  4) if you have to sleep on extra pillows at night in order to breathe  Follow cardiac low salt diet and 1.5 lit/day fluid restriction.  If you have diabetes Accuchecks 4 times/day, Once in AM empty stomach and then before each meal. Log in all results and show them to your primary doctor at your next visit. If any glucose reading is under 80 or above 300 call your primary MD  immediately.  If you have Seizure/Convulsions/Epilepsy: Please do not drive, operate heavy machinery, participate in activities at heights or participate in high speed sports until you have seen by Primary MD or a Neurologist and advised to do so again. Per [place] statutes, patients with seizures are not allowed to drive until they have been seizure-free for six months.  Use caution when using heavy equipment or power tools. Avoid working on ladders or at heights. Take showers instead of baths. Ensure the water temperature is not too high on the home water heater. Do not go swimming alone. Do not lock yourself in a room alone (i.e. bathroom). When caring for infants or small children, sit down when holding, feeding, or changing them to minimize risk of injury to the child in the event you have a seizure. Maintain good sleep hygiene. Avoid alcohol.   If you had Gastrointestinal Bleeding: Please ask your Primary MD to check a complete blood count within one week of discharge or at your next visit. Your endoscopic/colonoscopic biopsies that are pending at the time of discharge, will also need to followed by your Primary MD.  Get Medicines reviewed and adjusted. Please take all your medications with you for your next visit with your Primary MD  Please request your Primary MD to go over all hospital tests and procedure/radiological results at the follow up, please ask your Primary MD to get all Hospital records sent to  his/her office.  If you experience worsening of your admission symptoms, develop shortness of breath, life threatening emergency, suicidal or homicidal thoughts you must seek medical attention immediately by calling 911 or calling your MD immediately  if symptoms less severe.  You must read complete instructions/literature along with all the possible adverse reactions/side effects for all the Medicines you take and that have been prescribed to you. Take any new Medicines after  you have completely understood and accpet all the possible adverse reactions/side effects.   Do not drive or operate heavy machinery when taking Pain medications.   Do not take more than prescribed Pain, Sleep and Anxiety Medications  Special Instructions: If you have smoked or chewed Tobacco  in the last 2 yrs please stop smoking, stop any regular Alcohol  and or any Recreational drug use.  Wear Seat belts while driving.  Please note You were cared for by a hospitalist during your hospital stay. If you have any questions about your discharge medications or the care you received while you were in the hospital after you are discharged, you can call the unit and asked to speak with the hospitalist on call if the hospitalist that took care of you is not available. Once you are discharged, your primary care physician will handle any further medical issues. Please note that NO REFILLS for any discharge medications will be authorized once you are discharged, as it is imperative that you return to your primary care physician (or establish a relationship with a primary care physician if you do not have one) for your aftercare needs so that they can reassess your need for medications and monitor your lab values.  You can reach the hospitalist office at phone [phone] or fax [phone]   If you do not have a primary care physician, you can call [phone] for a physician referral.  Activity: As tolerated with Full fall precautions use walker/cane & assistance as needed    Diet: heart healthy  Disposition Home

## 2023-12-30 NOTE — Telephone Encounter (Signed)
 Pharmacy Patient Advocate Encounter  Insurance verification completed.    The patient is insured through [employer]. Patient has Medicare and is not eligible for a copay card, but may be able to apply for patient assistance or Medicare RX Payment Plan (Patient Must reach out to their plan, if eligible for payment plan), if available.    Ran test claim for Tikosyn  500mcg capsule and the current 30 day co-pay is $0.   This test claim was processed through [facility]- copay amounts may vary at other pharmacies due to [employer], or as the patient moves through the different stages of their insurance plan.

## 2023-12-30 NOTE — Progress Notes (Addendum)
 Patient Name: Paula [name] Date of Encounter: 12/30/2023  Primary Cardiologist: [name], MD Electrophysiologist: Dr. Inocencio   Interval Summary   The patient is doing well today.  She asks about Tikosyn  - cost of meds, schedule of taking etc.  At this time, the patient denies chest pain, shortness of breath, or any new concerns.  Vital Signs    Vitals:   12/29/23 1625 12/29/23 1926 12/30/23 0100 12/30/23 0433  BP: 134/69 115/64 (!) 147/71 (!) 144/99  Pulse:  61 (!) 59 65  Resp: 18 18 18 18   Temp: 97.6 F (36.4 C) 97.8 F (36.6 C) 98.2 F (36.8 C) 98.7 F (37.1 C)  TempSrc: Oral Oral Oral Oral  SpO2: 95% 100% 99% 100%  Weight:    57 kg  Height:        Intake/Output Summary (Last 24 hours) at 12/30/2023 0834 Last data filed at 12/30/2023 9561 Gross per 24 hour  Intake 480 ml  Output 700 ml  Net -220 ml   Filed Weights   12/28/23 0500 12/29/23 0425 12/30/23 0433  Weight: 58.6 kg 57.6 kg 57 kg    Physical Exam    GEN- pleasant elderly female, in NAD, Alert and oriented  Lungs- Clear to ausculation bilaterally, normal work of breathing Cardiac- Regular rate and rhythm, no murmurs, rubs or gallops GI- soft, NT, ND, + BS Extremities- no clubbing or cyanosis. No edema  Telemetry    AP 60, SR 60's-80's (personally reviewed)   EP Information:   DEVICE HISTORY:  Abbott Dual Chamber PPM implanted 07/17/20 for SSS / bradycardia    Arrhythmia / AAD / Pertinent EP Studies AF > initial dx 04/2019 by PCP (palpitations)  ZIO 07/14/19 > 47% AF burden, frequent SVT episodes Near Syncope 06/2019 > PPM insertion for tachybrady Amiodarone  2021 > ~9 or 10/2023 failed due to elevated LFT's  Drodedarone 10/24/2023 > 12/2023 breakthrough AF, stopped   DCCV 12/29/23 > 200j x1 with conversion to [facility]    Paula [name] is a 88 y.o. female with a history of paroxysmal atrial fibrillation, tachy-brady syndrome s/p PPM, HFrEF, apical  variant hypertrophic cardiomyopathy & hypothyroidism re-admitted for decompensated systolic heart failure.  She developed AF in mid September 2025 - she had been on amiodarone  for some time (dating back to at least 2021). She had transaminitis and amiodarone  was stopped in late Sept/early Oct (per pt).  She was transitioned to dronedarone .  ECHO showed reduced LVEF in 11/2023 40-45% during admit for decompensated systolic HF. She was re-admitted for IV diuresis and weight is down from 65.9kg > 57.6 kg. She underwent cardioversion on 12/29/23 with 200 j x1 restoring NSR. EP consulted, dronedarone  was stopped with concern for reduced LVEF (?if tachy induced with AF, V -rates 70-160bpm when in AF by histogram).    Assessment & Plan    Atrial Fibrillation with RVR  CHA2DS2-VASc of at least 4, d/p DCCV 12/29/23 200j x1 > NSR . LVEF 11/2023 40-45% (new reduction, ? Tachy-mediated) -await amiodarone  level  -plan for Tikosyn  125mcg BID, first dose this evening  -Cr Cl 28-32 mL/min -remains in NSR  -tele monitoring  -OAC for stroke prophylaxis   Secondary Hypercoagulable State  -continue Coumadin  per pharmacy  Tachy-Brady Syndrome, SSS s/p PPM  -intermittent AP on monitor   Apical Variant Hypertrophic Cardiomyopathy  Acute Systolic Heart Failure  LV Mural Thrombus  VHD: TV / MV Insufficiency  -per Cardiology  -follow I/O's   Transfer to [facility]  for Tikosyn  monitoring / high risk medication loading. Appreciate TRH assistance with patients care.    For questions or updates, please contact [facility] Please consult [email or web address] for contact info under     Signed, Daphne Barrack, NP-C, AGACNP-BC [facility] - Electrophysiology  12/30/2023, 9:05 AM  I have seen and examined this patient with Daphne Barrack.  Agree with above, note added to reflect my findings.  In sinus rhythm.  Feeling well.  No acute complaints.  GEN: No acute distress.   Neck: No JVD Cardiac: regular   Respiratory: normal work of breathing GI: Soft, nontender, non-distended  MS: No edema; No deformity. Neuro:  Nonfocal  Skin: warm and dry, device site well healed Psych: Normal affect    Persistent atrial fibrillation: Awaiting for Multaq  washout.  Conroy Goracke start dofetilide  tonight.  Chadd Tollison start 125 mcg twice daily due to kidney function.  Has been off amiodarone  for a few months.  Review of telemetry shows sinus rhythm. Secondary hypercoagulable state: Continue Coumadin  Tachybradycardia syndrome: Post pacemaker Apical variant hypertrophic cardiomyopathy with acute systolic heart failure: Plan per primary cardiology  Delayla Hoffmaster M. Kuper Rennels MD 12/30/2023 9:18 AM

## 2023-12-30 NOTE — Care Management Important Message (Signed)
 Important Message  Patient Details  Name: Paula [name] MRN: [id number] Date of Birth: [birth year]   Important Message Given:  Yes - Medicare IM     [name] 12/30/2023, 9:42 AM

## 2023-12-30 NOTE — Plan of Care (Signed)
   Problem: Nutrition: Goal: Adequate nutrition will be maintained Outcome: Progressing

## 2023-12-30 NOTE — Plan of Care (Signed)
  Problem: Education: Goal: Knowledge of General Education information will improve Description: Including pain rating scale, medication(s)/side effects and non-pharmacologic comfort measures Outcome: Progressing   Problem: Health Behavior/Discharge Planning: Goal: Ability to manage health-related needs will improve Outcome: Progressing   Problem: Clinical Measurements: Goal: Ability to maintain clinical measurements within normal limits will improve Outcome: Progressing Goal: Will remain free from infection Outcome: Progressing Goal: Diagnostic test results will improve Outcome: Progressing Goal: Respiratory complications will improve Outcome: Progressing Goal: Cardiovascular complication will be avoided Outcome: Progressing   Problem: Activity: Goal: Risk for activity intolerance will decrease Outcome: Progressing   Problem: Nutrition: Goal: Adequate nutrition will be maintained Outcome: Progressing   Problem: Coping: Goal: Level of anxiety will decrease Outcome: Progressing   Problem: Elimination: Goal: Will not experience complications related to bowel motility Outcome: Progressing Goal: Will not experience complications related to urinary retention Outcome: Progressing   Problem: Pain Managment: Goal: General experience of comfort will improve and/or be controlled Outcome: Progressing   Problem: Safety: Goal: Ability to remain free from injury will improve Outcome: Progressing   Problem: Skin Integrity: Goal: Risk for impaired skin integrity will decrease Outcome: Progressing   Problem: Education: Goal: Ability to demonstrate management of disease process will improve Outcome: Progressing Goal: Ability to verbalize understanding of medication therapies will improve Outcome: Progressing Goal: Individualized Educational Video(s) Outcome: Progressing   Problem: Activity: Goal: Capacity to carry out activities will improve Outcome: Progressing    Problem: Cardiac: Goal: Ability to achieve and maintain adequate cardiopulmonary perfusion will improve Outcome: Progressing   Problem: Education: Goal: Knowledge of disease or condition will improve Outcome: Progressing Goal: Understanding of medication regimen will improve Outcome: Progressing Goal: Individualized Educational Video(s) Outcome: Progressing   Problem: Activity: Goal: Ability to tolerate increased activity will improve Outcome: Progressing   Problem: Cardiac: Goal: Ability to achieve and maintain adequate cardiopulmonary perfusion will improve Outcome: Progressing   Problem: Health Behavior/Discharge Planning: Goal: Ability to safely manage health-related needs after discharge will improve Outcome: Progressing

## 2023-12-30 NOTE — Progress Notes (Signed)
 Pharmacy: Dofetilide  (Tikosyn ) - Initial Consult Assessment and Electrolyte Replacement  Pharmacy consulted to assist in monitoring and replacing electrolytes in this 88 y.o. female admitted on 12/26/2023 undergoing dofetilide  initiation. First dofetilide  dose: 12/30/2023 20:00.   Assessment:  Patient Exclusion Criteria: If any screening criteria checked as Yes, then  patient  should NOT receive dofetilide  until criteria item is corrected.  If "Yes" please indicate correction plan.  YES  NO Patient  Exclusion Criteria Correction Plan/Comments   []   [x]   Baseline QTc interval is greater than or equal to 440 msec. IF above YES box checked dofetilide  contraindicated unless patient has ICD; then may proceed if QTc 500-550 msec or with known ventricular conduction abnormalities may proceed with QTc 550-600 msec. QTc = 0.4    []   [x]   Patient is known or suspected to have a digoxin level greater than 2 ng/ml: No results found for: DIGOXIN     []   [x]   Creatinine clearance less than 20 ml/min (calculated using Cockcroft-Gault, actual body weight and serum creatinine): Estimated Creatinine Clearance: 33 mL/min (by C-G formula based on SCr of 0.99 mg/dL).     []   [x]  Patient has received drugs known to prolong the QT intervals within the last 48 hour (examples: phenothiazines, tricyclics or tetracyclic antidepressants, macrolides, 1st generation H-1 antihistamines (especially diphenhydramine), fluoroquinolones, azoles, ondansetron , metoclopramide, promethazine).   Updated information on QT prolonging agents is available to be searched on the following database:QT prolonging agents -If SSRI or antihistamine needed, preferred options are sertraline and loratadine respectively     []   [x]  Patient received a dose of a thiazide diuretic in the last 48 hours [including hydrochlorothiazide  (Oretic ) alone or in any combination including triamterene (Dyazide, Maxzide)].    []   [x]  Patient  received a medication known to increase dofetilide  plasma concentrations prior to initial dofetilide  dose:  Trimethoprim (Primsol, Proloprim) in the last 36 hours Verapamil (Calan, Verelan) in the last 36 hours or a sustained release dose in the last 72 hours Megestrol (Megace) in the last 5 days  Cimetidine (Tagamet) in the last 6 hours Ketoconazole (Nizoral) in the last 24 hours Itraconazole (Sporanox) in the last 48 hours  Prochlorperazine (Compazine) in the last 36 hours     []   [x]   Patient is known to have a history of torsades de pointes; congenital or acquired long QT syndromes.    [x]   []   Patient has received a Class 1 and Class 3 antiarrhythmic with less than 2 half-lives since last dose. (Disopyramide, Quinidine, Procainamide, Lidocaine , Mexiletine, Flecainide, Propafenone, Sotalol, Dronedarone ) Spoke with Daphne Lever NP. Dr. Inocencio aware and will start Tikosyn  tonight. Dronedarone  half-life anywhere from 13-19 hours, so she could be within 2 half-life window or not.    []   [x]   Patient has received amiodarone  therapy in the past 3 months or amiodarone  level is greater than 0.3 ng/ml.    Labs:    Component Value Date/Time   K 3.9 12/30/2023 0255   MG 2.2 12/30/2023 0255     Plan: CrCL = 36.7  [facility] Calculated CrCl  Dose q12h  []  > 60 ml/min 500 mcg  [x]  40-60 ml/min 250 mcg  []  20-40 ml/min 125 mcg   [x]   Physician selected initial dose within range recommended for patients level of renal function - will monitor for response.  []   Physician selected initial dose outside of range recommended for patients level of renal function - will discuss if the dose should be altered  at this time.   Patient has been appropriately anticoagulated with warfarin (INR 2.4).  Potassium: K 3.9, potassium supplemented this AM. Will give additional 20 mEq.   Magnesium : Mg >2: Appropriate to initiate Tikosyn , no replacement needed     Thank you for allowing pharmacy to  participate in this patient's care   Rankin Sams 12/30/2023  4:30 PM

## 2023-12-30 NOTE — Progress Notes (Signed)
 PHARMACY - ANTICOAGULATION CONSULT NOTE  Pharmacy Consult for warfarin Indication: atrial fibrillation and apical thrombus  Allergies  Allergen Reactions   Eliquis  [Apixaban ] Other (See Comments)    Doesn't Work for her. Developed a clot while taking it.   Wound Dressing Adhesive Rash    Patient Measurements: Height: 5' 2.5 (158.8 cm) Weight: 57 kg (125 lb 9.6 oz) IBW/kg (Calculated) : 51.25 HEPARIN  DW (KG): 64.3  Vital Signs: Temp: 98.7 F (37.1 C) (12/09 0433) Temp Source: Oral (12/09 0433) BP: 144/99 (12/09 0433) Pulse Rate: 65 (12/09 0433)  Labs: Recent Labs    12/28/23 0254 12/29/23 0215 12/30/23 0255  HGB 12.8 12.7 12.9  HCT 39.0 38.7 38.7  PLT 295 318 310  LABPROT 25.7* 25.8* 27.2*  INR 2.2* 2.2* 2.4*  CREATININE 1.11* 1.13* 0.99    Estimated Creatinine Clearance: 33 mL/min (by C-G formula based on SCr of 0.99 mg/dL).   Medical History: Past Medical History:  Diagnosis Date   Anemia    Atrial fibrillation (HCC)    CHF (congestive heart failure) (HCC)    DVT (deep venous thrombosis) (HCC)    in her heart   Hyperlipidemia    Hypertension    Hypothyroidism    Irregular heart rate    Thrombus in heart chamber 04/11/2022    Assessment: 88 YOF presenting with persistent dyspnea and volume overload, hx of afib and apical thrombus on warfarin PTA, INR low end therapeutic on admission at 2.1, last dose taken 12/4.   -INR= 2.4 -Noted s/p DCCV 12/8 -plans noted to start Tikosyn   PTA dosing: 4mg  daily  Goal of Therapy:  INR 2-3 Monitor platelets by anticoagulation protocol: Yes   Plan:  Warfarin 4 mg daily  Daily INR, s/s bleeding  Prentice Poisson, PharmD Clinical Pharmacist **Pharmacist phone directory can now be found on [email or web address] ([email or web address]).  Listed under [facility].

## 2023-12-30 NOTE — Progress Notes (Addendum)
  Progress Note  Patient Name: Paula [name] Date of Encounter: 12/30/2023 [facility] Cardiologist: [name], MD   Interval Summary   Feeling well, eager to get home Reports improvement in appetite.   Vital Signs Vitals:   12/29/23 1625 12/29/23 1926 12/30/23 0100 12/30/23 0433  BP: 134/69 115/64 (!) 147/71 (!) 144/99  Pulse:  61 (!) 59 65  Resp: 18 18 18 18   Temp: 97.6 F (36.4 C) 97.8 F (36.6 C) 98.2 F (36.8 C) 98.7 F (37.1 C)  TempSrc: Oral Oral Oral Oral  SpO2: 95% 100% 99% 100%  Weight:    57 kg  Height:        Intake/Output Summary (Last 24 hours) at 12/30/2023 1023 Last data filed at 12/30/2023 1018 Gross per 24 hour  Intake 600 ml  Output 950 ml  Net -350 ml      12/30/2023    4:33 AM 12/29/2023    4:25 AM 12/28/2023    5:00 AM  Last 3 Weights  Weight (lbs) 125 lb 9.6 oz 126 lb 14.4 oz 129 lb 3.2 oz  Weight (kg) 56.972 kg 57.561 kg 58.605 kg      Telemetry/ECG  A paced rhythm HR 61 - Personally Reviewed  Physical Exam  GEN: No acute distress.   Neck: No JVD Cardiac: RRR, no murmurs, rubs, or gallops. Radial pulse 2+ Respiratory: Clear to auscultation bilaterally. GI: Soft, nontender, non-distended  MS: No edema  Assessment & Plan  88 y.o. female ith a hx of paroxysmal atrial fibrillation, tachybrady syndrome status post PPM, hx of SVT, hypertension, hypertrophic cardiomyopathy, hx of LV thrombus while on eliquis , MR, TR, and hypothyroidism who had presented to outpatient cardiology appointment on 12/5 with evidence of acute heart failure. She was admitted for IV diuresis and AF management.    Acute systolic heart failure with RV dysfunction Hypertrophic cardiomyopathy Suspected to be 2/2 AF. Underwent DCCV on 12/8. See below.  Received IV diuresis with weight reduction. -20lbs.  Net IO Since Admission: -8,490 mL [12/30/23 1029] On exam, euvolemic  Transitioned to oral torsemide  20 mg today Continue toprol  25 mg   Continue entresto  24-26 BID Agree with jardiance  10 mg, first dose today Start spironolactone 25 mg tomorrow if BP remains elevated  Hx of LV thrombus Failed eliquis  Continue warfarin per pharmacy  Persistent atrial fibrillation Tachy-Brady s/p PPM Hx of SVT Underwent DCCV on 12/8 Currently in a-paced rhythm Chad Vasc score 5  EP following. Appreciate their recommendations. Starting tikosyn  this evening.  Continue warfarin Continue toprol  as above  Hypertension BP: 144/99 Medications as above  Hyperlipidemia Statin on hold with transaminitis, restart when able  MR  TR Given age and comorbidities, not a good candidate for transcatheter intervention Continue to monitor outpatient.    Per primary Hypothyroidism CKD  For questions or updates, please contact [facility] Please consult [email or web address] for contact info under       Signed, Leontine LOISE Salen, PA-C   Patient seen and examined.  Agree with above documentation.  On exam, patient is alert and oriented, regular rate and rhythm, no murmurs, lungs CTAB, no LE edema or JVD.  Appears euvolemic on exam, transition to p.o. torsemide  today.  Maintaining sinus rhythm. Starting on Tikosyn  load per EP.  [name], MD

## 2023-12-31 ENCOUNTER — Ambulatory Visit

## 2023-12-31 ENCOUNTER — Other Ambulatory Visit: Payer: Self-pay

## 2023-12-31 DIAGNOSIS — I5021 Acute systolic (congestive) heart failure: Secondary | ICD-10-CM | POA: Diagnosis not present

## 2023-12-31 DIAGNOSIS — N1831 Chronic kidney disease, stage 3a: Secondary | ICD-10-CM

## 2023-12-31 DIAGNOSIS — I422 Other hypertrophic cardiomyopathy: Secondary | ICD-10-CM | POA: Diagnosis not present

## 2023-12-31 DIAGNOSIS — E039 Hypothyroidism, unspecified: Secondary | ICD-10-CM | POA: Diagnosis not present

## 2023-12-31 LAB — CBC
HCT: 43.5 % (ref 36.0–46.0)
Hemoglobin: 14.3 g/dL (ref 12.0–15.0)
MCH: 28.2 pg (ref 26.0–34.0)
MCHC: 32.9 g/dL (ref 30.0–36.0)
MCV: 85.8 fL (ref 80.0–100.0)
Platelets: 329 K/uL (ref 150–400)
RBC: 5.07 MIL/uL (ref 3.87–5.11)
RDW: 14.3 % (ref 11.5–15.5)
WBC: 5.7 K/uL (ref 4.0–10.5)
nRBC: 0 % (ref 0.0–0.2)

## 2023-12-31 LAB — BASIC METABOLIC PANEL WITH GFR
Anion gap: 6 (ref 5–15)
BUN: 17 mg/dL (ref 8–23)
CO2: 30 mmol/L (ref 22–32)
Calcium: 8.5 mg/dL — ABNORMAL LOW (ref 8.9–10.3)
Chloride: 98 mmol/L (ref 98–111)
Creatinine, Ser: 1.2 mg/dL — ABNORMAL HIGH (ref 0.44–1.00)
GFR, Estimated: 44 mL/min — ABNORMAL LOW (ref >60)
Glucose, Bld: 83 mg/dL (ref 70–99)
Potassium: 4.4 mmol/L (ref 3.5–5.1)
Sodium: 134 mmol/L — ABNORMAL LOW (ref 135–145)

## 2023-12-31 LAB — MAGNESIUM: Magnesium: 2.4 mg/dL (ref 1.7–2.4)

## 2023-12-31 LAB — PROTIME-INR
INR: 2.4 — ABNORMAL HIGH (ref 0.8–1.2)
Prothrombin Time: 27.7 s — ABNORMAL HIGH (ref 11.4–15.2)

## 2023-12-31 MED ORDER — LOSARTAN POTASSIUM 25 MG PO TABS
25.0000 mg | ORAL_TABLET | Freq: Every day | ORAL | Status: DC
  Administered 2024-01-01 – 2024-01-02 (×2): 25 mg via ORAL
  Filled 2023-12-31 (×2): qty 1

## 2023-12-31 NOTE — Progress Notes (Addendum)
 Electrophysiology Rounding Note  Patient Name: Paula [name] Date of Encounter: 12/31/2023  Primary Cardiologist: [name], MD  Electrophysiologist: OLE ONEIDA HOLTS, MD    Subjective   Pt remains in NSR on Tikosyn  125 mcg BID   QTc from EKG last pm shows borderline QTc at  The patient is doing well today.  At this time, the patient denies chest pain, shortness of breath, or any new concerns.  Inpatient Medications    Scheduled Meds:  dofetilide   125 mcg Oral BID   empagliflozin   10 mg Oral Daily   levothyroxine   137 mcg Oral Q0600   magnesium  oxide  400 mg Oral Daily   metoprolol  succinate  25 mg Oral Daily   potassium chloride   20 mEq Oral Daily   sacubitril -valsartan   1 tablet Oral BID   torsemide   20 mg Oral Daily   warfarin  4 mg Oral q1600   Warfarin - Pharmacist Dosing Inpatient   Does not apply q1600   Continuous Infusions:  PRN Meds: acetaminophen  **OR** acetaminophen , polyethylene glycol   Vital Signs    Vitals:   12/30/23 1534 12/30/23 2038 12/30/23 2300 12/31/23 0411  BP: 128/70 133/87 136/65 (!) 152/68  Pulse:  66 65 61  Resp: 16 18 16 19   Temp: 98.2 F (36.8 C) 98.7 F (37.1 C) 98.1 F (36.7 C) 97.8 F (36.6 C)  TempSrc: Oral Oral Oral Oral  SpO2: 100%  100% 100%  Weight:    55.8 kg  Height:        Intake/Output Summary (Last 24 hours) at 12/31/2023 0754 Last data filed at 12/30/2023 1700 Gross per 24 hour  Intake 480 ml  Output 250 ml  Net 230 ml   Filed Weights   12/29/23 0425 12/30/23 0433 12/31/23 0411  Weight: 57.6 kg 57 kg 55.8 kg    Physical Exam    GEN- NAD, A&O x 3. Normal affect.  Lungs- CTAB, Normal effort.  Heart- Regular rate and rhythm. No M/G/R GI- Soft, NT, ND Extremities- No clubbing, cyanosis, or edema Skin- no rash or lesion  Labs    CBC Recent Labs    12/30/23 0255 12/31/23 0412  WBC 6.7 5.7  HGB 12.9 14.3  HCT 38.7 43.5  MCV 86.6 85.8  PLT 310 329   Basic  Metabolic Panel Recent Labs    87/90/74 1738 12/31/23 0412  NA 134* 134*  K 4.4 4.4  CL 97* 98  CO2 28 30  GLUCOSE 116* 83  BUN 19 17  CREATININE 1.40* 1.20*  CALCIUM  8.2* 8.5*  MG 2.4 2.4    Telemetry    AP 60, SR 60's-80's (personally reviewed)   EP Information:    DEVICE HISTORY:  Abbott Dual Chamber PPM implanted 07/17/20 for SSS / bradycardia    Arrhythmia / AAD / Pertinent EP Studies AF > initial dx 04/2019 by PCP (palpitations)  ZIO 07/14/19 > 47% AF burden, frequent SVT episodes Near Syncope 06/2019 > PPM insertion for tachybrady Amiodarone  2021 > ~9 or 10/2023 failed due to elevated LFT's  Drodedarone 10/24/2023 > 12/2023 breakthrough AF, stopped   DCCV 12/29/23 > 200j x1 with conversion to NSR Tikosyn  12/30/23 >     Patient Profile     Paula [name] is a 88 y.o. female with a history of paroxysmal atrial fibrillation, tachy-brady syndrome s/p PPM, HFrEF, apical variant hypertrophic cardiomyopathy & hypothyroidism re-admitted for decompensated systolic heart failure.  She developed AF in mid September 2025 - she  had been on amiodarone  for some time (dating back to at least 2021). She had transaminitis and amiodarone  was stopped in late Sept/early Oct (per pt).  She was transitioned to dronedarone .  ECHO showed reduced LVEF in 11/2023 40-45% during admit for decompensated systolic HF. She was re-admitted for IV diuresis and weight is down from 65.9kg > 57.6 kg. She underwent cardioversion on 12/29/23 with 200 j x1 restoring NSR. EP consulted, dronedarone  was stopped with concern for reduced LVEF (?if tachy induced with AF, V -rates 70-160bpm when in AF by histogram).  Tikosyn  loading initiated on 12/9 PM  Assessment & Plan    Persistent Atrial Fibrillation  CHA2DS2-VASc of at least 4, d/p DCCV 12/29/23 200j x1 > NSR . LVEF 11/2023 40-45% (new reduction, ? Tachy-mediated) Pt remains in NSR on Tikosyn  125 mcg BID  Continue Coumadin  (hx Eliquis  allergy) Creatinine,  ser  1.20* (12/10 9587) Magnesium   2.4 (12/10 0412) Potassium4.4 (12/10 0412) No electrolyte supplementation needed Await amiodarone  level (though anticipate low) Follow EKG / QTc closely   Secondary Hypercoagulable State  -continue Coumadin  per pharmacy   Tachy-Brady Syndrome, SSS s/p PPM  -intermittent AP 60 on monitor noted   Apical Variant Hypertrophic Cardiomyopathy  Acute Systolic Heart Failure  LV Mural Thrombus  VHD: TV / MV Insufficiency  -follows with Dr. Kate  -monitor wt, I/O's closely    Plan for home Friday if QTc remains stable.     For questions or updates, please contact [facility] Please consult [email or web address] for contact info under Cardiology/STEMI.  Signed, Daphne Barrack, NP-C, AGACNP-BC [facility] - Electrophysiology  12/31/2023, 8:01 AM   Paula [name] was seen by me today along with Daphne Barrack. I have personally performed an evaluation on this patient.  My findings are as follows: 88 y.o. female remains in sinus rhythm.  No acute complaints..  Data: EKG(s) and pertinent labs, studies, etc were personally reviewed and interpreted by me:  EKG, labs, telemetry Otherwise, I agree with data as outlined by the advanced practice provider.  Exam performed by me: Gen: No acute distress Neck: No JVD Cardiac: Regular rhythm Lungs: Normal work of breathing Extremities: No edema  My Assessment and Plan: 1.  Persistent atrial fibrillation: Is post cardioversion.  She is now post 1 dose of dofetilide .  She remains in sinus rhythm.  QTc acceptable.  On 125 mcg twice daily due to kidney function.  Paula [name] continue with current management.  2.  Secondary hypercoagulable state: On Coumadin   3.  Tachybradycardia syndrome: Post pacemaker  4.  Apical variant hypertrophic cardiomyopathy with acute diastolic heart failure: Has been properly diuresed.  Further plan per primary cardiology.   Signed,  Paula Gibler Gladis Norton, MD   12/31/2023 8:57 AM

## 2023-12-31 NOTE — TOC Progression Note (Signed)
 Transition of Care [facility]) - Progression Note    Patient Details  Name: Paula [name] MRN: [id number] Date of Birth: [birth year]  Transition of Care [facility]) CM/SW Contact  Graves-Bigelow, Erminio Deems, RN Phone Number: 12/31/2023, 4:06 PM  Clinical Narrative:  Patient presented for Tikosyn  Load. Inpatient Case Manager spoke with the patient regarding co pay cost. Patient is agreeable to cost and would like to have the initial Rx filled via [facility] and the Rx refills 90 day supply escribed to [facility] for delivery. No further needs identified at this time.   Expected Discharge Plan: Home/Self Care Barriers to Discharge: Continued Medical Work up  Expected Discharge Plan and Services In-house Referral: NA Discharge Planning Services: CM Consult Post Acute Care Choice: NA Living arrangements for the past 2 months: Single Family Home                 DME Arranged: N/A DME Agency: NA       HH Arranged: NA  Social Drivers of Health (SDOH) Interventions SDOH Screenings   Food Insecurity: No Food Insecurity (12/26/2023)  Housing: Low Risk  (12/27/2023)  Transportation Needs: No Transportation Needs (12/26/2023)  Utilities: Not At Risk (12/26/2023)  Social Connections: Socially Integrated (12/27/2023)  Tobacco Use: Low Risk  (12/26/2023)   Readmission Risk Interventions    12/29/2023    3:10 PM  Readmission Risk Prevention Plan  Transportation Screening Complete  Home Care Screening Complete  Medication Review (RN CM) Complete

## 2023-12-31 NOTE — Assessment & Plan Note (Addendum)
 Echocardiogram with mild reduction in LV systolic function with EF 40 to 45%, global hypokinesis, severe asymmetric LVH of the apical segment, RV systolic function with mild reduction LA with moderate dilatation, RA with severe dilatation, moderate mitral regurgitation, severe tricuspid valve regurgitation, RVSP 52.7 mmHg,  Hypertrophic cardiomyopathy   Acute on chronic cor pulmonale Pulmonary hypertension.  Patient was placed on IV furosemide  for diuresis, negative negative fluid balance was achieved - 7,290 ml, with significant improvement in her symptoms.   Continue medical therapy with losartan , metoprolol , and SGLT 2 inh.  Loop diuretic to use as needed in case of volume overload.   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  Anticoagulation has been on warfarin

## 2023-12-31 NOTE — Plan of Care (Signed)
°  Problem: Education: Goal: Knowledge of General Education information will improve Description: Including pain rating scale, medication(s)/side effects and non-pharmacologic comfort measures Outcome: Progressing   Problem: Health Behavior/Discharge Planning: Goal: Ability to manage health-related needs will improve Outcome: Progressing   Problem: Clinical Measurements: Goal: Ability to maintain clinical measurements within normal limits will improve Outcome: Progressing   Problem: Activity: Goal: Risk for activity intolerance will decrease Outcome: Progressing   Problem: Nutrition: Goal: Adequate nutrition will be maintained Outcome: Progressing   Problem: Safety: Goal: Ability to remain free from injury will improve Outcome: Progressing   Problem: Cardiac: Goal: Ability to achieve and maintain adequate cardiopulmonary perfusion will improve Outcome: Progressing

## 2023-12-31 NOTE — Progress Notes (Addendum)
 Progress Note  Patient Name: Paula [name] Date of Encounter: 12/31/2023 [facility] Cardiologist: [name], MD   Interval Summary   Doing well. Shared last nigth was her best sleep since admission.  Reported at home she usually has low morning BP systolic ~90. Did get up and walk around this morning, was a little lightheaded but okay. No DOE. BP recheck 113/63.  Vital Signs Vitals:   12/30/23 2038 12/30/23 2300 12/31/23 0411 12/31/23 0755  BP: 133/87 136/65 (!) 152/68 (!) 90/58  Pulse: 66 65 61   Resp: 18 16 19 18   Temp: 98.7 F (37.1 C) 98.1 F (36.7 C) 97.8 F (36.6 C) 98 F (36.7 C)  TempSrc: Oral Oral Oral Oral  SpO2:  100% 100%   Weight:   55.8 kg   Height:        Intake/Output Summary (Last 24 hours) at 12/31/2023 0801 Last data filed at 12/31/2023 0755 Gross per 24 hour  Intake 840 ml  Output 250 ml  Net 590 ml      12/31/2023    4:11 AM 12/30/2023    4:33 AM 12/29/2023    4:25 AM  Last 3 Weights  Weight (lbs) 123 lb 1.6 oz 125 lb 9.6 oz 126 lb 14.4 oz  Weight (kg) 55.838 kg 56.972 kg 57.561 kg      Telemetry/ECG  AP HR ~63, brief episodes of SVT (2 episodes HR ~140) - Personally Reviewed  Physical Exam  GEN: No acute distress.   Neck: No JVD Cardiac: RRR, no murmurs, rubs, or gallops.  Respiratory: Clear to auscultation bilaterally. GI: Soft, nontender, non-distended  MS: No edema  Assessment & Plan  88 y.o. female ith a hx of paroxysmal atrial fibrillation, tachybrady syndrome status post PPM, hx of SVT, hypertension, hypertrophic cardiomyopathy, hx of LV thrombus while on eliquis , MR, TR, and hypothyroidism who had presented to outpatient cardiology appointment on 12/5 with evidence of acute heart failure. She was admitted for IV diuresis and AF management.     Acute systolic heart failure with RV dysfunction Hypertrophic cardiomyopathy Suspected to be 2/2 AF. Underwent DCCV on 12/8. See below.  Received IV  diuresis with weight reduction. -20lbs.  Net IO Since Admission: -7,770 mL [12/31/23 0803] On exam, euvolemic  Cr bump overnight, though improving. Cr this morning 1.2   With hypotension, renal function, and euvolemia on exam would stop diuresis. Continue toprol  25 mg  Continue entresto  24-26 BID will place BP hold parameters  Continue jardiance  10 mg Would hold spironolactone 25 mg given renal function and hypotension this morning, potentially start outpatient.    Hx of LV thrombus Failed eliquis  Continue warfarin per pharmacy   Persistent atrial fibrillation Tachy-Brady s/p PPM Hx of SVT Underwent DCCV on 12/8 Currently AP Chad Vasc score 5   EP following. Appreciate their recommendations. Currently loading tikosyn .  Continue warfarin Continue toprol  as above   Hypertension BP: 113/63 Medications as above. Advised patient to keep an eye on her BP at home,if she feels symptomatic to assess her BP. Advised her that a SBP < 90 is concerning and we would like to know about it.    Hyperlipidemia Statin on hold with transaminitis, restart when able   MR  TR Given age and comorbidities, not a good candidate for transcatheter intervention Continue to monitor outpatient.     Per primary Hypothyroidism CKD   For questions or updates, please contact [facility] Please consult [email or web address] for contact info under  Signed, Leontine LOISE Salen, PA-C   Patient seen and examined.  Agree with above documentation.  On exam, patient is alert and oriented, regular rate and rhythm, no murmurs, lungs CTAB, no LE edema or JVD.  Mild bump in creatinine, she reports lightheadedness and had soft BP this morning, will hold torsemide .  She appears euvolemic on exam.  Will also hold her spironolactone and switch Entresto  to losartan .  Continue Tikosyn  load per EP  [name], MD

## 2023-12-31 NOTE — Progress Notes (Signed)
 Pharmacy: Dofetilide  (Tikosyn ) - Follow Up Assessment and Electrolyte Replacement  Pharmacy consulted to assist in monitoring and replacing electrolytes in this 88 y.o. female admitted on 12/26/2023 undergoing dofetilide  initiation. First dofetilide  dose: 12/30/23  Labs:    Component Value Date/Time   K 4.4 12/31/2023 0412   MG 2.4 12/31/2023 0412     Plan: Potassium: K >/= 4: No additional supplementation needed  Magnesium : Mg > 2: No additional supplementation needed     Paula [name], PharmD, BCPS, [facility] Clinical Pharmacist [phone] Please check AMION for all [facility] numbers 12/31/2023

## 2023-12-31 NOTE — Progress Notes (Signed)
 PHARMACY - ANTICOAGULATION CONSULT NOTE  Pharmacy Consult for warfarin Indication: atrial fibrillation and apical thrombus  Allergies  Allergen Reactions   Eliquis  [Apixaban ] Other (See Comments)    Doesn't Work for her. Developed a clot while taking it.   Wound Dressing Adhesive Rash    Patient Measurements: Height: 5' 2.5 (158.8 cm) Weight: 55.8 kg (123 lb 1.6 oz) IBW/kg (Calculated) : 51.25 HEPARIN  DW (KG): 64.3  Vital Signs: Temp: 97.8 F (36.6 C) (12/10 0411) Temp Source: Oral (12/10 0411) BP: 152/68 (12/10 0411) Pulse Rate: 61 (12/10 0411)  Labs: Recent Labs    12/29/23 0215 12/30/23 0255 12/30/23 1738 12/31/23 0412  HGB 12.7 12.9  --  14.3  HCT 38.7 38.7  --  43.5  PLT 318 310  --  329  LABPROT 25.8* 27.2*  --  27.7*  INR 2.2* 2.4*  --  2.4*  CREATININE 1.13* 0.99 1.40* 1.20*    Estimated Creatinine Clearance: 27.3 mL/min (A) (by C-G formula based on SCr of 1.2 mg/dL (H)).   Medical History: Past Medical History:  Diagnosis Date   Anemia    Atrial fibrillation (HCC)    CHF (congestive heart failure) (HCC)    DVT (deep venous thrombosis) (HCC)    in her heart   Hyperlipidemia    Hypertension    Hypothyroidism    Irregular heart rate    Thrombus in heart chamber 04/11/2022    Assessment: 88 YOF presenting with persistent dyspnea and volume overload, hx of afib and apical thrombus on warfarin PTA, INR low end therapeutic on admission at 2.1, last dose taken 12/4. Warfarin resumed per pharmacy. Pt s/p DCCV 12/8 now undergoing Tikosyn  loading starting 12/9.  INR therapeutic at 2.4, CBC stable.  PTA dosing: 4mg  daily  Goal of Therapy:  INR 2-3 Monitor platelets by anticoagulation protocol: Yes   Plan:  Warfarin 4mg  daily  Daily INR, s/s bleeding   Ozell Jamaica, PharmD, BCPS, [facility] Clinical Pharmacist [phone] Please check AMION for all [facility] Pharmacy numbers 12/31/2023

## 2023-12-31 NOTE — Plan of Care (Signed)
°  Problem: Education: Goal: Knowledge of General Education information will improve Description: Including pain rating scale, medication(s)/side effects and non-pharmacologic comfort measures Outcome: Progressing   Problem: Health Behavior/Discharge Planning: Goal: Ability to manage health-related needs will improve Outcome: Progressing   Problem: Clinical Measurements: Goal: Ability to maintain clinical measurements within normal limits will improve Outcome: Progressing Goal: Will remain free from infection Outcome: Progressing Goal: Diagnostic test results will improve Outcome: Progressing Goal: Respiratory complications will improve Outcome: Progressing Goal: Cardiovascular complication will be avoided Outcome: Progressing   Problem: Activity: Goal: Risk for activity intolerance will decrease Outcome: Progressing   Problem: Nutrition: Goal: Adequate nutrition will be maintained Outcome: Progressing   Problem: Coping: Goal: Level of anxiety will decrease Outcome: Progressing   Problem: Elimination: Goal: Will not experience complications related to bowel motility Outcome: Progressing Goal: Will not experience complications related to urinary retention Outcome: Progressing   Problem: Pain Managment: Goal: General experience of comfort will improve and/or be controlled Outcome: Progressing   Problem: Skin Integrity: Goal: Risk for impaired skin integrity will decrease Outcome: Progressing   Problem: Education: Goal: Ability to demonstrate management of disease process will improve Outcome: Progressing Goal: Ability to verbalize understanding of medication therapies will improve Outcome: Progressing Goal: Individualized Educational Video(s) Outcome: Progressing   Problem: Activity: Goal: Capacity to carry out activities will improve Outcome: Progressing   Problem: Education: Goal: Knowledge of disease or condition will improve Outcome: Progressing Goal:  Understanding of medication regimen will improve Outcome: Progressing Goal: Individualized Educational Video(s) Outcome: Progressing   Problem: Activity: Goal: Ability to tolerate increased activity will improve Outcome: Progressing   Problem: Cardiac: Goal: Ability to achieve and maintain adequate cardiopulmonary perfusion will improve Outcome: Progressing   Problem: Health Behavior/Discharge Planning: Goal: Ability to safely manage health-related needs after discharge will improve Outcome: Progressing

## 2023-12-31 NOTE — Progress Notes (Signed)
 Morning EKG reviewed     Shows remains in NSR with borderline QTc at 484 ms in aVL.  Continue  Tikosyn  125 mcg BID.   Potassium4.4 (12/10 0412) Magnesium   2.4 (12/10 0412) Creatinine, ser  1.20* (12/10 9587)  Plan for home Friday if QTc remains stable    Daphne Barrack, NP-C, AGACNP-BC [facility] - Electrophysiology  12/31/2023, 3:19 PM

## 2023-12-31 NOTE — Progress Notes (Signed)
°  Progress Note   Patient: Paula [name] FMW:[id number] DOB: 12-Feb-1935 DOA: 12/26/2023     5 DOS: the patient was seen and examined on 12/31/2023   Brief hospital course: 88 year old female with history of A-fib on Multaq , pacemaker, hypertrophic cardiomyopathy, prior cardiac thrombus while on Eliquis  now on Coumadin , chronic hyponatremia, hypothyroidism who comes into the hospital with fluid overload directed from cardiology clinic. She was recently hospitalized and discharged November 29, and at that time her 2D echocardiogram showed new onset RV systolic dysfunction with moderately elevated PA pressure, and her LVEF decreased to 40 And 45% from prior normal values. She comes into the hospital with dyspnea on discharge, 10 pound weight gain and evidence of fluid overload. Cardiology consulted   Assessment and Plan: * Acute on chronic diastolic CHF (congestive heart failure) (HCC) Echocardiogram with mild reduction in LV systolic function with EF 40 to 45%, global hypokinesis, severe asymmetric LVH of the apical segment, RV systolic function with mild reduction LA with moderate dilatation, RA with severe dilatation, moderate mitral regurgitation, severe tricuspid valve regurgitation, RVSP 52.7 mmHg,   Acute on chronic cor pulmonale Pulmonary hypertension.  Negative fluid balance since admission - 7,530 ml.   Continue medical therapy with losartan , metoprolol , and SGLT 2 inh.   Essential hypertension Continue blood pressure control with losartan  and metoprolol .   Persistent atrial fibrillation (HCC) Tachybradycardia syndrome sp pacemaker.   Continue with dofetilide  for rhythm control.  AV blockade with metoprolol  succinate 25 mg  Continue anticoagulation with warfarin INR today 2.4  Continue telemetry monitoring   Chronic kidney disease, stage 3a (HCC) AKI Hyponatremia  Volume status has improved, today serum Na is 134 with K at 1,20 and serum bicarbonate at 17    Hypothyroidism Continue with levothyroxine        Subjective: Patient is feeling well, her dyspnea and edema have improved, today she had low blood pressure and dizziness.   Physical Exam: Vitals:   12/31/23 0819 12/31/23 0830 12/31/23 0909 12/31/23 1143  BP: (!) 99/57 (!) 88/56 113/63 133/67  Pulse:   67 61  Resp:    18  Temp:    97.8 F (36.6 C)  TempSrc:    Oral  SpO2:    100%  Weight:      Height:       Neurology awake and alert ENT with mild pallor with no icterus Cardiovascular with S1 and S2 present and regular with no gallops or rubs, positive systolic murmur at the left lower sternal border.  No JVD Respiratory with mild rales at bases with no wheezing or rhonchi  Abdomen soft and non distended No lower extremity edema   Data Reviewed:    Family Communication: I spoke with patient's husband at the bedside, we talked in detail about patient's condition, plan of care and prognosis and all questions were addressed.   Disposition: Status is: Inpatient Remains inpatient appropriate because: heart failure management   Planned Discharge Destination: Home    Author: Elidia Toribio Furnace, MD 12/31/2023 2:19 PM  For on call review [email or web address].

## 2024-01-01 ENCOUNTER — Other Ambulatory Visit: Payer: Self-pay

## 2024-01-01 DIAGNOSIS — I5033 Acute on chronic diastolic (congestive) heart failure: Secondary | ICD-10-CM

## 2024-01-01 LAB — BASIC METABOLIC PANEL WITH GFR
Anion gap: 5 (ref 5–15)
BUN: 15 mg/dL (ref 8–23)
CO2: 27 mmol/L (ref 22–32)
Calcium: 8.3 mg/dL — ABNORMAL LOW (ref 8.9–10.3)
Chloride: 99 mmol/L (ref 98–111)
Creatinine, Ser: 0.89 mg/dL (ref 0.44–1.00)
GFR, Estimated: >60 mL/min (ref >60)
Glucose, Bld: 79 mg/dL (ref 70–99)
Potassium: 4.4 mmol/L (ref 3.5–5.1)
Sodium: 131 mmol/L — ABNORMAL LOW (ref 135–145)

## 2024-01-01 LAB — HEPATIC FUNCTION PANEL
ALT: 30 U/L (ref 0–44)
AST: 40 U/L (ref 15–41)
Albumin: 3.5 g/dL (ref 3.5–5.0)
Alkaline Phosphatase: 71 U/L (ref 38–126)
Bilirubin, Direct: 0.2 mg/dL (ref 0.0–0.2)
Indirect Bilirubin: 0.6 mg/dL (ref 0.3–0.9)
Total Bilirubin: 0.8 mg/dL (ref 0.0–1.2)
Total Protein: 7.2 g/dL (ref 6.5–8.1)

## 2024-01-01 LAB — CBC
HCT: 41.9 % (ref 36.0–46.0)
Hemoglobin: 13.7 g/dL (ref 12.0–15.0)
MCH: 28.2 pg (ref 26.0–34.0)
MCHC: 32.7 g/dL (ref 30.0–36.0)
MCV: 86.2 fL (ref 80.0–100.0)
Platelets: 309 K/uL (ref 150–400)
RBC: 4.86 MIL/uL (ref 3.87–5.11)
RDW: 14.2 % (ref 11.5–15.5)
WBC: 5.1 K/uL (ref 4.0–10.5)
nRBC: 0 % (ref 0.0–0.2)

## 2024-01-01 LAB — PROTIME-INR
INR: 2.2 — ABNORMAL HIGH (ref 0.8–1.2)
Prothrombin Time: 25.7 s — ABNORMAL HIGH (ref 11.4–15.2)

## 2024-01-01 LAB — MAGNESIUM: Magnesium: 2.7 mg/dL — ABNORMAL HIGH (ref 1.7–2.4)

## 2024-01-01 MED ORDER — HYDROCORTISONE 1 % EX CREA
1.0000 | TOPICAL_CREAM | Freq: Three times a day (TID) | CUTANEOUS | Status: DC | PRN
  Administered 2024-01-01: 1 via TOPICAL
  Filled 2024-01-01: qty 28

## 2024-01-01 MED ORDER — AMIODARONE HCL 200 MG PO TABS: 100.0000 mg | ORAL_TABLET | Freq: Every day | ORAL | Status: DC

## 2024-01-01 MED ORDER — LORATADINE 10 MG PO TABS
10.0000 mg | ORAL_TABLET | Freq: Every day | ORAL | Status: DC | PRN
  Administered 2024-01-01: 10 mg via ORAL
  Filled 2024-01-01: qty 1

## 2024-01-01 MED ORDER — AMIODARONE HCL 200 MG PO TABS: 200.0000 mg | ORAL_TABLET | Freq: Two times a day (BID) | ORAL | Status: DC

## 2024-01-01 NOTE — Progress Notes (Addendum)
 Morning EKG reviewed     Shows remains in NSR with prolonged QTc at 501 ms.  Stop Tikosyn .    Potassium4.4 (12/11 0420) Magnesium   2.7* (12/11 0420) Creatinine, ser  0.89 (12/11 9579)    Reviewed with Dr. Inocencio, Dr. Kate and pharmacy.  Plan to avoid restarting dronedarone  given reduced LVEF.  Will re-load patient on amiodarone  after tikosyn  wash out.    Plan:  Begin Amiodarone  200 mg BID x7 days on 01/03/24, followed by amiodarone  100 mg daily  EP follow up in Clinic post hospitalization > has appt 01/27/23 with Jodie Passey, PA-C   Daphne Barrack, NP-C, AGACNP-BC [facility] - Electrophysiology  01/01/2024, 10:54 AM

## 2024-01-01 NOTE — Progress Notes (Addendum)
 Progress Note  Patient Name: Paula [name] Date of Encounter: 01/01/2024 [facility] Cardiologist: [name], MD   Interval Summary   Doing well, did not sleep as well last night but was able to nap this morning without difficulty.   Vital Signs Vitals:   12/31/23 2043 12/31/23 2310 01/01/24 0422 01/01/24 0757  BP: 134/64 (!) 125/59 124/62 108/66  Pulse: 65 65 66 72  Resp: 16 15 18 18   Temp: 98 F (36.7 C) 98 F (36.7 C) 97.9 F (36.6 C) 98.6 F (37 C)  TempSrc: Oral Oral Oral Oral  SpO2:  96% 96% 98%  Weight:   56 kg   Height:        Intake/Output Summary (Last 24 hours) at 01/01/2024 9177 Last data filed at 01/01/2024 0802 Gross per 24 hour  Intake 480 ml  Output --  Net 480 ml      01/01/2024    4:22 AM 12/31/2023    4:11 AM 12/30/2023    4:33 AM  Last 3 Weights  Weight (lbs) 123 lb 8 oz 123 lb 1.6 oz 125 lb 9.6 oz  Weight (kg) 56.019 kg 55.838 kg 56.972 kg      Telemetry/ECG  A pacing HR 63 - Personally Reviewed  Physical Exam  GEN: No acute distress.   Neck: No JVD Cardiac: RRR, no murmurs, rubs, or gallops. Radial pulses 2+ Respiratory: Clear to auscultation bilaterally. GI: Soft, nontender, non-distended  MS: No edema  Assessment & Plan  88 y.o. female ith a hx of paroxysmal atrial fibrillation, tachybrady syndrome status post PPM, hx of SVT, hypertension, hypertrophic cardiomyopathy, hx of LV thrombus while on eliquis , MR, TR, and hypothyroidism who had presented to outpatient cardiology appointment on 12/5 with evidence of acute heart failure. She was admitted for IV diuresis and AF management.     Acute systolic heart failure with RV dysfunction Hypertrophic cardiomyopathy Suspected to be 2/2 AF. Underwent DCCV on 12/8. See below.  Received IV diuresis with weight reduction. -20lbs.  Net IO Since Admission: -7,290 mL [01/01/24 0823] On exam, appears euvolemic   Hypotension and renal function improved after  stopping diuresis and entresto . Continue toprol  25 mg  Continue cozaar  25 mg  Continue jardiance  10 mg Would hold starting spironolactone given renal function and hypotension this admission, though could consider outpatient    Hx of LV thrombus Failed eliquis  Continue warfarin per pharmacy   Persistent atrial fibrillation Tachy-Brady s/p PPM Hx of SVT Underwent DCCV on 12/8 Currently AP Chad Vasc score 5   EP following. Appreciate their recommendations. Currently loading tikosyn .  Continue warfarin Continue toprol  as above   Hypertension BP: 108/66 Medications as above. Improved after stopping diuresis and entresto .   Hyperlipidemia Statin on hold with transaminitis,   MR  TR Given age and comorbidities, not a good candidate for transcatheter intervention Continue to monitor outpatient.     Per primary Hypothyroidism CKD   For questions or updates, please contact [facility] Please consult [email or web address] for contact info under       Signed, Leontine LOISE Salen, PA-C   Patient seen and examined.  Agree with above documentation.  On exam, patient is alert and oriented, regular rate and rhythm, no murmurs, lungs CTAB, no LE edema or JVD.  She is normotensive this morning, BP 108/66.  Creatinine improved to 0.89.  Will hold torsemide , plan as needed diuretic at discharge.  Unfortunately she has developed QT prolongation (501 ms QTc) on Tikosyn , will  need to stop.  Discussed with EP, can consider trialing amiodarone  again after Tikosyn  washout, as she tolerated amiodarone  well and while it was discontinued due to transaminitis, there are other potential causes of transaminitis and it was only mild.  May be worth trialing again, will d/w EP  [name], MD

## 2024-01-01 NOTE — Progress Notes (Signed)
 PHARMACY - ANTICOAGULATION CONSULT NOTE  Pharmacy Consult for warfarin Indication: atrial fibrillation and apical thrombus  Allergies  Allergen Reactions   Eliquis  [Apixaban ] Other (See Comments)    Doesn't Work for her. Developed a clot while taking it.   Wound Dressing Adhesive Rash    Patient Measurements: Height: 5' 2.5 (158.8 cm) Weight: 56 kg (123 lb 8 oz) IBW/kg (Calculated) : 51.25 HEPARIN  DW (KG): 64.3  Vital Signs: Temp: 97.9 F (36.6 C) (12/11 0422) Temp Source: Oral (12/11 0422) BP: 124/62 (12/11 0422) Pulse Rate: 66 (12/11 0422)  Labs: Recent Labs    12/30/23 0255 12/30/23 1738 12/31/23 0412 01/01/24 0420  HGB 12.9  --  14.3 13.7  HCT 38.7  --  43.5 41.9  PLT 310  --  329 309  LABPROT 27.2*  --  27.7* 25.7*  INR 2.4*  --  2.4* 2.2*  CREATININE 0.99 1.40* 1.20* 0.89    Estimated Creatinine Clearance: 36.7 mL/min (by C-G formula based on SCr of 0.89 mg/dL).   Medical History: Past Medical History:  Diagnosis Date   Anemia    Atrial fibrillation (HCC)    CHF (congestive heart failure) (HCC)    DVT (deep venous thrombosis) (HCC)    in her heart   Hyperlipidemia    Hypertension    Hypothyroidism    Irregular heart rate    Thrombus in heart chamber 04/11/2022    Assessment: 88 YOF presenting with persistent dyspnea and volume overload, hx of afib and apical thrombus on warfarin PTA, INR low end therapeutic on admission at 2.1, last dose taken 12/4. Warfarin resumed per pharmacy. Pt s/p DCCV 12/8 now undergoing Tikosyn  loading starting 12/9.  INR therapeutic at 2.2, CBC stable.  PTA dosing: 4mg  daily  Goal of Therapy:  INR 2-3 Monitor platelets by anticoagulation protocol: Yes   Plan:  Warfarin 4mg  daily  Daily INR, s/s bleeding   Ozell Jamaica, PharmD, BCPS, [facility] Clinical Pharmacist [phone] Please check AMION for all [facility] Pharmacy numbers 01/01/2024

## 2024-01-01 NOTE — Progress Notes (Signed)
°  Progress Note   Patient: Paula [name] FMW:[id number] DOB: Apr 05, 1935 DOA: 12/26/2023     6 DOS: the patient was seen and examined on 01/01/2024   Brief hospital course: Paula [name] was admitted to the hospital with the working diagnosis of heart failure exacerbation.   88 year old female with history of atrial fibrillation, sp pacemaker, hypertrophic cardiomyopathy, chronic hyponatremia, hypothyroidism who comes into the hospital with fluid overload directed from cardiology clinic. Recent hospitalization, November 29, and at that time her 2D echocardiogram showed new onset RV systolic dysfunction with moderately elevated PA pressure, and her LVEF decreased to 40 And 45% from prior normal values. She comes into the hospital with dyspnea on discharge, 10 pound weight gain and evidence of fluid overload.   12/11 on dofetilide  125 mg bid, with borderline qtc.   Assessment and Plan: * Acute on chronic diastolic CHF (congestive heart failure) (HCC) Echocardiogram with mild reduction in LV systolic function with EF 40 to 45%, global hypokinesis, severe asymmetric LVH of the apical segment, RV systolic function with mild reduction LA with moderate dilatation, RA with severe dilatation, moderate mitral regurgitation, severe tricuspid valve regurgitation, RVSP 52.7 mmHg,   Acute on chronic cor pulmonale Pulmonary hypertension.  Negative fluid balance since admission - 7,290 ml.   Continue medical therapy with losartan , metoprolol , and SGLT 2 inh.   Essential hypertension Continue blood pressure control with losartan  and metoprolol .   Persistent atrial fibrillation (HCC) Tachybradycardia syndrome sp pacemaker.   12/11 EKG 61 bpm, left axis deviation, qtc 501, atrial pacing and ventricular sensing, with no significant ST segment changes, negative T wave lead II, III, aVF, V1 to V6.   Continue with dofetilide  for rhythm control.  AV blockade with metoprolol  succinate 25 mg  Continue  anticoagulation with warfarin INR today 2.2 Continue telemetry monitoring   Chronic kidney disease, stage 3a (HCC) AKI Hyponatremia   Renal function today with serum cr at 0,89 with K at 4,4 and serum bicarbonate at 27  Na 131 and Mg 2.7  Continue close monitoring renal function and electrolytes   Hypothyroidism Continue with levothyroxine          Subjective: patient with no chest pain or dyspnea, no palpitations, no PND, orthopnea or edema   Physical Exam: Vitals:   12/31/23 2043 12/31/23 2310 01/01/24 0422 01/01/24 0757  BP: 134/64 (!) 125/59 124/62 108/66  Pulse: 65 65 66 72  Resp: 16 15 18 18   Temp: 98 F (36.7 C) 98 F (36.7 C) 97.9 F (36.6 C) 98.6 F (37 C)  TempSrc: Oral Oral Oral Oral  SpO2:  96% 96% 98%  Weight:   56 kg   Height:       Neurology awake and alert ENT with mild pallor Cardiovascular with S1 and S2 present and regular with no gallops, rubs or murmurs Respiratory with no rales or wheezing, no rhonchi  Abdomen with no distention  No lower extremity edema   Data Reviewed:    Family Communication: no family at the bedside   Disposition: Status is: Inpatient Remains inpatient appropriate because: qtc monitoring   Planned Discharge Destination: Home     Author: Elidia Toribio Furnace, MD 01/01/2024 11:40 AM  For on call review [email or web address].

## 2024-01-01 NOTE — Progress Notes (Addendum)
 Electrophysiology Rounding Note  Patient Name: Paula [name] Date of Encounter: 01/01/2024  Primary Cardiologist: Paula LITTIE Nanas, MD  Electrophysiologist: Dr. Inocencio   Subjective   Pt remains in NSR on Tikosyn  125 mcg BID   QTc from EKG last pm shows borderline QTc at in V1 (U wave)  The patient is doing well today.  At this time, the patient denies chest pain, shortness of breath, or any new concerns.  Inpatient Medications    Scheduled Meds:  dofetilide   125 mcg Oral BID   empagliflozin   10 mg Oral Daily   levothyroxine   137 mcg Oral Q0600   losartan   25 mg Oral Daily   magnesium  oxide  400 mg Oral Daily   metoprolol  succinate  25 mg Oral Daily   warfarin  4 mg Oral q1600   Warfarin - Pharmacist Dosing Inpatient   Does not apply q1600   Continuous Infusions:  PRN Meds: acetaminophen  **OR** acetaminophen , polyethylene glycol   Vital Signs    Vitals:   12/31/23 1607 12/31/23 2043 12/31/23 2310 01/01/24 0422  BP: 137/74 134/64 (!) 125/59 124/62  Pulse: 62 65 65 66  Resp: 18 16 15 18   Temp: 97.7 F (36.5 C) 98 F (36.7 C) 98 F (36.7 C) 97.9 F (36.6 C)  TempSrc: Oral Oral Oral Oral  SpO2: 100%  96% 96%  Weight:    56 kg  Height:        Intake/Output Summary (Last 24 hours) at 01/01/2024 0642 Last data filed at 12/31/2023 0830 Gross per 24 hour  Intake 600 ml  Output --  Net 600 ml   Filed Weights   12/30/23 0433 12/31/23 0411 01/01/24 0422  Weight: 57 kg 55.8 kg 56 kg    Physical Exam    GEN- pleasant adult female in NAD, A&O x 3. Normal affect.  Lungs- CTAB, Normal effort.  Heart- Regular rate and rhythm. No M/G/R GI- Soft, NT, ND Extremities- No clubbing, cyanosis, or edema Skin- no rash or lesion  Labs    CBC Recent Labs    12/31/23 0412 01/01/24 0420  WBC 5.7 5.1  HGB 14.3 13.7  HCT 43.5 41.9  MCV 85.8 86.2  PLT 329 309   Basic Metabolic Panel Recent Labs    87/89/74 0412 01/01/24 0420  NA 134*  131*  K 4.4 4.4  CL 98 99  CO2 30 27  GLUCOSE 83 79  BUN 17 15  CREATININE 1.20* 0.89  CALCIUM  8.5* 8.3*  MG 2.4 2.7*    Telemetry    Intermittent AP 60-80 / SR 60-90's (personally reviewed)    EP Information:    DEVICE HISTORY:  Abbott Dual Chamber PPM implanted 07/17/20 for SSS / bradycardia    Arrhythmia / AAD / Pertinent EP Studies AF > initial dx 04/2019 by PCP (palpitations)  ZIO 07/14/19 > 47% AF burden, frequent SVT episodes Near Syncope 06/2019 > PPM insertion for tachybrady Amiodarone  2021 > ~9 or 10/2023 failed due to elevated LFT's  Drodedarone 10/24/2023 > 12/2023 breakthrough AF, stopped   DCCV 12/29/23 > 200j x1 with conversion to NSR Tikosyn  12/30/23 >   Patient Profile     Paula [name] is a 88 y.o. female with a history of paroxysmal atrial fibrillation, tachy-brady syndrome s/p PPM, HFrEF, apical variant hypertrophic cardiomyopathy & hypothyroidism re-admitted for decompensated systolic heart failure.  She developed AF in mid September 2025 - she had been on amiodarone  for some time (dating back to at least  2021). She had transaminitis and amiodarone  was stopped in late Sept/early Oct (per pt).  She was transitioned to dronedarone .  ECHO showed reduced LVEF in 11/2023 40-45% during admit for decompensated systolic HF. She was re-admitted for IV diuresis and weight is down from 65.9kg > 57.6 kg. She underwent cardioversion on 12/29/23 with 200 j x1 restoring NSR. EP consulted, dronedarone  was stopped with concern for reduced LVEF (?if tachy induced with AF, V -rates 70-160bpm when in AF by histogram).  Tikosyn  loading initiated on 12/9 PM   Assessment & Plan    Persistent Atrial Fibrillation Pt remains in NSR on Tikosyn  125 mcg BID  QTc borderline, await post am dose Tikosyn  EKG Continue Coumadin  Creatinine, ser  0.89 (12/11 0420) Magnesium   2.7* (12/11 0420) Potassium4.4 (12/11 0420) No electrolyte supplementation needed (no additional)  Secondary  Hypercoagulable State  -continue Coumadin  per Pharmacy   Tachy-Brady Syndrome, SSS s/p PPM  -intermittent AP 60 on monitor noted          Apical Variant Hypertrophic Cardiomyopathy  Acute Systolic Heart Failure  LV Mural Thrombus  VHD: TV / MV Insufficiency  -follows with Dr. Kate  -monitor wt, I/O's closely    Plan for home Friday if QTc remains stable.     For questions or updates, please contact [facility] Please consult [email or web address] for contact info under Cardiology/STEMI.  Signed, Paula Barrack, NP-C, AGACNP-BC [facility] - Electrophysiology  01/01/2024, 6:45 AM      I have seen and examined this patient with Paula [name].  Agree with above, note added to reflect my findings.  Patient feeling well.  Remains in sinus rhythm.  QTc has become more prolonged.  GEN: No acute distress.   Neck: No JVD Cardiac: RRR, no murmurs, rubs, or gallops.  Respiratory: normal BS bases bilaterally. GI: Soft, nontender, non-distended  MS: No edema; No deformity. Neuro:  Nonfocal  Skin: warm and dry, device site well healed Psych: Normal affect    Persistent atrial fibrillation: Remains in sinus rhythm.  No dofetilide  125 mcg twice daily.  QTc is borderline.  Paula [name] potentially need to make adjustments after the morning dose.  If her QTc remains prolonged, this is likely a failure of dofetilide  and we Paula [name] need to go back on Multaq . Secondary hypercoagulable state: On Coumadin  Tachybradycardia syndrome: Post pacemaker Apical variant hypertrophic cardiomyopathy with reduced systolic function: Plan per primary cardiology and primary team.  Paula [name] M. Paula Osbourn MD 01/01/2024 9:09 AM

## 2024-01-01 NOTE — Progress Notes (Signed)
 Pharmacy: Dofetilide  (Tikosyn ) - Follow Up Assessment and Electrolyte Replacement  Pharmacy consulted to assist in monitoring and replacing electrolytes in this 88 y.o. female admitted on 12/26/2023 undergoing dofetilide  initiation. First dofetilide  dose: 12/30/23  Labs:    Component Value Date/Time   K 4.4 01/01/2024 0420   MG 2.7 (H) 01/01/2024 0420     Plan: Potassium: K >/= 4: No additional supplementation needed  Magnesium : Mg > 2: No additional supplementation needed     Ozell Jamaica, PharmD, BCPS, [facility] Clinical Pharmacist [phone] Please check AMION for all [facility] Pharmacy numbers 01/01/2024

## 2024-01-02 ENCOUNTER — Other Ambulatory Visit (HOSPITAL_COMMUNITY): Payer: Self-pay

## 2024-01-02 DIAGNOSIS — I4819 Other persistent atrial fibrillation: Secondary | ICD-10-CM | POA: Diagnosis not present

## 2024-01-02 DIAGNOSIS — N1831 Chronic kidney disease, stage 3a: Secondary | ICD-10-CM | POA: Diagnosis not present

## 2024-01-02 DIAGNOSIS — I495 Sick sinus syndrome: Secondary | ICD-10-CM | POA: Diagnosis not present

## 2024-01-02 DIAGNOSIS — Z950 Presence of cardiac pacemaker: Secondary | ICD-10-CM | POA: Diagnosis not present

## 2024-01-02 DIAGNOSIS — I5033 Acute on chronic diastolic (congestive) heart failure: Secondary | ICD-10-CM | POA: Diagnosis not present

## 2024-01-02 DIAGNOSIS — I5023 Acute on chronic systolic (congestive) heart failure: Secondary | ICD-10-CM | POA: Diagnosis not present

## 2024-01-02 DIAGNOSIS — I10 Essential (primary) hypertension: Secondary | ICD-10-CM | POA: Diagnosis not present

## 2024-01-02 LAB — BASIC METABOLIC PANEL WITH GFR
Anion gap: 6 (ref 5–15)
BUN: 15 mg/dL (ref 8–23)
CO2: 24 mmol/L (ref 22–32)
Calcium: 8.7 mg/dL — ABNORMAL LOW (ref 8.9–10.3)
Chloride: 102 mmol/L (ref 98–111)
Creatinine, Ser: 1.12 mg/dL — ABNORMAL HIGH (ref 0.44–1.00)
GFR, Estimated: 48 mL/min — ABNORMAL LOW (ref >60)
Glucose, Bld: 83 mg/dL (ref 70–99)
Potassium: 4.7 mmol/L (ref 3.5–5.1)
Sodium: 132 mmol/L — ABNORMAL LOW (ref 135–145)

## 2024-01-02 LAB — PROTIME-INR
INR: 2.3 — ABNORMAL HIGH (ref 0.8–1.2)
Prothrombin Time: 26.7 s — ABNORMAL HIGH (ref 11.4–15.2)

## 2024-01-02 MED ORDER — AMIODARONE HCL 200 MG PO TABS
ORAL_TABLET | ORAL | 2 refills | Status: DC
  Filled 2024-01-02: qty 90, 90d supply, fill #0

## 2024-01-02 MED ORDER — EMPAGLIFLOZIN 10 MG PO TABS
10.0000 mg | ORAL_TABLET | Freq: Every day | ORAL | 0 refills | Status: DC
  Filled 2024-01-02: qty 30, 30d supply, fill #0

## 2024-01-02 MED ORDER — FUROSEMIDE 40 MG PO TABS
40.0000 mg | ORAL_TABLET | Freq: Every day | ORAL | 11 refills | Status: AC | PRN
  Filled 2024-01-02: qty 90, 90d supply, fill #0

## 2024-01-02 MED ORDER — LOSARTAN POTASSIUM 25 MG PO TABS
25.0000 mg | ORAL_TABLET | Freq: Every day | ORAL | 0 refills | Status: DC
  Filled 2024-01-02: qty 30, 30d supply, fill #0

## 2024-01-02 NOTE — Progress Notes (Addendum)
 Patient Name: Paula [name] Date of Encounter: 01/02/2024  Primary Cardiologist: [name], MD Electrophysiologist: Dr. Inocencio   Interval Summary   The patient is doing well today.  Reports she is anxious to go home. At this time, the patient denies chest pain, shortness of breath, or any new concerns.  Vital Signs    Vitals:   01/01/24 1646 01/01/24 2020 01/02/24 0002 01/02/24 0500  BP: (!) 134/53 (!) 129/58 117/64 111/64  Pulse: 72 61 65 63  Resp: 14 18 18 18   Temp: 98.6 F (37 C) 98 F (36.7 C) 97.9 F (36.6 C) 97.8 F (36.6 C)  TempSrc: Oral Oral Oral Oral  SpO2: 100% 100% 100% 97%  Weight:    55.7 kg  Height:        Intake/Output Summary (Last 24 hours) at 01/02/2024 0719 Last data filed at 01/01/2024 0802 Gross per 24 hour  Intake 240 ml  Output --  Net 240 ml   Filed Weights   12/31/23 0411 01/01/24 0422 01/02/24 0500  Weight: 55.8 kg 56 kg 55.7 kg    Physical Exam    GEN- pleasant elderly female in NAD, Alert and oriented  Lungs- Clear to ausculation bilaterally, normal work of breathing Cardiac- Regular rate and rhythm, no murmurs, rubs or gallops GI- soft, NT, ND, + BS Extremities- no clubbing or cyanosis. No edema  Telemetry    AP 60, SR 60-70's, occ PVC (personally reviewed)   EP Information:    DEVICE HISTORY:  [employer]ott Dual Chamber PPM implanted 07/17/20 for SSS / bradycardia    Arrhythmia / AAD / Pertinent EP Studies AF > initial dx 04/2019 by PCP (palpitations)  ZIO 07/14/19 > 47% AF burden, frequent SVT episodes Near Syncope 06/2019 > PPM insertion for tachybrady Amiodarone  2021 > ~9 or 10/2023 failed due to elevated LFT's  Drodedarone 10/24/2023 > 12/2023 breakthrough AF, stopped   DCCV 12/29/23 > 200j x1 with conversion to NSR Tikosyn  12/30/23 > 01/01/24 failed due to QT prolongation   Hospital Course    Paula [name] is a 88 y.o. female with a history of paroxysmal atrial fibrillation, tachy-brady  syndrome s/p PPM, HFrEF, apical variant hypertrophic cardiomyopathy & hypothyroidism re-admitted for decompensated systolic heart failure.  She developed AF in mid September 2025 - she had been on amiodarone  for some time (dating back to at least 2021). She had transaminitis and amiodarone  was stopped in late Sept/early Oct (per pt).  She was transitioned to dronedarone .  ECHO showed reduced LVEF in 11/2023 40-45% during admit for decompensated systolic HF. She was re-admitted for IV diuresis and weight is down from 65.9kg > 57.6 kg. She underwent cardioversion on 12/29/23 with 200 j x1 restoring NSR. EP consulted, dronedarone  was stopped with concern for reduced LVEF (?if tachy induced with AF, V -rates 70-160bpm when in AF by histogram).  Tikosyn  loading initiated on 12/9 PM > QTc prolonged and Tikosyn  was stopped 01/01/24 with plans to re-load on amiodarone  after wash out.     Assessment & Plan    Persistent Atrial Fibrillation  -remains in NSR  -EKG with NSR, QTc 405 ms  -plan to begin amiodarone  200 mg BID x7 days, then 100 mg daily on 01/03/24 (to allow for Tikosyn  washout) -pt Paula [name] need LFT's in 03/2024  -OAC for stroke prophylaxis  -plan for follow up with Paula [name] on 01/27/23 as scheduled with EKG  Secondary Hypercoagulable State  -continue Coumadin  per pharmacy   Tachy-Brady Syndrome, SSS s/p PPM  -  appropriate intermittent AP on monitor  Apical Variant Hypertrophic Cardiomyopathy  Acute Systolic Heart Failure  LV Mural Thrombus  VHD: TV / MV Insufficiency  -per Cardiology  -follows with Dr. Kate -follow wt, I/O's closely    Ok to discharge home from EP perspective.    For questions or updates, please contact Paula [name] Please consult [email or web address] for contact info under     Signed, Daphne Barrack, NP-C, AGACNP-BC [place] [name] - Electrophysiology  01/02/2024, 7:20 AM   I have seen and examined this patient with Daphne Barrack.  Agree with above,  note added to reflect my findings.  Patient feeling well today.  Remains in sinus rhythm.  No acute complaints.  QTc on EKG, reviewed today, improved  GEN: No acute distress.   Neck: No JVD Cardiac: Regular Respiratory: Normal work of breathing GI: non-distended  MS: No edema; No deformity. Neuro:  Nonfocal  Skin: warm and dry, device site well healed Psych: Normal affect    Persistent atrial fibrillation: Remains in sinus rhythm.  Did have a cardioversion this admission.  Initial attempt for dofetilide  load with significantly prolonged QTc.  Bailyn Spackman plan for amiodarone  load, starting tomorrow at 200 mg twice daily for 1 week followed by 100 mg daily.  She does have EP follow-up arranged. Secondary hypercoagulable state: Continue Coumadin  Tachybradycardia syndrome: Post pacemaker  Apical variant hypertrophic cardiomyopathy Acute systolic heart failure  Paula [name] M. Kee Drudge MD 01/02/2024 9:21 AM

## 2024-01-02 NOTE — Discharge Summary (Signed)
 Physician Discharge Summary   Patient: Paula [name] MRN: [id number] DOB: 11/16/1935  Admit date:     12/26/2023  Discharge date: 01/02/2024  Discharge Physician: Elidia Sieving Aliviana Burdell   PCP: Charlott Dorn LABOR, MD   Recommendations at discharge:    Patient has been placed on amiodarone  200 mg daily for one week and then reduced to 100 mg daily. Discontinue diltiazem  and dronedarone   Valsartan  changed to losartan  to avoid hypotension, added SGLT 2 inh and plan to continue metoprolol  succinate.  Changed furosemide  to 40 mg as needed for fluid volume overload, weight gain 2 to 3 lbs in 24 hrs or 5 lbs in 7 days.  Follow up renal function and electrolytes in 7 days Follow up thyroid  function testing in 2 to 3 weeks.  Follow up with warfarin clinic  Follow up with Dr Charlott in 7 to 10 days  Follow up with Cardiology as scheduled.   Discharge Diagnoses: Principal Problem:   Acute on chronic diastolic CHF (congestive heart failure) (HCC) Active Problems:   Essential hypertension   Persistent atrial fibrillation (HCC)   Chronic kidney disease, stage 3a (HCC)   Hypothyroidism   Acute on chronic systolic heart failure (HCC)  Resolved Problems:   * No resolved hospital problems. [facility] Course: Paula [name] was admitted to the hospital with the working diagnosis of heart failure exacerbation.   88 year old female with history of atrial fibrillation, sp pacemaker, hypertrophic cardiomyopathy, chronic hyponatremia, hypothyroidism who presented with dyspnea.  Recent hospitalization,11/27 to 12/20/23, for heart failure exacerbation, she was diuresed and discharged on 20 mg furosemide  to take daily.  At home she had recurrent dyspnea and worsening edema. On the day of admission she was evaluated at the outpatient cardiology clinic, she was found hypervolemic and was referred to the ED.  On her initial physical examination her blood pressure was 118/86, HR 74, RR 17 and 02  saturation 100%  Lungs with no wheezing or rhonchi, heart with S1 and S2 present and regular, positive JVD, abdomen with no distention and positive lower extremity edema.   Na 133, K 4.2 Cl 98 bicarbonate 25 glucose 98 bun 15 cr 1,12 Mg 2,1  BNP 763  Wbc 6,1 hgb 11.2 plt 296  TSH 13,8 free T4 1,11  INR 2.1   Chest radiograph with positive cardiomegaly, bilateral hilar vascular congestion, and very small bilateral pleural effusions.   EKG 76 bpm, right axis deviation, qtc 459, atrial fibrillation with ventricular pacing, no significant ST segment changes, negative T wave lead V3 to V6.   Patient was placed on IV furosemide  for diuresis.    12/08 direct current cardioversion 200, converting to sinus rhythm.  12/11 placed on dofetilide  125 mg bid, but resulted in significant prolongation of qtc  12/12 decision was made to transition to amiodarone  and follow up as outpatient.   Assessment and Plan: * Acute on chronic diastolic CHF (congestive heart failure) (HCC) Echocardiogram with mild reduction in LV systolic function with EF 40 to 45%, global hypokinesis, severe asymmetric LVH of the apical segment, RV systolic function with mild reduction LA with moderate dilatation, RA with severe dilatation, moderate mitral regurgitation, severe tricuspid valve regurgitation, RVSP 52.7 mmHg,  Hypertrophic cardiomyopathy   Acute on chronic cor pulmonale Pulmonary hypertension.  Patient was placed on IV furosemide  for diuresis, negative negative fluid balance was achieved - 7,290 ml, with significant improvement in her symptoms.   Continue medical therapy with losartan , metoprolol , and SGLT 2 inh.  Loop diuretic  to use as needed in case of volume overload.   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  Anticoagulation has been on warfarin   Essential hypertension Continue blood pressure  control with losartan  and metoprolol .   Persistent atrial fibrillation (HCC) Tachybradycardia syndrome sp pacemaker.   12/08 direct current cardioversion  12/11 EKG 61 bpm, left axis deviation, qtc 501, atrial pacing and ventricular sensing, with no significant ST segment changes, negative T wave lead II, III, aVF, V1 to V6.  At the time of her discharge her telemetry is showing sinus rhythm with intermittent atrial sensing and ventricular pacing.   Patient had a trial of dofetilide  for rhythm control, unfortunately had prolongation of qtc.  Decision was made to start patient on amiodarone  oral load on 12/13  Continue AV blockade with metoprolol  succinate 25 mg  Continue anticoagulation with warfarin INR today 2.3 Follow up with EP as outpatient.   Chronic kidney disease, stage 3a (HCC) AKI Hyponatremia   At the time of her discharge her serum cr is 1,12 with K at 4,7 and serum bicarbonate at 24  Na 132 Plan to follow up renal function and electrolytes as outpatient.  Loop diuretic as needed for signs of volume overload.   Hypothyroidism Continue with levothyroxine   Very high TSH, but recent free T4 within normal levels.  Plan to check thyroid  function testing as outpatient.        Consultants: cardiology and EP  Procedures performed: direct current cardioversion   Disposition: Home Diet recommendation:  Cardiac diet DISCHARGE MEDICATION: Allergies as of 01/02/2024       Reactions   Eliquis  [apixaban ] Other (See Comments)   Doesn't Work for her. Developed a clot while taking it.   Wound Dressing Adhesive Rash        Medication List     STOP taking these medications    diltiazem  300 MG 24 hr capsule Commonly known as: CARDIZEM  CD   dronedarone  400 MG tablet Commonly known as: MULTAQ    prazosin  1 MG capsule Commonly known as: MINIPRESS    valsartan  160 MG tablet Commonly known as: DIOVAN        TAKE these medications    acetaminophen  325 MG  tablet Commonly known as: TYLENOL  Take 650 mg by mouth every 4 (four) hours as needed for moderate pain (pain score 4-6) or headache.   amiodarone  200 MG tablet Commonly known as: PACERONE  Take 1 tablet in AM and PM for 7 days, then decrease to 1/2 tablet daily.   Calcium  Carb-Cholecalciferol 600-5 MG-MCG Tabs Take 1 tablet by mouth daily at 6 (six) AM.   empagliflozin  10 MG Tabs tablet Commonly known as: JARDIANCE  Take 1 tablet (10 mg total) by mouth daily. Start taking on: January 03, 2024   furosemide  40 MG tablet Commonly known as: Lasix  Take 1 tablet (40 mg total) by mouth daily as needed for fluid or edema (as needed for weight gain 2 to 3 lbs in 24 hrs or 5 lbs in 7 days). What changed:  medication strength how much to take when to take this reasons to take this   levothyroxine  125 MCG tablet Commonly known as: SYNTHROID  Take 125 mcg by mouth daily before breakfast.   Linzess  290 MCG Caps capsule Generic drug: linaclotide  Take 1 capsule (290 mcg total) by mouth daily before breakfast.   losartan  25 MG tablet Commonly known as: COZAAR  Take 1 tablet (25 mg total) by  mouth daily. Start taking on: January 03, 2024   metoprolol  succinate 25 MG 24 hr tablet Commonly known as: Toprol  XL Take 1 tablet (25 mg total) by mouth daily. At night after dinner   multivitamin with minerals Tabs tablet Take 1 tablet by mouth daily.   Pepcid  AC 10 MG tablet Generic drug: famotidine  Take 10 mg by mouth at bedtime.   polyethylene glycol 17 g packet Commonly known as: MIRALAX  / GLYCOLAX  Take 17 g by mouth every evening.   sodium chloride  0.65 % Soln nasal spray Commonly known as: OCEAN Place 1 spray into both nostrils as needed for congestion.   triamcinolone cream 0.1 % Commonly known as: KENALOG Apply 1 Application topically 2 (two) times daily as needed (itching).   warfarin 4 MG tablet Commonly known as: COUMADIN  Take as directed. If you are unsure how to take  this medication, talk to your nurse or doctor. Original instructions: TAKE 1 TABLET BY MOUTH ONCE DAILY OR AS DIRECTED BY ANTICOAGULATION CLINIC What changed: See the new instructions.        Follow-up Information     Charlott Dorn LABOR, MD. Call in 1 week(s).   Specialty: Internal Medicine Contact information: [address] [place] [address] [phone]                Discharge Exam: Fredricka Weights   12/31/23 0411 01/01/24 0422 01/02/24 0500  Weight: 55.8 kg 56 kg 55.7 kg   BP 112/61 (BP Location: Left Arm)   Pulse 68   Temp 97.6 F (36.4 C) (Oral)   Resp 15   Ht 5' 2.5 (1.588 m)   Wt 55.7 kg   SpO2 97%   BMI 22.08 kg/m   Patient with no chest pain and no dyspnea, no PND, no palpitations   Neurology awake and alert ENT with mild pallor with no icterus Cardiovascular with S1 and S2 present and regular with no gallops or rubs, no rmumrurs Respiratory with no rales or wheezing, no gallops Abdomen with no distention  No lower extremity edema  Condition at discharge: stable  The results of significant diagnostics from this hospitalization (including imaging, microbiology, ancillary and laboratory) are listed below for reference.   Imaging Studies: EP STUDY Result Date: 12/29/2023 See surgical note for result.  DG Chest 2 View Result Date: 12/26/2023 CLINICAL DATA:  Chest pain EXAM: CHEST - 2 VIEW COMPARISON:  Prior chest x-ray 12/18/2023 FINDINGS: Left subclavian approach cardiac rhythm maintenance device remains in unchanged position. Mild cardiomegaly, stable. Atherosclerotic calcifications in the transverse aorta. Diffuse mild chronic bronchitic changes. No focal airspace infiltrate, pleural effusion, pulmonary edema or pneumothorax. Right basilar pleuroparenchymal scarring. No acute osseous abnormality. IMPRESSION: No active cardiopulmonary disease. Electronically Signed   By: Wilkie Lent M.D.   On: 12/26/2023 11:53    ECHOCARDIOGRAM COMPLETE Result Date: 12/19/2023    ECHOCARDIOGRAM REPORT   Patient Name:   LAMISHA ROUSSELL Date of Exam: 12/19/2023 Medical Rec #:  [id number]              Height:       62.0 in Accession #:    [id number]             Weight:       139.8 lb Date of Birth:  [birth year]             BSA:          1.642 m Patient Age:    89 years  BP:           100/69 mmHg Patient Gender: F                      HR:           71 bpm. Exam Location:  Inpatient Procedure: 2D Echo, Cardiac Doppler, Color Doppler and Strain Analysis (Both            Spectral and Color Flow Doppler were utilized during procedure). Indications:    CHF  History:        Patient has prior history of Echocardiogram examinations, most                 recent 04/03/2023. CHF, Pacemaker; Risk Factors:Hypertension and                 Dyslipidemia.  Sonographer:    Philomena Daring Referring Phys: [id number] EKTA V PATEL IMPRESSIONS  1. Left ventricular ejection fraction, by estimation, is 40 to 45%. The left ventricle has mildly decreased function. The left ventricle demonstrates global hypokinesis. There is severe asymmetric left ventricular hypertrophy of the apical segment. Diastology indeterminate due to atrial fibrillation.  2. Right ventricular systolic function is mildly reduced. The right ventricular size is normal. There is moderately elevated pulmonary artery systolic pressure.  3. Left atrial size was moderately dilated.  4. Right atrial size was severely dilated.  5. There is no evidence of cardiac tamponade.  6. The mitral valve is degenerative. Moderate mitral valve regurgitation. No evidence of mitral stenosis.  7. The tricuspid valve is degenerative. Tricuspid valve regurgitation is severe.  8. The aortic valve is tricuspid. Aortic valve regurgitation is not visualized. No aortic stenosis is present.  9. The inferior vena cava is dilated in size with <50% respiratory variability, suggesting right atrial pressure of 15  mmHg. Comparison(s): Changes from prior study are noted. New mildly reduced LV and RV systolic function. FINDINGS  Left Ventricle: Left ventricular ejection fraction, by estimation, is 40 to 45%. The left ventricle has mildly decreased function. The left ventricle demonstrates global hypokinesis. The left ventricular internal cavity size was normal in size. There is  severe asymmetric left ventricular hypertrophy of the apical segment. Abnormal (paradoxical) septal motion, consistent with left bundle branch block. Diastology indeterminate due to atrial fibrillation. Right Ventricle: The right ventricular size is normal. No increase in right ventricular wall thickness. Right ventricular systolic function is mildly reduced. There is moderately elevated pulmonary artery systolic pressure. The tricuspid regurgitant velocity is 3.07 m/s, and with an assumed right atrial pressure of 15 mmHg, the estimated right ventricular systolic pressure is 52.7 mmHg. Left Atrium: Left atrial size was moderately dilated. Right Atrium: Right atrial size was severely dilated. Pericardium: Trivial pericardial effusion is present. There is no evidence of cardiac tamponade. Mitral Valve: The mitral valve is degenerative in appearance. There is mild thickening of the mitral valve leaflet(s). Moderate mitral valve regurgitation. No evidence of mitral valve stenosis. Tricuspid Valve: The tricuspid valve is degenerative in appearance. Tricuspid valve regurgitation is severe. No evidence of tricuspid stenosis. Aortic Valve: The aortic valve is tricuspid. Aortic valve regurgitation is not visualized. No aortic stenosis is present. Aortic valve mean gradient measures 5.0 mmHg. Aortic valve peak gradient measures 8.8 mmHg. Aortic valve area, by VTI measures 1.30 cm. Pulmonic Valve: The pulmonic valve was normal in structure. Pulmonic valve regurgitation is trivial. No evidence of pulmonic stenosis. Aorta: The aortic root and ascending aorta are  structurally  normal, with no evidence of dilitation. Venous: The inferior vena cava is dilated in size with less than 50% respiratory variability, suggesting right atrial pressure of 15 mmHg. IAS/Shunts: No atrial level shunt detected by color flow Doppler. Additional Comments: A device lead is visualized.  LEFT VENTRICLE PLAX 2D LVIDd:         4.30 cm   Diastology LVIDs:         3.00 cm   LV e' medial:    6.31 cm/s LV PW:         0.80 cm   LV E/e' medial:  17.7 LV IVS:        1.10 cm   LV e' lateral:   8.81 cm/s LVOT diam:     2.00 cm   LV E/e' lateral: 12.7 LV SV:         37 LV SV Index:   23 LVOT Area:     3.14 cm  RIGHT VENTRICLE            IVC RV S prime:     7.40 cm/s  IVC diam: 2.70 cm TAPSE (M-mode): 1.5 cm LEFT ATRIUM             Index        RIGHT ATRIUM           Index LA diam:        4.30 cm 2.62 cm/m   RA Area:     24.20 cm LA Vol (A2C):   69.4 ml 42.27 ml/m  RA Volume:   77.70 ml  47.33 ml/m LA Vol (A4C):   74.3 ml 45.26 ml/m LA Biplane Vol: 74.1 ml 45.14 ml/m  AORTIC VALVE AV Area (Vmax):    1.66 cm AV Area (Vmean):   1.69 cm AV Area (VTI):     1.30 cm AV Vmax:           148.00 cm/s AV Vmean:          99.500 cm/s AV VTI:            0.286 m AV Peak Grad:      8.8 mmHg AV Mean Grad:      5.0 mmHg LVOT Vmax:         78.10 cm/s LVOT Vmean:        53.500 cm/s LVOT VTI:          0.118 m LVOT/AV VTI ratio: 0.41  AORTA Ao Root diam: 2.70 cm Ao Asc diam:  3.20 cm MITRAL VALVE                TRICUSPID VALVE MV Area (PHT): 3.11 cm     TR Peak grad:   37.7 mmHg MV Decel Time: 244 msec     TR Vmax:        307.00 cm/s MV E velocity: 112.00 cm/s                             SHUNTS                             Systemic VTI:  0.12 m                             Systemic Diam: 2.00 cm Georganna Archer Electronically signed by Georganna Archer Signature Date/Time: 12/19/2023/1:58:05 PM    Final  DG Chest Port 1 View Result Date: 12/18/2023 CLINICAL DATA:  Shortness of breath EXAM: PORTABLE CHEST 1 VIEW  COMPARISON:  12/11/2023 FINDINGS: Single frontal view of the chest demonstrates stable dual lead pacemaker. Cardiac silhouette is enlarged but stable. No acute airspace disease, effusion, or pneumothorax. No acute bony abnormalities. IMPRESSION: 1. Stable chest, no acute process. Electronically Signed   By: Ozell Daring M.D.   On: 12/18/2023 15:22   DG Chest 2 View Result Date: 12/11/2023 CLINICAL DATA:  Facial swelling and shortness of breath and cough. EXAM: CHEST - 2 VIEW COMPARISON:  01/16/2022 FINDINGS: The pacer wires are stable. No complicating features. The heart is borderline enlarged but stable. Stable tortuosity and calcification of the thoracic aorta. The lungs are clear of an acute process. No infiltrates, edema or effusions. Mild chronic left pleural thickening. No pulmonary lesions or pneumothorax. IMPRESSION: No acute cardiopulmonary findings. Electronically Signed   By: MYRTIS Stammer M.D.   On: 12/11/2023 14:36    Microbiology: Results for orders placed or performed during the hospital encounter of 12/18/23  Resp panel by RT-PCR (RSV, Flu A&B, Covid) Anterior Nasal Swab     Status: None   Collection Time: 12/18/23  3:19 PM   Specimen: Anterior Nasal Swab  Result Value Ref Range Status   SARS Coronavirus 2 by RT PCR NEGATIVE NEGATIVE Final    Comment: (NOTE) SARS-CoV-2 target nucleic acids are NOT DETECTED.  The SARS-CoV-2 RNA is generally detectable in upper respiratory specimens during the acute phase of infection. The lowest concentration of SARS-CoV-2 viral copies this assay can detect is 138 copies/mL. A negative result does not preclude SARS-Cov-2 infection and should not be used as the sole basis for treatment or other patient management decisions. A negative result may occur with  improper specimen collection/handling, submission of specimen other than nasopharyngeal swab, presence of viral mutation(s) within the areas targeted by this assay, and inadequate number  of viral copies(<138 copies/mL). A negative result must be combined with clinical observations, patient history, and epidemiological information. The expected result is Negative.  Fact Sheet for Patients:  bloggercourse.com  Fact Sheet for Healthcare Providers:  seriousbroker.it  This test is no t yet approved or cleared by the [place]  FDA and  has been authorized for detection and/or diagnosis of SARS-CoV-2 by FDA under an Emergency Use Authorization (EUA). This EUA will remain  in effect (meaning this test can be used) for the duration of the COVID-19 declaration under Section 564(b)(1) of the Act, 21 U.S.C.section 360bbb-3(b)(1), unless the authorization is terminated  or revoked sooner.       Influenza A by PCR NEGATIVE NEGATIVE Final   Influenza B by PCR NEGATIVE NEGATIVE Final    Comment: (NOTE) The Xpert Xpress SARS-CoV-2/FLU/RSV plus assay is intended as an aid in the diagnosis of influenza from Nasopharyngeal swab specimens and should not be used as a sole basis for treatment. Nasal washings and aspirates are unacceptable for Xpert Xpress SARS-CoV-2/FLU/RSV testing.  Fact Sheet for Patients: bloggercourse.com  Fact Sheet for Healthcare Providers: seriousbroker.it  This test is not yet approved or cleared by the [place]  FDA and has been authorized for detection and/or diagnosis of SARS-CoV-2 by FDA under an Emergency Use Authorization (EUA). This EUA will remain in effect (meaning this test can be used) for the duration of the COVID-19 declaration under Section 564(b)(1) of the Act, 21 U.S.C. section 360bbb-3(b)(1), unless the authorization is terminated or revoked.     Resp Syncytial Virus by PCR  NEGATIVE NEGATIVE Final    Comment: (NOTE) Fact Sheet for Patients: bloggercourse.com  Fact Sheet for Healthcare  Providers: seriousbroker.it  This test is not yet approved or cleared by the [place]  FDA and has been authorized for detection and/or diagnosis of SARS-CoV-2 by FDA under an Emergency Use Authorization (EUA). This EUA will remain in effect (meaning this test can be used) for the duration of the COVID-19 declaration under Section 564(b)(1) of the Act, 21 U.S.C. section 360bbb-3(b)(1), unless the authorization is terminated or revoked.  Performed at [facility], [address] [place], [place] [address]     Labs: CBC: Recent Labs  Lab 12/28/23 0254 12/29/23 0215 12/30/23 0255 12/31/23 0412 01/01/24 0420  WBC 6.3 7.4 6.7 5.7 5.1  HGB 12.8 12.7 12.9 14.3 13.7  HCT 39.0 38.7 38.7 43.5 41.9  MCV 85.2 85.6 86.6 85.8 86.2  PLT 295 318 310 329 309   Basic Metabolic Panel: Recent Labs  Lab 12/28/23 0254 12/29/23 0215 12/30/23 0255 12/30/23 1738 12/31/23 0412 01/01/24 0420 01/02/24 0325  NA 135 135 132* 134* 134* 131* 132*  K 4.0 4.1 3.9 4.4 4.4 4.4 4.7  CL 94* 98 98 97* 98 99 102  CO2 26 30 19* 28 30 27 24   GLUCOSE 89 91 78 116* 83 79 83  BUN 12 18 18 19 17 15 15   CREATININE 1.11* 1.13* 0.99 1.40* 1.20* 0.89 1.12*  CALCIUM  8.4* 7.9* 8.0* 8.2* 8.5* 8.3* 8.7*  MG 2.1 2.1 2.2 2.4 2.4 2.7*  --   PHOS 2.3*  --   --   --   --   --   --    Liver Function Tests: Recent Labs  Lab 12/28/23 0254 01/01/24 0925  AST 31 40  ALT 29 30  ALKPHOS 63 71  BILITOT 0.8 0.8  PROT 6.6 7.2  ALBUMIN 3.4* 3.5   CBG: No results for input(s): GLUCAP in the last 168 hours.  Discharge time spent: greater than 30 minutes.  Signed: Elidia Toribio Furnace, MD [facility] 01/02/2024

## 2024-01-02 NOTE — Progress Notes (Signed)
 PHARMACY - ANTICOAGULATION CONSULT NOTE  Pharmacy Consult for warfarin Indication: atrial fibrillation and apical thrombus  Allergies  Allergen Reactions   Eliquis  [Apixaban ] Other (See Comments)    Doesn't Work for her. Developed a clot while taking it.   Wound Dressing Adhesive Rash    Patient Measurements: Height: 5' 2.5 (158.8 cm) Weight: 55.7 kg (122 lb 11.2 oz) IBW/kg (Calculated) : 51.25 HEPARIN  DW (KG): 64.3  Vital Signs: Temp: 97.8 F (36.6 C) (12/12 0500) Temp Source: Oral (12/12 0500) BP: 111/64 (12/12 0500) Pulse Rate: 63 (12/12 0500)  Labs: Recent Labs    12/31/23 0412 01/01/24 0420 01/02/24 0325  HGB 14.3 13.7  --   HCT 43.5 41.9  --   PLT 329 309  --   LABPROT 27.7* 25.7* 26.7*  INR 2.4* 2.2* 2.3*  CREATININE 1.20* 0.89 1.12*    Estimated Creatinine Clearance: 29.2 mL/min (A) (by C-G formula based on SCr of 1.12 mg/dL (H)).   Medical History: Past Medical History:  Diagnosis Date   Anemia    Atrial fibrillation (HCC)    CHF (congestive heart failure) (HCC)    DVT (deep venous thrombosis) (HCC)    in her heart   Hyperlipidemia    Hypertension    Hypothyroidism    Irregular heart rate    Thrombus in heart chamber 04/11/2022    Assessment: 88 YOF presenting with persistent dyspnea and volume overload, hx of afib and apical thrombus on warfarin PTA, INR low end therapeutic on admission at 2.1, last dose taken 12/4. Warfarin resumed per pharmacy. Pt s/p DCCV 12/8 now undergoing Tikosyn  loading starting 12/9.  INR therapeutic at 2.3.  PTA dosing: 4mg  daily  Goal of Therapy:  INR 2-3 Monitor platelets by anticoagulation protocol: Yes   Plan:  Warfarin 4mg  daily  Daily INR, s/s bleeding   Ozell Jamaica, PharmD, BCPS, [facility] Clinical Pharmacist [phone] Please check AMION for all [facility] Pharmacy numbers 01/02/2024

## 2024-01-02 NOTE — Progress Notes (Addendum)
 Progress Note  Patient Name: Paula [name] Date of Encounter: 01/02/2024 [facility] Cardiologist: [name], MD   Interval Summary   Denied HF symptoms. In good spirits despite Tikosyn  fail, eager to get home  Vital Signs Vitals:   01/01/24 2020 01/02/24 0002 01/02/24 0500 01/02/24 0750  BP: (!) 129/58 117/64 111/64   Pulse: 61 65 63 65  Resp: 18 18 18 15   Temp: 98 F (36.7 C) 97.9 F (36.6 C) 97.8 F (36.6 C) 97.6 F (36.4 C)  TempSrc: Oral Oral Oral Oral  SpO2: 100% 100% 97% 97%  Weight:   55.7 kg   Height:        Intake/Output Summary (Last 24 hours) at 01/02/2024 0754 Last data filed at 01/01/2024 0802 Gross per 24 hour  Intake 240 ml  Output --  Net 240 ml      01/02/2024    5:00 AM 01/01/2024    4:22 AM 12/31/2023    4:11 AM  Last 3 Weights  Weight (lbs) 122 lb 11.2 oz 123 lb 8 oz 123 lb 1.6 oz  Weight (kg) 55.656 kg 56.019 kg 55.838 kg      Telemetry/ECG  Sinus rhythm with occasional AP HR ~60 when pacing and HR ~77 in sinus, one episode of NSVT ~ 4 beats - Personally Reviewed  Physical Exam  GEN: No acute distress.   Neck: No JVD Cardiac: RRR, no murmurs, rubs, or gallops.  Respiratory: Clear to auscultation bilaterally. GI: Soft, nontender, non-distended  MS: No edema  Assessment & Plan  88 y.o. female ith a hx of paroxysmal atrial fibrillation, tachybrady syndrome status post PPM, hx of SVT, hypertension, hypertrophic cardiomyopathy, hx of LV thrombus while on eliquis , MR, TR, and hypothyroidism who had presented to outpatient cardiology appointment on 12/5 with evidence of acute heart failure. She was admitted for IV diuresis and AF management.     Acute systolic heart failure with RV dysfunction Hypertrophic cardiomyopathy Suspected to be 2/2 AF. Underwent DCCV on 12/8. See below.  Weight has remained stable. ~123lb Net IO Since Admission: -7,290 mL [01/02/24 0758] On exam, appears euvolemic   Continue  toprol  25 mg  Continue cozaar  25 mg  Continue jardiance  10 mg Would hold starting spironolactone given renal function and hypotension this admission, though could consider outpatient Would send home with as needed Lasix  40 mg for weight gain    Hx of LV thrombus Failed eliquis  Continue warfarin per pharmacy   Persistent atrial fibrillation Tachy-Brady s/p PPM Hx of SVT Underwent DCCV on 12/8 Currently sinus with intermittent AP Chad Vasc score 5  Failed Tikosyn  2/2 prolonged Qtc. EP following. Appreciate their recommendations.  Starting amiodarone  12/13. Continue warfarin Continue toprol  as above   Hypertension BP: 112/61 Medications as above.   Hyperlipidemia Transaminitis resolved, thought will hold on statin as she will be restarting amiodarone  12/13.   MR  TR Given age and comorbidities, not a good candidate for transcatheter intervention Continue to monitor outpatient.     Per primary Hypothyroidism CKD  [facility] will sign off.   Medication Recommendations: Start amiodarone  200 mg twice daily tomorrow, would continue for 1 week and then start amiodarone  100 mg once daily on 12/20.  Continue losartan  25 mg daily, Toprol -XL 25 mg daily, Jardiance  10 mg daily.  Would recommend monitoring daily weights and can take Lasix  40 mg as needed if gains more than 3 pounds in 1 day or 5 pounds in 1 week Other recommendations (labs,  testing, etc):  None Follow up as an outpatient:  Has f/u  scheduled on 12/18.    For questions or updates, please contact [facility] Please consult [email or web address] for contact info under       Signed, Leontine LOISE Salen, PA-C   Patient seen and examined.  Agree with above documentation.  On exam, patient is alert and oriented, regular rate and rhythm, no murmurs, lungs CTAB, no LE edema or JVD.  Discussed with EP, planning to rechallenge with amiodarone  since failed Tikosyn  due to QTc prolongation.  Plan to start amiodarone   tomorrow, load with 200 mg twice daily x 1 week then decrease to 100 mg daily.  Has follow-up with me next week, will check EKG at that time to monitor QTc.  She appears euvolemic, will discharge on as needed Lasix .  She is maintaining sinus rhythm.  [name], MD

## 2024-01-05 ENCOUNTER — Other Ambulatory Visit: Payer: Self-pay

## 2024-01-06 ENCOUNTER — Other Ambulatory Visit: Payer: Self-pay | Admitting: Pulmonary Disease

## 2024-01-06 LAB — AMIODARONE LEVEL
Amiodarone Lvl: 103 ng/mL — ABNORMAL LOW (ref 1000–2500)
N-Desethyl-Amiodarone: 254 ng/mL

## 2024-01-07 MED ORDER — METOPROLOL SUCCINATE ER 25 MG PO TB24: 25.0000 mg | ORAL_TABLET | Freq: Every day | ORAL | 3 refills | Status: AC

## 2024-01-07 NOTE — Progress Notes (Unsigned)
 Cardiology Office Note:    Date:  01/08/2024   ID:  [name], DOB April 20, 1935, MRN [id number]  PCP:  Charlott Dorn LABOR, MD  Cardiologist:  [name], MD  Electrophysiologist:  OLE ONEIDA HOLTS, MD   Referring MD: Charlott Dorn LABOR, MD   Chief Complaint  Patient presents with   Congestive Heart Failure    History of Present Illness:    Paula [name] is a 88 y.o. female with a hx of paroxysmal atrial fibrillation, tachybrady syndrome status post PPM, hypertension, hypothyroidism who presents for follow-up.  She was referred by Quita Kicks, PA for evaluation of hypertrophic cardiomyopathy, initially seen on 06/14/2019.  Diagnosed with atrial fibrillation after presenting with palpitations to PCP office in April 2021. EKG showed A. fib with RVR. Started on Eliquis  given CHA2DS2-VASc score 5.  Also started on diltiazem  for rate control. She was seen in AF clinic on 05/11/2019, she was noted to be in normal sinus rhythm. She was continued on Eliquis  5 mg twice daily and diltiazem  180 mg daily. TTE on 05/27/19 was concerning for apical variant hypertrophic cardiomyopathy, EF 60 to 65%, normal RV function, moderate elevation in RVSP (48 mmHg), mild mitral regurgitation, mild to moderate tricuspid regurgitation.  Cardiac MRI on 07/05/2019 showed LV apical hypertrophy measuring up to 15 mm, consistent with apical hypertrophic cardiomyopathy, small apical aneurysm, patchy apical LGE accounting for 1% of total myocardial mass, hyperdynamic LV systolic function, normal RV function, mild MR (regurgitant fraction 24%).  Zio patch x7 days on 07/14/2019 showed AF burden 47%, with episode lasting 3 days and average rate 118 bpm, no NSVT, frequent episodes of SVT, longest lasting 19 seconds.  She was admitted in June 2022 with near syncope, underwent PPM for tachybrady syndrome.  Echo 02/11/2022 showed EF 60 to 65%, severe asymmetric LV apical hypertrophy, normal RV function,  severe left atrial enlargement, moderate to severe mitral regurgitation, moderate to severe tricuspid regurgitation.  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  Echocardiogram 03/2023 shows EF 60 to 65%, normal RV function, RVSP 61 mmHg, severe left atrial enlargement, moderate MR, severe TR, mild aortic stenosis, RAP 3.  She was admitted 12/5 through 01/02/2024.  Diuresed 20 pounds of fluid.  Underwent successful cardioversion.  EP was consulted and recommended loading with Tikosyn , but unfortunately developed QTc prolongation and Tikosyn  was discontinued.  She was started on amiodarone .  Since discharge from hospital, she reports she is doing well.  States that has been monitoring her weight since discharge and has been stable at 122 pounds. Denies any chest pain, lightheadedness, syncope, lower extremity edema, or palpitations. Reports dyspnea improved.  Reports he is enrolled in home BP monitoring through [facility] and has been 110s to 120s over 50s to 60s, her BP is send and each morning.   Wt Readings from Last 3 Encounters:  01/08/24 126 lb (57.2 kg)  01/02/24 122 lb 11.2 oz (55.7 kg)  12/26/23 145 lb 9.6 oz (66 kg)   BP Readings from Last 3 Encounters:  01/08/24 (!) 170/80  01/02/24 112/61  12/26/23 100/68     Past Medical History:  Diagnosis Date   Anemia    Atrial fibrillation (HCC)    CHF (congestive heart failure) (HCC)  DVT (deep venous thrombosis) (HCC)    in her heart   Hyperlipidemia    Hypertension    Hypothyroidism    Irregular heart rate    Thrombus in heart chamber 04/11/2022    Past Surgical History:  Procedure Laterality Date   APPENDECTOMY     in  early 1970's   CARDIOVERSION N/A 12/29/2023   Procedure: CARDIOVERSION;  Surgeon: Shlomo Wilbert SAUNDERS, MD;  Location: [facility] INVASIVE CV LAB;  Service: Cardiovascular;  Laterality: N/A;   PACEMAKER IMPLANT N/A 07/17/2020   Procedure: PACEMAKER IMPLANT;  Surgeon: Waddell Danelle ORN, MD;  Location: [facility] INVASIVE CV LAB;  Service: Cardiovascular;  Laterality: N/A;   salvia gland  2012   Dr Carlie   VAGINAL HYSTERECTOMY     partial    Current Medications: Current Meds  Medication Sig   acetaminophen  (TYLENOL ) 325 MG tablet Take 650 mg by mouth every 4 (four) hours as needed for moderate pain (pain score 4-6) or headache.   amiodarone  (PACERONE ) 200 MG tablet Take 1 tablet in AM and PM for 7 days, then decrease to 1/2 tablet daily.   Calcium  Carb-Cholecalciferol 600-5 MG-MCG TABS Take 1 tablet by mouth daily at 6 (six) AM.   famotidine  (PEPCID  AC) 10 MG tablet Take 10 mg by mouth at bedtime.   furosemide  (LASIX ) 40 MG tablet Take 1 tablet (40 mg total) by mouth daily as needed for fluid or edema (as needed for weight gain 2 to 3 lbs in 24 hrs or 5 lbs in 7 days).   levothyroxine  (SYNTHROID ) 125 MCG tablet Take 125 mcg by mouth daily before breakfast. (Patient taking differently: Take 137 mcg by mouth daily before breakfast.)   linaclotide  (LINZESS ) 290 MCG CAPS capsule Take 1 capsule (290 mcg total) by mouth daily before breakfast.   metoprolol  succinate (TOPROL  XL) 25 MG 24 hr tablet Take 1 tablet (25 mg total) by mouth daily. At night after dinner   Multiple Vitamin (MULTIVITAMIN WITH MINERALS) TABS tablet Take 1 tablet by mouth daily.   polyethylene glycol (MIRALAX  / GLYCOLAX ) 17 g packet Take 17 g by mouth every evening.   sodium chloride  (OCEAN) 0.65 % SOLN nasal spray Place 1 spray into both nostrils as needed for congestion.   triamcinolone cream (KENALOG) 0.1 % Apply 1 Application topically 2 (two) times daily as needed (itching).   warfarin (COUMADIN ) 4 MG tablet TAKE 1 TABLET BY MOUTH ONCE DAILY OR  AS DIRECTED BY ANTICOAGULATION CLINIC (Patient taking differently: Take 4 mg by mouth daily at 4 PM. Take 1 tablet by mouth once daily or as directed by anticoagulation clinic)   [DISCONTINUED] empagliflozin  (JARDIANCE ) 10 MG TABS tablet Take 1 tablet (10 mg total) by mouth daily.   [DISCONTINUED] losartan  (COZAAR ) 25 MG tablet Take 1 tablet (25 mg total) by mouth daily.     Allergies:   Eliquis  [apixaban ] and Wound dressing adhesive   Social History   Socioeconomic History   Marital status: Married    Spouse name: Beryl   Number of children: 1   Years of education: Not on file   Highest education level: Not on file  Occupational History   Not on file  Tobacco Use   Smoking status: Never   Smokeless tobacco: Never  Vaping Use   Vaping status: Never Used  Substance and Sexual Activity   Alcohol use: Never    Alcohol/week: 1.0 standard drink of alcohol    Types: 1 Glasses of wine per week   Drug use:  Never   Sexual activity: Not Currently    Partners: Male    Birth control/protection: Post-menopausal    Comment: married  Other Topics Concern   Not on file  Social History Narrative   Not on file   Social Drivers of Health   Tobacco Use: Low Risk (01/08/2024)   Patient History    Smoking Tobacco Use: Never    Smokeless Tobacco Use: Never    Passive Exposure: Not on file  Financial Resource Strain: Not on file  Food Insecurity: No Food Insecurity (12/26/2023)   Epic    Worried About Programme Researcher, Broadcasting/film/video in the Last Year: Never true    Ran Out of Food in the Last Year: Never true  Transportation Needs: No Transportation Needs (12/26/2023)   Epic    Lack of Transportation (Medical): No    Lack of Transportation (Non-Medical): No  Physical Activity: Not on file  Stress: Not on file  Social Connections: Socially Integrated (12/27/2023)   Social Connection and Isolation Panel    Frequency of Communication with Friends and Family: More than three times a week    Frequency of  Social Gatherings with Friends and Family: More than three times a week    Attends Religious Services: More than 4 times per year    Active Member of [employer] or Organizations: Yes    Attends Banker Meetings: More than 4 times per year    Marital Status: Married  Depression (EYV7-0): Not on file  Alcohol Screen: Not on file  Housing: Low Risk (12/27/2023)   Epic    Unable to Pay for Housing in the Last Year: No    Number of Times Moved in the Last Year: 0    Homeless in the Last Year: No  Utilities: Not At Risk (12/26/2023)   Epic    Threatened with loss of utilities: No  Health Literacy: Not on file     Family History: The patient's family history includes Breast cancer (age of onset: 19) in her sister; Cancer in her father; Other in her mother. There is no history of Colon cancer, Esophageal cancer, or Rectal cancer.  ROS:   Please see the history of present illness.     All other systems reviewed and are negative.  EKGs/Labs/Other Studies Reviewed:    The following studies were reviewed today:  Monitor 05/01/2020: No significant abnormalities   Patch Wear Time:  2 days and 23 hours (2022-04-01T16:38:40-0400 to 2022-04-04T16:10:58-0400)   Patient had a min HR of 35 bpm, max HR of 119 bpm, and avg HR of 61 bpm. Predominant underlying rhythm was Sinus Rhythm. 2 Supraventricular Tachycardia runs occurred, the run with the fastest interval lasting 4 beats with a max rate of 119 bpm, the longest lasting 4 beats with an avg rate of 98 bpm. Isolated SVEs were rare (<1.0%), SVE Couplets were rare (<1.0%), and SVE Triplets were rare (<1.0%). Isolated VEs were rare (<1.0%), and no VE Couplets or VE Triplets were present.  No patient triggered events  Monitor 07/14/2019: Episode of atrial fibrillation lasting 3 days 6 hours, average rate 118 bpm. AF burden 47%. 141 episodes of SVT, longest lasting 18.5 seconds.   7 days of data recorded on Zio monitor. Patient had a min HR of  55 bpm, max HR of 207 bpm, and avg HR of 95 bpm. Predominant underlying rhythm was Sinus Rhythm. No VT, high degree block, or pauses noted.  141 episodes of SVT, longest lasting 18.5 seconds.  Episode of atrial fibrillation lasting 3 days 6 hours, average rate 118 bpm.  AF burden 47%.   Isolated atrial and ventricular ectopy was rare (<1%). There were 0 triggered events.  CMR 07/05/2019: 1. LV hypertrophy at apex measuring up to 15mm, consistent with apical hypertrophic cardiomyopathy   2.  Small apical aneurysm   3. Patchy LGE at apex, consistent with apical HCM. LGE accounts for 1% of total myocardial mass   4.  Normal LV size with hyperdynamic systolic function (EF 70%)   5.  Normal RV size and systolic function (EF 58%)   6. Mild mitral regurgitation (regurgitant volume 16cc, regurgitant fraction 24%)  Echo 05/27/2019: 1. Apical variant hypertrophic cardiomyopathy with no evidence of apical  thrombus. Left ventricular ejection fraction, by estimation, is 60 to 65%.  The left ventricle has normal function. The left ventricle demonstrates  regional wall motion  abnormalities (see scoring diagram/findings for description). There is  severe left ventricular hypertrophy of the apical segment. Left  ventricular diastolic parameters are consistent with Grade I diastolic  dysfunction (impaired relaxation).   2. Right ventricular systolic function is normal. The right ventricular  size is normal. There is moderately elevated pulmonary artery systolic  pressure. The estimated right ventricular systolic pressure is 47.9 mmHg.   3. Left atrial size was mildly dilated.   4. The mitral valve is normal in structure. Mild mitral valve  regurgitation. No evidence of mitral stenosis.   5. Tricuspid valve regurgitation is mild to moderate.   6. Possible fusion of right and non coronary cusp. The aortic valve is  normal in structure. Aortic valve regurgitation is not visualized. Mild to  moderate  aortic valve sclerosis/calcification is present, without any  evidence of aortic stenosis.   7. The inferior vena cava is normal in size with greater than 50%  respiratory variability, suggesting right atrial pressure of 3 mmHg.  Exercise Myoview  07/12/2016: Nuclear stress EF: 64%. Blood pressure demonstrated a normal response to exercise. Horizontal ST segment depression ST segment depression was noted during stress in the III, aVF, V4, V5 and V6 leads, and returning to baseline after 1-5 minutes of recovery. Findings are nonspecific given baseline T wave inversions. The study is normal. This is a low risk study. The left ventricular ejection fraction is normal (55-65%).   EKG:   01/08/2024: Atrial paced, rate 60, nonspecific T wave flattening, QTc 472 10/06/2023: Atrial paced, rate 63, nonspecific T wave flattening 09/12/2022: Atrial paced rhythm, rate 60, poor R wave progression, nonspecific T wave flattening 01/08/2022: Atrial paced, rate 60, poor R wave progression, nonspecific T wave flattening 07/04/2021: Atrial paced, rate 60, Q waves in V1/2, nonspecific T wave flattening 12/27/20: Atrial paced, rate 60, Q waves in V1/2, no ST abnormalities 07/10/2020: EKG is not ordered today. 04/14/2020: sinus rhythm, rate 74, LVH with repolarization abnormalities  Recent Labs: 12/18/2023: Pro Brain Natriuretic Peptide 2,070.0 12/26/2023: B Natriuretic Peptide 763.0; TSH 13.882 01/01/2024: ALT 30; Hemoglobin 13.7; Magnesium  2.7; Platelets 309 01/02/2024: BUN 15; Creatinine, Ser 1.12; Potassium 4.7; Sodium 132  Recent Lipid Panel    Component Value Date/Time   CHOL 207 (H) 12/04/2022 1201   TRIG 72 12/04/2022 1201   HDL 70 12/04/2022 1201   CHOLHDL 3.0 12/04/2022 1201   LDLCALC 124 (H) 12/04/2022 1201    Physical Exam:    VS:  BP (!) 170/80 (BP Location: Left Arm, Patient Position: Sitting, Cuff Size: Normal)   Pulse 60   Ht 5' 2.5 (1.588 m)  Wt 126 lb (57.2 kg)   SpO2 98%   BMI  22.68 kg/m     Wt Readings from Last 3 Encounters:  01/08/24 126 lb (57.2 kg)  01/02/24 122 lb 11.2 oz (55.7 kg)  12/26/23 145 lb 9.6 oz (66 kg)     GEN: in no acute distress HEENT: Normal NECK: no JVD; No carotid bruits CARDIAC: RRR, 2/6 systolic murmur RESPIRATORY: Diminished breath sounds ABDOMEN: Soft, non-tender, non-distended MUSCULOSKELETAL:  no edema; No deformity  SKIN: Warm and dry NEUROLOGIC:  Alert and oriented x 3 PSYCHIATRIC:  Normal affect   ASSESSMENT:    1. Chronic systolic heart failure (HCC)   2. Persistent atrial fibrillation (HCC)   3. Hypertrophic cardiomyopathy (HCC)   4. LV (left ventricular) mural thrombus   5. Hypertension, unspecified type   6. Hyperlipidemia, unspecified hyperlipidemia type        PLAN:    Chronic systolic heart failure: She was admitted 12/5 through 01/02/2024.  Diuresed 20 pounds of fluid.  Suspect atrial fibrillation as etiology of acute systolic heart failure. She underwent successful cardioversion during admission.  She had been on Multaq  but discontinued given heart failure.  EP was consulted and recommended loading with Tikosyn , but unfortunately developed QTc prolongation and Tikosyn  was discontinued.  She was started on amiodarone . - Appears euvolemic.  Continue Jardiance .  Continue Lasix  40 mg as needed.  Will monitor daily weights and take Lasix  if gains more than 3 pounds in 1 day or 5 pounds in 1 week - Continue Toprol -XL 25 mg daily and losartan  25 mg daily  - Check echo prior to next clinic visit  Hypertrophic cardiomyopathy: cardiac MRI on 07/05/2019 showed LV apical hypertrophy measuring up to 15 mm, consistent with apical hypertrophic cardiomyopathy, small apical aneurysm, patchy apical LGE accounting for 1% of total myocardial mass.  No NSVT on Zio patch x 7 days.  Echo 02/11/2022 showed EF 60 to 65%, severe asymmetric LV apical hypertrophy, normal RV function, severe left atrial enlargement, moderate to severe  mitral regurgitation, moderate to severe tricuspid regurgitation.  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  This was developed despite being on Eliquis .  Recommended switching to warfarin -Follows in Coumadin  clinic for INR management  Mitral regurgitation/tricuspid regurgitation: Moderate to severe MR and moderate TR on CMR 03/2022.  Echo 03/2023 showed moderate MR, severe TR. Not a candidate for intervention given age and comorbidities  Persistent atrial fibrillation: Diagnosed 04/2019 after presenting to PCPs office with palpitations. CHA2DS2-VASc score 5.  Zio patch x7 days on 07/14/2019 showed AF burden 47%, with episode lasting 3 days and average rate 118 bpm.  She was admitted in June 2022 with near syncope, underwent PPM for tachybrady syndrome. -Continue warfarin, follows in Coumadin  clinic -Was on amiodarone  200 mg daily.  Transaminitis noted on labs, dose reduced to 100 mg daily, then eventually discontinued.  She was started on Multaq  but discontinued due to systolic heart failure as above.  Tikosyn  was attempted but developed QTc prolongation.  After discussion with EP, recommended trialing amiodarone  again, as unclear if mild transaminitis  was due to amiodarone .  She completes loading dose of amiodarone  today and then will decrease to 100 mg daily.  QTc 472 in clinic today.  Will monitor LFTs  SVT: Frequent episodes on ZIO monitoring, longest lasting 19 seconds.  Continue  Toprol -XL  Hypertension: On Toprol -XL 25 mg daily, losartan  25 mg daily.  BP elevated in clinic today but reports she is enrolled in home BP monitoring program through [facility], sends in her BP every morning and has been in 110s to 120s over 50s to 60s.  Hyperlipidemia: Has been on rosuvastatin  5 mg daily, but held due to transaminitis.  LDL 124 on 12/04/2022  Transaminitis: elevation in liver enzymes noted.  Amiodarone  dose decreased to 100 mg daily and then eventually discontinued.  Rosuvastatin  discontinued.  Liver ultrasound unremarkable.  Referred to hepatology for evaluation.  Transaminitis has improved.  Will monitor while on amiodarone    RTC in 3 months   Medication Adjustments/Labs and Tests Ordered: Current medicines are reviewed at length with the patient today.  Concerns regarding medicines are outlined above.  Orders Placed This Encounter  Procedures   Comprehensive metabolic panel with GFR   TSH + free T4   EKG 12-Lead    Meds ordered this encounter  Medications   losartan  (COZAAR ) 25 MG tablet    Sig: Take 1 tablet (25 mg total) by mouth daily.    Dispense:  90 tablet    Refill:  3   empagliflozin  (JARDIANCE ) 10 MG TABS tablet    Sig: Take 1 tablet (10 mg total) by mouth daily.    Dispense:  90 tablet    Refill:  3      Patient Instructions  Medication Instructions:  No Changes *If you need a refill on your cardiac medications before your next appointment, please call your pharmacy*  Lab Work: CMET (checks kidney and liver function and electrolytes), TSH + Free T4 (to look at Thyroid  Function)  If you have labs (blood work) drawn today and your tests are completely normal, you will receive your results only by: MyChart Message (if you have MyChart) OR A paper copy in the mail If you have any lab test that is abnormal or we need to change your treatment, we will call you to review the results.  Testing/Procedures: None  Follow-Up: At [facility], you and your health needs are our priority.  As part of our continuing mission to provide you with exceptional heart care, our providers are all part of one team.  This team includes your primary Cardiologist (physician) and Advanced Practice Providers or APPs (Physician Assistants and Nurse Practitioners) who all work together to provide you with the care you need, when you need it.  Your next appointment:    04/08/2024 at 3:20 pm  Provider:   [name], MD   Other Instructions Please call us  or send a MyChart message with any Cardiology related questions/concerns.  [phone].  Thank you!   We canceled your 01/29/2024 appointment, per Dr. Nanas.            Signed, [name], MD  01/08/2024 11:21 AM    [facility]

## 2024-01-07 NOTE — Telephone Encounter (Signed)
 Pt of Dr. Kate. The dose of this RX was changed since last O/V by another Provider. Does Dr. Kate want to refill? Please advise.

## 2024-01-08 ENCOUNTER — Ambulatory Visit

## 2024-01-08 ENCOUNTER — Encounter: Payer: Self-pay | Admitting: Cardiology

## 2024-01-08 ENCOUNTER — Ambulatory Visit: Admitting: Cardiology

## 2024-01-08 VITALS — BP 170/80 | HR 60 | Ht 62.5 in | Wt 126.0 lb

## 2024-01-08 DIAGNOSIS — I4819 Other persistent atrial fibrillation: Secondary | ICD-10-CM | POA: Diagnosis not present

## 2024-01-08 DIAGNOSIS — I480 Paroxysmal atrial fibrillation: Secondary | ICD-10-CM | POA: Diagnosis not present

## 2024-01-08 DIAGNOSIS — Z5181 Encounter for therapeutic drug level monitoring: Secondary | ICD-10-CM | POA: Diagnosis not present

## 2024-01-08 DIAGNOSIS — I5022 Chronic systolic (congestive) heart failure: Secondary | ICD-10-CM

## 2024-01-08 DIAGNOSIS — I10 Essential (primary) hypertension: Secondary | ICD-10-CM

## 2024-01-08 DIAGNOSIS — I422 Other hypertrophic cardiomyopathy: Secondary | ICD-10-CM

## 2024-01-08 DIAGNOSIS — I513 Intracardiac thrombosis, not elsewhere classified: Secondary | ICD-10-CM | POA: Diagnosis not present

## 2024-01-08 DIAGNOSIS — E785 Hyperlipidemia, unspecified: Secondary | ICD-10-CM | POA: Diagnosis not present

## 2024-01-08 LAB — COMPREHENSIVE METABOLIC PANEL WITH GFR
ALT: 31 IU/L (ref 0–32)
AST: 45 IU/L — ABNORMAL HIGH (ref 0–40)
Albumin: 4.2 g/dL (ref 3.7–4.7)
Alkaline Phosphatase: 86 IU/L (ref 48–129)
BUN/Creatinine Ratio: 12 (ref 12–28)
BUN: 12 mg/dL (ref 8–27)
Bilirubin Total: 0.4 mg/dL (ref 0.0–1.2)
CO2: 21 mmol/L (ref 20–29)
Calcium: 9 mg/dL (ref 8.7–10.3)
Chloride: 98 mmol/L (ref 96–106)
Creatinine, Ser: 0.98 mg/dL (ref 0.57–1.00)
Globulin, Total: 2.8 g/dL (ref 1.5–4.5)
Glucose: 79 mg/dL (ref 70–99)
Potassium: 4.8 mmol/L (ref 3.5–5.2)
Sodium: 133 mmol/L — ABNORMAL LOW (ref 134–144)
Total Protein: 7 g/dL (ref 6.0–8.5)
eGFR: 56 mL/min/1.73 — ABNORMAL LOW (ref >59)

## 2024-01-08 LAB — TSH+FREE T4
Free T4: 1.82 ng/dL — ABNORMAL HIGH (ref 0.82–1.77)
TSH: 1.74 u[IU]/mL (ref 0.450–4.500)

## 2024-01-08 LAB — POCT INR: POC INR: 2.8

## 2024-01-08 MED ORDER — LOSARTAN POTASSIUM 25 MG PO TABS: 25.0000 mg | ORAL_TABLET | Freq: Every day | ORAL | 3 refills | Status: AC

## 2024-01-08 MED ORDER — EMPAGLIFLOZIN 10 MG PO TABS: 10.0000 mg | ORAL_TABLET | Freq: Every day | ORAL | 3 refills | Status: AC

## 2024-01-08 NOTE — Patient Instructions (Signed)
 Description   INR 2.8 Continue taking warfarin 4mg  daily.  Recheck INR in 3 weeks.  Please call the Coumadin  Clinic at [phone] with any questions

## 2024-01-08 NOTE — Patient Instructions (Signed)
 Medication Instructions:  No Changes *If you need a refill on your cardiac medications before your next appointment, please call your pharmacy*  Lab Work: CMET (checks kidney and liver function and electrolytes), TSH + Free T4 (to look at Thyroid  Function)  If you have labs (blood work) drawn today and your tests are completely normal, you will receive your results only by: MyChart Message (if you have MyChart) OR A paper copy in the mail If you have any lab test that is abnormal or we need to change your treatment, we will call you to review the results.  Testing/Procedures: None  Follow-Up: At [facility], you and your health needs are our priority.  As part of our continuing mission to provide you with exceptional heart care, our providers are all part of one team.  This team includes your primary Cardiologist (physician) and Advanced Practice Providers or APPs (Physician Assistants and Nurse Practitioners) who all work together to provide you with the care you need, when you need it.  Your next appointment:    04/08/2024 at 3:20 pm  Provider:   [name], MD   Other Instructions Please call us  or send a MyChart message with any Cardiology related questions/concerns.  [phone].  Thank you!   We canceled your 01/29/2024 appointment, per Dr. Nanas.

## 2024-01-08 NOTE — Progress Notes (Signed)
 Lab Results  Component Value Date   INR 2.8 01/08/2024   INR 2.3 (H) 01/02/2024   INR 2.2 (H) 01/01/2024    Description   INR 2.8 Continue taking warfarin 4mg  daily.  Recheck INR in 3 weeks.  Please call the Coumadin  Clinic at [phone] with any questions

## 2024-01-09 ENCOUNTER — Ambulatory Visit: Payer: Self-pay | Admitting: Cardiology

## 2024-01-19 ENCOUNTER — Ambulatory Visit (INDEPENDENT_AMBULATORY_CARE_PROVIDER_SITE_OTHER): Payer: Medicare HMO

## 2024-01-19 DIAGNOSIS — I480 Paroxysmal atrial fibrillation: Secondary | ICD-10-CM

## 2024-01-20 LAB — CUP PACEART REMOTE DEVICE CHECK
Battery Remaining Longevity: 77 mo
Battery Remaining Percentage: 71 %
Battery Voltage: 3.02 V
Brady Statistic AP VP Percent: 1 %
Brady Statistic AP VS Percent: 64 %
Brady Statistic AS VP Percent: 1 %
Brady Statistic AS VS Percent: 34 %
Brady Statistic RA Percent Paced: 28 %
Brady Statistic RV Percent Paced: 27 %
Date Time Interrogation Session: 20251229020012
Implantable Lead Connection Status: 753985
Implantable Lead Connection Status: 753985
Implantable Lead Implant Date: 20220627
Implantable Lead Implant Date: 20220627
Implantable Lead Location: 753859
Implantable Lead Location: 753860
Implantable Pulse Generator Implant Date: 20220627
Lead Channel Impedance Value: 390 Ohm
Lead Channel Impedance Value: 430 Ohm
Lead Channel Pacing Threshold Amplitude: 0.75 V
Lead Channel Pacing Threshold Amplitude: 1 V
Lead Channel Pacing Threshold Pulse Width: 0.5 ms
Lead Channel Pacing Threshold Pulse Width: 0.5 ms
Lead Channel Sensing Intrinsic Amplitude: 11.3 mV
Lead Channel Sensing Intrinsic Amplitude: 4.6 mV
Lead Channel Setting Pacing Amplitude: 2 V
Lead Channel Setting Pacing Amplitude: 2.5 V
Lead Channel Setting Pacing Pulse Width: 0.5 ms
Lead Channel Setting Sensing Sensitivity: 2 mV
Pulse Gen Model: 2272
Pulse Gen Serial Number: 3933448

## 2024-01-21 ENCOUNTER — Ambulatory Visit: Payer: Self-pay | Admitting: Internal Medicine

## 2024-01-21 NOTE — Progress Notes (Signed)
 Remote PPM Transmission

## 2024-01-27 ENCOUNTER — Ambulatory Visit (INDEPENDENT_AMBULATORY_CARE_PROVIDER_SITE_OTHER): Admitting: *Deleted

## 2024-01-27 ENCOUNTER — Ambulatory Visit: Attending: Student | Admitting: Student

## 2024-01-27 ENCOUNTER — Encounter: Payer: Self-pay | Admitting: Student

## 2024-01-27 VITALS — BP 122/80 | HR 68 | Ht 62.5 in | Wt 124.6 lb

## 2024-01-27 DIAGNOSIS — I480 Paroxysmal atrial fibrillation: Secondary | ICD-10-CM | POA: Diagnosis not present

## 2024-01-27 DIAGNOSIS — Z7901 Long term (current) use of anticoagulants: Secondary | ICD-10-CM

## 2024-01-27 DIAGNOSIS — Z9189 Other specified personal risk factors, not elsewhere classified: Secondary | ICD-10-CM

## 2024-01-27 DIAGNOSIS — Z79899 Other long term (current) drug therapy: Secondary | ICD-10-CM | POA: Diagnosis not present

## 2024-01-27 DIAGNOSIS — Z5181 Encounter for therapeutic drug level monitoring: Secondary | ICD-10-CM

## 2024-01-27 DIAGNOSIS — I5022 Chronic systolic (congestive) heart failure: Secondary | ICD-10-CM

## 2024-01-27 DIAGNOSIS — I422 Other hypertrophic cardiomyopathy: Secondary | ICD-10-CM | POA: Diagnosis not present

## 2024-01-27 DIAGNOSIS — I513 Intracardiac thrombosis, not elsewhere classified: Secondary | ICD-10-CM | POA: Diagnosis not present

## 2024-01-27 LAB — POCT INR: INR: 2.5 (ref 2.0–3.0)

## 2024-01-27 MED ORDER — AMIODARONE HCL 200 MG PO TABS: 200.0000 mg | ORAL_TABLET | ORAL | 3 refills | Status: AC

## 2024-01-27 NOTE — Patient Instructions (Signed)
 Medication Instructions:  Change amiodarone  200 mg EVERY OTHER DAY (1 WHOLE TABLET) *If you need a refill on your cardiac medications before your next appointment, please call your pharmacy*  Lab Work: CMET, TSH, Free T4 today If you have labs (blood work) drawn today and your tests are completely normal, you will receive your results only by: MyChart Message (if you have MyChart) OR A paper copy in the mail If you have any lab test that is abnormal or we need to change your treatment, we will call you to review the results.  Testing/Procedures: No testing ordered today  Follow-Up: At [facility], you and your health needs are our priority.  As part of our continuing mission to provide you with exceptional heart care, our providers are all part of one team.  This team includes your primary Cardiologist (physician) and Advanced Practice Providers or APPs (Physician Assistants and Nurse Practitioners) who all work together to provide you with the care you need, when you need it.  Your next appointment:   3 month(s)  Provider:   You may see Will Gladis Norton, MD or one of the following Advanced Practice Providers on your designated Care Team:   Charlies Arthur, PA-C Laurabelle Gorczyca Andy Bostyn Bogie, PA-C Suzann Riddle, NP Daphne Barrack, NP    We recommend signing up for the patient portal called MyChart.  Sign up information is provided on this After Visit Summary.  MyChart is used to connect with patients for Virtual Visits (Telemedicine).  Patients are able to view lab/test results, encounter notes, upcoming appointments, etc.  Non-urgent messages can be sent to your provider as well.   To learn more about what you can do with MyChart, go to [email or web address].

## 2024-01-27 NOTE — Patient Instructions (Signed)
 Description   INR 2.5; Continue taking warfarin 4mg  daily.  Recheck INR in 4 weeks.  Please call the Coumadin  Clinic at [phone] with any questions

## 2024-01-27 NOTE — Progress Notes (Signed)
" °  Electrophysiology Office Note:   ID:  Paula [name] [birth year], MRN [id number]  Primary Cardiologist: [name], MD Electrophysiologist: Soyla Gladis Norton, MD      History of Present Illness:   Paula [name] is a 89 y.o. female with h/o paroxysmal atrial fibrillation, tachy-brady syndrome s/p PPM, HFrEF, apical variant hypertrophic cardiomyopathy & hypothyroidism  seen today for post hospital follow up.    Admitted 12/5 - 12/12 in the setting of CHF in the setting of AF. Started on Tikosyn  but had significant QT prolongation and was transitioned to amiodarone .   Since discharge from hospital the patient reports doing very well. No breakthrough arrhythmia of which she is aware. Has been trying to cut her amiodarone  but they have been crumbling. Overall, she denies chest pain, palpitations, dyspnea, PND, orthopnea, nausea, vomiting, dizziness, syncope, edema, weight gain, or early satiety.   Review of systems complete and found to be negative unless listed in HPI.   EP Information / Studies Reviewed:    EKG is ordered today. Personal review as below.  EKG Interpretation Date/Time:  Tuesday January 27 2024 11:21:51 EST Ventricular Rate:  68 PR Interval:  162 QRS Duration:  82 QT Interval:  442 QTC Calculation: 469 R Axis:   19  Text Interpretation: Normal sinus rhythm Confirmed by Lesia Heck ([id number]) on 01/27/2024 11:23:57 AM    PPM Interrogation-  Brief check only. No new AF.   Arrhythmia/Device History Abbott Dual Chamber PPM 07/17/2020 for SSS/Bradycardia   Physical Exam:   VS:  BP 122/80   Pulse 68   Ht 5' 2.5 (1.588 m)   Wt 124 lb 9.6 oz (56.5 kg)   SpO2 99%   BMI 22.43 kg/m    Wt Readings from Last 3 Encounters:  01/27/24 124 lb 9.6 oz (56.5 kg)  01/08/24 126 lb (57.2 kg)  01/02/24 122 lb 11.2 oz (55.7 kg)     GEN: No acute distress  NECK: No JVD; No carotid bruits CARDIAC: Regular rate and rhythm, no murmurs, rubs,  gallops RESPIRATORY:  Clear to auscultation without rales, wheezing or rhonchi  ABDOMEN: Soft, non-tender, non-distended EXTREMITIES:  No edema; No deformity   ASSESSMENT AND PLAN:    Tachy-Brady syndrome s/p Abbott PPM  Normal PPM function by most recent remote and brief check.  Testing not performed.   Persistent Atrial Fibrillation  Secondary hypercoagulable state EKG today shows NSR No further AF by brief device check.  Change amiodarone  to 200 mg every other day so we don't have to cut tablets. Continue coumadin  for CHA2DS2/VASc of at least 5.  Surveillance labs today.     Apical Variant Hypertrophic Cardiomyopathy  Acute Systolic Heart Failure  LV Mural Thrombus  VHD: TV / MV Insufficiency  Follows with Dr. Nanas  Disposition:   Follow up with EP Team in 3 months  Signed, Ozell Prentice Lesia, PA-C  "

## 2024-01-27 NOTE — Progress Notes (Signed)
 Description   INR 2.5; Continue taking warfarin 4mg  daily.  Recheck INR in 4 weeks.  Please call the Coumadin  Clinic at [phone] with any questions

## 2024-01-28 ENCOUNTER — Ambulatory Visit: Payer: Self-pay | Admitting: Student

## 2024-01-28 LAB — TSH: TSH: 2.45 u[IU]/mL (ref 0.450–4.500)

## 2024-01-28 LAB — COMPREHENSIVE METABOLIC PANEL WITH GFR
ALT: 20 IU/L (ref 0–32)
AST: 33 IU/L (ref 0–40)
Albumin: 4.1 g/dL (ref 3.7–4.7)
Alkaline Phosphatase: 67 IU/L (ref 48–129)
BUN/Creatinine Ratio: 13 (ref 12–28)
BUN: 12 mg/dL (ref 8–27)
Bilirubin Total: 0.5 mg/dL (ref 0.0–1.2)
CO2: 23 mmol/L (ref 20–29)
Calcium: 9.4 mg/dL (ref 8.7–10.3)
Chloride: 99 mmol/L (ref 96–106)
Creatinine, Ser: 0.9 mg/dL (ref 0.57–1.00)
Globulin, Total: 2.8 g/dL (ref 1.5–4.5)
Glucose: 84 mg/dL (ref 70–99)
Potassium: 4.6 mmol/L (ref 3.5–5.2)
Sodium: 134 mmol/L (ref 134–144)
Total Protein: 6.9 g/dL (ref 6.0–8.5)
eGFR: 62 mL/min/1.73

## 2024-01-28 LAB — T4, FREE: Free T4: 1.76 ng/dL (ref 0.82–1.77)

## 2024-01-29 ENCOUNTER — Ambulatory Visit: Admitting: Cardiology

## 2024-02-25 ENCOUNTER — Ambulatory Visit: Admitting: Pharmacist

## 2024-02-25 DIAGNOSIS — I480 Paroxysmal atrial fibrillation: Secondary | ICD-10-CM | POA: Diagnosis not present

## 2024-02-25 DIAGNOSIS — Z5181 Encounter for therapeutic drug level monitoring: Secondary | ICD-10-CM | POA: Diagnosis not present

## 2024-02-25 DIAGNOSIS — Z7901 Long term (current) use of anticoagulants: Secondary | ICD-10-CM | POA: Diagnosis not present

## 2024-02-25 LAB — POCT INR: INR: 2.8 (ref 2.0–3.0)

## 2024-02-25 NOTE — Progress Notes (Signed)
 Description   INR 2.8; Continue taking warfarin 4mg  daily.  Recheck INR in 5 weeks.  Please call the Coumadin  Clinic at [phone] with any questions

## 2024-02-25 NOTE — Patient Instructions (Signed)
 Description   INR 2.5; Continue taking warfarin 4mg  daily.  Recheck INR in 5 weeks.  Please call the Coumadin  Clinic at [phone] with any questions

## 2024-03-30 ENCOUNTER — Ambulatory Visit

## 2024-04-08 ENCOUNTER — Ambulatory Visit: Admitting: Cardiology

## 2024-05-05 ENCOUNTER — Ambulatory Visit: Admitting: Student
